# Patient Record
Sex: Male | Born: 1950
Health system: Southern US, Community
[De-identification: ages and names within clinical notes are randomized; demographics above are authoritative.]

## PROBLEM LIST (undated history)

## (undated) DIAGNOSIS — F419 Anxiety disorder, unspecified: Secondary | ICD-10-CM

## (undated) DIAGNOSIS — F32A Depression, unspecified: Secondary | ICD-10-CM

## (undated) DIAGNOSIS — G4700 Insomnia, unspecified: Secondary | ICD-10-CM

## (undated) DIAGNOSIS — B1920 Unspecified viral hepatitis C without hepatic coma: Secondary | ICD-10-CM

## (undated) DIAGNOSIS — S91302A Unspecified open wound, left foot, initial encounter: Secondary | ICD-10-CM

## (undated) DIAGNOSIS — F319 Bipolar disorder, unspecified: Secondary | ICD-10-CM

## (undated) DIAGNOSIS — J189 Pneumonia, unspecified organism: Secondary | ICD-10-CM

## (undated) DIAGNOSIS — L02612 Cutaneous abscess of left foot: Secondary | ICD-10-CM

## (undated) DIAGNOSIS — M1990 Unspecified osteoarthritis, unspecified site: Secondary | ICD-10-CM

## (undated) DIAGNOSIS — M79672 Pain in left foot: Secondary | ICD-10-CM

## (undated) DIAGNOSIS — I10 Essential (primary) hypertension: Secondary | ICD-10-CM

## (undated) DIAGNOSIS — M62838 Other muscle spasm: Secondary | ICD-10-CM

## (undated) DIAGNOSIS — F908 Attention-deficit hyperactivity disorder, other type: Secondary | ICD-10-CM

## (undated) DIAGNOSIS — M009 Pyogenic arthritis, unspecified: Secondary | ICD-10-CM

## (undated) DIAGNOSIS — F329 Major depressive disorder, single episode, unspecified: Secondary | ICD-10-CM

## (undated) DIAGNOSIS — G5793 Unspecified mononeuropathy of bilateral lower limbs: Secondary | ICD-10-CM

## (undated) HISTORY — PX: SINUS EXPLORATION: SHX5214

## (undated) HISTORY — DX: Pain in left foot: M79.672

## (undated) HISTORY — DX: Unspecified open wound, left foot, initial encounter: S91.302A

## (undated) HISTORY — PX: COLONOSCOPY: SHX174

## (undated) HISTORY — DX: Unspecified viral hepatitis C without hepatic coma: B19.20

## (undated) HISTORY — DX: Pyogenic arthritis, unspecified: M00.9

## (undated) HISTORY — DX: Cutaneous abscess of left foot: L02.612

## (undated) HISTORY — DX: Essential (primary) hypertension: I10

---

## 1997-12-28 ENCOUNTER — Ambulatory Visit (HOSPITAL_COMMUNITY): Admission: RE | Admit: 1997-12-28 | Discharge: 1997-12-28 | Payer: Self-pay | Admitting: Gastroenterology

## 1997-12-28 ENCOUNTER — Encounter: Payer: Self-pay | Admitting: Gastroenterology

## 2000-04-08 ENCOUNTER — Ambulatory Visit (HOSPITAL_COMMUNITY): Admission: RE | Admit: 2000-04-08 | Discharge: 2000-04-08 | Payer: Self-pay | Admitting: Internal Medicine

## 2000-04-08 ENCOUNTER — Encounter: Payer: Self-pay | Admitting: Internal Medicine

## 2004-01-21 HISTORY — PX: BACK SURGERY: SHX140

## 2008-11-20 DIAGNOSIS — H04129 Dry eye syndrome of unspecified lacrimal gland: Secondary | ICD-10-CM | POA: Insufficient documentation

## 2010-04-19 DIAGNOSIS — I10 Essential (primary) hypertension: Secondary | ICD-10-CM | POA: Insufficient documentation

## 2010-04-19 DIAGNOSIS — Z8619 Personal history of other infectious and parasitic diseases: Secondary | ICD-10-CM | POA: Insufficient documentation

## 2010-07-12 ENCOUNTER — Encounter (INDEPENDENT_AMBULATORY_CARE_PROVIDER_SITE_OTHER): Payer: Self-pay | Admitting: Surgery

## 2012-05-12 DIAGNOSIS — F1120 Opioid dependence, uncomplicated: Secondary | ICD-10-CM | POA: Insufficient documentation

## 2012-05-12 DIAGNOSIS — M545 Low back pain: Secondary | ICD-10-CM | POA: Insufficient documentation

## 2013-07-05 ENCOUNTER — Other Ambulatory Visit: Payer: Self-pay | Admitting: Nurse Practitioner

## 2013-07-05 DIAGNOSIS — C220 Liver cell carcinoma: Secondary | ICD-10-CM

## 2013-07-12 ENCOUNTER — Ambulatory Visit
Admission: RE | Admit: 2013-07-12 | Discharge: 2013-07-12 | Disposition: A | Discharge status: Home / Self Care | Payer: BC Managed Care – PPO | Source: Ambulatory Visit | Attending: Nurse Practitioner | Admitting: Nurse Practitioner

## 2013-07-12 ENCOUNTER — Encounter (INDEPENDENT_AMBULATORY_CARE_PROVIDER_SITE_OTHER): Payer: Self-pay

## 2013-07-12 DIAGNOSIS — C220 Liver cell carcinoma: Secondary | ICD-10-CM

## 2013-09-14 ENCOUNTER — Encounter: Payer: Self-pay | Admitting: Podiatry

## 2013-09-14 ENCOUNTER — Ambulatory Visit (INDEPENDENT_AMBULATORY_CARE_PROVIDER_SITE_OTHER): Payer: BC Managed Care – PPO | Admitting: Podiatry

## 2013-09-14 VITALS — BP 150/85 | HR 71 | Ht 72.0 in | Wt 234.0 lb

## 2013-09-14 DIAGNOSIS — M86179 Other acute osteomyelitis, unspecified ankle and foot: Secondary | ICD-10-CM

## 2013-09-14 DIAGNOSIS — L02619 Cutaneous abscess of unspecified foot: Secondary | ICD-10-CM | POA: Insufficient documentation

## 2013-09-14 DIAGNOSIS — L03039 Cellulitis of unspecified toe: Principal | ICD-10-CM

## 2013-09-14 NOTE — Patient Instructions (Signed)
Seen for infected toe 2nd left. Running mild fever. Will make referral to Wound care center.  Will start on antibiotics if continues to be feverish.

## 2013-09-14 NOTE — Progress Notes (Signed)
SUBJECTIVE: 63 y.o. year old male presents with red swollen toe 2nd left for duration of 2 months. He does not recall any incident associated with the toe wound. He does not feel any pain or discomfort from it.  He was diagnosed with Neuropathy since 2006 after back surgery. Stated that the surgery took away back pain, but left him with both feet getting numb and tingling, burning and shooting pain.  Stated that he was on antibiotics for 10 days for his toe infection in early July 2015 and the toe made improvement. Then the redness returned a few weeks later. The last medication he took was in early July 2015. Patient relates history of being admitted in hospital in May 2015 because he had symptoms of stroke, but was turned out to be a sinus infection.  At that time he was put on IV antibiotics for two day in May 7- 8 while he was in hospital.  Stated that he did feel feverish two days ago. Today his temperature reading was 99.29F.  REVIEW OF SYSTEMS: Constitutional: negative for Had fever 2 days ago and today's temperature is 99.29F. Eyes: Eyes get red when wakes up in the morning.  Ears, nose, mouth, throat, and face: Sinus problem has came back and taking Flonase and Allegra.  Respiratory: negative Cardiovascular: negative Gastrointestinal: negative Genitourinary:negative Musculoskeletal:Pain at lower back L4-5 and both thumb base joints. Neurological: Peripheral neuropathy following back surgery.   OBJECTIVE: DERMATOLOGIC EXAMINATION: Red swollen toe 2nd left with serosanguinous drainage from a small sinus tract opening at plantar distal end.  VASCULAR EXAMINATION OF LOWER LIMBS: Pedal pulses: All pedal pulses are palpable with normal pulsation.  Red cellulitis limited to 2nd digit left foot.  Mild increase in temperature left foot compared with the right.  NEUROLOGIC EXAMINATION OF THE LOWER LIMBS: Failed response to Monofilament (Semmes-Weinstein 10-gm) sensory testing positive 6 out  of 6, bilateral. Failed Vibratory sensations(128Hz  turning fork) forefoot bilateral.  He takes Neurontin at night to alleviate tingling and pain at bed time.  MUSCULOSKELETAL EXAMINATION: Rectus foot with swollen red toe 2nd digit left. RADIOGRAPHIC FINDINGS: Affected area noted of radiolucent space across the distal phalanx with separated bone fragment at distal end 2nd digit left.   ASSESSMENT: Possible Osteomyelitis 2nd digit distal phalanx left foot.  Cellulitis 2nd digit left.  PLAN: Reviewed findings and available options. Explained the need for IV antibiotics with surgical debridement of infected bone.  Explained that Wound care center might provide him with adequate comprehensive care that he needs at this time.  Patient agreed to follow through with recommendation.  Referral to wound care center initiated.

## 2013-09-16 ENCOUNTER — Ambulatory Visit: Payer: Self-pay | Admitting: Podiatry

## 2013-09-21 ENCOUNTER — Other Ambulatory Visit: Payer: Self-pay | Admitting: Podiatry

## 2013-09-21 ENCOUNTER — Telehealth: Payer: Self-pay | Admitting: *Deleted

## 2013-09-21 MED ORDER — AMOXICILLIN-POT CLAVULANATE 500-125 MG PO TABS: 1.0000 | ORAL_TABLET | Freq: Three times a day (TID) | ORAL | Status: DC

## 2013-09-21 NOTE — Telephone Encounter (Signed)
Patient called this am, he says he has had a on going fever since he was seen in office here on 08/26. He said it did  Get better for a couple days but continues. He also says his Primary took a culture the day before he seen [place] in August but they are no tin this week and he doesn't know what the results were. He said could you give him a antibiotic?

## 2013-10-07 ENCOUNTER — Encounter (HOSPITAL_BASED_OUTPATIENT_CLINIC_OR_DEPARTMENT_OTHER): Payer: BC Managed Care – PPO | Attending: General Surgery

## 2013-10-07 DIAGNOSIS — L02619 Cutaneous abscess of unspecified foot: Secondary | ICD-10-CM | POA: Insufficient documentation

## 2013-10-07 DIAGNOSIS — L97509 Non-pressure chronic ulcer of other part of unspecified foot with unspecified severity: Secondary | ICD-10-CM | POA: Diagnosis not present

## 2013-10-07 DIAGNOSIS — L03039 Cellulitis of unspecified toe: Secondary | ICD-10-CM | POA: Diagnosis not present

## 2013-10-07 DIAGNOSIS — M869 Osteomyelitis, unspecified: Secondary | ICD-10-CM | POA: Insufficient documentation

## 2013-10-08 NOTE — Progress Notes (Signed)
[facility]  NAME:  William Solis, William Solis             ACCOUNT NO.:  [id number]  MEDICAL RECORD NO.:  [id number]      DATE OF BIRTH:  September 05, 1950  PHYSICIAN:  Judene Companion, M.D.      VISIT DATE:  10/07/2013                                  OFFICE VISIT   This is a 63 year old, Caucasian man who has been suffering with a cellulitis and what looks like osteomyelitis of the distal phalanx left second toe.  He has been on antibiotics for about a week and he states that it has been swollen for about 2 months.  He has been diagnosed with neuropathy and that this is probably a neuropathic ulcer.  He does not have diabetes.  He has had a back operation in the past and he says that they felt that this was the reason for his peripheral neuropathy. Today, his blood pressure was 150/70, pulse 70, respirations 16, temperature 98.  He weighs 236 pounds.  I took a culture of the small ulcer on the tip of his toe, and we will make sure that he is on an appropriate antibiotic.  His family doctor has put him on Augmentin and they also did an x-ray which looks to be like he has osteomyelitis of the distal phalanx of his left second toe.  He may be a candidate for a hyperbaric oxygen treatments.  For this time being, we will keep up the Augmentin and will see what the culture shows and we showed him how to change the dressing putting in a wick of iodoform daily after washing it with soap and water.  So, his diagnoses is probable osteomyelitis distal phalanx, left second toe, history of neuropathy.     Judene Companion, M.D.     PP/MEDQ  D:  10/07/2013  T:  10/08/2013  Job:  [id number]

## 2013-10-11 DIAGNOSIS — L97509 Non-pressure chronic ulcer of other part of unspecified foot with unspecified severity: Secondary | ICD-10-CM | POA: Diagnosis not present

## 2013-10-11 DIAGNOSIS — L02619 Cutaneous abscess of unspecified foot: Secondary | ICD-10-CM | POA: Diagnosis not present

## 2013-10-11 DIAGNOSIS — M869 Osteomyelitis, unspecified: Secondary | ICD-10-CM | POA: Diagnosis not present

## 2013-10-11 DIAGNOSIS — L03039 Cellulitis of unspecified toe: Secondary | ICD-10-CM | POA: Diagnosis not present

## 2013-10-19 ENCOUNTER — Inpatient Hospital Stay (HOSPITAL_COMMUNITY)
Admission: EM | Admit: 2013-10-19 | Discharge: 2013-10-24 | DRG: 475 | Disposition: A | Discharge status: Home Health | Payer: BC Managed Care – PPO | Attending: Internal Medicine | Admitting: Internal Medicine

## 2013-10-19 ENCOUNTER — Emergency Department (HOSPITAL_COMMUNITY): Payer: BC Managed Care – PPO

## 2013-10-19 ENCOUNTER — Encounter (HOSPITAL_COMMUNITY): Payer: Self-pay | Admitting: Emergency Medicine

## 2013-10-19 DIAGNOSIS — M86172 Other acute osteomyelitis, left ankle and foot: Secondary | ICD-10-CM | POA: Diagnosis present

## 2013-10-19 DIAGNOSIS — Z87891 Personal history of nicotine dependence: Secondary | ICD-10-CM | POA: Diagnosis not present

## 2013-10-19 DIAGNOSIS — B1920 Unspecified viral hepatitis C without hepatic coma: Secondary | ICD-10-CM | POA: Diagnosis present

## 2013-10-19 DIAGNOSIS — L89622 Pressure ulcer of left heel, stage 2: Secondary | ICD-10-CM | POA: Diagnosis not present

## 2013-10-19 DIAGNOSIS — L89612 Pressure ulcer of right heel, stage 2: Secondary | ICD-10-CM | POA: Diagnosis not present

## 2013-10-19 DIAGNOSIS — Z89422 Acquired absence of other left toe(s): Secondary | ICD-10-CM | POA: Diagnosis not present

## 2013-10-19 DIAGNOSIS — D72829 Elevated white blood cell count, unspecified: Secondary | ICD-10-CM

## 2013-10-19 DIAGNOSIS — G8929 Other chronic pain: Secondary | ICD-10-CM | POA: Diagnosis present

## 2013-10-19 DIAGNOSIS — Z8249 Family history of ischemic heart disease and other diseases of the circulatory system: Secondary | ICD-10-CM | POA: Diagnosis not present

## 2013-10-19 DIAGNOSIS — G629 Polyneuropathy, unspecified: Secondary | ICD-10-CM | POA: Diagnosis present

## 2013-10-19 DIAGNOSIS — M869 Osteomyelitis, unspecified: Secondary | ICD-10-CM

## 2013-10-19 DIAGNOSIS — R7881 Bacteremia: Secondary | ICD-10-CM

## 2013-10-19 DIAGNOSIS — M868X8 Other osteomyelitis, other site: Secondary | ICD-10-CM | POA: Diagnosis not present

## 2013-10-19 DIAGNOSIS — E871 Hypo-osmolality and hyponatremia: Secondary | ICD-10-CM | POA: Diagnosis present

## 2013-10-19 DIAGNOSIS — F909 Attention-deficit hyperactivity disorder, unspecified type: Secondary | ICD-10-CM | POA: Diagnosis present

## 2013-10-19 DIAGNOSIS — M86179 Other acute osteomyelitis, unspecified ankle and foot: Secondary | ICD-10-CM

## 2013-10-19 DIAGNOSIS — L02619 Cutaneous abscess of unspecified foot: Secondary | ICD-10-CM

## 2013-10-19 DIAGNOSIS — B9561 Methicillin susceptible Staphylococcus aureus infection as the cause of diseases classified elsewhere: Secondary | ICD-10-CM | POA: Diagnosis present

## 2013-10-19 DIAGNOSIS — L89894 Pressure ulcer of other site, stage 4: Secondary | ICD-10-CM | POA: Diagnosis not present

## 2013-10-19 DIAGNOSIS — L03032 Cellulitis of left toe: Secondary | ICD-10-CM | POA: Diagnosis present

## 2013-10-19 DIAGNOSIS — L97509 Non-pressure chronic ulcer of other part of unspecified foot with unspecified severity: Secondary | ICD-10-CM | POA: Diagnosis not present

## 2013-10-19 DIAGNOSIS — M79675 Pain in left toe(s): Secondary | ICD-10-CM | POA: Diagnosis present

## 2013-10-19 DIAGNOSIS — L02612 Cutaneous abscess of left foot: Secondary | ICD-10-CM

## 2013-10-19 DIAGNOSIS — E860 Dehydration: Secondary | ICD-10-CM | POA: Diagnosis present

## 2013-10-19 DIAGNOSIS — L03039 Cellulitis of unspecified toe: Secondary | ICD-10-CM

## 2013-10-19 DIAGNOSIS — I10 Essential (primary) hypertension: Secondary | ICD-10-CM | POA: Diagnosis present

## 2013-10-19 DIAGNOSIS — L03116 Cellulitis of left lower limb: Secondary | ICD-10-CM | POA: Diagnosis present

## 2013-10-19 DIAGNOSIS — M549 Dorsalgia, unspecified: Secondary | ICD-10-CM | POA: Diagnosis present

## 2013-10-19 DIAGNOSIS — M86272 Subacute osteomyelitis, left ankle and foot: Secondary | ICD-10-CM | POA: Diagnosis present

## 2013-10-19 DIAGNOSIS — D696 Thrombocytopenia, unspecified: Secondary | ICD-10-CM | POA: Diagnosis present

## 2013-10-19 DIAGNOSIS — F419 Anxiety disorder, unspecified: Secondary | ICD-10-CM | POA: Diagnosis present

## 2013-10-19 DIAGNOSIS — L89154 Pressure ulcer of sacral region, stage 4: Secondary | ICD-10-CM | POA: Diagnosis present

## 2013-10-19 DIAGNOSIS — B958 Unspecified staphylococcus as the cause of diseases classified elsewhere: Secondary | ICD-10-CM | POA: Diagnosis not present

## 2013-10-19 HISTORY — DX: Unspecified mononeuropathy of bilateral lower limbs: G57.93

## 2013-10-19 HISTORY — DX: Attention-deficit hyperactivity disorder, other type: F90.8

## 2013-10-19 LAB — CBC WITH DIFFERENTIAL/PLATELET
BASOS PCT: 0 % (ref 0–1)
Basophils Absolute: 0 10*3/uL (ref 0.0–0.1)
EOS PCT: 2 % (ref 0–5)
Eosinophils Absolute: 0.2 10*3/uL (ref 0.0–0.7)
HEMATOCRIT: 42.9 % (ref 39.0–52.0)
HEMOGLOBIN: 14.7 g/dL (ref 13.0–17.0)
Lymphocytes Relative: 24 % (ref 12–46)
Lymphs Abs: 3 10*3/uL (ref 0.7–4.0)
MCH: 31.3 pg (ref 26.0–34.0)
MCHC: 34.3 g/dL (ref 30.0–36.0)
MCV: 91.5 fL (ref 78.0–100.0)
MONO ABS: 1.9 10*3/uL — AB (ref 0.1–1.0)
MONOS PCT: 15 % — AB (ref 3–12)
NEUTROS ABS: 7.5 10*3/uL (ref 1.7–7.7)
Neutrophils Relative %: 59 % (ref 43–77)
Platelets: 130 10*3/uL — ABNORMAL LOW (ref 150–400)
RBC: 4.69 MIL/uL (ref 4.22–5.81)
RDW: 13.1 % (ref 11.5–15.5)
WBC: 12.7 10*3/uL — ABNORMAL HIGH (ref 4.0–10.5)

## 2013-10-19 LAB — COMPREHENSIVE METABOLIC PANEL
ALBUMIN: 3.2 g/dL — AB (ref 3.5–5.2)
ALT: 89 U/L — ABNORMAL HIGH (ref 0–53)
ANION GAP: 9 (ref 5–15)
AST: 67 U/L — ABNORMAL HIGH (ref 0–37)
Alkaline Phosphatase: 65 U/L (ref 39–117)
BILIRUBIN TOTAL: 1 mg/dL (ref 0.3–1.2)
BUN: 23 mg/dL (ref 6–23)
CALCIUM: 11 mg/dL — AB (ref 8.4–10.5)
CHLORIDE: 97 meq/L (ref 96–112)
CO2: 28 mEq/L (ref 19–32)
CREATININE: 0.98 mg/dL (ref 0.50–1.35)
GFR calc Af Amer: >90 mL/min (ref >90)
GFR calc non Af Amer: 87 mL/min — ABNORMAL LOW (ref >90)
Glucose, Bld: 91 mg/dL (ref 70–99)
Potassium: 4 mEq/L (ref 3.7–5.3)
Sodium: 134 mEq/L — ABNORMAL LOW (ref 137–147)
Total Protein: 7.9 g/dL (ref 6.0–8.3)

## 2013-10-19 LAB — SEDIMENTATION RATE: SED RATE: 30 mm/h — AB (ref 0–16)

## 2013-10-19 LAB — I-STAT CG4 LACTIC ACID, ED: Lactic Acid, Venous: 1.03 mmol/L (ref 0.5–2.2)

## 2013-10-19 MED ORDER — LISINOPRIL 40 MG PO TABS
40.0000 mg | ORAL_TABLET | Freq: Every day | ORAL | Status: DC
  Administered 2013-10-20: 40 mg via ORAL
  Filled 2013-10-19 (×2): qty 1

## 2013-10-19 MED ORDER — CLONAZEPAM 0.5 MG PO TABS: 0.5000 mg | ORAL_TABLET | Freq: Two times a day (BID) | ORAL | Status: DC | PRN

## 2013-10-19 MED ORDER — DOCUSATE SODIUM 100 MG PO CAPS
100.0000 mg | ORAL_CAPSULE | Freq: Two times a day (BID) | ORAL | Status: DC
  Administered 2013-10-19 – 2013-10-24 (×3): 100 mg via ORAL
  Filled 2013-10-19 (×11): qty 1

## 2013-10-19 MED ORDER — AMLODIPINE BESYLATE 10 MG PO TABS
10.0000 mg | ORAL_TABLET | Freq: Every day | ORAL | Status: DC
  Administered 2013-10-20 – 2013-10-24 (×5): 10 mg via ORAL
  Filled 2013-10-19 (×5): qty 1

## 2013-10-19 MED ORDER — HYDROCODONE-ACETAMINOPHEN 5-325 MG PO TABS
1.0000 | ORAL_TABLET | ORAL | Status: DC | PRN
  Administered 2013-10-20: 1 via ORAL
  Administered 2013-10-20: 2 via ORAL
  Filled 2013-10-19 (×3): qty 2

## 2013-10-19 MED ORDER — ACETAMINOPHEN 650 MG RE SUPP: 650.0000 mg | Freq: Four times a day (QID) | RECTAL | Status: DC | PRN

## 2013-10-19 MED ORDER — ACETAMINOPHEN 325 MG PO TABS: 650.0000 mg | ORAL_TABLET | Freq: Four times a day (QID) | ORAL | Status: DC | PRN

## 2013-10-19 MED ORDER — METOPROLOL TARTRATE 50 MG PO TABS
50.0000 mg | ORAL_TABLET | Freq: Four times a day (QID) | ORAL | Status: DC
  Administered 2013-10-19 – 2013-10-20 (×4): 50 mg via ORAL
  Filled 2013-10-19 (×9): qty 1

## 2013-10-19 MED ORDER — ONDANSETRON HCL 4 MG PO TABS: 4.0000 mg | ORAL_TABLET | Freq: Four times a day (QID) | ORAL | Status: DC | PRN

## 2013-10-19 MED ORDER — MELATONIN 5 MG PO TABS: 0.5000 | ORAL_TABLET | Freq: Every evening | ORAL | Status: DC | PRN

## 2013-10-19 MED ORDER — ESCITALOPRAM OXALATE 20 MG PO TABS
30.0000 mg | ORAL_TABLET | Freq: Every day | ORAL | Status: DC
  Administered 2013-10-20 – 2013-10-24 (×5): 30 mg via ORAL
  Filled 2013-10-19 (×5): qty 1

## 2013-10-19 MED ORDER — SODIUM CHLORIDE 0.9 % IV SOLN
INTRAVENOUS | Status: AC
  Administered 2013-10-19: 75 mL/h via INTRAVENOUS

## 2013-10-19 MED ORDER — VANCOMYCIN HCL 10 G IV SOLR
1500.0000 mg | Freq: Once | INTRAVENOUS | Status: AC
  Administered 2013-10-20: 1500 mg via INTRAVENOUS
  Filled 2013-10-19: qty 1500

## 2013-10-19 MED ORDER — GABAPENTIN 300 MG PO CAPS
600.0000 mg | ORAL_CAPSULE | Freq: Every evening | ORAL | Status: DC | PRN
  Filled 2013-10-19: qty 2

## 2013-10-19 MED ORDER — ONDANSETRON HCL 4 MG/2ML IJ SOLN: 4.0000 mg | Freq: Four times a day (QID) | INTRAMUSCULAR | Status: DC | PRN

## 2013-10-19 MED ORDER — CLINDAMYCIN PHOSPHATE 600 MG/50ML IV SOLN
600.0000 mg | Freq: Once | INTRAVENOUS | Status: AC
  Administered 2013-10-19: 600 mg via INTRAVENOUS
  Filled 2013-10-19: qty 50

## 2013-10-19 MED ORDER — OXYCODONE HCL 5 MG PO TABS: 10.0000 mg | ORAL_TABLET | ORAL | Status: DC | PRN

## 2013-10-19 MED ORDER — METHYLPHENIDATE HCL 5 MG PO TABS
20.0000 mg | ORAL_TABLET | Freq: Every day | ORAL | Status: DC
  Administered 2013-10-20 – 2013-10-23 (×3): 20 mg via ORAL
  Filled 2013-10-19 (×5): qty 4

## 2013-10-19 NOTE — ED Provider Notes (Signed)
Medical screening examination/treatment/procedure(s) were performed by non-physician practitioner and as supervising physician I was immediately available for consultation/collaboration.  Leota Jacobsen, MD 10/19/13 [phone]

## 2013-10-19 NOTE — Progress Notes (Signed)
PHARMACIST - PHYSICIAN ORDER COMMUNICATION  CONCERNING: P&T Medication Policy on Herbal Medications  DESCRIPTION:  This patient's order for:  Melatonin  has been noted.  This product(s) is classified as an "herbal" or natural product. Due to a lack of definitive safety studies or FDA approval, nonstandard manufacturing practices, plus the potential risk of unknown drug-drug interactions while on inpatient medications, the Pharmacy and Therapeutics Committee does not permit the use of "herbal" or natural products of this type within [facility].   ACTION TAKEN: The pharmacy department is unable to verify this order at this time and your patient has been informed of this safety policy. Please reevaluate patient's clinical condition at discharge and address if the herbal or natural product(s) should be resumed at that time.  Thank you,  Minda Ditto PharmD Pager [phone] 10/19/2013, 9:38 PM

## 2013-10-19 NOTE — H&P (Signed)
PCP:   Leamon Arnt, MD    Chief Complaint:  Left foot pain and swellng  HPI: William Solis is a 63 y.o. male   has a past medical history of Hypertension; Hepatitis C; Neuropathy involving both lower extremities; and ADHD, adult residual type.   Presented with  3 month hx of left second toe ongoing infection. Patient have been under care of podiatrist he has been prescribe a course of Augment which seemed to control the infection. Patient has been told that he has osteomyelitis.  Augmentin has been stopped on 9/21 in order to obtain a culture. He was seen in the office today by Dr. Jerline Pain and was sent to ER to get IV antibiotics and orthopedics consult. Patient reports low grade fever 100.3. and fatigue. The swelling started to spread up the foot. In ER he received clindamycin IV. Plain films confirmed osteomyelitis.  Of note patient has bilateral neuropathy as a complication of back surgery done 9 years ago at [facility], he has no sensation in his feet. He continued to ambulate and drive.  Cultures done on 09/13/13 showed MSSA  Hospitalist was called for admission for  Osteomyelitis of 2nd toe on left foot.   Review of Systems:    Pertinent positives include:  Fevers, chills, fatigue  Constitutional:  No weight loss, night sweats, weight loss  HEENT:  No headaches, Difficulty swallowing,Tooth/dental problems,Sore throat,  No sneezing, itching, ear ache, nasal congestion, post nasal drip,  Cardio-vascular:  No chest pain, Orthopnea, PND, anasarca, dizziness, palpitations.no Bilateral lower extremity swelling  GI:  No heartburn, indigestion, abdominal pain, nausea, vomiting, diarrhea, change in bowel habits, loss of appetite, melena, blood in stool, hematemesis Resp:  no shortness of breath at rest. No dyspnea on exertion, No excess mucus, no productive cough, No non-productive cough, No coughing up of blood.No change in color of mucus.No wheezing. Skin:  no rash or lesions.  No jaundice GU:  no dysuria, change in color of urine, no urgency or frequency. No straining to urinate.  No flank pain.  Musculoskeletal:  No joint pain or no joint swelling. No decreased range of motion. No back pain.  Psych:  No change in mood or affect. No depression or anxiety. No memory loss.  Neuro: no localizing neurological complaints, no tingling, no weakness, no double vision, no gait abnormality, no slurred speech, no confusion  Otherwise ROS are negative except for above, 10 systems were reviewed  Past Medical History: Past Medical History  Diagnosis Date  . Hypertension   . Hepatitis C   . Neuropathy involving both lower extremities   . ADHD, adult residual type    Past Surgical History  Procedure Laterality Date  . Back surgery  2006    Disc  . Sinus exploration  2005/2007     Medications: Prior to Admission medications   Medication Sig Start Date End Date Taking? Authorizing Provider  amLODipine (NORVASC) 10 MG tablet Take 10 mg by mouth daily.   Yes Historical Provider, MD  amoxicillin-clavulanate (AUGMENTIN) 500-125 MG per tablet Take 1 tablet (500 mg total) by mouth 3 (three) times daily. 09/21/13  Yes Myeong Sheard, DPM  Artificial Tear Ointment (ARTIFICIAL TEARS) ointment Place 1 drop into both eyes as needed (dry eyes).   Yes Historical Provider, MD  Ascorbic Acid (VITAMIN C) 1000 MG tablet Take 1,000 mg by mouth daily.   Yes Historical Provider, MD  clonazePAM (KLONOPIN) 0.5 MG tablet Take 0.5 mg by mouth 2 (two) times daily as  needed for anxiety.   Yes Historical Provider, MD  cyclobenzaprine (FLEXERIL) 10 MG tablet Take 10 mg by mouth 3 (three) times daily as needed for muscle spasms.   Yes Historical Provider, MD  escitalopram (LEXAPRO) 20 MG tablet Take 30 mg by mouth daily.   Yes Historical Provider, MD  fexofenadine (ALLEGRA) 180 MG tablet Take 180 mg by mouth daily.   Yes Historical Provider, MD  fluticasone (FLONASE) 50 MCG/ACT nasal spray Place 1  spray into both nostrils daily.   Yes Historical Provider, MD  gabapentin (NEURONTIN) 600 MG tablet Take 600 mg by mouth at bedtime as needed (pain).    Yes Historical Provider, MD  hydrochlorothiazide (HYDRODIURIL) 25 MG tablet Take 25 mg by mouth daily.   Yes Historical Provider, MD  lisinopril (PRINIVIL,ZESTRIL) 40 MG tablet Take 40 mg by mouth daily.   Yes Historical Provider, MD  Melatonin 5 MG TABS Take 0.5 tablets by mouth at bedtime.    Yes Historical Provider, MD  meloxicam (MOBIC) 7.5 MG tablet Take 7.5 mg by mouth daily.   Yes Historical Provider, MD  methylphenidate (RITALIN) 20 MG tablet Take 20 mg by mouth daily.   Yes Historical Provider, MD  metoprolol (LOPRESSOR) 50 MG tablet Take 50 mg by mouth 4 (four) times daily.   Yes Historical Provider, MD  Multiple Vitamins-Minerals (MULTIVITAMIN WITH MINERALS) tablet Take 1 tablet by mouth daily.   Yes Historical Provider, MD  Omega-3 Fatty Acids (FISH OIL PO) Take 1,200 mg by mouth 2 (two) times daily.    Yes Historical Provider, MD  oxycodone (OXY-IR) 5 MG capsule Take 10 mg by mouth every 4 (four) hours as needed for pain.   Yes Historical Provider, MD    Allergies:  No Known Allergies  Social History:  Ambulatory   independently   Lives at home  With family     reports that he quit smoking about 23 years ago. His smoking use included Cigarettes and Cigars. He smoked 0.00 packs per day. He has never used smokeless tobacco. He reports that he does not drink alcohol or use illicit drugs.    Family History: family history includes Hypertension in his father and mother. There is no history of Diabetes type II.    Physical Exam: Patient Vitals for the past 24 hrs:  BP Temp Temp src Pulse Resp SpO2  10/19/13 1817 - 99.8 F (37.7 C) Rectal - - -  10/19/13 1712 154/72 mmHg 98.3 F (36.8 C) Oral 78 16 96 %  10/19/13 1444 126/80 mmHg 98.5 F (36.9 C) Oral 75 17 92 %    1. General:  in No Acute distress 2. Psychological:  Alert and  Oriented 3. Head/ENT:   Moist   Mucous Membranes                          Head Non traumatic, neck supple                          Normal   Dentition 4. SKIN: normal   Skin turgor,  Skin clean redness and purulent discharge from 2 nd great toe 5. Heart: Regular rate and rhythm no Murmur, Rub or gallop 6. Lungs: Clear to auscultation bilaterally, no wheezes or crackles   7. Abdomen: Soft, non-tender, Non distended 8. Lower extremities: no clubbing, cyanosis, or edema 9. Neurologically Grossly intact, moving all 4 extremities equally 10. MSK: Normal range of motion  body mass index is unknown because there is no weight on file.   Labs on Admission:   Recent Labs  10/19/13 1747  NA 134*  K 4.0  CL 97  CO2 28  GLUCOSE 91  BUN 23  CREATININE 0.98  CALCIUM 11.0*    Recent Labs  10/19/13 1747  AST 67*  ALT 89*  ALKPHOS 65  BILITOT 1.0  PROT 7.9  ALBUMIN 3.2*   No results found for this basename: LIPASE, AMYLASE,  in the last 72 hours  Recent Labs  10/19/13 1747  WBC 12.7*  NEUTROABS 7.5  HGB 14.7  HCT 42.9  MCV 91.5  PLT 130*   No results found for this basename: CKTOTAL, CKMB, CKMBINDEX, TROPONINI,  in the last 72 hours No results found for this basename: TSH, T4TOTAL, FREET3, T3FREE, THYROIDAB,  in the last 72 hours No results found for this basename: VITAMINB12, FOLATE, FERRITIN, TIBC, IRON, RETICCTPCT,  in the last 72 hours No results found for this basename: HGBA1C    The CrCl is unknown because both a height and weight (above a minimum accepted value) are required for this calculation. ABG No results found for this basename: phart, pco2, po2, hco3, tco2, acidbasedef, o2sat     No results found for this basename: DDIMER    Sed Rate 30  Other results:  I have pearsonaly reviewed this:  BNP (last 3 results) No results found for this basename: PROBNP,  in the last 8760 hours  There were no vitals filed for this visit.  Cultures: No  results found for this basename: sdes, specrequest, cult, reptstatus    Radiological Exams on Admission: Dg Toe 2nd Left  10/19/2013   CLINICAL DATA:  Left second toe wound infection for 3 months.  EXAM: LEFT SECOND TOE  COMPARISON:  None.  FINDINGS: The distal phalanx of the second toe is completely destroyed by infection and there is extensive destructive changes involving the middle phalanx also. No definite findings for septic arthritis at the PIP joint. Diffuse and marked soft tissue swelling of the second digit is demonstrated. I do not see any definite air in the soft tissues.  IMPRESSION: Advanced and aggressive osteomyelitis involving the middle and distal phalanges of the second toe.   Electronically Signed   By: Kalman Jewels M.D.   On: 10/19/2013 18:14    Chart has been reviewed  Assessment/Plan  63 year old gentleman with history of neuropathy involving feet bilaterally and hypertension presents with osteomyelitis of the left second toe with evidence of cellulitis and drainage of purulent material worrisome for abscess.  Present on Admission:  . Cellulitis and abscess of toe - start IV antibiotics IV vancomycin, patient already received antibiotics in emergency department. In the past he grew MSSA. The patient have had exposure to medical system since  So will cover for MRSA  . Osteomyelitis of toe of left foot - likely require amputation.  discussed case with orthopedics, NPO post-midnight, IV antibiotics  . hypertension - continue home medications but hold hydrochlorothiazide History of hepatitis C check LFTs and INR noted somewhat low platelet count. We'll repeat.   Prophylaxis: SCD , Protonix  CODE STATUS:  FULL CODE  Other plan as per orders.  I have spent a total of 65 min on this admission - extra time was taken to discuss case with orthopedics, Dr Marcelino Scot has been consulted will see patient in AM.  Gia Lusher 10/19/2013, 7:36 PM  [facility]  Pager  [phone]   If 7AM-7PM,  please contact the day team taking care of the patient  [email or web address]  Password [email or web address]

## 2013-10-19 NOTE — ED Provider Notes (Signed)
CSN: [id number]     Arrival date & time 10/19/13  1433 History   First MD Initiated Contact with Patient 10/19/13 1704     Chief Complaint  Patient presents with  . Wound Infection     (Consider location/radiation/quality/duration/timing/severity/associated sxs/prior Treatment) HPI  William Solis is a 63 y.o. male  with a hx of Hep C, HTN presents to the Emergency Department complaining of gradual, persistent, progressively worsening infection and associated abscess of the Left 2nd toe onset 3 mos ago worsening in the last several weeks.  Pt reports he took 1 course of Augmentin completed 2 weeks ago. Pt reports wound culture grew staph, non MRSA type and he was not placed back on another antibiotic.  Pt reports Hx of peripheral neuropathy and therefore didn't notice the wound.   Pt reports he sees Dr. Caffie Pinto at [facility] - podiatrist.  Associated symptoms include fever (100.3 last night), chills, redness, pain in the foot.  Nothing makes it better and nothing makes it worse.  Pt denies chills, headache, abd pain, N/V, diarrhea, weakness, dizziness, syncope.    PCP: Donnetta Hutching   Past Medical History  Diagnosis Date  . Hypertension   . Hepatitis C   . Neuropathy involving both lower extremities   . ADHD, adult residual type    Past Surgical History  Procedure Laterality Date  . Back surgery  2006    Disc  . Sinus exploration  2005/2007   Family History  Problem Relation Age of Onset  . Hypertension Mother   . Hypertension Father   . Diabetes type II Neg Hx    History  Substance Use Topics  . Smoking status: Former Smoker    Types: Cigarettes, Cigars    Quit date: 10/20/1990  . Smokeless tobacco: Never Used  . Alcohol Use: No    Review of Systems  Constitutional: Positive for fever and fatigue. Negative for diaphoresis, appetite change and unexpected weight change.  HENT: Negative for mouth sores.   Eyes: Negative for visual disturbance.  Respiratory: Negative  for cough, chest tightness, shortness of breath and wheezing.   Cardiovascular: Negative for chest pain.  Gastrointestinal: Negative for nausea, vomiting, abdominal pain, diarrhea and constipation.  Endocrine: Negative for polydipsia, polyphagia and polyuria.  Genitourinary: Negative for dysuria, urgency, frequency and hematuria.  Musculoskeletal: Positive for joint swelling. Negative for back pain and neck stiffness.  Skin: Positive for color change and wound. Negative for rash.  Allergic/Immunologic: Negative for immunocompromised state.  Neurological: Negative for syncope, light-headedness and headaches.  Hematological: Does not bruise/bleed easily.  Psychiatric/Behavioral: Negative for sleep disturbance. The patient is not nervous/anxious.       Allergies  Review of patient's allergies indicates no known allergies.  Home Medications   Prior to Admission medications   Medication Sig Start Date End Date Taking? Authorizing Provider  amLODipine (NORVASC) 10 MG tablet Take 10 mg by mouth daily.   Yes Historical Provider, MD  amoxicillin-clavulanate (AUGMENTIN) 500-125 MG per tablet Take 1 tablet (500 mg total) by mouth 3 (three) times daily. 09/21/13  Yes Myeong Sheard, DPM  Artificial Tear Ointment (ARTIFICIAL TEARS) ointment Place 1 drop into both eyes as needed (dry eyes).   Yes Historical Provider, MD  Ascorbic Acid (VITAMIN C) 1000 MG tablet Take 1,000 mg by mouth daily.   Yes Historical Provider, MD  clonazePAM (KLONOPIN) 0.5 MG tablet Take 0.5 mg by mouth 2 (two) times daily as needed for anxiety.   Yes Historical Provider,  MD  cyclobenzaprine (FLEXERIL) 10 MG tablet Take 10 mg by mouth 3 (three) times daily as needed for muscle spasms.   Yes Historical Provider, MD  escitalopram (LEXAPRO) 20 MG tablet Take 30 mg by mouth daily.   Yes Historical Provider, MD  fexofenadine (ALLEGRA) 180 MG tablet Take 180 mg by mouth daily.   Yes Historical Provider, MD  fluticasone (FLONASE) 50  MCG/ACT nasal spray Place 1 spray into both nostrils daily.   Yes Historical Provider, MD  gabapentin (NEURONTIN) 600 MG tablet Take 600 mg by mouth at bedtime as needed (pain).    Yes Historical Provider, MD  hydrochlorothiazide (HYDRODIURIL) 25 MG tablet Take 25 mg by mouth daily.   Yes Historical Provider, MD  lisinopril (PRINIVIL,ZESTRIL) 40 MG tablet Take 40 mg by mouth daily.   Yes Historical Provider, MD  Melatonin 5 MG TABS Take 0.5 tablets by mouth at bedtime.    Yes Historical Provider, MD  meloxicam (MOBIC) 7.5 MG tablet Take 7.5 mg by mouth daily.   Yes Historical Provider, MD  methylphenidate (RITALIN) 20 MG tablet Take 20 mg by mouth daily.   Yes Historical Provider, MD  metoprolol (LOPRESSOR) 50 MG tablet Take 50 mg by mouth 4 (four) times daily.   Yes Historical Provider, MD  Multiple Vitamins-Minerals (MULTIVITAMIN WITH MINERALS) tablet Take 1 tablet by mouth daily.   Yes Historical Provider, MD  Omega-3 Fatty Acids (FISH OIL PO) Take 1,200 mg by mouth 2 (two) times daily.    Yes Historical Provider, MD  oxycodone (OXY-IR) 5 MG capsule Take 10 mg by mouth every 4 (four) hours as needed for pain.   Yes Historical Provider, MD   BP 154/72  Pulse 78  Temp(Src) 99.8 F (37.7 C) (Rectal)  Resp 16  SpO2 96% Physical Exam  Nursing note and vitals reviewed. Constitutional: He is oriented to person, place, and time. He appears well-developed and well-nourished. No distress.  Awake, alert, nontoxic appearance  HENT:  Head: Normocephalic and atraumatic.  Mouth/Throat: Oropharynx is clear and moist. No oropharyngeal exudate.  Eyes: Conjunctivae are normal. No scleral icterus.  Neck: Normal range of motion. Neck supple.  Cardiovascular: Normal rate, regular rhythm, normal heart sounds and intact distal pulses.   No murmur heard. Pulmonary/Chest: Effort normal and breath sounds normal. No respiratory distress. He has no wheezes.  Equal chest expansion  Abdominal: Soft. Bowel sounds  are normal. He exhibits no distension and no mass. There is no tenderness. There is no rebound and no guarding.  Musculoskeletal: Normal range of motion. He exhibits no edema.  Lymphadenopathy:    He has no cervical adenopathy.  Neurological: He is alert and oriented to person, place, and time.  Speech is clear and goal oriented Moves extremities without ataxia  Skin: Skin is warm and dry. He is not diaphoretic. There is erythema.  Swelling, erythema and abscess collection, several open wounds and drainage to the left 2nd toe with TTP; streaking extends to the proximal calf of the left leg  Psychiatric: He has a normal mood and affect.    ED Course  Procedures (including critical care time) Labs Review Labs Reviewed  CBC WITH DIFFERENTIAL - Abnormal; Notable for the following:    WBC 12.7 (*)    Platelets 130 (*)    Monocytes Relative 15 (*)    Monocytes Absolute 1.9 (*)    All other components within normal limits  COMPREHENSIVE METABOLIC PANEL - Abnormal; Notable for the following:    Sodium 134 (*)  Calcium 11.0 (*)    Albumin 3.2 (*)    AST 67 (*)    ALT 89 (*)    GFR calc non Af Amer 87 (*)    All other components within normal limits  SEDIMENTATION RATE - Abnormal; Notable for the following:    Sed Rate 30 (*)    All other components within normal limits  WOUND CULTURE  CULTURE, BLOOD (ROUTINE X 2)  CULTURE, BLOOD (ROUTINE X 2)  I-STAT CG4 LACTIC ACID, ED    Imaging Review Dg Toe 2nd Left  10/19/2013   CLINICAL DATA:  Left second toe wound infection for 3 months.  EXAM: LEFT SECOND TOE  COMPARISON:  None.  FINDINGS: The distal phalanx of the second toe is completely destroyed by infection and there is extensive destructive changes involving the middle phalanx also. No definite findings for septic arthritis at the PIP joint. Diffuse and marked soft tissue swelling of the second digit is demonstrated. I do not see any definite air in the soft tissues.  IMPRESSION:  Advanced and aggressive osteomyelitis involving the middle and distal phalanges of the second toe.   Electronically Signed   By: Kalman Jewels M.D.   On: 10/19/2013 18:14     EKG Interpretation None            MDM   Final diagnoses:  Cellulitis and abscess of toe, left   William Solis presents with several months of slowing worsening infection in the left 2nd toe. Patient today with abscess the left second toe, concern for possible osteomyelitis. Will obtain labs, wound culture and blood cultures.  6:45PM Pt needed the sepsis criteria. No elevation in his lactic acid. Mild leukocytosis at 12.7.  X-ray with evidence of osteomyelitis. Sedimentation rate pending. We'll proceed with admission. Patient given clindamycin IV here in the emergency department.  7:26 PM Discussed with Dr. Darnell Level who will admit to med-surg.  Elevated sedimentation rate.    The patient was discussed with and seen by Dr. Zenia Resides who agrees with the treatment plan.  BP 154/72  Pulse 78  Temp(Src) 99.8 F (37.7 C) (Rectal)  Resp 16  SpO2 96%       Abigail Butts, PA-C 10/19/13 2021

## 2013-10-19 NOTE — ED Notes (Signed)
Pt has neuropathy and has had toe infection of lt foot since may.  Pt here for IV abx.

## 2013-10-20 ENCOUNTER — Other Ambulatory Visit (HOSPITAL_COMMUNITY): Payer: Self-pay | Admitting: Orthopedic Surgery

## 2013-10-20 DIAGNOSIS — L03032 Cellulitis of left toe: Secondary | ICD-10-CM

## 2013-10-20 DIAGNOSIS — M869 Osteomyelitis, unspecified: Secondary | ICD-10-CM

## 2013-10-20 DIAGNOSIS — L02612 Cutaneous abscess of left foot: Secondary | ICD-10-CM

## 2013-10-20 DIAGNOSIS — I10 Essential (primary) hypertension: Secondary | ICD-10-CM

## 2013-10-20 DIAGNOSIS — D72829 Elevated white blood cell count, unspecified: Secondary | ICD-10-CM

## 2013-10-20 LAB — COMPREHENSIVE METABOLIC PANEL
ALT: 78 U/L — AB (ref 0–53)
AST: 61 U/L — AB (ref 0–37)
Albumin: 2.8 g/dL — ABNORMAL LOW (ref 3.5–5.2)
Alkaline Phosphatase: 62 U/L (ref 39–117)
Anion gap: 9 (ref 5–15)
BUN: 23 mg/dL (ref 6–23)
CALCIUM: 10.7 mg/dL — AB (ref 8.4–10.5)
CO2: 27 mEq/L (ref 19–32)
CREATININE: 0.9 mg/dL (ref 0.50–1.35)
Chloride: 104 mEq/L (ref 96–112)
GFR calc Af Amer: >90 mL/min (ref >90)
Glucose, Bld: 99 mg/dL (ref 70–99)
Potassium: 3.9 mEq/L (ref 3.7–5.3)
SODIUM: 140 meq/L (ref 137–147)
TOTAL PROTEIN: 7.1 g/dL (ref 6.0–8.3)
Total Bilirubin: 0.8 mg/dL (ref 0.3–1.2)

## 2013-10-20 LAB — TSH: TSH: 2.71 u[IU]/mL (ref 0.350–4.500)

## 2013-10-20 LAB — CBC
HCT: 42.1 % (ref 39.0–52.0)
HEMOGLOBIN: 14.3 g/dL (ref 13.0–17.0)
MCH: 30.6 pg (ref 26.0–34.0)
MCHC: 34 g/dL (ref 30.0–36.0)
MCV: 90.1 fL (ref 78.0–100.0)
PLATELETS: 124 10*3/uL — AB (ref 150–400)
RBC: 4.67 MIL/uL (ref 4.22–5.81)
RDW: 13 % (ref 11.5–15.5)
WBC: 7.8 10*3/uL (ref 4.0–10.5)

## 2013-10-20 LAB — PROTIME-INR
INR: 1.08 (ref 0.00–1.49)
Prothrombin Time: 14 seconds (ref 11.6–15.2)

## 2013-10-20 LAB — PHOSPHORUS: Phosphorus: 2 mg/dL — ABNORMAL LOW (ref 2.3–4.6)

## 2013-10-20 LAB — TYPE AND SCREEN
ABO/RH(D): B POS
ANTIBODY SCREEN: NEGATIVE

## 2013-10-20 LAB — ABO/RH: ABO/RH(D): B POS

## 2013-10-20 LAB — MAGNESIUM: Magnesium: 1.8 mg/dL (ref 1.5–2.5)

## 2013-10-20 MED ORDER — OXYCODONE HCL 5 MG PO TABS
10.0000 mg | ORAL_TABLET | ORAL | Status: DC | PRN
  Administered 2013-10-20 – 2013-10-21 (×2): 10 mg via ORAL
  Filled 2013-10-20 (×2): qty 2

## 2013-10-20 MED ORDER — VANCOMYCIN HCL IN DEXTROSE 1-5 GM/200ML-% IV SOLN
1000.0000 mg | Freq: Two times a day (BID) | INTRAVENOUS | Status: DC
  Administered 2013-10-20 – 2013-10-22 (×5): 1000 mg via INTRAVENOUS
  Filled 2013-10-20 (×7): qty 200

## 2013-10-20 MED ORDER — CHLORHEXIDINE GLUCONATE 4 % EX LIQD
60.0000 mL | Freq: Once | CUTANEOUS | Status: DC
  Filled 2013-10-20: qty 60

## 2013-10-20 NOTE — Anesthesia Preprocedure Evaluation (Signed)
Anesthesia Evaluation  Patient identified by MRN, date of birth, ID band Patient awake    Reviewed: Allergy & Precautions, H&P , NPO status , Patient's Chart, lab work & pertinent test results  History of Anesthesia Complications Negative for: history of anesthetic complications  Airway Mallampati: II TM Distance: >3 FB Neck ROM: Full    Dental no notable dental hx.    Pulmonary former smoker,  breath sounds clear to auscultation  Pulmonary exam normal       Cardiovascular Exercise Tolerance: Good hypertension, Pt. on medications and Pt. on home beta blockers Rhythm:Regular Rate:Normal     Neuro/Psych PSYCHIATRIC DISORDERS Anxiety Depression  Neuromuscular disease    GI/Hepatic negative GI ROS, (+) Hepatitis -, C  Endo/Other  negative endocrine ROS  Renal/GU negative Renal ROS  negative genitourinary   Musculoskeletal negative musculoskeletal ROS (+)   Abdominal   Peds negative pediatric ROS (+)  Hematology negative hematology ROS (+)   Anesthesia Other Findings   Reproductive/Obstetrics negative OB ROS                           Anesthesia Physical Anesthesia Plan  ASA: III  Anesthesia Plan: General   Post-op Pain Management:    Induction: Intravenous  Airway Management Planned: LMA  Additional Equipment:   Intra-op Plan:   Post-operative Plan: Extubation in OR  Informed Consent: I have reviewed the patients History and Physical, chart, labs and discussed the procedure including the risks, benefits and alternatives for the proposed anesthesia with the patient or authorized representative who has indicated his/her understanding and acceptance.   Dental advisory given  Plan Discussed with: CRNA  Anesthesia Plan Comments:         Anesthesia Quick Evaluation

## 2013-10-20 NOTE — Progress Notes (Signed)
Report called to RN at [facility] who is receiving patient.  Pt aware of transfer.  Pt to have surgery tomorrow at [facility].

## 2013-10-20 NOTE — Progress Notes (Signed)
Patient ID: William Solis, male   DOB: [birth year], 64 y.o.   MRN: [id number] [facility] PROGRESS NOTE  William Solis BOF:[id number] DOB: 10/08/1950 DOA: 10/19/2013 PCP: Leamon Arnt, MD  Brief narrative: 63 y.o. male with past medical history of hypertension, hepatitis C, neuropathy, ADHD who presented to [facility] ED 10/19/2013 with ongoing left second toe infection for past 3 months prior to this admission. Patient was on Augmentin given to him by podiatrist and he has completed the treatment with it 10/10/2013. He saw Dr. Jerline Pain on the day of this admission and was referred to ED for further treatment with IV antibiotics ad need for orthopedic consultation. Of note, cultures done on 09/13/13 showed MSSA. On this admission, patient had x ray which evealed advanced and aggressive osteomyelitis involving the middle and distal phalanges of the second toe.  Assessment/Plan:    Principal Problem: Acute progressive osteomyelitis of the left second toe / Cellulitis / Leukocytosis   Continue vancomycin started on admission.  Appreciate orthopedic surgery consult and recommendations.  May continue IV fluids for now since patient is NPO prior to potential surgery today,  Pain management efforts: oxycodone 10 mg PO every 4 hours PRN moderate to severe pain. Active Problems: Hypertension  Resumed Norvasc 10 mg daily, lisinopril 40 mg daily and metoprolol 50 mg PO QID.  Renal function stable but would have low threshold for stopping lisinopril since patient will likely require long term vanco for osteomyelitis and both lisinopril and vanco have nephrotoxic potential. Hypercalcemia  Likely due to possible dehydration, acute infection  Continue IV fluids.  Follow up BMP in the morning.  Hyponatremia  Likely dehydration due to acute infection  Sodium normalized with IV fluids. Thrombocytopenia  Mild, platelet count 130, 124.   No signs of bleed.  Using SCD's for DVT  prophylaxis.  DVT Prophylaxis   SCD's bilaterally   Code Status: Full.  Family Communication:  plan of care discussed with the patient Disposition Plan: Home when stable.    IV Access:   Peripheral IV  Procedures and diagnostic studies:    Dg Toe 2nd Left 10/19/2013    Advanced and aggressive osteomyelitis involving the middle and distal phalanges of the second toe.    Medical Consultants:   None   Other Consultants:   None   Anti-Infectives:   Vancomycin 10/19/2013 -->   Leisa Lenz, MD  [facility] Pager [phone]  If 7PM-7AM, please contact night-coverage [email or web address] Password [facility] 10/20/2013, 6:47 AM   LOS: 1 day    HPI/Subjective: No acute overnight events.  Objective: Filed Vitals:   10/19/13 2022 10/19/13 2230 10/20/13 0511 10/20/13 0519  BP: 133/86 132/74 129/76   Pulse: 75 80 66   Temp: 98.4 F (36.9 C) 98.7 F (37.1 C) 98.1 F (36.7 C)   TempSrc: Oral Oral Oral   Resp: 16 18 16    Height:    6' (1.829 m)  Weight:    102.059 kg (225 lb)  SpO2: 99% 98% 99%     Intake/Output Summary (Last 24 hours) at 10/20/13 0647 Last data filed at 10/20/13 0200  Gross per 24 hour  Intake  752.5 ml  Output    700 ml  Net   52.5 ml    Exam:   General:  Pt is alert, follows commands appropriately, not in acute distress  Cardiovascular: Regular rate and rhythm, S1/S2, no murmurs  Respiratory: Clear to auscultation bilaterally, no wheezing, no crackles, no rhonchi  Abdomen: Soft, non tender, non distended,  bowel sounds present  Extremities: Left second toe discoloration with some dry blood, cellulitis extends to upper calf muscle area  Neuro: Grossly nonfocal  Data Reviewed: Basic Metabolic Panel:  Recent Labs Lab 10/19/13 1747 10/20/13 0505  NA 134* 140  K 4.0 3.9  CL 97 104  CO2 28 27  GLUCOSE 91 99  BUN 23 23  CREATININE 0.98 0.90  CALCIUM 11.0* 10.7*  MG  --  1.8  PHOS  --  2.0*   Liver Function Tests:  Recent Labs Lab  10/19/13 1747 10/20/13 0505  AST 67* 61*  ALT 89* 78*  ALKPHOS 65 62  BILITOT 1.0 0.8  PROT 7.9 7.1  ALBUMIN 3.2* 2.8*   No results found for this basename: LIPASE, AMYLASE,  in the last 168 hours No results found for this basename: AMMONIA,  in the last 168 hours CBC:  Recent Labs Lab 10/19/13 1747 10/20/13 0505  WBC 12.7* 7.8  NEUTROABS 7.5  --   HGB 14.7 14.3  HCT 42.9 42.1  MCV 91.5 90.1  PLT 130* 124*   Cardiac Enzymes: No results found for this basename: CKTOTAL, CKMB, CKMBINDEX, TROPONINI,  in the last 168 hours BNP: No components found with this basename: POCBNP,  CBG: No results found for this basename: GLUCAP,  in the last 168 hours  No results found for this or any previous visit (from the past 240 hour(s)).   Scheduled Meds: . amLODipine  10 mg Oral Daily  . docusate sodium  100 mg Oral BID  . escitalopram  30 mg Oral Daily  . lisinopril  40 mg Oral Daily  . methylphenidate  20 mg Oral QAC breakfast  . metoprolol  50 mg Oral QID  . vancomycin  1,000 mg Intravenous Q12H   Continuous Infusions: . sodium chloride 75 mL/hr (10/19/13 2238)

## 2013-10-20 NOTE — Progress Notes (Signed)
ANTIBIOTIC CONSULT NOTE - INITIAL  Pharmacy Consult for Vancomycin Indication: Osteomyelitis  No Known Allergies  Patient Measurements: Height: 6' (182.9 cm) Weight: 225 lb (102.059 kg) IBW/kg (Calculated) : 77.6 Adjusted Body Weight:   Vital Signs: Temp: 98.1 F (36.7 C) (10/01 0511) Temp src: Oral (10/01 0511) BP: 129/76 mmHg (10/01 0511) Pulse Rate: 66 (10/01 0511) Intake/Output from previous day: 09/30 0701 - 10/01 0700 In: 752.5 [I.V.:252.5; IV Piggyback:500] Out: 700 [Urine:700] Intake/Output from this shift: Total I/O In: 752.5 [I.V.:252.5; IV Piggyback:500] Out: 700 [Urine:700]  Labs:  Recent Labs  10/19/13 1747 10/20/13 0505  WBC 12.7* 7.8  HGB 14.7 14.3  PLT 130* 124*  CREATININE 0.98 0.90   Estimated Creatinine Clearance: 106.6 ml/min (by C-G formula based on Cr of 0.9). No results found for this basename: VANCOTROUGH, VANCOPEAK, VANCORANDOM, GENTTROUGH, GENTPEAK, GENTRANDOM, TOBRATROUGH, TOBRAPEAK, TOBRARND, AMIKACINPEAK, AMIKACINTROU, AMIKACIN,  in the last 72 hours   Microbiology: No results found for this or any previous visit (from the past 720 hour(s)).  Medical History: Past Medical History  Diagnosis Date  . Hypertension   . Hepatitis C   . Neuropathy involving both lower extremities   . ADHD, adult residual type     Medications:  Anti-infectives   Start     Dose/Rate Route Frequency Ordered Stop   10/20/13 1000  vancomycin (VANCOCIN) IVPB 1000 mg/200 mL premix     1,000 mg 200 mL/hr over 60 Minutes Intravenous Every 12 hours 10/20/13 0639     10/19/13 2230  vancomycin (VANCOCIN) 1,500 mg in sodium chloride 0.9 % 500 mL IVPB     1,500 mg 250 mL/hr over 120 Minutes Intravenous  Once 10/19/13 2220 10/20/13 0206   10/19/13 1730  clindamycin (CLEOCIN) IVPB 600 mg     600 mg 100 mL/hr over 30 Minutes Intravenous  Once 10/19/13 1726 10/19/13 1837     Assessment: Patient with Osteomyelitis.  First dose of antibiotics already given.    Goal of Therapy:  Vancomycin trough level 15-20 mcg/ml  Plan:  Measure antibiotic drug levels at steady state Follow up culture results Vancomycin 1gm iv q12hr  Tyler Deis, Shea Stakes [place] 10/20/2013,6:40 AM

## 2013-10-20 NOTE — Consult Note (Signed)
Reason for Consult: Osteomyelitis cellulitis left foot second toe Referring Physician: Dr. Mickle Plumb is an 63 y.o. male.  HPI: Patient is a 63 year old gentleman with insensate neuropathy to his left lower extremity secondary to previous spinal surgery. Patient presents with chronic osteomyelitis and cellulitis of the second toe with cellulitis extending up the leg on the left  Past Medical History  Diagnosis Date  . Hypertension   . Hepatitis C   . Neuropathy involving both lower extremities   . ADHD, adult residual type     Past Surgical History  Procedure Laterality Date  . Back surgery  2006    Disc  . Sinus exploration  2005/2007    Family History  Problem Relation Age of Onset  . Hypertension Mother   . Hypertension Father   . Diabetes type II Neg Hx     Social History:  reports that he quit smoking about 23 years ago. His smoking use included Cigarettes and Cigars. He smoked 0.00 packs per day. He has never used smokeless tobacco. He reports that he does not drink alcohol or use illicit drugs.  Allergies: No Known Allergies  Medications: I have reviewed the patient's current medications.  Results for orders placed during the hospital encounter of 10/19/13 (from the past 48 hour(s))  CBC WITH DIFFERENTIAL     Status: Abnormal   Collection Time    10/19/13  5:47 PM      Result Value Ref Range   WBC 12.7 (*) 4.0 - 10.5 K/uL   RBC 4.69  4.22 - 5.81 MIL/uL   Hemoglobin 14.7  13.0 - 17.0 g/dL   HCT 42.9  39.0 - 52.0 %   MCV 91.5  78.0 - 100.0 fL   MCH 31.3  26.0 - 34.0 pg   MCHC 34.3  30.0 - 36.0 g/dL   RDW 13.1  11.5 - 15.5 %   Platelets 130 (*) 150 - 400 K/uL   Neutrophils Relative % 59  43 - 77 %   Neutro Abs 7.5  1.7 - 7.7 K/uL   Lymphocytes Relative 24  12 - 46 %   Lymphs Abs 3.0  0.7 - 4.0 K/uL   Monocytes Relative 15 (*) 3 - 12 %   Monocytes Absolute 1.9 (*) 0.1 - 1.0 K/uL   Eosinophils Relative 2  0 - 5 %   Eosinophils Absolute 0.2  0.0 -  0.7 K/uL   Basophils Relative 0  0 - 1 %   Basophils Absolute 0.0  0.0 - 0.1 K/uL  COMPREHENSIVE METABOLIC PANEL     Status: Abnormal   Collection Time    10/19/13  5:47 PM      Result Value Ref Range   Sodium 134 (*) 137 - 147 mEq/L   Potassium 4.0  3.7 - 5.3 mEq/L   Chloride 97  96 - 112 mEq/L   CO2 28  19 - 32 mEq/L   Glucose, Bld 91  70 - 99 mg/dL   BUN 23  6 - 23 mg/dL   Creatinine, Ser 0.98  0.50 - 1.35 mg/dL   Calcium 11.0 (*) 8.4 - 10.5 mg/dL   Total Protein 7.9  6.0 - 8.3 g/dL   Albumin 3.2 (*) 3.5 - 5.2 g/dL   AST 67 (*) 0 - 37 U/L   ALT 89 (*) 0 - 53 U/L   Alkaline Phosphatase 65  39 - 117 U/L   Total Bilirubin 1.0  0.3 - 1.2 mg/dL  GFR calc non Af Amer 87 (*) >90 mL/min   GFR calc Af Amer >90  >90 mL/min   Comment: (NOTE)     The eGFR has been calculated using the CKD EPI equation.     This calculation has not been validated in all clinical situations.     eGFR's persistently <90 mL/min signify possible Chronic Kidney     Disease.   Anion gap 9  5 - 15  SEDIMENTATION RATE     Status: Abnormal   Collection Time    10/19/13  5:47 PM      Result Value Ref Range   Sed Rate 30 (*) 0 - 16 mm/hr  I-STAT CG4 LACTIC ACID, ED     Status: None   Collection Time    10/19/13  6:10 PM      Result Value Ref Range   Lactic Acid, Venous 1.03  0.5 - 2.2 mmol/L  ABO/RH     Status: None   Collection Time    10/20/13  5:00 AM      Result Value Ref Range   ABO/RH(D) B POS    MAGNESIUM     Status: None   Collection Time    10/20/13  5:05 AM      Result Value Ref Range   Magnesium 1.8  1.5 - 2.5 mg/dL  PHOSPHORUS     Status: Abnormal   Collection Time    10/20/13  5:05 AM      Result Value Ref Range   Phosphorus 2.0 (*) 2.3 - 4.6 mg/dL  COMPREHENSIVE METABOLIC PANEL     Status: Abnormal   Collection Time    10/20/13  5:05 AM      Result Value Ref Range   Sodium 140  137 - 147 mEq/L   Potassium 3.9  3.7 - 5.3 mEq/L   Chloride 104  96 - 112 mEq/L   CO2 27  19 - 32  mEq/L   Glucose, Bld 99  70 - 99 mg/dL   BUN 23  6 - 23 mg/dL   Creatinine, Ser 0.90  0.50 - 1.35 mg/dL   Calcium 10.7 (*) 8.4 - 10.5 mg/dL   Total Protein 7.1  6.0 - 8.3 g/dL   Albumin 2.8 (*) 3.5 - 5.2 g/dL   AST 61 (*) 0 - 37 U/L   ALT 78 (*) 0 - 53 U/L   Alkaline Phosphatase 62  39 - 117 U/L   Total Bilirubin 0.8  0.3 - 1.2 mg/dL   GFR calc non Af Amer >90  >90 mL/min   GFR calc Af Amer >90  >90 mL/min   Comment: (NOTE)     The eGFR has been calculated using the CKD EPI equation.     This calculation has not been validated in all clinical situations.     eGFR's persistently <90 mL/min signify possible Chronic Kidney     Disease.   Anion gap 9  5 - 15  CBC     Status: Abnormal   Collection Time    10/20/13  5:05 AM      Result Value Ref Range   WBC 7.8  4.0 - 10.5 K/uL   RBC 4.67  4.22 - 5.81 MIL/uL   Hemoglobin 14.3  13.0 - 17.0 g/dL   HCT 42.1  39.0 - 52.0 %   MCV 90.1  78.0 - 100.0 fL   MCH 30.6  26.0 - 34.0 pg   MCHC 34.0  30.0 - 36.0 g/dL  RDW 13.0  11.5 - 15.5 %   Platelets 124 (*) 150 - 400 K/uL  PROTIME-INR     Status: None   Collection Time    10/20/13  5:05 AM      Result Value Ref Range   Prothrombin Time 14.0  11.6 - 15.2 seconds   INR 1.08  0.00 - 1.49  TYPE AND SCREEN     Status: None   Collection Time    10/20/13  5:05 AM      Result Value Ref Range   ABO/RH(D) B POS     Antibody Screen NEG     Sample Expiration 10/23/2013      Dg Toe 2nd Left  10/19/2013   CLINICAL DATA:  Left second toe wound infection for 3 months.  EXAM: LEFT SECOND TOE  COMPARISON:  None.  FINDINGS: The distal phalanx of the second toe is completely destroyed by infection and there is extensive destructive changes involving the middle phalanx also. No definite findings for septic arthritis at the PIP joint. Diffuse and marked soft tissue swelling of the second digit is demonstrated. I do not see any definite air in the soft tissues.  IMPRESSION: Advanced and aggressive  osteomyelitis involving the middle and distal phalanges of the second toe.   Electronically Signed   By: Kalman Jewels M.D.   On: 10/19/2013 18:14    Review of Systems  All other systems reviewed and are negative.  Blood pressure 129/76, pulse 66, temperature 98.1 F (36.7 C), temperature source Oral, resp. rate 16, height 6' (1.829 m), weight 102.059 kg (225 lb), SpO2 99.00%. Physical Exam On examination there is been significant reduction the cellulitis to the left leg and left foot. Patient has a good dorsalis pedis pulse. Patient had sausage digit swelling with cellulitis of the left second toe. Radiograph shows complete destruction of the distal phalanx secondary to chronic osteomyelitis. Assessment/Plan: Assessment: Chronic osteomyelitis with insensate neuropathy of the left foot second toe with resolving cellulitis.  Plan: We will plan for a second ray amputation tomorrow. Risks and benefits of surgery were discussed patient states he understands and wishes to proceed at this time. Plan for surgery Friday afternoon.  Saabir Blyth V 10/20/2013, 7:02 AM

## 2013-10-21 ENCOUNTER — Encounter (HOSPITAL_COMMUNITY): Payer: BC Managed Care – PPO | Admitting: Anesthesiology

## 2013-10-21 ENCOUNTER — Inpatient Hospital Stay (HOSPITAL_COMMUNITY)
Admission: RE | Admit: 2013-10-21 | Payer: BC Managed Care – PPO | Source: Ambulatory Visit | Admitting: Orthopedic Surgery

## 2013-10-21 ENCOUNTER — Inpatient Hospital Stay (HOSPITAL_COMMUNITY): Payer: BC Managed Care – PPO | Admitting: Anesthesiology

## 2013-10-21 ENCOUNTER — Encounter (HOSPITAL_BASED_OUTPATIENT_CLINIC_OR_DEPARTMENT_OTHER): Payer: BC Managed Care – PPO | Attending: General Surgery

## 2013-10-21 ENCOUNTER — Encounter (HOSPITAL_COMMUNITY): Payer: Self-pay | Admitting: Certified Registered Nurse Anesthetist

## 2013-10-21 ENCOUNTER — Encounter (HOSPITAL_COMMUNITY)
Admission: EM | Disposition: A | Discharge status: Home Health | Payer: Self-pay | Source: Home / Self Care | Attending: Internal Medicine

## 2013-10-21 DIAGNOSIS — L89622 Pressure ulcer of left heel, stage 2: Secondary | ICD-10-CM | POA: Insufficient documentation

## 2013-10-21 DIAGNOSIS — L89154 Pressure ulcer of sacral region, stage 4: Secondary | ICD-10-CM | POA: Insufficient documentation

## 2013-10-21 DIAGNOSIS — L89612 Pressure ulcer of right heel, stage 2: Secondary | ICD-10-CM | POA: Insufficient documentation

## 2013-10-21 DIAGNOSIS — L89894 Pressure ulcer of other site, stage 4: Secondary | ICD-10-CM | POA: Insufficient documentation

## 2013-10-21 HISTORY — PX: AMPUTATION: SHX166

## 2013-10-21 LAB — SURGICAL PCR SCREEN
MRSA, PCR: NEGATIVE
Staphylococcus aureus: POSITIVE — AB

## 2013-10-21 SURGERY — AMPUTATION, FOOT, RAY
Anesthesia: General | Laterality: Left

## 2013-10-21 SURGERY — AMPUTATION, FOOT, RAY
Anesthesia: General | Site: Toe | Laterality: Left

## 2013-10-21 MED ORDER — FENTANYL CITRATE 0.05 MG/ML IJ SOLN
INTRAMUSCULAR | Status: DC | PRN
  Administered 2013-10-21: 100 ug via INTRAVENOUS

## 2013-10-21 MED ORDER — DOCUSATE SODIUM 100 MG PO CAPS
100.0000 mg | ORAL_CAPSULE | Freq: Two times a day (BID) | ORAL | Status: DC
  Administered 2013-10-23: 100 mg via ORAL
  Filled 2013-10-21 (×6): qty 1

## 2013-10-21 MED ORDER — PIPERACILLIN-TAZOBACTAM 3.375 G IVPB
3.3750 g | INTRAVENOUS | Status: AC
  Administered 2013-10-21: 3.375 g via INTRAVENOUS
  Filled 2013-10-21: qty 50

## 2013-10-21 MED ORDER — 0.9 % SODIUM CHLORIDE (POUR BTL) OPTIME
TOPICAL | Status: DC | PRN
  Administered 2013-10-21: 1000 mL

## 2013-10-21 MED ORDER — OXYCODONE-ACETAMINOPHEN 5-325 MG PO TABS
1.0000 | ORAL_TABLET | ORAL | Status: DC | PRN
  Administered 2013-10-21 – 2013-10-23 (×6): 2 via ORAL
  Administered 2013-10-23: 1 via ORAL
  Administered 2013-10-23 – 2013-10-24 (×2): 2 via ORAL
  Filled 2013-10-21: qty 1
  Filled 2013-10-21 (×8): qty 2

## 2013-10-21 MED ORDER — LIDOCAINE HCL (CARDIAC) 20 MG/ML IV SOLN
INTRAVENOUS | Status: DC | PRN
  Administered 2013-10-21: 100 mg via INTRAVENOUS

## 2013-10-21 MED ORDER — PHENYLEPHRINE HCL 10 MG/ML IJ SOLN
INTRAMUSCULAR | Status: DC | PRN
  Administered 2013-10-21: 80 ug via INTRAVENOUS

## 2013-10-21 MED ORDER — MIDAZOLAM HCL 5 MG/5ML IJ SOLN
INTRAMUSCULAR | Status: DC | PRN
  Administered 2013-10-21: 2 mg via INTRAVENOUS

## 2013-10-21 MED ORDER — ONDANSETRON HCL 4 MG PO TABS: 4.0000 mg | ORAL_TABLET | Freq: Four times a day (QID) | ORAL | Status: DC | PRN

## 2013-10-21 MED ORDER — DEXTROSE 5 % IV SOLN
500.0000 mg | Freq: Four times a day (QID) | INTRAVENOUS | Status: DC | PRN
  Filled 2013-10-21: qty 5

## 2013-10-21 MED ORDER — SODIUM CHLORIDE 0.9 % IV SOLN: INTRAVENOUS | Status: DC

## 2013-10-21 MED ORDER — ONDANSETRON HCL 4 MG/2ML IJ SOLN
INTRAMUSCULAR | Status: DC | PRN
  Administered 2013-10-21: 4 mg via INTRAVENOUS

## 2013-10-21 MED ORDER — METHOCARBAMOL 500 MG PO TABS
500.0000 mg | ORAL_TABLET | Freq: Four times a day (QID) | ORAL | Status: DC | PRN
Start: 2013-10-21 — End: 2013-10-24

## 2013-10-21 MED ORDER — ONDANSETRON HCL 4 MG/2ML IJ SOLN
4.0000 mg | Freq: Four times a day (QID) | INTRAMUSCULAR | Status: DC | PRN
Start: 2013-10-21 — End: 2013-10-24

## 2013-10-21 MED ORDER — PROPOFOL 10 MG/ML IV BOLUS
INTRAVENOUS | Status: DC | PRN
  Administered 2013-10-21: 200 mg via INTRAVENOUS

## 2013-10-21 MED ORDER — METOCLOPRAMIDE HCL 10 MG PO TABS: 5.0000 mg | ORAL_TABLET | Freq: Three times a day (TID) | ORAL | Status: DC | PRN

## 2013-10-21 MED ORDER — PIPERACILLIN-TAZOBACTAM 3.375 G IVPB
3.3750 g | Freq: Three times a day (TID) | INTRAVENOUS | Status: DC
  Administered 2013-10-21 – 2013-10-22 (×2): 3.375 g via INTRAVENOUS
  Filled 2013-10-21 (×4): qty 50

## 2013-10-21 MED ORDER — METOPROLOL TARTRATE 50 MG PO TABS
50.0000 mg | ORAL_TABLET | Freq: Two times a day (BID) | ORAL | Status: DC
  Administered 2013-10-21 – 2013-10-24 (×7): 50 mg via ORAL
  Filled 2013-10-21 (×8): qty 1

## 2013-10-21 MED ORDER — ENOXAPARIN SODIUM 40 MG/0.4ML ~~LOC~~ SOLN
40.0000 mg | SUBCUTANEOUS | Status: DC
  Administered 2013-10-21 – 2013-10-23 (×3): 40 mg via SUBCUTANEOUS
  Filled 2013-10-21 (×4): qty 0.4

## 2013-10-21 MED ORDER — HYDROMORPHONE HCL 1 MG/ML IJ SOLN: 0.5000 mg | INTRAMUSCULAR | Status: DC | PRN

## 2013-10-21 MED ORDER — METOCLOPRAMIDE HCL 5 MG/ML IJ SOLN: 5.0000 mg | Freq: Three times a day (TID) | INTRAMUSCULAR | Status: DC | PRN

## 2013-10-21 MED ORDER — EPHEDRINE SULFATE 50 MG/ML IJ SOLN
INTRAMUSCULAR | Status: DC | PRN
  Administered 2013-10-21: 10 mg via INTRAVENOUS

## 2013-10-21 MED ORDER — LACTATED RINGERS IV SOLN
INTRAVENOUS | Status: DC | PRN
  Administered 2013-10-21: 13:00:00 via INTRAVENOUS

## 2013-10-21 MED ORDER — HYDROMORPHONE HCL 1 MG/ML IJ SOLN: 0.2500 mg | INTRAMUSCULAR | Status: DC | PRN

## 2013-10-21 SURGICAL SUPPLY — 30 items
BLADE LONG MED 31MMX9MM (MISCELLANEOUS) ×1
BLADE LONG MED 31X9 (MISCELLANEOUS) ×2 IMPLANT
BLADE SAW SGTL MED 73X18.5 STR (BLADE) ×3 IMPLANT
BNDG COHESIVE 4X5 TAN STRL (GAUZE/BANDAGES/DRESSINGS) ×3 IMPLANT
BNDG GAUZE ELAST 4 BULKY (GAUZE/BANDAGES/DRESSINGS) ×6 IMPLANT
COVER SURGICAL LIGHT HANDLE (MISCELLANEOUS) ×3 IMPLANT
DRAPE U-SHAPE 47X51 STRL (DRAPES) ×6 IMPLANT
DRSG ADAPTIC 3X8 NADH LF (GAUZE/BANDAGES/DRESSINGS) ×3 IMPLANT
DRSG PAD ABDOMINAL 8X10 ST (GAUZE/BANDAGES/DRESSINGS) ×6 IMPLANT
DURAPREP 26ML APPLICATOR (WOUND CARE) ×3 IMPLANT
ELECT REM PT RETURN 9FT ADLT (ELECTROSURGICAL) ×3
ELECTRODE REM PT RTRN 9FT ADLT (ELECTROSURGICAL) ×1 IMPLANT
GAUZE SPONGE 4X4 12PLY STRL (GAUZE/BANDAGES/DRESSINGS) ×3 IMPLANT
GLOVE BIOGEL PI IND STRL 9 (GLOVE) ×1 IMPLANT
GLOVE BIOGEL PI INDICATOR 9 (GLOVE) ×2
GLOVE SURG ORTHO 9.0 STRL STRW (GLOVE) ×3 IMPLANT
GOWN STRL REUS W/ TWL XL LVL3 (GOWN DISPOSABLE) ×2 IMPLANT
GOWN STRL REUS W/TWL XL LVL3 (GOWN DISPOSABLE) ×4
KIT BASIN OR (CUSTOM PROCEDURE TRAY) ×3 IMPLANT
KIT ROOM TURNOVER OR (KITS) ×3 IMPLANT
NS IRRIG 1000ML POUR BTL (IV SOLUTION) ×3 IMPLANT
PACK ORTHO EXTREMITY (CUSTOM PROCEDURE TRAY) ×3 IMPLANT
PAD ARMBOARD 7.5X6 YLW CONV (MISCELLANEOUS) ×6 IMPLANT
SPONGE LAP 18X18 X RAY DECT (DISPOSABLE) ×3 IMPLANT
STOCKINETTE IMPERVIOUS LG (DRAPES) IMPLANT
SUT ETHILON 2 0 PSLX (SUTURE) ×6 IMPLANT
TOWEL OR 17X24 6PK STRL BLUE (TOWEL DISPOSABLE) ×3 IMPLANT
TOWEL OR 17X26 10 PK STRL BLUE (TOWEL DISPOSABLE) ×3 IMPLANT
UNDERPAD 30X30 INCONTINENT (UNDERPADS AND DIAPERS) ×3 IMPLANT
WATER STERILE IRR 1000ML POUR (IV SOLUTION) IMPLANT

## 2013-10-21 NOTE — Progress Notes (Signed)
Utilization review completed.  

## 2013-10-21 NOTE — Transfer of Care (Signed)
Immediate Anesthesia Transfer of Care Note  Patient: William Solis  Procedure(s) Performed: Procedure(s): LEFT FOOT SECOND RAY AMPUTATION  (Left)  Patient Location: PACU  Anesthesia Type:General  Level of Consciousness: awake, alert , oriented, patient cooperative and responds to stimulation  Airway & Oxygen Therapy: Patient Spontanous Breathing and Patient connected to nasal cannula oxygen  Post-op Assessment: Report given to PACU RN, Post -op Vital signs reviewed and stable and Patient moving all extremities X 4  Post vital signs: Reviewed and stable  Complications: No apparent anesthesia complications

## 2013-10-21 NOTE — Progress Notes (Signed)
[facility] PROGRESS NOTE  William Solis MVH:[id number] DOB: 05/01/1950 DOA: 10/19/2013 PCP: Leamon Arnt, MD  Assessment/Plan: 63 y.o. male with PMH of HTN, Hep C, s/p spinal fusion, peripheral neuropathy, who presented to [facility] ED 10/19/2013 with ongoing left second toe infection for past 3 months prior to this admission. Patient was on Augmentin given to him by podiatrist and he has completed the treatment with it 10/10/2013. -admitted with cellulitis, osteomyelitis   1. Acute osteomyelitis of the left second toe / Cellulitis / Leukocytosis  -cont IV atx, add gr"-" perio op; pain control;  -scheduled for 2nd toe amputation on 10/2 per ortho   2. Hypertension; cont Norvasc 10 mg daily, hold lisinopril 40 mg periop; change metoprolol 50 mg BID;  3. Hypercalcemia Likely due to possible dehydration, acute infection +bone destruction+HCTZ  -cont IV fluids., Follow up BMP in the morning.  3. Thrombocytopenia; Mild, platelet count 130, 124.; no signs of bleed.    DVT Prophylaxis start SQ lovenox   Code Status: full Family Communication:  D/w patient,  (indicate person spoken with, relationship, and if by phone, the number) Disposition Plan: home pend surgery    Consultants:  ortho  Procedures:  Pend surgery   Antibiotics:  vanc 9/30<<<<   (indicate start date, and stop date if known)  HPI/Subjective: alert  Objective: Filed Vitals:   10/21/13 0627  BP: 130/70  Pulse: 65  Temp: 97.7 F (36.5 C)  Resp:     Intake/Output Summary (Last 24 hours) at 10/21/13 0943 Last data filed at 10/21/13 8413  Gross per 24 hour  Intake      0 ml  Output      0 ml  Net      0 ml   Filed Weights   10/20/13 0519  Weight: 102.059 kg (225 lb)    Exam:   General:  alert  Cardiovascular: s1,s2 rrr  Respiratory: CTA BL  Abdomen: soft, nt,nd  Musculoskeletal: cellulitis of the second toe with cellulitis extending up the leg on the left     Data Reviewed: Basic  Metabolic Panel:  Recent Labs Lab 10/19/13 1747 10/20/13 0505  NA 134* 140  K 4.0 3.9  CL 97 104  CO2 28 27  GLUCOSE 91 99  BUN 23 23  CREATININE 0.98 0.90  CALCIUM 11.0* 10.7*  MG  --  1.8  PHOS  --  2.0*   Liver Function Tests:  Recent Labs Lab 10/19/13 1747 10/20/13 0505  AST 67* 61*  ALT 89* 78*  ALKPHOS 65 62  BILITOT 1.0 0.8  PROT 7.9 7.1  ALBUMIN 3.2* 2.8*   No results found for this basename: LIPASE, AMYLASE,  in the last 168 hours No results found for this basename: AMMONIA,  in the last 168 hours CBC:  Recent Labs Lab 10/19/13 1747 10/20/13 0505  WBC 12.7* 7.8  NEUTROABS 7.5  --   HGB 14.7 14.3  HCT 42.9 42.1  MCV 91.5 90.1  PLT 130* 124*   Cardiac Enzymes: No results found for this basename: CKTOTAL, CKMB, CKMBINDEX, TROPONINI,  in the last 168 hours BNP (last 3 results) No results found for this basename: PROBNP,  in the last 8760 hours CBG: No results found for this basename: GLUCAP,  in the last 168 hours  Recent Results (from the past 240 hour(s))  CULTURE, BLOOD (ROUTINE X 2)     Status: None   Collection Time    10/19/13  5:47 PM  Result Value Ref Range Status   Specimen Description BLOOD RIGHT ANTECUBITAL   Final   Special Requests BOTTLES DRAWN AEROBIC AND ANAEROBIC 5ML   Final   Culture  Setup Time     Final   Value: 10/19/2013 20:20     Performed at [facility]   Culture     Final   Value: GRAM POSITIVE COCCI IN CLUSTERS     Note: Gram Stain Report Called to,Read Back By and Verified With: BRIANA RICHARDSON @ 6295 ON 100115 BY [facility]     Performed at [facility]   Report Status PENDING   Incomplete  CULTURE, BLOOD (ROUTINE X 2)     Status: None   Collection Time    10/19/13  5:56 PM      Result Value Ref Range Status   Specimen Description BLOOD LEFT ANTECUBITAL   Final   Special Requests BOTTLES DRAWN AEROBIC AND ANAEROBIC 5ML   Final   Culture  Setup Time     Final   Value: 10/19/2013 20:20      Performed at [facility]   Culture     Final   Value:        BLOOD CULTURE RECEIVED NO GROWTH TO DATE CULTURE WILL BE HELD FOR 5 DAYS BEFORE ISSUING A FINAL NEGATIVE REPORT     Performed at [facility]   Report Status PENDING   Incomplete  WOUND CULTURE     Status: None   Collection Time    10/19/13  6:01 PM      Result Value Ref Range Status   Specimen Description FOOT   Final   Special Requests Normal   Final   Gram Stain     Final   Value: ABUNDANT WBC PRESENT,BOTH PMN AND MONONUCLEAR     NO SQUAMOUS EPITHELIAL CELLS SEEN     RARE GRAM POSITIVE COCCI     IN PAIRS IN CLUSTERS     Performed at [facility]   Culture     Final   Value: MODERATE STAPHYLOCOCCUS AUREUS     Note: RIFAMPIN AND GENTAMICIN SHOULD NOT BE USED AS SINGLE DRUGS FOR TREATMENT OF STAPH INFECTIONS.     Performed at [facility]   Report Status PENDING   Incomplete     Studies: Dg Toe 2nd Left  10/19/2013   CLINICAL DATA:  Left second toe wound infection for 3 months.  EXAM: LEFT SECOND TOE  COMPARISON:  None.  FINDINGS: The distal phalanx of the second toe is completely destroyed by infection and there is extensive destructive changes involving the middle phalanx also. No definite findings for septic arthritis at the PIP joint. Diffuse and marked soft tissue swelling of the second digit is demonstrated. I do not see any definite air in the soft tissues.  IMPRESSION: Advanced and aggressive osteomyelitis involving the middle and distal phalanges of the second toe.   Electronically Signed   By: Kalman Jewels M.D.   On: 10/19/2013 18:14    Scheduled Meds: . amLODipine  10 mg Oral Daily  . chlorhexidine  60 mL Topical Once  . docusate sodium  100 mg Oral BID  . escitalopram  30 mg Oral Daily  . lisinopril  40 mg Oral Daily  . methylphenidate  20 mg Oral QAC breakfast  . metoprolol  50 mg Oral QID  . vancomycin  1,000 mg Intravenous Q12H   Continuous Infusions:   Active  Problems:   Cellulitis  and abscess of toe   Osteomyelitis of toe of left foot   Osteomyelitis    Time spent: >35 minutes     Kinnie Feil  [facility] Pager [phone]. If 7PM-7AM, please contact night-coverage at [email or web address], password [facility] 10/21/2013, 9:43 AM  LOS: 2 days

## 2013-10-21 NOTE — Progress Notes (Signed)
Orthopedic Tech Progress Note Patient Details:  William Solis [birth year] [id number] Fit for size and put on shelf until use with PT Ortho Devices Type of Ortho Device: Postop shoe/boot Ortho Device/Splint Location: LLE Ortho Device/Splint Interventions: Application   Asia R Thompson 10/21/2013, 4:00 PM

## 2013-10-21 NOTE — Interval H&P Note (Signed)
History and Physical Interval Note:  10/21/2013 6:32 AM  William Solis  has presented today for surgery, with the diagnosis of ostomyelitis left 2nd toe  The various methods of treatment have been discussed with the patient and family. After consideration of risks, benefits and other options for treatment, the patient has consented to  Procedure(s): LEFT FOOT 2ND RAY AMPUTATION  (Left) as a surgical intervention .  The patient's history has been reviewed, patient examined, no change in status, stable for surgery.  I have reviewed the patient's chart and labs.  Questions were answered to the patient's satisfaction.     DUDA,MARCUS V

## 2013-10-21 NOTE — H&P (View-Only) (Signed)
Reason for Consult: Osteomyelitis cellulitis left foot second toe Referring Physician: Dr. Audrie Lia is an 63 y.o. male.  HPI: Patient is a 63 year old gentleman with insensate neuropathy to his left lower extremity secondary to previous spinal surgery. Patient presents with chronic osteomyelitis and cellulitis of the second toe with cellulitis extending up the leg on the left  Past Medical History  Diagnosis Date  . Hypertension   . Hepatitis C   . Neuropathy involving both lower extremities   . ADHD, adult residual type     Past Surgical History  Procedure Laterality Date  . Back surgery  2006    Disc  . Sinus exploration  2005/2007    Family History  Problem Relation Age of Onset  . Hypertension Mother   . Hypertension Father   . Diabetes type II Neg Hx     Social History:  reports that he quit smoking about 23 years ago. His smoking use included Cigarettes and Cigars. He smoked 0.00 packs per day. He has never used smokeless tobacco. He reports that he does not drink alcohol or use illicit drugs.  Allergies: No Known Allergies  Medications: I have reviewed the patient's current medications.  Results for orders placed during the hospital encounter of 10/19/13 (from the past 48 hour(s))  CBC WITH DIFFERENTIAL     Status: Abnormal   Collection Time    10/19/13  5:47 PM      Result Value Ref Range   WBC 12.7 (*) 4.0 - 10.5 K/uL   RBC 4.69  4.22 - 5.81 MIL/uL   Hemoglobin 14.7  13.0 - 17.0 g/dL   HCT 53.7  37.3 - 54.6 %   MCV 91.5  78.0 - 100.0 fL   MCH 31.3  26.0 - 34.0 pg   MCHC 34.3  30.0 - 36.0 g/dL   RDW 14.9  93.1 - 93.6 %   Platelets 130 (*) 150 - 400 K/uL   Neutrophils Relative % 59  43 - 77 %   Neutro Abs 7.5  1.7 - 7.7 K/uL   Lymphocytes Relative 24  12 - 46 %   Lymphs Abs 3.0  0.7 - 4.0 K/uL   Monocytes Relative 15 (*) 3 - 12 %   Monocytes Absolute 1.9 (*) 0.1 - 1.0 K/uL   Eosinophils Relative 2  0 - 5 %   Eosinophils Absolute 0.2  0.0 -  0.7 K/uL   Basophils Relative 0  0 - 1 %   Basophils Absolute 0.0  0.0 - 0.1 K/uL  COMPREHENSIVE METABOLIC PANEL     Status: Abnormal   Collection Time    10/19/13  5:47 PM      Result Value Ref Range   Sodium 134 (*) 137 - 147 mEq/L   Potassium 4.0  3.7 - 5.3 mEq/L   Chloride 97  96 - 112 mEq/L   CO2 28  19 - 32 mEq/L   Glucose, Bld 91  70 - 99 mg/dL   BUN 23  6 - 23 mg/dL   Creatinine, Ser 5.83  0.50 - 1.35 mg/dL   Calcium 64.9 (*) 8.4 - 10.5 mg/dL   Total Protein 7.9  6.0 - 8.3 g/dL   Albumin 3.2 (*) 3.5 - 5.2 g/dL   AST 67 (*) 0 - 37 U/L   ALT 89 (*) 0 - 53 U/L   Alkaline Phosphatase 65  39 - 117 U/L   Total Bilirubin 1.0  0.3 - 1.2 mg/dL  GFR calc non Af Amer 87 (*) >90 mL/min   GFR calc Af Amer >90  >90 mL/min   Comment: (NOTE)     The eGFR has been calculated using the CKD EPI equation.     This calculation has not been validated in all clinical situations.     eGFR's persistently <90 mL/min signify possible Chronic Kidney     Disease.   Anion gap 9  5 - 15  SEDIMENTATION RATE     Status: Abnormal   Collection Time    10/19/13  5:47 PM      Result Value Ref Range   Sed Rate 30 (*) 0 - 16 mm/hr  I-STAT CG4 LACTIC ACID, ED     Status: None   Collection Time    10/19/13  6:10 PM      Result Value Ref Range   Lactic Acid, Venous 1.03  0.5 - 2.2 mmol/L  ABO/RH     Status: None   Collection Time    10/20/13  5:00 AM      Result Value Ref Range   ABO/RH(D) B POS    MAGNESIUM     Status: None   Collection Time    10/20/13  5:05 AM      Result Value Ref Range   Magnesium 1.8  1.5 - 2.5 mg/dL  PHOSPHORUS     Status: Abnormal   Collection Time    10/20/13  5:05 AM      Result Value Ref Range   Phosphorus 2.0 (*) 2.3 - 4.6 mg/dL  COMPREHENSIVE METABOLIC PANEL     Status: Abnormal   Collection Time    10/20/13  5:05 AM      Result Value Ref Range   Sodium 140  137 - 147 mEq/L   Potassium 3.9  3.7 - 5.3 mEq/L   Chloride 104  96 - 112 mEq/L   CO2 27  19 - 32  mEq/L   Glucose, Bld 99  70 - 99 mg/dL   BUN 23  6 - 23 mg/dL   Creatinine, Ser 0.90  0.50 - 1.35 mg/dL   Calcium 10.7 (*) 8.4 - 10.5 mg/dL   Total Protein 7.1  6.0 - 8.3 g/dL   Albumin 2.8 (*) 3.5 - 5.2 g/dL   AST 61 (*) 0 - 37 U/L   ALT 78 (*) 0 - 53 U/L   Alkaline Phosphatase 62  39 - 117 U/L   Total Bilirubin 0.8  0.3 - 1.2 mg/dL   GFR calc non Af Amer >90  >90 mL/min   GFR calc Af Amer >90  >90 mL/min   Comment: (NOTE)     The eGFR has been calculated using the CKD EPI equation.     This calculation has not been validated in all clinical situations.     eGFR's persistently <90 mL/min signify possible Chronic Kidney     Disease.   Anion gap 9  5 - 15  CBC     Status: Abnormal   Collection Time    10/20/13  5:05 AM      Result Value Ref Range   WBC 7.8  4.0 - 10.5 K/uL   RBC 4.67  4.22 - 5.81 MIL/uL   Hemoglobin 14.3  13.0 - 17.0 g/dL   HCT 42.1  39.0 - 52.0 %   MCV 90.1  78.0 - 100.0 fL   MCH 30.6  26.0 - 34.0 pg   MCHC 34.0  30.0 - 36.0 g/dL  RDW 13.0  11.5 - 15.5 %   Platelets 124 (*) 150 - 400 K/uL  PROTIME-INR     Status: None   Collection Time    10/20/13  5:05 AM      Result Value Ref Range   Prothrombin Time 14.0  11.6 - 15.2 seconds   INR 1.08  0.00 - 1.49  TYPE AND SCREEN     Status: None   Collection Time    10/20/13  5:05 AM      Result Value Ref Range   ABO/RH(D) B POS     Antibody Screen NEG     Sample Expiration 10/23/2013      Dg Toe 2nd Left  10/19/2013   CLINICAL DATA:  Left second toe wound infection for 3 months.  EXAM: LEFT SECOND TOE  COMPARISON:  None.  FINDINGS: The distal phalanx of the second toe is completely destroyed by infection and there is extensive destructive changes involving the middle phalanx also. No definite findings for septic arthritis at the PIP joint. Diffuse and marked soft tissue swelling of the second digit is demonstrated. I do not see any definite air in the soft tissues.  IMPRESSION: Advanced and aggressive  osteomyelitis involving the middle and distal phalanges of the second toe.   Electronically Signed   By: Kalman Jewels M.D.   On: 10/19/2013 18:14    Review of Systems  All other systems reviewed and are negative.  Blood pressure 129/76, pulse 66, temperature 98.1 F (36.7 C), temperature source Oral, resp. rate 16, height 6' (1.829 m), weight 102.059 kg (225 lb), SpO2 99.00%. Physical Exam On examination there is been significant reduction the cellulitis to the left leg and left foot. Patient has a good dorsalis pedis pulse. Patient had sausage digit swelling with cellulitis of the left second toe. Radiograph shows complete destruction of the distal phalanx secondary to chronic osteomyelitis. Assessment/Plan: Assessment: Chronic osteomyelitis with insensate neuropathy of the left foot second toe with resolving cellulitis.  Plan: We will plan for a second ray amputation tomorrow. Risks and benefits of surgery were discussed patient states he understands and wishes to proceed at this time. Plan for surgery Friday afternoon.  Ayven Glasco V 10/20/2013, 7:02 AM

## 2013-10-21 NOTE — Progress Notes (Signed)
ANTIBIOTIC CONSULT NOTE - INITIAL  Pharmacy Consult for Zosyn; already on vancomycin Indication: cellulitis, osteo of L 2nd toe  No Known Allergies  Patient Measurements: Height: 6' (182.9 cm) Weight: 225 lb (102.059 kg) IBW/kg (Calculated) : 77.6  Vital Signs: Temp: 97.7 F (36.5 C) (10/02 0627) Temp Source: Oral (10/02 0627) BP: 150/79 mmHg (10/02 0954) Pulse Rate: 65 (10/02 0627) Intake/Output from previous day:   Intake/Output from this shift:    Labs:  Recent Labs  10/19/13 1747 10/20/13 0505  WBC 12.7* 7.8  HGB 14.7 14.3  PLT 130* 124*  CREATININE 0.98 0.90   Estimated Creatinine Clearance: 106.6 ml/min (by C-G formula based on Cr of 0.9). No results found for this basename: VANCOTROUGH, VANCOPEAK, VANCORANDOM, GENTTROUGH, GENTPEAK, GENTRANDOM, TOBRATROUGH, TOBRAPEAK, TOBRARND, AMIKACINPEAK, AMIKACINTROU, AMIKACIN,  in the last 72 hours   Microbiology: Recent Results (from the past 720 hour(s))  CULTURE, BLOOD (ROUTINE X 2)     Status: None   Collection Time    10/19/13  5:47 PM      Result Value Ref Range Status   Specimen Description BLOOD RIGHT ANTECUBITAL   Final   Special Requests BOTTLES DRAWN AEROBIC AND ANAEROBIC 5ML   Final   Culture  Setup Time     Final   Value: 10/19/2013 20:20     Performed at [facility]   Culture     Final   Value: GRAM POSITIVE COCCI IN CLUSTERS     Note: Gram Stain Report Called to,Read Back By and Verified With: BRIANA RICHARDSON @ [place] ON [address]115 BY [place]     Performed at [facility]   Report Status PENDING   Incomplete  CULTURE, BLOOD (ROUTINE X 2)     Status: None   Collection Time    10/19/13  5:56 PM      Result Value Ref Range Status   Specimen Description BLOOD LEFT ANTECUBITAL   Final   Special Requests BOTTLES DRAWN AEROBIC AND ANAEROBIC 5ML   Final   Culture  Setup Time     Final   Value: 10/19/2013 20:20     Performed at [facility]   Culture     Final   Value:        BLOOD  CULTURE RECEIVED NO GROWTH TO DATE CULTURE WILL BE HELD FOR 5 DAYS BEFORE ISSUING A FINAL NEGATIVE REPORT     Performed at [facility]   Report Status PENDING   Incomplete  WOUND CULTURE     Status: None   Collection Time    10/19/13  6:01 PM      Result Value Ref Range Status   Specimen Description FOOT   Final   Special Requests Normal   Final   Gram Stain     Final   Value: ABUNDANT WBC PRESENT,BOTH PMN AND MONONUCLEAR     NO SQUAMOUS EPITHELIAL CELLS SEEN     RARE GRAM POSITIVE COCCI     IN PAIRS IN CLUSTERS     Performed at [facility]   Culture     Final   Value: MODERATE STAPHYLOCOCCUS AUREUS     Note: RIFAMPIN AND GENTAMICIN SHOULD NOT BE USED AS SINGLE DRUGS FOR TREATMENT OF STAPH INFECTIONS.     Performed at [facility]   Report Status PENDING   Incomplete    Medical History: Past Medical History  Diagnosis Date  . Hypertension   . Hepatitis C   . Neuropathy involving both  lower extremities   . ADHD, adult residual type     Medications:  Prescriptions prior to admission  Medication Sig Dispense Refill  . amLODipine (NORVASC) 10 MG tablet Take 10 mg by mouth daily.      Marland Kitchen amoxicillin-clavulanate (AUGMENTIN) 500-125 MG per tablet Take 1 tablet (500 mg total) by mouth 3 (three) times daily.  45 tablet  0  . Artificial Tear Ointment (ARTIFICIAL TEARS) ointment Place 1 drop into both eyes as needed (dry eyes).      . Ascorbic Acid (VITAMIN C) 1000 MG tablet Take 1,000 mg by mouth daily.      . clonazePAM (KLONOPIN) 0.5 MG tablet Take 0.5 mg by mouth 2 (two) times daily as needed for anxiety.      . cyclobenzaprine (FLEXERIL) 10 MG tablet Take 10 mg by mouth 3 (three) times daily as needed for muscle spasms.      Marland Kitchen escitalopram (LEXAPRO) 20 MG tablet Take 30 mg by mouth daily.      . fexofenadine (ALLEGRA) 180 MG tablet Take 180 mg by mouth daily.      . fluticasone (FLONASE) 50 MCG/ACT nasal spray Place 1 spray into both nostrils daily.       Marland Kitchen gabapentin (NEURONTIN) 600 MG tablet Take 600 mg by mouth at bedtime as needed (pain).       . hydrochlorothiazide (HYDRODIURIL) 25 MG tablet Take 25 mg by mouth daily.      Marland Kitchen lisinopril (PRINIVIL,ZESTRIL) 40 MG tablet Take 40 mg by mouth daily.      . Melatonin 5 MG TABS Take 0.5 tablets by mouth at bedtime.       . meloxicam (MOBIC) 7.5 MG tablet Take 7.5 mg by mouth daily.      . methylphenidate (RITALIN) 20 MG tablet Take 20 mg by mouth daily.      . metoprolol (LOPRESSOR) 50 MG tablet Take 50 mg by mouth 4 (four) times daily.      . Multiple Vitamins-Minerals (MULTIVITAMIN WITH MINERALS) tablet Take 1 tablet by mouth daily.      . Omega-3 Fatty Acids (FISH OIL PO) Take 1,200 mg by mouth 2 (two) times daily.       Marland Kitchen oxycodone (OXY-IR) 5 MG capsule Take 10 mg by mouth every 4 (four) hours as needed for pain.       Assessment: 63 y/o male with neuropathy who presented to the [facility] ED on 9/30 for L toe infection since May. He failed outpatient treatment with Augmentin. Plan is for amputation of L 2nd toe today. Pharmacy consulted to add Zosyn for more broad coverage. He is afebrile, WBC are normal, and renal function is stable. Wound culture reveals moderate Staph aureus. One blood culture with GPC clusters.  Goal of Therapy:  Vancomycin trough level 15-20 mcg/ml Eradication of infection  Plan:  - Zosyn 3.375 g IV q8h to be infused over 4 hours - Continue vancomycin 1000 mg IV q12h - Steady-state trough if continued post-op - Monitor renal function, clinical course, and culture data  [facility], Pharm.D., BCPS Clinical Pharmacist Pager: [phone] 10/21/2013 10:08 AM

## 2013-10-21 NOTE — Op Note (Signed)
10/19/2013 - 10/21/2013  1:18 PM  PATIENT:  William Solis    PRE-OPERATIVE DIAGNOSIS:  ostomyelitis left second toe  POST-OPERATIVE DIAGNOSIS:  Same  PROCEDURE:  LEFT FOOT SECOND RAY AMPUTATION   SURGEON:  [name], MD  PHYSICIAN ASSISTANT:None ANESTHESIA:   General  PREOPERATIVE INDICATIONS:  [name] is a  63 y.o. male with a diagnosis of ostomyelitis left second toe who failed conservative measures and elected for surgical management.    The risks benefits and alternatives were discussed with the patient preoperatively including but not limited to the risks of infection, bleeding, nerve injury, cardiopulmonary complications, the need for revision surgery, among others, and the patient was willing to proceed.  OPERATIVE IMPLANTS: None  OPERATIVE FINDINGS: No deep abscess purulence or necrotic tissue  OPERATIVE PROCEDURE: Patient is a 63 year old gentleman with insensate neuropathy he does not have diabetes with chronic osteomyelitis of the left foot second toe patient presents at this time for ray amputation. Risks and benefits were discussed including risk of recurrent infection risk of the wound nonhealing. Patient states he understands and wishes to proceed at this time. Description of procedure patient was brought to the operating room and underwent a general anesthetic. After adequate levels and anesthesia were obtained patient's left lower extremity was prepped using DuraPrep draped into a sterile field. A fishmouth incision was made dorsally over the MTP joint the second ray was amputated through the base of the second metatarsal the wound is irrigated with normal saline hemostasis was obtained. The incision was closed using 2-0 nylon. Wound is covered with a sterile compressive dressing. Followup in the office in one week.

## 2013-10-21 NOTE — Anesthesia Postprocedure Evaluation (Signed)
  Anesthesia Post-op Note  Patient: William Solis  Procedure(s) Performed: Procedure(s): LEFT FOOT SECOND RAY AMPUTATION  (Left)  Patient Location: PACU  Anesthesia Type:General  Level of Consciousness: awake  Airway and Oxygen Therapy: Patient Spontanous Breathing  Post-op Pain: mild  Post-op Assessment: Post-op Vital signs reviewed  Post-op Vital Signs: Reviewed  Last Vitals:  Filed Vitals:   10/21/13 1434  BP: 127/68  Pulse: 63  Temp: 36.9 C  Resp: 14    Complications: No apparent anesthesia complications

## 2013-10-22 DIAGNOSIS — G8929 Other chronic pain: Secondary | ICD-10-CM

## 2013-10-22 DIAGNOSIS — B1920 Unspecified viral hepatitis C without hepatic coma: Secondary | ICD-10-CM

## 2013-10-22 DIAGNOSIS — Z89422 Acquired absence of other left toe(s): Secondary | ICD-10-CM

## 2013-10-22 DIAGNOSIS — M549 Dorsalgia, unspecified: Secondary | ICD-10-CM

## 2013-10-22 DIAGNOSIS — B958 Unspecified staphylococcus as the cause of diseases classified elsewhere: Secondary | ICD-10-CM

## 2013-10-22 DIAGNOSIS — G629 Polyneuropathy, unspecified: Secondary | ICD-10-CM

## 2013-10-22 DIAGNOSIS — M868X8 Other osteomyelitis, other site: Secondary | ICD-10-CM

## 2013-10-22 LAB — CULTURE, BLOOD (ROUTINE X 2)

## 2013-10-22 LAB — WOUND CULTURE: Special Requests: NORMAL

## 2013-10-22 LAB — HIV ANTIBODY (ROUTINE TESTING W REFLEX): HIV: NONREACTIVE

## 2013-10-22 MED ORDER — SODIUM CHLORIDE 0.9 % IJ SOLN
10.0000 mL | INTRAMUSCULAR | Status: DC | PRN
  Administered 2013-10-23: 10 mL

## 2013-10-22 MED ORDER — CEFAZOLIN SODIUM-DEXTROSE 2-3 GM-% IV SOLR
2.0000 g | Freq: Three times a day (TID) | INTRAVENOUS | Status: DC
  Administered 2013-10-22 – 2013-10-24 (×7): 2 g via INTRAVENOUS
  Filled 2013-10-22 (×11): qty 50

## 2013-10-22 NOTE — Progress Notes (Signed)
[facility] PROGRESS NOTE  William Solis ASN:[id number] DOB: 01/06/1951 DOA: 10/19/2013 PCP: Leamon Arnt, MD  Assessment/Plan: 63 y.o. male with PMH of HTN, Hep C, s/p spinal fusion, peripheral neuropathy, who presented to [facility] ED 10/19/2013 with ongoing left second toe infection for past 3 months prior to this admission. Patient was on Augmentin given to him by podiatrist and he has completed the treatment with it 10/10/2013. -admitted with cellulitis, osteomyelitis   1. Acute osteomyelitis of the left second toe / Cellulitis / Leukocytosis  -10/2: s/p 2nd toe amputation; per ortho  -wound cultures +staph aureus; change to PO short course atx AM; pain control;  2. Hypertension; cont Norvasc 10 mg daily, hold lisinopril 40 mg periop; change metoprolol 50 mg BID;  3. Hypercalcemia Likely due to possible dehydration, acute infection +bone destruction+HCTZ  -cont IV fluids., Follow up BMP in the morning.  3. Thrombocytopenia; Mild, platelet count 130, 124.; no signs of bleed.    DVT Prophylaxis start SQ lovenox   Code Status: full Family Communication:  D/w patient,  (indicate person spoken with, relationship, and if by phone, the number) Disposition Plan: home  Likely in 24-48 hrs    Consultants:  ortho  Procedures:  Pend surgery   Antibiotics:  vanc 9/30<<<<   (indicate start date, and stop date if known)  HPI/Subjective: alert  Objective: Filed Vitals:   10/22/13 0540  BP: 122/68  Pulse: 70  Temp: 98.4 F (36.9 C)  Resp: 14    Intake/Output Summary (Last 24 hours) at 10/22/13 0852 Last data filed at 10/21/13 1848  Gross per 24 hour  Intake    580 ml  Output    550 ml  Net     30 ml   Filed Weights   10/20/13 0519  Weight: 102.059 kg (225 lb)    Exam:   General:  alert  Cardiovascular: s1,s2 rrr  Respiratory: CTA BL  Abdomen: soft, nt,nd  Musculoskeletal: foot post op dressed      Data Reviewed: Basic Metabolic Panel:  Recent  Labs Lab 10/19/13 1747 10/20/13 0505  NA 134* 140  K 4.0 3.9  CL 97 104  CO2 28 27  GLUCOSE 91 99  BUN 23 23  CREATININE 0.98 0.90  CALCIUM 11.0* 10.7*  MG  --  1.8  PHOS  --  2.0*   Liver Function Tests:  Recent Labs Lab 10/19/13 1747 10/20/13 0505  AST 67* 61*  ALT 89* 78*  ALKPHOS 65 62  BILITOT 1.0 0.8  PROT 7.9 7.1  ALBUMIN 3.2* 2.8*   No results found for this basename: LIPASE, AMYLASE,  in the last 168 hours No results found for this basename: AMMONIA,  in the last 168 hours CBC:  Recent Labs Lab 10/19/13 1747 10/20/13 0505  WBC 12.7* 7.8  NEUTROABS 7.5  --   HGB 14.7 14.3  HCT 42.9 42.1  MCV 91.5 90.1  PLT 130* 124*   Cardiac Enzymes: No results found for this basename: CKTOTAL, CKMB, CKMBINDEX, TROPONINI,  in the last 168 hours BNP (last 3 results) No results found for this basename: PROBNP,  in the last 8760 hours CBG: No results found for this basename: GLUCAP,  in the last 168 hours  Recent Results (from the past 240 hour(s))  CULTURE, BLOOD (ROUTINE X 2)     Status: None   Collection Time    10/19/13  5:47 PM      Result Value Ref Range Status   Specimen Description BLOOD  RIGHT ANTECUBITAL   Final   Special Requests BOTTLES DRAWN AEROBIC AND ANAEROBIC 5ML   Final   Culture  Setup Time     Final   Value: 10/19/2013 20:20     Performed at [facility]   Culture     Final   Value: GRAM POSITIVE COCCI IN CLUSTERS     Note: Gram Stain Report Called to,Read Back By and Verified With: BRIANA RICHARDSON @ 0973 ON 100115 BY [facility]     Performed at [facility]   Report Status PENDING   Incomplete  CULTURE, BLOOD (ROUTINE X 2)     Status: None   Collection Time    10/19/13  5:56 PM      Result Value Ref Range Status   Specimen Description BLOOD LEFT ANTECUBITAL   Final   Special Requests BOTTLES DRAWN AEROBIC AND ANAEROBIC 5ML   Final   Culture  Setup Time     Final   Value: 10/19/2013 20:20     Performed at [facility]   Culture     Final   Value:        BLOOD CULTURE RECEIVED NO GROWTH TO DATE CULTURE WILL BE HELD FOR 5 DAYS BEFORE ISSUING A FINAL NEGATIVE REPORT     Performed at [facility]   Report Status PENDING   Incomplete  WOUND CULTURE     Status: None   Collection Time    10/19/13  6:01 PM      Result Value Ref Range Status   Specimen Description FOOT   Final   Special Requests Normal   Final   Gram Stain     Final   Value: ABUNDANT WBC PRESENT,BOTH PMN AND MONONUCLEAR     NO SQUAMOUS EPITHELIAL CELLS SEEN     RARE GRAM POSITIVE COCCI     IN PAIRS IN CLUSTERS     Performed at [facility]   Culture     Final   Value: MODERATE STAPHYLOCOCCUS AUREUS     Note: RIFAMPIN AND GENTAMICIN SHOULD NOT BE USED AS SINGLE DRUGS FOR TREATMENT OF STAPH INFECTIONS.     Performed at [facility]   Report Status 10/22/2013 FINAL   Final   Organism ID, Bacteria STAPHYLOCOCCUS AUREUS   Final  SURGICAL PCR SCREEN     Status: Abnormal   Collection Time    10/21/13 10:10 AM      Result Value Ref Range Status   MRSA, PCR NEGATIVE  NEGATIVE Final   Staphylococcus aureus POSITIVE (*) NEGATIVE Final   Comment:            The Xpert SA Assay (FDA     approved for NASAL specimens     in patients over 40 years of age),     is one component of     a comprehensive surveillance     program.  Test performance has     been validated by [employer] for patients greater     than or equal to 61 year old.     It is not intended     to diagnose infection nor to     guide or monitor treatment.     Studies: No results found.  Scheduled Meds: . amLODipine  10 mg Oral Daily  . docusate sodium  100 mg Oral BID  . docusate sodium  100 mg Oral BID  . enoxaparin (LOVENOX) injection  40 mg  Subcutaneous Q24H  . escitalopram  30 mg Oral Daily  . methylphenidate  20 mg Oral QAC breakfast  . metoprolol  50 mg Oral BID  . piperacillin-tazobactam (ZOSYN)  IV  3.375 g Intravenous Q8H   . vancomycin  1,000 mg Intravenous Q12H   Continuous Infusions: . sodium chloride    . sodium chloride      Active Problems:   Cellulitis and abscess of toe   Osteomyelitis of toe of left foot   Osteomyelitis    Time spent: >35 minutes     Kinnie Feil  [facility] Pager [phone]. If 7PM-7AM, please contact night-coverage at [email or web address], password [facility] 10/22/2013, 8:52 AM  LOS: 3 days

## 2013-10-22 NOTE — Evaluation (Signed)
Physical Therapy Evaluation Patient Details Name: William Solis MRN: [id number] DOB: 1950/04/06 Today's Date: 10/22/2013   History of Present Illness  63 y.o. male with PMH of HTN, Hep C, s/p spinal fusion, peripheral neuropathy, who presented to [facility] ED 10/19/2013 with ongoing left second toe infection for past 3 months prior to this admission. Is now s/p left second ray amputation.  Clinical Impression  Patient is seen following the above procedure and presents with functional limitations due to the deficits listed below (see PT Problem List). Able to safely complete stair training today, tolerating therapeutic activities and short distance ambulation - limited due to IV site bleeding. Over pt mobilizing fairly well and should progress quickly towards physical therapy goals. Will follow up in AM for further gait training but feel he will be adequate for d/c from a mobility standpoint after one more PT session, when pt is medically ready. Patient will benefit from skilled PT to increase their independence and safety with mobility to allow discharge to the venue listed below.       Follow Up Recommendations Supervision for mobility/OOB;No PT follow up    Equipment Recommendations  Rolling walker with 5" wheels;3in1 (PT) (Pt. declines wheelchair use at this time)    Recommendations for Other Services OT consult     Precautions / Restrictions Precautions Precautions: Fall Required Braces or Orthoses: Other Brace/Splint (post op shoe) Other Brace/Splint: post op shoe Restrictions Weight Bearing Restrictions: Yes LLE Weight Bearing: Touchdown weight bearing      Mobility  Bed Mobility Overal bed mobility: Needs Assistance Bed Mobility: Supine to Sit     Supine to sit: Supervision     General bed mobility comments: Supervision for safety. VC for technique. No physical assist needed  Transfers Overall transfer level: Needs assistance Equipment used: Rolling walker (2  wheeled) Transfers: Sit to/from Stand Sit to Stand: Supervision         General transfer comment: VC for hand placement with supervision for safety. good stability upon standing and maintains weight bearing status. Performed from lowest bed setting and reclining chair.  Ambulation/Gait Ambulation/Gait assistance: Supervision Ambulation Distance (Feet): 10 Feet (x2) Assistive device: Rolling walker (2 wheeled) Gait Pattern/deviations: Step-to pattern;Decreased step length - right;Decreased stance time - left;Antalgic   Gait velocity interpretation: Below normal speed for age/gender General Gait Details: educated on safe DME use with a rolling walker. VC to maintain weight bearing status. No loss of balance demonstrating good control of RW. Limited distance due to IV site bleeding.  Stairs Stairs: Yes Stairs assistance: Min assist Stair Management: No rails;Step to pattern;Backwards;With walker Number of Stairs: 2 (x3) General stair comments: Instructions and demonstration for stair training. Min assist provided for blocking walker with this technique. VC for sequencing. Pt able to teach back correctly on 3rd trail. States he feels confident with this task.  Wheelchair Mobility    Modified Rankin (Stroke Patients Only)       Balance Overall balance assessment: Needs assistance Sitting-balance support: No upper extremity supported;Feet supported Sitting balance-Leahy Scale: Good     Standing balance support: Bilateral upper extremity supported Standing balance-Leahy Scale: Poor                               Pertinent Vitals/Pain Pain Assessment: 0-10 Pain Score: 2  Pain Location: left foot and back Pain Descriptors / Indicators: Sharp Pain Intervention(s): Monitored during session;Repositioned;Premedicated before session    Home Living Family/patient  expects to be discharged to:: Private residence Living Arrangements: Spouse/significant other Available  Help at Discharge: Family;Available 24 hours/day Type of Home: House Home Access: Stairs to enter Entrance Stairs-Rails: Left Entrance Stairs-Number of Steps: 12 Home Layout: Two level;Able to live on main level with bedroom/bathroom Home Equipment: Cane - single point      Prior Function Level of Independence: Independent with assistive device(s)         Comments: used cane for ambulation intermittently     Hand Dominance   Dominant Hand: Right    Extremity/Trunk Assessment   Upper Extremity Assessment: Defer to OT evaluation           Lower Extremity Assessment: LLE deficits/detail   LLE Deficits / Details: Able to move toes, poor sensation with light touch - reports this is baseline. Limited assessment due to bandaging     Communication   Communication: No difficulties  Cognition Arousal/Alertness: Awake/alert Behavior During Therapy: WFL for tasks assessed/performed Overall Cognitive Status: Within Functional Limits for tasks assessed                      General Comments General comments (skin integrity, edema, etc.): IV site began to bleed on LUE - RN notified.    Exercises General Exercises - Lower Extremity Ankle Circles/Pumps: AROM;Both;10 reps;Seated Quad Sets: Strengthening;Both;10 reps;Seated      Assessment/Plan    PT Assessment Patient needs continued PT services  PT Diagnosis Difficulty walking;Abnormality of gait;Acute pain   PT Problem List Decreased strength;Decreased range of motion;Decreased activity tolerance;Decreased balance;Decreased mobility;Decreased knowledge of use of DME;Decreased knowledge of precautions;Pain;Impaired sensation  PT Treatment Interventions DME instruction;Gait training;Stair training;Functional mobility training;Therapeutic activities;Therapeutic exercise;Balance training;Neuromuscular re-education;Patient/family education;Modalities   PT Goals (Current goals can be found in the Care Plan section) Acute  Rehab PT Goals Patient Stated Goal: Go home PT Goal Formulation: With patient Time For Goal Achievement: 10/29/13 Potential to Achieve Goals: Good    Frequency Min 5X/week   Barriers to discharge        Co-evaluation               End of Session Equipment Utilized During Treatment: Gait belt;Other (comment) (post op shoe) Activity Tolerance: Patient tolerated treatment well Patient left: in chair;with call bell/phone within reach Nurse Communication: Mobility status;Other (comment) (IV site bleeding)         Time: 1751-0258 PT Time Calculation (min): 37 min   Charges:   PT Evaluation $Initial PT Evaluation Tier I: 1 Procedure PT Treatments $Gait Training: 8-22 mins $Therapeutic Activity: 8-22 mins   PT G Codes:        Elayne Snare, [place]   Ellouise Newer 10/22/2013, 1:18 PM

## 2013-10-22 NOTE — Care Management Note (Addendum)
CARE MANAGEMENT NOTE 10/22/2013  Patient:  LAMAR, METER   Account Number:  [id number]  Date Initiated:  10/22/2013  Documentation initiated by:  Ricki Miller  Subjective/Objective Assessment:   63 yr old male admitted with osteomyelitis of left second toe. 10/21/13 patient underwent surgery for amputation of left second toe.     Action/Plan:   Case manager spoke with patient and wife concerning home health and DME needs at discharge. Choice offered. They want [facility].     Anticipated DC Date:  10/24/2013   Anticipated DC Plan:  [place]  CM consult      PAC Choice  [place]   Choice offered to / List presented to:  C-1 Patient   DME arranged  [place]  3-N-1  Vassie Moselle      DME agency  [place] arranged  HH-1 RN      [place].   Status of service:  In process, will continue to follow Medicare Important Message given?   (If response is "NO", the following Medicare IM given date fields will be blank) Date Medicare IM given:   Medicare IM given by:   Date Additional Medicare IM given:   Additional Medicare IM given by:    Discharge Disposition:  [place]  Per UR Regulation:  Reviewed for med. necessity/level of care/duration of st

## 2013-10-22 NOTE — Consult Note (Signed)
[facility] for Infectious Disease  Date of Admission:  10/19/2013  Date of Consult:  10/22/2013  Reason for Consult:Staph bacteremia Referring Physician: CHAMP  Impression/Recommendation Staph bacteremia L 2nd ray amputation Osteomyelitis L 2nd toe Hepatitis C Neuropathy Previous Back surgery (denies hardware) at L4-5 with chronic back pain  Change anbx to ancef Place PIC Recheck BCx Check TEE MRI spine Check HIV He is under consideration of treatment of his Hep C via [place] to pt and his wife that if his Cx are negative, 2 weeks of IV ancef should suffice for his bacteremia. He has had ongoing infection for several months and risk of dissemination is clear.   Thank you so much for this interesting consult,   Bobby Rumpf (pager) [phone] [email or web address]  William Solis is an 63 y.o. male.  HPI: 63 yo M with hx of hepatitis C and neuropathy (due to previous back surgery), comes to hospital on 9-30 with 3 months of L 2nd toe infection. He was able ot probe bone at home during this period. He had been treated with augmentin however this was stopped on 9-21.  He had previous Cx done 8-25 which grew MSSA.  In ED he was afebrile, WBC was 12.7. He was started on vanco. His wound Cx grew MSSA as did 1/2 BCx. His plain filmis showed osteo. He underwent L 2nd ray amputation on 10-2.   Past Medical History  Diagnosis Date  . Hypertension   . Hepatitis C   . Neuropathy involving both lower extremities   . ADHD, adult residual type     Past Surgical History  Procedure Laterality Date  . Back surgery  2006    Disc  . Sinus exploration  2005/2007     No Known Allergies  Medications:  Scheduled: . amLODipine  10 mg Oral Daily  . docusate sodium  100 mg Oral BID  . docusate sodium  100 mg Oral BID  . enoxaparin (LOVENOX) injection  40 mg Subcutaneous Q24H  . escitalopram  30 mg Oral Daily  . methylphenidate  20 mg Oral QAC  breakfast  . metoprolol  50 mg Oral BID  . vancomycin  1,000 mg Intravenous Q12H    Abtx:  Anti-infectives   Start     Dose/Rate Route Frequency Ordered Stop   10/21/13 1800  piperacillin-tazobactam (ZOSYN) IVPB 3.375 g  Status:  Discontinued     3.375 g 12.5 mL/hr over 240 Minutes Intravenous Every 8 hours 10/21/13 1001 10/22/13 0855   10/21/13 1030  piperacillin-tazobactam (ZOSYN) IVPB 3.375 g     3.375 g 12.5 mL/hr over 240 Minutes Intravenous NOW 10/21/13 1001 10/21/13 1602   10/20/13 1000  vancomycin (VANCOCIN) IVPB 1000 mg/200 mL premix     1,000 mg 200 mL/hr over 60 Minutes Intravenous Every 12 hours 10/20/13 0639     10/19/13 2230  vancomycin (VANCOCIN) 1,500 mg in sodium chloride 0.9 % 500 mL IVPB     1,500 mg 250 mL/hr over 120 Minutes Intravenous  Once 10/19/13 2220 10/20/13 0206   10/19/13 1730  clindamycin (CLEOCIN) IVPB 600 mg     600 mg 100 mL/hr over 30 Minutes Intravenous  Once 10/19/13 1726 10/19/13 1837      Total days of antibiotics: 3 vanco          Social History:  reports that he quit smoking about 23 years ago. His smoking use included Cigarettes and Cigars. He smoked 0.00 packs per day. He  has never used smokeless tobacco. He reports that he does not drink alcohol or use illicit drugs.  Family History  Problem Relation Age of Onset  . Hypertension Mother   . Hypertension Father   . Diabetes type II Neg Hx     General ROS: unclear of etiology of his wound, no cough, no sob, no CP, normal BM, normal urination, f/c+ at home. States his back pain has been constant at L4-5 level. See HPI.   Blood pressure 122/68, pulse 70, temperature 98.4 F (36.9 C), temperature source Oral, resp. rate 14, height 6' (1.829 m), weight 102.059 kg (225 lb), SpO2 96.00%. General appearance: alert, cooperative and no distress Eyes: negative findings: conjunctivae and sclerae normal and pupils equal, round, reactive to light and accomodation Throat: normal findings: lips  normal without lesions Neck: no adenopathy and supple, symmetrical, trachea midline Lungs: clear to auscultation bilaterally Heart: regular rate and rhythm Abdomen: normal findings: bowel sounds normal and soft, non-tender Extremities: edema none. no lesions on R foot.  and L foot dressed. proximal erythema resolved at markings sites.  Neurologic: Sensory: grossly decreased BLE.    Results for orders placed during the hospital encounter of 10/19/13 (from the past 48 hour(s))  SURGICAL PCR SCREEN     Status: Abnormal   Collection Time    10/21/13 10:10 AM      Result Value Ref Range   MRSA, PCR NEGATIVE  NEGATIVE   Staphylococcus aureus POSITIVE (*) NEGATIVE   Comment:            The Xpert SA Assay (FDA     approved for NASAL specimens     in patients over 31 years of age),     is one component of     a comprehensive surveillance     program.  Test performance has     been validated by [employer] for patients greater     than or equal to 81 year old.     It is not intended     to diagnose infection nor to     guide or monitor treatment.      Component Value Date/Time   SDES FOOT 10/19/2013 1801   SPECREQUEST Normal 10/19/2013 1801   CULT  Value: MODERATE STAPHYLOCOCCUS AUREUS Note: RIFAMPIN AND GENTAMICIN SHOULD NOT BE USED AS SINGLE DRUGS FOR TREATMENT OF STAPH INFECTIONS. Performed at [facility] 10/19/2013 1801   REPTSTATUS 10/22/2013 FINAL 10/19/2013 1801   No results found. Recent Results (from the past 240 hour(s))  CULTURE, BLOOD (ROUTINE X 2)     Status: None   Collection Time    10/19/13  5:47 PM      Result Value Ref Range Status   Specimen Description BLOOD RIGHT ANTECUBITAL   Final   Special Requests BOTTLES DRAWN AEROBIC AND ANAEROBIC 5ML   Final   Culture  Setup Time     Final   Value: 10/19/2013 20:20     Performed at [facility]   Culture     Final   Value: STAPHYLOCOCCUS AUREUS     Note: RIFAMPIN AND GENTAMICIN SHOULD NOT BE USED AS  SINGLE DRUGS FOR TREATMENT OF STAPH INFECTIONS.     Note: Gram Stain Report Called to,Read Back By and Verified With: BRIANA RICHARDSON @ [id number] ON [id number] BY [facility]     Performed at [facility]   Report Status 10/22/2013 FINAL   Final   Organism ID, Bacteria STAPHYLOCOCCUS AUREUS  Final  CULTURE, BLOOD (ROUTINE X 2)     Status: None   Collection Time    10/19/13  5:56 PM      Result Value Ref Range Status   Specimen Description BLOOD LEFT ANTECUBITAL   Final   Special Requests BOTTLES DRAWN AEROBIC AND ANAEROBIC 5ML   Final   Culture  Setup Time     Final   Value: 10/19/2013 20:20     Performed at [facility]   Culture     Final   Value:        BLOOD CULTURE RECEIVED NO GROWTH TO DATE CULTURE WILL BE HELD FOR 5 DAYS BEFORE ISSUING A FINAL NEGATIVE REPORT     Performed at [facility]   Report Status PENDING   Incomplete  WOUND CULTURE     Status: None   Collection Time    10/19/13  6:01 PM      Result Value Ref Range Status   Specimen Description FOOT   Final   Special Requests Normal   Final   Gram Stain     Final   Value: ABUNDANT WBC PRESENT,BOTH PMN AND MONONUCLEAR     NO SQUAMOUS EPITHELIAL CELLS SEEN     RARE GRAM POSITIVE COCCI     IN PAIRS IN CLUSTERS     Performed at [facility]   Culture     Final   Value: MODERATE STAPHYLOCOCCUS AUREUS     Note: RIFAMPIN AND GENTAMICIN SHOULD NOT BE USED AS SINGLE DRUGS FOR TREATMENT OF STAPH INFECTIONS.     Performed at [facility]   Report Status 10/22/2013 FINAL   Final   Organism ID, Bacteria STAPHYLOCOCCUS AUREUS   Final  SURGICAL PCR SCREEN     Status: Abnormal   Collection Time    10/21/13 10:10 AM      Result Value Ref Range Status   MRSA, PCR NEGATIVE  NEGATIVE Final   Staphylococcus aureus POSITIVE (*) NEGATIVE Final   Comment:            The Xpert SA Assay (FDA     approved for NASAL specimens     in patients over 63 years of age),     is one component of     a  comprehensive surveillance     program.  Test performance has     been validated by [employer] for patients greater     than or equal to 24 year old.     It is not intended     to diagnose infection nor to     guide or monitor treatment.      10/22/2013, 12:29 PM     LOS: 3 days         [facility] Staphylococcus aureus bacteremia   Staphylococcus aureus bacteremia (SAB) is associated with a high rate of complications and mortality.  Specific aspects of clinical management are critical to optimizing the outcome of patients with SAB.  Therefore, the [facility] Antimicrobial Management Team [facility]) has initiated an intervention aimed at improving the management of SAB at [facility].  To do so, Infectious Diseases physicians are providing an evidence-based consult for the management of all patients with SAB.     Yes No Comments  Perform follow-up blood cultures (even if the patient is afebrile) to ensure clearance of bacteremia [x]  []    Remove vascular catheter and obtain follow-up blood cultures after  the removal of the catheter []  []    Perform echocardiography to evaluate for endocarditis (transthoracic ECHO is 40-50% sensitive, TEE is > 90% sensitive) []  []  Please keep in mind, that neither test can definitively EXCLUDE endocarditis, and that should clinical suspicion remain high for endocarditis the patient should then still be treated with an "endocarditis" duration of therapy = 6 weeks  Consult electrophysiologist to evaluate implanted cardiac device (pacemaker, ICD) []  []    Ensure source control [x]  []  Have all abscesses been drained effectively? Have deep seeded infections (septic joints or osteomyelitis) had appropriate surgical debridement?  Investigate for "metastatic" sites of infection [x]  []  Does the patient have ANY symptom or physical exam finding that would suggest a deeper infection (back or neck pain that may be suggestive of  vertebral osteomyelitis or epidural abscess, muscle pain that could be a symptom of pyomyositis)?  Keep in mind that for deep seeded infections MRI imaging with contrast is preferred rather than other often insensitive tests such as plain x-rays, especially early in a patient's presentation.  Change antibiotic therapy to __________________ [x]  []  Beta-lactam antibiotics are preferred for MSSA due to higher cure rates.   If on Vancomycin, goal trough should be 15 - 20 mcg/mL  Estimated duration of IV antibiotic therapy:   []  []  Consult case management for probably prolonged outpatient IV antibiotic therapy

## 2013-10-22 NOTE — Progress Notes (Signed)
Peripherally Inserted Central Catheter/Midline Placement  The IV Nurse has discussed with the patient and/or persons authorized to consent for the patient, the purpose of this procedure and the potential benefits and risks involved with this procedure.  The benefits include less needle sticks, lab draws from the catheter and patient may be discharged home with the catheter.  Risks include, but not limited to, infection, bleeding, blood clot (thrombus formation), and puncture of an artery; nerve damage and irregular heat beat.  Alternatives to this procedure were also discussed.  PICC/Midline Placement Documentation  PICC / Midline Single Lumen 16/01/09 PICC Right Basilic 40 cm 0 cm (Active)  Indication for Insertion or Continuance of Line Home intravenous therapies (PICC only) 10/22/2013  3:36 PM  Exposed Catheter (cm) 0 cm 10/22/2013  3:36 PM  Site Assessment Clean;Dry;Intact 10/22/2013  3:36 PM  Line Status Flushed;Saline locked;Blood return noted 10/22/2013  3:36 PM  Dressing Type Transparent 10/22/2013  3:36 PM  Dressing Status Clean;Dry;Intact;Antimicrobial disc in place 10/22/2013  3:36 PM  Line Care Connections checked and tightened 10/22/2013  3:36 PM  Line Adjustment (NICU/IV Team Only) No 10/22/2013  3:36 PM  Dressing Intervention New dressing 10/22/2013  3:36 PM  Dressing Change Due 10/29/13 10/22/2013  3:36 PM       Rolena Infante 10/22/2013, 3:36 PM

## 2013-10-22 NOTE — Progress Notes (Signed)
Subjective: 1 Day Post-Op Procedure(s) (LRB): LEFT FOOT SECOND RAY AMPUTATION  (Left) Patient reports pain as mild.    Objective: Vital signs in last 24 hours: Temp:  [97.4 F (36.3 C)-98.8 F (37.1 C)] 98.4 F (36.9 C) (10/03 0540) Pulse Rate:  [60-71] 70 (10/03 0540) Resp:  [10-16] 14 (10/03 0540) BP: (121-138)/(65-85) 122/68 mmHg (10/03 0540) SpO2:  [94 %-100 %] 96 % (10/03 0540)  Intake/Output from previous day: 10/02 0701 - 10/03 0700 In: 580 [P.O.:480; I.V.:100] Out: 550 [Urine:550] Intake/Output this shift:     Recent Labs  10/19/13 1747 10/20/13 0505  HGB 14.7 14.3    Recent Labs  10/19/13 1747 10/20/13 0505  WBC 12.7* 7.8  RBC 4.69 4.67  HCT 42.9 42.1  PLT 130* 124*    Recent Labs  10/19/13 1747 10/20/13 0505  NA 134* 140  K 4.0 3.9  CL 97 104  CO2 28 27  BUN 23 23  CREATININE 0.98 0.90  GLUCOSE 91 99  CALCIUM 11.0* 10.7*    Recent Labs  10/20/13 0505  INR 1.08    Incision: dressing C/D/I  Assessment/Plan: 1 Day Post-Op Procedure(s) (LRB): LEFT FOOT SECOND RAY AMPUTATION  (Left) Continue IV antibiotics Hope for D/C tomorrow  [facility] 10/22/2013, 9:58 AM

## 2013-10-23 ENCOUNTER — Inpatient Hospital Stay (HOSPITAL_COMMUNITY): Payer: BC Managed Care – PPO

## 2013-10-23 DIAGNOSIS — R7881 Bacteremia: Secondary | ICD-10-CM

## 2013-10-23 DIAGNOSIS — B9561 Methicillin susceptible Staphylococcus aureus infection as the cause of diseases classified elsewhere: Secondary | ICD-10-CM

## 2013-10-23 LAB — CULTURE, BLOOD (ROUTINE X 2)

## 2013-10-23 MED ORDER — GADOBENATE DIMEGLUMINE 529 MG/ML IV SOLN
20.0000 mL | Freq: Once | INTRAVENOUS | Status: AC | PRN
  Administered 2013-10-23: 20 mL via INTRAVENOUS

## 2013-10-23 MED ORDER — LISINOPRIL 20 MG PO TABS
20.0000 mg | ORAL_TABLET | Freq: Every day | ORAL | Status: DC
  Administered 2013-10-23 – 2013-10-24 (×2): 20 mg via ORAL
  Filled 2013-10-23 (×2): qty 1

## 2013-10-23 NOTE — Progress Notes (Signed)
INFECTIOUS DISEASE PROGRESS NOTE  ID: William Solis is a 63 y.o. male with  Active Problems:   Cellulitis and abscess of toe   Osteomyelitis of toe of left foot   Osteomyelitis  Subjective: No events. Pt in MRI  Abtx:  Anti-infectives   Start     Dose/Rate Route Frequency Ordered Stop   10/22/13 1400  ceFAZolin (ANCEF) IVPB 2 g/50 mL premix     2 g 100 mL/hr over 30 Minutes Intravenous 3 times per day 10/22/13 1257     10/21/13 1800  piperacillin-tazobactam (ZOSYN) IVPB 3.375 g  Status:  Discontinued     3.375 g 12.5 mL/hr over 240 Minutes Intravenous Every 8 hours 10/21/13 1001 10/22/13 0855   10/21/13 1030  piperacillin-tazobactam (ZOSYN) IVPB 3.375 g     3.375 g 12.5 mL/hr over 240 Minutes Intravenous NOW 10/21/13 1001 10/21/13 1602   10/20/13 1000  vancomycin (VANCOCIN) IVPB 1000 mg/200 mL premix  Status:  Discontinued     1,000 mg 200 mL/hr over 60 Minutes Intravenous Every 12 hours 10/20/13 0639 10/22/13 1257   10/19/13 2230  vancomycin (VANCOCIN) 1,500 mg in sodium chloride 0.9 % 500 mL IVPB     1,500 mg 250 mL/hr over 120 Minutes Intravenous  Once 10/19/13 2220 10/20/13 0206   10/19/13 1730  clindamycin (CLEOCIN) IVPB 600 mg     600 mg 100 mL/hr over 30 Minutes Intravenous  Once 10/19/13 1726 10/19/13 1837      Medications:  Scheduled: . amLODipine  10 mg Oral Daily  .  ceFAZolin (ANCEF) IV  2 g Intravenous 3 times per day  . docusate sodium  100 mg Oral BID  . docusate sodium  100 mg Oral BID  . enoxaparin (LOVENOX) injection  40 mg Subcutaneous Q24H  . escitalopram  30 mg Oral Daily  . lisinopril  20 mg Oral Daily  . methylphenidate  20 mg Oral QAC breakfast  . metoprolol  50 mg Oral BID    Objective: Vital signs in last 24 hours: Temp:  [98 F (36.7 C)-98.2 F (36.8 C)] 98.2 F (36.8 C) (10/04 0610) Pulse Rate:  [78-88] 78 (10/04 0610) Resp:  [14-16] 16 (10/04 0610) BP: (133-151)/(78-90) 151/90 mmHg (10/04 0610) SpO2:  [93 %-97 %] 97 %  (10/04 0610)   no exam, pt in MRI  Lab Results No results found for this basename: WBC, HGB, HCT, PLATELETS, NA, K, CL, CO2, BUN, CREATININE, GLU,  in the last 72 hours Liver Panel No results found for this basename: PROT, ALBUMIN, AST, ALT, ALKPHOS, BILITOT, BILIDIR, IBILI,  in the last 72 hours Sedimentation Rate No results found for this basename: ESRSEDRATE,  in the last 72 hours C-Reactive Protein No results found for this basename: CRP,  in the last 72 hours  Microbiology: Recent Results (from the past 240 hour(s))  CULTURE, BLOOD (ROUTINE X 2)     Status: None   Collection Time    10/19/13  5:47 PM      Result Value Ref Range Status   Specimen Description BLOOD RIGHT ANTECUBITAL   Final   Special Requests BOTTLES DRAWN AEROBIC AND ANAEROBIC 5ML   Final   Culture  Setup Time     Final   Value: 10/19/2013 20:20     Performed at [facility]   Culture     Final   Value: STAPHYLOCOCCUS AUREUS     Note: RIFAMPIN AND GENTAMICIN SHOULD NOT BE USED AS SINGLE DRUGS FOR TREATMENT OF  STAPH INFECTIONS.     Note: Gram Stain Report Called to,Read Back By and Verified With: BRIANA RICHARDSON @ 0109 ON 100115 BY [facility]     Performed at [facility]   Report Status 10/22/2013 FINAL   Final   Organism ID, Bacteria STAPHYLOCOCCUS AUREUS   Final  CULTURE, BLOOD (ROUTINE X 2)     Status: None   Collection Time    10/19/13  5:56 PM      Result Value Ref Range Status   Specimen Description BLOOD LEFT ANTECUBITAL   Final   Special Requests BOTTLES DRAWN AEROBIC AND ANAEROBIC 5ML   Final   Culture  Setup Time     Final   Value: 10/19/2013 20:20     Performed at [facility]   Culture     Final   Value: STAPHYLOCOCCUS AUREUS     Note: SUSCEPTIBILITIES PERFORMED ON PREVIOUS CULTURE WITHIN THE LAST 5 DAYS.     Note: Gram Stain Report Called to,Read Back By and Verified With: ELENA PEREZ 10/22/13 @ 1239PM BY [facility].     Performed at [facility]   Report Status  10/23/2013 FINAL   Final  WOUND CULTURE     Status: None   Collection Time    10/19/13  6:01 PM      Result Value Ref Range Status   Specimen Description FOOT   Final   Special Requests Normal   Final   Gram Stain     Final   Value: ABUNDANT WBC PRESENT,BOTH PMN AND MONONUCLEAR     NO SQUAMOUS EPITHELIAL CELLS SEEN     RARE GRAM POSITIVE COCCI     IN PAIRS IN CLUSTERS     Performed at [facility]   Culture     Final   Value: MODERATE STAPHYLOCOCCUS AUREUS     Note: RIFAMPIN AND GENTAMICIN SHOULD NOT BE USED AS SINGLE DRUGS FOR TREATMENT OF STAPH INFECTIONS.     Performed at [facility]   Report Status 10/22/2013 FINAL   Final   Organism ID, Bacteria STAPHYLOCOCCUS AUREUS   Final  SURGICAL PCR SCREEN     Status: Abnormal   Collection Time    10/21/13 10:10 AM      Result Value Ref Range Status   MRSA, PCR NEGATIVE  NEGATIVE Final   Staphylococcus aureus POSITIVE (*) NEGATIVE Final   Comment:            The Xpert SA Assay (FDA     approved for NASAL specimens     in patients over 46 years of age),     is one component of     a comprehensive surveillance     program.  Test performance has     been validated by [employer] for patients greater     than or equal to 66 year old.     It is not intended     to diagnose infection nor to     guide or monitor treatment.  CULTURE, BLOOD (ROUTINE X 2)     Status: None   Collection Time    10/22/13  2:40 PM      Result Value Ref Range Status   Specimen Description BLOOD RIGHT ARM   Final   Special Requests BOTTLES DRAWN AEROBIC AND ANAEROBIC 10 CC   Final   Culture  Setup Time     Final   Value: 10/22/2013 20:32  Performed at [facility]     Final   Value:        BLOOD CULTURE RECEIVED NO GROWTH TO DATE CULTURE WILL BE HELD FOR 5 DAYS BEFORE ISSUING A FINAL NEGATIVE REPORT     Performed at [facility]   Report Status PENDING   Incomplete    Studies/Results: No results  found.   Assessment/Plan: MSSA Bacteremia Osteomylitis L 2nd toe  Ray amputation L 2nd toe 10-2  Back pain, prev L4-5 surgery  Total days of antibiotics: 4 ancef  Repeat BCx is ngtd Await MRI of L spine Await TEE My appreciation to Dr Arvil Persons Infectious Diseases (pager) [phone] [email or web address] 10/23/2013, 11:59 AM  LOS: 4 days

## 2013-10-23 NOTE — Progress Notes (Signed)
[facility] PROGRESS NOTE  William Solis [id number]:[id number] DOB: 06-Mar-1950 DOA: 10/19/2013 PCP: Leamon Arnt, MD  Assessment/Plan: 63 y.o. male with PMH of HTN, Hep C, s/p spinal fusion, peripheral neuropathy, who presented to [facility] ED 10/19/2013 with ongoing left second toe infection for past 3 months prior to this admission. Patient was on Augmentin given to him by podiatrist and he has completed the treatment with it 10/10/2013. -admitted with cellulitis, osteomyelitis   1. Acute osteomyelitis of the left second toe / Cellulitis / Leukocytosis  -10/2: s/p 2nd toe amputation; per ortho  2. Bacteremia; +staph aureus; appreciate ID input; pend spinal MRI;  -PICC, started IV atx (2 weeks of IV ancef) last dose 10/17;  -called cardiology to scheduled for TEE in AM  3. Hypertension; cont Norvasc 10 mg daily, hold lisinopril 40 mg periop; change metoprolol 50 mg BID;  4. Hypercalcemia Likely due to possible dehydration, acute infection +bone destruction+HCTZ  -resolving; .  5. Thrombocytopenia; Mild, platelet count 130, 124.; no signs of bleed.    DVT Prophylaxis start SQ lovenox   Code Status: full Family Communication:  D/w patient,  (indicate person spoken with, relationship, and if by phone, the number) Disposition Plan: home  Likely in 24-48 hrs; pend TEE   Consultants:  ortho  Procedures:  Pend surgery   Antibiotics:  vanc 9/30<<<<   (indicate start date, and stop date if known)  HPI/Subjective: alert  Objective: Filed Vitals:   10/23/13 0610  BP: 151/90  Pulse: 78  Temp: 98.2 F (36.8 C)  Resp: 16    Intake/Output Summary (Last 24 hours) at 10/23/13 1010 Last data filed at 10/23/13 0904  Gross per 24 hour  Intake   1060 ml  Output   1650 ml  Net   -590 ml   Filed Weights   10/20/13 0519  Weight: 102.059 kg (225 lb)    Exam:   General:  alert  Cardiovascular: s1,s2 rrr  Respiratory: CTA BL  Abdomen: soft, nt,nd  Musculoskeletal: foot  post op dressed      Data Reviewed: Basic Metabolic Panel:  Recent Labs Lab 10/19/13 1747 10/20/13 0505  NA 134* 140  K 4.0 3.9  CL 97 104  CO2 28 27  GLUCOSE 91 99  BUN 23 23  CREATININE 0.98 0.90  CALCIUM 11.0* 10.7*  MG  --  1.8  PHOS  --  2.0*   Liver Function Tests:  Recent Labs Lab 10/19/13 1747 10/20/13 0505  AST 67* 61*  ALT 89* 78*  ALKPHOS 65 62  BILITOT 1.0 0.8  PROT 7.9 7.1  ALBUMIN 3.2* 2.8*   No results found for this basename: LIPASE, AMYLASE,  in the last 168 hours No results found for this basename: AMMONIA,  in the last 168 hours CBC:  Recent Labs Lab 10/19/13 1747 10/20/13 0505  WBC 12.7* 7.8  NEUTROABS 7.5  --   HGB 14.7 14.3  HCT 42.9 42.1  MCV 91.5 90.1  PLT 130* 124*   Cardiac Enzymes: No results found for this basename: CKTOTAL, CKMB, CKMBINDEX, TROPONINI,  in the last 168 hours BNP (last 3 results) No results found for this basename: PROBNP,  in the last 8760 hours CBG: No results found for this basename: GLUCAP,  in the last 168 hours  Recent Results (from the past 240 hour(s))  CULTURE, BLOOD (ROUTINE X 2)     Status: None   Collection Time    10/19/13  5:47 PM      Result  Value Ref Range Status   Specimen Description BLOOD RIGHT ANTECUBITAL   Final   Special Requests BOTTLES DRAWN AEROBIC AND ANAEROBIC 5ML   Final   Culture  Setup Time     Final   Value: 10/19/2013 20:20     Performed at [facility]   Culture     Final   Value: STAPHYLOCOCCUS AUREUS     Note: RIFAMPIN AND GENTAMICIN SHOULD NOT BE USED AS SINGLE DRUGS FOR TREATMENT OF STAPH INFECTIONS.     Note: Gram Stain Report Called to,Read Back By and Verified With: BRIANA RICHARDSON @ 2671 ON 100115 BY [facility]     Performed at [facility]   Report Status 10/22/2013 FINAL   Final   Organism ID, Bacteria STAPHYLOCOCCUS AUREUS   Final  CULTURE, BLOOD (ROUTINE X 2)     Status: None   Collection Time    10/19/13  5:56 PM      Result Value Ref  Range Status   Specimen Description BLOOD LEFT ANTECUBITAL   Final   Special Requests BOTTLES DRAWN AEROBIC AND ANAEROBIC 5ML   Final   Culture  Setup Time     Final   Value: 10/19/2013 20:20     Performed at [facility]   Culture     Final   Value: GRAM POSITIVE COCCI IN CLUSTERS     Note: Gram Stain Report Called to,Read Back By and Verified With: ELENA PEREZ 10/22/13 @ 1239PM BY RUSCOE A.     Performed at [facility]   Report Status PENDING   Incomplete  WOUND CULTURE     Status: None   Collection Time    10/19/13  6:01 PM      Result Value Ref Range Status   Specimen Description FOOT   Final   Special Requests Normal   Final   Gram Stain     Final   Value: ABUNDANT WBC PRESENT,BOTH PMN AND MONONUCLEAR     NO SQUAMOUS EPITHELIAL CELLS SEEN     RARE GRAM POSITIVE COCCI     IN PAIRS IN CLUSTERS     Performed at [facility]   Culture     Final   Value: MODERATE STAPHYLOCOCCUS AUREUS     Note: RIFAMPIN AND GENTAMICIN SHOULD NOT BE USED AS SINGLE DRUGS FOR TREATMENT OF STAPH INFECTIONS.     Performed at [facility]   Report Status 10/22/2013 FINAL   Final   Organism ID, Bacteria STAPHYLOCOCCUS AUREUS   Final  SURGICAL PCR SCREEN     Status: Abnormal   Collection Time    10/21/13 10:10 AM      Result Value Ref Range Status   MRSA, PCR NEGATIVE  NEGATIVE Final   Staphylococcus aureus POSITIVE (*) NEGATIVE Final   Comment:            The Xpert SA Assay (FDA     approved for NASAL specimens     in patients over 24 years of age),     is one component of     a comprehensive surveillance     program.  Test performance has     been validated by [employer] for patients greater     than or equal to 27 year old.     It is not intended     to diagnose infection nor to     guide or monitor treatment.  CULTURE, BLOOD (ROUTINE X 2)  Status: None   Collection Time    10/22/13  2:40 PM      Result Value Ref Range Status   Specimen  Description BLOOD RIGHT ARM   Final   Special Requests BOTTLES DRAWN AEROBIC AND ANAEROBIC 10 CC   Final   Culture  Setup Time     Final   Value: 10/22/2013 20:32     Performed at [facility]   Culture     Final   Value:        BLOOD CULTURE RECEIVED NO GROWTH TO DATE CULTURE WILL BE HELD FOR 5 DAYS BEFORE ISSUING A FINAL NEGATIVE REPORT     Performed at [facility]   Report Status PENDING   Incomplete     Studies: No results found.  Scheduled Meds: . amLODipine  10 mg Oral Daily  .  ceFAZolin (ANCEF) IV  2 g Intravenous 3 times per day  . docusate sodium  100 mg Oral BID  . docusate sodium  100 mg Oral BID  . enoxaparin (LOVENOX) injection  40 mg Subcutaneous Q24H  . escitalopram  30 mg Oral Daily  . methylphenidate  20 mg Oral QAC breakfast  . metoprolol  50 mg Oral BID   Continuous Infusions: . sodium chloride 10 mL/hr at 10/23/13 7915    Active Problems:   Cellulitis and abscess of toe   Osteomyelitis of toe of left foot   Osteomyelitis    Time spent: >35 minutes     Kinnie Feil  [facility] Pager [phone]. If 7PM-7AM, please contact night-coverage at [email or web address], password [facility] 10/23/2013, 10:10 AM  LOS: 4 days

## 2013-10-23 NOTE — Progress Notes (Signed)
Physical Therapy Treatment Patient Details Name: William Solis MRN: [id number] DOB: [birth year] Today's Date: 10/23/2013    History of Present Illness 63 y.o. male with PMH of HTN, Hep C, s/p spinal fusion, peripheral neuropathy, who presented to [facility] ED 10/19/2013 with ongoing left second toe infection for past 3 months prior to this admission. Is now s/p left second ray amputation.    PT Comments    Patient is progressing well towards physical therapy goals, ambulating up to 125 feet at a supervision level with a rolling walker. Tolerating therapeutic exercises well. Reviewed precautions and safety with mobility. Will continue to follow acutely.   Follow Up Recommendations  Supervision for mobility/OOB;No PT follow up     Equipment Recommendations  Rolling walker with 5" wheels;3in1 (PT)    Recommendations for Other Services OT consult     Precautions / Restrictions Precautions Precautions: Fall Required Braces or Orthoses: Other Brace/Splint (post op shoe) Other Brace/Splint: post op shoe Restrictions Weight Bearing Restrictions: Yes LLE Weight Bearing: Touchdown weight bearing    Mobility  Bed Mobility Overal bed mobility: Needs Assistance;Modified Independent                Transfers Overall transfer level: Needs assistance Equipment used: Rolling walker (2 wheeled) Transfers: Sit to/from Stand Sit to Stand: Supervision         General transfer comment: Supervision for safety. VC for hand placement on stable surface to rise. Good stability upon standing  Ambulation/Gait Ambulation/Gait assistance: Supervision Ambulation Distance (Feet): 125 Feet Assistive device: Rolling walker (2 wheeled) Gait Pattern/deviations: Step-to pattern;Decreased step length - right;Decreased stance time - left;Antalgic;Trunk flexed   Gait velocity interpretation: Below normal speed for age/gender General Gait Details: VC for upright posture and focused on walker placement  within base of support. Intermittent cues for safety with weight bearing status on LLE. No loss of balance noted during bout.   Stairs            Wheelchair Mobility    Modified Rankin (Stroke Patients Only)       Balance                                    Cognition Arousal/Alertness: Awake/alert Behavior During Therapy: WFL for tasks assessed/performed Overall Cognitive Status: Within Functional Limits for tasks assessed                      Exercises General Exercises - Lower Extremity Ankle Circles/Pumps: AROM;Both;Seated;5 reps Quad Sets: Strengthening;10 reps;Seated;Left Long Arc Quad: Strengthening;Left;10 reps;Seated Heel Slides: AAROM;Left;10 reps;Seated Hip Flexion/Marching: Strengthening;Left;10 reps;Seated    General Comments General comments (skin integrity, edema, etc.): Complains of pain at base of thumbs bilaterally while using RW - adjusted RW by taping hand towels for improved shock absorption.      Pertinent Vitals/Pain Pain Assessment: 0-10 Pain Score:  (No numerical value given. "Feel okay") Pain Location: left foot Pain Intervention(s): Monitored during session;Repositioned    Home Living                      Prior Function            PT Goals (current goals can now be found in the care plan section) Acute Rehab PT Goals Patient Stated Goal: Go home PT Goal Formulation: With patient Time For Goal Achievement: 10/29/13 Potential to Achieve Goals: Good Progress towards PT goals: Progressing  toward goals    Frequency  Min 5X/week    PT Plan      Co-evaluation             End of Session Equipment Utilized During Treatment: Gait belt;Other (comment) (post op shoe) Activity Tolerance: Patient tolerated treatment well Patient left: in chair;with call bell/phone within reach     Time: 0904-0930 PT Time Calculation (min): 26 min  Charges:  $Gait Training: 8-22 mins $Therapeutic Exercise: 8-22  mins                    G Codes:      [employer], [place]  Ellouise Newer 10/23/2013, 9:54 AM

## 2013-10-23 NOTE — Progress Notes (Signed)
Dressing c/d/i Cellulitis of calf resolved Calf nontender MRI and TEE pending per ID recs, appreciate assistance Up with PT today  N. Eduard Roux, MD [facility] [phone] 8:31 AM

## 2013-10-23 NOTE — Progress Notes (Signed)
    [facility] has been requested to perform a transesophageal echocardiogram on William Solis to r/o edndocarditis.  After careful review of history and examination, the risks and benefits of transesophageal echocardiogram have been explained including risks of esophageal damage, perforation (1:10,000 risk), bleeding, pharyngeal hematoma as well as other potential complications associated with conscious sedation including aspiration, arrhythmia, respiratory failure and death. All questions were answered. Patient is willing to proceed.   Erlene Quan, PA 10/23/2013 3:02 PM   Agree with above. Candee Furbish, MD

## 2013-10-24 ENCOUNTER — Encounter (HOSPITAL_COMMUNITY)
Admission: EM | Disposition: A | Discharge status: Home Health | Payer: Self-pay | Source: Home / Self Care | Attending: Internal Medicine

## 2013-10-24 ENCOUNTER — Encounter (HOSPITAL_COMMUNITY): Payer: Self-pay | Admitting: *Deleted

## 2013-10-24 DIAGNOSIS — I341 Nonrheumatic mitral (valve) prolapse: Secondary | ICD-10-CM

## 2013-10-24 HISTORY — PX: TEE WITHOUT CARDIOVERSION: SHX5443

## 2013-10-24 SURGERY — ECHOCARDIOGRAM, TRANSESOPHAGEAL
Anesthesia: Moderate Sedation

## 2013-10-24 MED ORDER — DIPHENHYDRAMINE HCL 50 MG/ML IJ SOLN
INTRAMUSCULAR | Status: DC | PRN
  Administered 2013-10-24: 25 mg via INTRAVENOUS

## 2013-10-24 MED ORDER — LIDOCAINE VISCOUS 2 % MT SOLN
OROMUCOSAL | Status: DC | PRN
  Administered 2013-10-24: 10 mL via OROMUCOSAL

## 2013-10-24 MED ORDER — SODIUM CHLORIDE 0.9 % IV SOLN: INTRAVENOUS | Status: DC

## 2013-10-24 MED ORDER — METOPROLOL TARTRATE 50 MG PO TABS: 50.0000 mg | ORAL_TABLET | Freq: Two times a day (BID) | ORAL | Status: DC

## 2013-10-24 MED ORDER — FENTANYL CITRATE 0.05 MG/ML IJ SOLN
INTRAMUSCULAR | Status: DC | PRN
  Administered 2013-10-24: 25 ug via INTRAVENOUS
  Administered 2013-10-24: 12.5 ug via INTRAVENOUS
  Administered 2013-10-24: 25 ug via INTRAVENOUS
  Administered 2013-10-24: 12.5 ug via INTRAVENOUS

## 2013-10-24 MED ORDER — OXYCODONE HCL 5 MG PO CAPS
10.0000 mg | ORAL_CAPSULE | ORAL | Status: DC | PRN
Start: 2013-10-24 — End: 2017-03-09

## 2013-10-24 MED ORDER — FENTANYL CITRATE 0.05 MG/ML IJ SOLN
INTRAMUSCULAR | Status: AC
  Filled 2013-10-24: qty 2

## 2013-10-24 MED ORDER — MIDAZOLAM HCL 10 MG/2ML IJ SOLN
INTRAMUSCULAR | Status: DC | PRN
  Administered 2013-10-24: 2 mg via INTRAVENOUS
  Administered 2013-10-24 (×2): 1 mg via INTRAVENOUS
  Administered 2013-10-24: 2 mg via INTRAVENOUS

## 2013-10-24 MED ORDER — MIDAZOLAM HCL 5 MG/ML IJ SOLN
INTRAMUSCULAR | Status: AC
  Filled 2013-10-24: qty 2

## 2013-10-24 MED ORDER — DIPHENHYDRAMINE HCL 50 MG/ML IJ SOLN
INTRAMUSCULAR | Status: AC
  Filled 2013-10-24: qty 1

## 2013-10-24 MED ORDER — HEPARIN SOD (PORK) LOCK FLUSH 100 UNIT/ML IV SOLN
250.0000 [IU] | INTRAVENOUS | Status: AC | PRN
  Administered 2013-10-24: 250 [IU]

## 2013-10-24 MED ORDER — LIDOCAINE VISCOUS 2 % MT SOLN
OROMUCOSAL | Status: AC
  Filled 2013-10-24: qty 15

## 2013-10-24 MED ORDER — CEFAZOLIN SODIUM-DEXTROSE 2-3 GM-% IV SOLR: 2.0000 g | Freq: Three times a day (TID) | INTRAVENOUS | Status: DC

## 2013-10-24 MED ORDER — CLONAZEPAM 0.5 MG PO TABS: 0.5000 mg | ORAL_TABLET | Freq: Two times a day (BID) | ORAL | Status: DC | PRN

## 2013-10-24 NOTE — Discharge Summary (Signed)
Physician Discharge Summary  William Solis VQX:[id number] DOB: [birth year] DOA: 10/19/2013  PCP: Leamon Arnt, MD  Admit date: 10/19/2013 Discharge date: 10/24/2013  Time spent: >35 minutes  Recommendations for Outpatient Follow-up:  [place] IV atx  F/u with Dr. Sharol Given in 1 week  F/u with PCP in 1 week as needed  Discharge Diagnoses:  Active Problems:   Cellulitis and abscess of toe   Osteomyelitis of toe of left foot   Osteomyelitis   Discharge Condition: stable   Diet recommendation: low sodium   Filed Weights   10/20/13 0519  Weight: 102.059 kg (225 lb)    History of present illness:  63 y.o. male with PMH of HTN, Hep C, s/p spinal fusion, peripheral neuropathy, who presented to [facility] ED 10/19/2013 with ongoing left second toe infection for past 3 months prior to this admission. Patient was on Augmentin given to him by podiatrist and he has completed the treatment with it 10/10/2013.  -admitted with cellulitis, osteomyelitis    Hospital Course:  1. Acute osteomyelitis of the left second toe / Cellulitis / Leukocytosis  -10/2: s/p 2nd toe amputation; per ortho follow up in 1 week  2. Bacteremia; +staph aureus; appreciate ID input;  -MRI spine: no osteomyelitis; TEE: unremarkable;  -PICC, started IV atx (2 weeks of IV ancef) last dose 10/17;   3. Hypertension; cont Norvasc 10 mg daily, resume lisinopril, hctz; change metoprolol 50 mg BID;  4. Hypercalcemia Likely due to possible dehydration, acute infection +bone destruction -resolving; .  5. Thrombocytopenia; Mild, platelet count 130, 124.; no signs of bleed.     Procedures:  Toe amputation L (i.e. Studies not automatically included, echos, thoracentesis, etc; not x-rays)  Consultations:  Ortho, ID  Discharge Exam: Filed Vitals:   10/24/13 0950  BP: 150/84  Pulse: 68  Temp:   Resp: 14    General: alert Cardiovascular: s1,s2 rrr Respiratory: CTA BL  Discharge Instructions  Discharge Instructions   Diet -  low sodium heart healthy    Complete by:  As directed      Discharge instructions    Complete by:  As directed   F/u with Dr. Sharol Given in 1 week     Increase activity slowly    Complete by:  As directed             Medication List    STOP taking these medications       amoxicillin-clavulanate 500-125 MG per tablet  Commonly known as:  AUGMENTIN     meloxicam 7.5 MG tablet  Commonly known as:  MOBIC      TAKE these medications       amLODipine 10 MG tablet  Commonly known as:  NORVASC  Take 10 mg by mouth daily.     artificial tears ointment  Place 1 drop into both eyes as needed (dry eyes).     ceFAZolin 2-3 GM-% Solr  Commonly known as:  ANCEF  Inject 50 mLs (2 g total) into the vein every 8 (eight) hours.     clonazePAM 0.5 MG tablet  Commonly known as:  KLONOPIN  Take 1 tablet (0.5 mg total) by mouth 2 (two) times daily as needed for anxiety.     cyclobenzaprine 10 MG tablet  Commonly known as:  FLEXERIL  Take 10 mg by mouth 3 (three) times daily as needed for muscle spasms.     fexofenadine 180 MG tablet  Commonly known as:  ALLEGRA  Take 180 mg by mouth daily.  FISH OIL PO  Take 1,200 mg by mouth 2 (two) times daily.     fluticasone 50 MCG/ACT nasal spray  Commonly known as:  FLONASE  Place 1 spray into both nostrils daily.     gabapentin 600 MG tablet  Commonly known as:  NEURONTIN  Take 600 mg by mouth at bedtime as needed (pain).     hydrochlorothiazide 25 MG tablet  Commonly known as:  HYDRODIURIL  Take 25 mg by mouth daily.     LEXAPRO 20 MG tablet  Generic drug:  escitalopram  Take 30 mg by mouth daily.     lisinopril 40 MG tablet  Commonly known as:  PRINIVIL,ZESTRIL  Take 40 mg by mouth daily.     Melatonin 5 MG Tabs  Take 0.5 tablets by mouth at bedtime.     methylphenidate 20 MG tablet  Commonly known as:  RITALIN  Take 20 mg by mouth daily.     metoprolol 50 MG tablet  Commonly known as:  LOPRESSOR  Take 1 tablet (50 mg  total) by mouth 2 (two) times daily.     multivitamin with minerals tablet  Take 1 tablet by mouth daily.     oxycodone 5 MG capsule  Commonly known as:  OXY-IR  Take 2 capsules (10 mg total) by mouth every 4 (four) hours as needed for pain.     vitamin C 1000 MG tablet  Take 1,000 mg by mouth daily.       No Known Allergies     Follow-up Information   Follow up with DUDA,MARCUS V, MD In 1 week.   Specialty:  Orthopedic Surgery   Contact information:   [place] [address] [phone]       Follow up with ANDY,CAMILLE L, MD In 1 week.   Specialty:  Family Medicine   Contact information:   [place] [address] [phone]        The results of significant diagnostics from this hospitalization (including imaging, microbiology, ancillary and laboratory) are listed below for reference.    Significant Diagnostic Studies: Mr Lumbar Spine W Wo Contrast  10/23/2013   CLINICAL DATA:  Low back pain. Ongoing Staph infection, concern for discitis.  EXAM: MRI LUMBAR SPINE WITHOUT AND WITH CONTRAST  TECHNIQUE: Multiplanar and multiecho pulse sequences of the lumbar spine were obtained without and with intravenous contrast.  CONTRAST:  25mL MULTIHANCE GADOBENATE DIMEGLUMINE 529 MG/ML IV SOLN  COMPARISON:  None.  FINDINGS: Segmentation: Normal.  Alignment:  Normal.  Vertebrae: No areas concerning for osteomyelitis. Endplate reactive changes above or below L1-2, L2-3, L3-4, and L4-5 are consistent with Modic type 2 chronic degenerative change.  Conus medullaris: Normal.  Paraspinal tissues: No mass or hydronephrosis. No evidence for myositis.  Disc levels:  L1-L2: Moderate disc space narrowing. Mild annular bulging and facet arthropathy without impingement.  L2-L3: Moderate disc space narrowing. Central disc osteophyte complex with mild annular bulging. Advanced facet and ligamentum flavum hypertrophy. Mild to moderate central canal stenosis.  BILATERAL subarticular zone narrowing.  L3-L4: Mild disc space narrowing. Mild annular bulging with central disc osteophyte complex. Moderate facet ligamentum flavum hypertrophy. LEFT L4 laminotomy. Mild to moderate central canal stenosis. No definite subarticular zone or significant foraminal narrowing.  L4-L5: Severe disc space narrowing. LEFT hemilaminotomy defect. Central and rightward disc osteophyte complex. BILATERAL facet arthropathy. Mild to moderate central canal stenosis. Severe RIGHT-sided foraminal narrowing. Moderate RIGHT subarticular zone narrowing. RIGHT L5 and RIGHT L4 nerve  root impingement are likely.  L5-S1: Mild annular bulging. Moderate facet and ligamentum flavum hypertrophy. BILATERAL foraminal narrowing is greater on the LEFT. No subarticular zone narrowing or spinal stenosis. Incidental conjoined nerve root sleeve extends to the LEFT.  Post infusion imaging demonstrates no features worrisome for discitis or osteomyelitis.  IMPRESSION: Postsurgical change and Multilevel spondylosis as described. Multiple levels of disc space narrowing with potentially symptomatic neural impingement from L2 through S1.  No features worrisome for discitis or osteomyelitis.   Electronically Signed   By: Rolla Flatten M.D.   On: 10/23/2013 14:34   Dg Toe 2nd Left  10/19/2013   CLINICAL DATA:  Left second toe wound infection for 3 months.  EXAM: LEFT SECOND TOE  COMPARISON:  None.  FINDINGS: The distal phalanx of the second toe is completely destroyed by infection and there is extensive destructive changes involving the middle phalanx also. No definite findings for septic arthritis at the PIP joint. Diffuse and marked soft tissue swelling of the second digit is demonstrated. I do not see any definite air in the soft tissues.  IMPRESSION: Advanced and aggressive osteomyelitis involving the middle and distal phalanges of the second toe.   Electronically Signed   By: Kalman Jewels M.D.   On: 10/19/2013 18:14     Microbiology: Recent Results (from the past 240 hour(s))  CULTURE, BLOOD (ROUTINE X 2)     Status: None   Collection Time    10/19/13  5:47 PM      Result Value Ref Range Status   Specimen Description BLOOD RIGHT ANTECUBITAL   Final   Special Requests BOTTLES DRAWN AEROBIC AND ANAEROBIC 5ML   Final   Culture  Setup Time     Final   Value: 10/19/2013 20:20     Performed at [facility]   Culture     Final   Value: STAPHYLOCOCCUS AUREUS     Note: RIFAMPIN AND GENTAMICIN SHOULD NOT BE USED AS SINGLE DRUGS FOR TREATMENT OF STAPH INFECTIONS.     Note: Gram Stain Report Called to,Read Back By and Verified With: BRIANA RICHARDSON @ 1700 ON 100115 BY [facility]     Performed at [facility]   Report Status 10/22/2013 FINAL   Final   Organism ID, Bacteria STAPHYLOCOCCUS AUREUS   Final  CULTURE, BLOOD (ROUTINE X 2)     Status: None   Collection Time    10/19/13  5:56 PM      Result Value Ref Range Status   Specimen Description BLOOD LEFT ANTECUBITAL   Final   Special Requests BOTTLES DRAWN AEROBIC AND ANAEROBIC 5ML   Final   Culture  Setup Time     Final   Value: 10/19/2013 20:20     Performed at [facility]   Culture     Final   Value: STAPHYLOCOCCUS AUREUS     Note: SUSCEPTIBILITIES PERFORMED ON PREVIOUS CULTURE WITHIN THE LAST 5 DAYS.     Note: Gram Stain Report Called to,Read Back By and Verified With: ELENA PEREZ 10/22/13 @ 1239PM BY [facility]COE A.     Performed at [facility]   Report Status 10/23/2013 FINAL   Final  WOUND CULTURE     Status: None   Collection Time    10/19/13  6:01 PM      Result Value Ref Range Status   Specimen Description FOOT   Final   Special Requests Normal   Final   Gram Stain  Final   Value: ABUNDANT WBC PRESENT,BOTH PMN AND MONONUCLEAR     NO SQUAMOUS EPITHELIAL CELLS SEEN     RARE GRAM POSITIVE COCCI     IN PAIRS IN CLUSTERS     Performed at [facility]   Culture     Final   Value: MODERATE STAPHYLOCOCCUS  AUREUS     Note: RIFAMPIN AND GENTAMICIN SHOULD NOT BE USED AS SINGLE DRUGS FOR TREATMENT OF STAPH INFECTIONS.     Performed at [facility]   Report Status 10/22/2013 FINAL   Final   Organism ID, Bacteria STAPHYLOCOCCUS AUREUS   Final  SURGICAL PCR SCREEN     Status: Abnormal   Collection Time    10/21/13 10:10 AM      Result Value Ref Range Status   MRSA, PCR NEGATIVE  NEGATIVE Final   Staphylococcus aureus POSITIVE (*) NEGATIVE Final   Comment:            The Xpert SA Assay (FDA     approved for NASAL specimens     in patients over 40 years of age),     is one component of     a comprehensive surveillance     program.  Test performance has     been validated by [employer] for patients greater     than or equal to 4 year old.     It is not intended     to diagnose infection nor to     guide or monitor treatment.  CULTURE, BLOOD (ROUTINE X 2)     Status: None   Collection Time    10/22/13  2:40 PM      Result Value Ref Range Status   Specimen Description BLOOD RIGHT ARM   Final   Special Requests BOTTLES DRAWN AEROBIC AND ANAEROBIC 10 CC   Final   Culture  Setup Time     Final   Value: 10/22/2013 20:32     Performed at [facility]   Culture     Final   Value:        BLOOD CULTURE RECEIVED NO GROWTH TO DATE CULTURE WILL BE HELD FOR 5 DAYS BEFORE ISSUING A FINAL NEGATIVE REPORT     Performed at [facility]   Report Status PENDING   Incomplete     Labs: Basic Metabolic Panel:  Recent Labs Lab 10/19/13 1747 10/20/13 0505  NA 134* 140  K 4.0 3.9  CL 97 104  CO2 28 27  GLUCOSE 91 99  BUN 23 23  CREATININE 0.98 0.90  CALCIUM 11.0* 10.7*  MG  --  1.8  PHOS  --  2.0*   Liver Function Tests:  Recent Labs Lab 10/19/13 1747 10/20/13 0505  AST 67* 61*  ALT 89* 78*  ALKPHOS 65 62  BILITOT 1.0 0.8  PROT 7.9 7.1  ALBUMIN 3.2* 2.8*   No results found for this basename: LIPASE, AMYLASE,  in the last 168 hours No results found for  this basename: AMMONIA,  in the last 168 hours CBC:  Recent Labs Lab 10/19/13 1747 10/20/13 0505  WBC 12.7* 7.8  NEUTROABS 7.5  --   HGB 14.7 14.3  HCT 42.9 42.1  MCV 91.5 90.1  PLT 130* 124*   Cardiac Enzymes: No results found for this basename: CKTOTAL, CKMB, CKMBINDEX, TROPONINI,  in the last 168 hours BNP: BNP (last 3 results) No results found for this basename: PROBNP,  in  the last 8760 hours CBG: No results found for this basename: GLUCAP,  in the last 168 hours     Signed:  Kinnie Feil  [facility] 10/24/2013, 11:02 AM

## 2013-10-24 NOTE — Progress Notes (Signed)
Echocardiogram Echocardiogram Transesophageal has been performed.  Joelene Millin 10/24/2013, 9:56 AM

## 2013-10-24 NOTE — Progress Notes (Signed)
Patient ID: William Solis, male   DOB: 21-Mar-1950, 63 y.o.   MRN: [id number] Dressing clean and dry left foot no cellulitis in the leg. I will followup in the office in one week. Minimize weightbearing left lower extremity.

## 2013-10-24 NOTE — Progress Notes (Signed)
[facility] PROGRESS NOTE  William Solis NWG:[id number] DOB: 1950-09-09 DOA: 10/19/2013 PCP: Leamon Arnt, MD  Assessment/Plan: 63 y.o. male with PMH of HTN, Hep C, s/p spinal fusion, peripheral neuropathy, who presented to [facility] ED 10/19/2013 with ongoing left second toe infection for past 3 months prior to this admission. Patient was on Augmentin given to him by podiatrist and he has completed the treatment with it 10/10/2013. -admitted with cellulitis, osteomyelitis   1. Acute osteomyelitis of the left second toe / Cellulitis / Leukocytosis  -10/2: s/p 2nd toe amputation; per ortho follow up   2. Bacteremia; +staph aureus; appreciate ID input; pend spinal MRI;  -PICC, started IV atx (2 weeks of IV ancef) last dose 10/17;  -MRI spine: no osteomyelitis; TEE: unremarkable;   3. Hypertension; cont Norvasc 10 mg daily, hold lisinopril 40 mg periop; change metoprolol 50 mg BID;  4. Hypercalcemia Likely due to possible dehydration, acute infection +bone destruction+HCTZ  -resolving; .  5. Thrombocytopenia; Mild, platelet count 130, 124.; no signs of bleed.   D/c home [place], IV atx    DVT Prophylaxis start SQ lovenox   Code Status: full Family Communication:  D/w patient,  (indicate person spoken with, relationship, and if by phone, the number) Disposition Plan: home  Likely in 24-48 hrs; pend TEE   Consultants:  ortho  Procedures:  Pend surgery   Antibiotics:  vanc 9/30<<<<   (indicate start date, and stop date if known)  HPI/Subjective: alert  Objective: Filed Vitals:   10/24/13 0950  BP: 150/84  Pulse: 68  Temp:   Resp: 14    Intake/Output Summary (Last 24 hours) at 10/24/13 1056 Last data filed at 10/24/13 0830  Gross per 24 hour  Intake    690 ml  Output   1875 ml  Net  -1185 ml   Filed Weights   10/20/13 0519  Weight: 102.059 kg (225 lb)    Exam:   General:  alert  Cardiovascular: s1,s2 rrr  Respiratory: CTA BL  Abdomen: soft, nt,nd   Musculoskeletal: foot post op dressed      Data Reviewed: Basic Metabolic Panel:  Recent Labs Lab 10/19/13 1747 10/20/13 0505  NA 134* 140  K 4.0 3.9  CL 97 104  CO2 28 27  GLUCOSE 91 99  BUN 23 23  CREATININE 0.98 0.90  CALCIUM 11.0* 10.7*  MG  --  1.8  PHOS  --  2.0*   Liver Function Tests:  Recent Labs Lab 10/19/13 1747 10/20/13 0505  AST 67* 61*  ALT 89* 78*  ALKPHOS 65 62  BILITOT 1.0 0.8  PROT 7.9 7.1  ALBUMIN 3.2* 2.8*   No results found for this basename: LIPASE, AMYLASE,  in the last 168 hours No results found for this basename: AMMONIA,  in the last 168 hours CBC:  Recent Labs Lab 10/19/13 1747 10/20/13 0505  WBC 12.7* 7.8  NEUTROABS 7.5  --   HGB 14.7 14.3  HCT 42.9 42.1  MCV 91.5 90.1  PLT 130* 124*   Cardiac Enzymes: No results found for this basename: CKTOTAL, CKMB, CKMBINDEX, TROPONINI,  in the last 168 hours BNP (last 3 results) No results found for this basename: PROBNP,  in the last 8760 hours CBG: No results found for this basename: GLUCAP,  in the last 168 hours  Recent Results (from the past 240 hour(s))  CULTURE, BLOOD (ROUTINE X 2)     Status: None   Collection Time    10/19/13  5:47 PM  Result Value Ref Range Status   Specimen Description BLOOD RIGHT ANTECUBITAL   Final   Special Requests BOTTLES DRAWN AEROBIC AND ANAEROBIC 5ML   Final   Culture  Setup Time     Final   Value: 10/19/2013 20:20     Performed at [facility]   Culture     Final   Value: STAPHYLOCOCCUS AUREUS     Note: RIFAMPIN AND GENTAMICIN SHOULD NOT BE USED AS SINGLE DRUGS FOR TREATMENT OF STAPH INFECTIONS.     Note: Gram Stain Report Called to,Read Back By and Verified With: BRIANA RICHARDSON @ [id number] ON 100115 BY [facility]     Performed at [facility]   Report Status 10/22/2013 FINAL   Final   Organism ID, Bacteria STAPHYLOCOCCUS AUREUS   Final  CULTURE, BLOOD (ROUTINE X 2)     Status: None   Collection Time    10/19/13  5:56 PM       Result Value Ref Range Status   Specimen Description BLOOD LEFT ANTECUBITAL   Final   Special Requests BOTTLES DRAWN AEROBIC AND ANAEROBIC 5ML   Final   Culture  Setup Time     Final   Value: 10/19/2013 20:20     Performed at [facility]   Culture     Final   Value: STAPHYLOCOCCUS AUREUS     Note: SUSCEPTIBILITIES PERFORMED ON PREVIOUS CULTURE WITHIN THE LAST 5 DAYS.     Note: Gram Stain Report Called to,Read Back By and Verified With: ELENA PEREZ 10/22/13 @ 1239PM BY RUSCOE A.     Performed at [facility]   Report Status 10/23/2013 FINAL   Final  WOUND CULTURE     Status: None   Collection Time    10/19/13  6:01 PM      Result Value Ref Range Status   Specimen Description FOOT   Final   Special Requests Normal   Final   Gram Stain     Final   Value: ABUNDANT WBC PRESENT,BOTH PMN AND MONONUCLEAR     NO SQUAMOUS EPITHELIAL CELLS SEEN     RARE GRAM POSITIVE COCCI     IN PAIRS IN CLUSTERS     Performed at [facility]   Culture     Final   Value: MODERATE STAPHYLOCOCCUS AUREUS     Note: RIFAMPIN AND GENTAMICIN SHOULD NOT BE USED AS SINGLE DRUGS FOR TREATMENT OF STAPH INFECTIONS.     Performed at [facility]   Report Status 10/22/2013 FINAL   Final   Organism ID, Bacteria STAPHYLOCOCCUS AUREUS   Final  SURGICAL PCR SCREEN     Status: Abnormal   Collection Time    10/21/13 10:10 AM      Result Value Ref Range Status   MRSA, PCR NEGATIVE  NEGATIVE Final   Staphylococcus aureus POSITIVE (*) NEGATIVE Final   Comment:            The Xpert SA Assay (FDA     approved for NASAL specimens     in patients over 39 years of age),     is one component of     a comprehensive surveillance     program.  Test performance has     been validated by [employer] for patients greater     than or equal to 32 year old.     It is not intended     to diagnose infection nor to  guide or monitor treatment.  CULTURE, BLOOD (ROUTINE X 2)     Status: None    Collection Time    10/22/13  2:40 PM      Result Value Ref Range Status   Specimen Description BLOOD RIGHT ARM   Final   Special Requests BOTTLES DRAWN AEROBIC AND ANAEROBIC 10 CC   Final   Culture  Setup Time     Final   Value: 10/22/2013 20:32     Performed at [facility]   Culture     Final   Value:        BLOOD CULTURE RECEIVED NO GROWTH TO DATE CULTURE WILL BE HELD FOR 5 DAYS BEFORE ISSUING A FINAL NEGATIVE REPORT     Performed at [facility]   Report Status PENDING   Incomplete     Studies: Mr Lumbar Spine W Wo Contrast  10/23/2013   CLINICAL DATA:  Low back pain. Ongoing Staph infection, concern for discitis.  EXAM: MRI LUMBAR SPINE WITHOUT AND WITH CONTRAST  TECHNIQUE: Multiplanar and multiecho pulse sequences of the lumbar spine were obtained without and with intravenous contrast.  CONTRAST:  92mL MULTIHANCE GADOBENATE DIMEGLUMINE 529 MG/ML IV SOLN  COMPARISON:  None.  FINDINGS: Segmentation: Normal.  Alignment:  Normal.  Vertebrae: No areas concerning for osteomyelitis. Endplate reactive changes above or below L1-2, L2-3, L3-4, and L4-5 are consistent with Modic type 2 chronic degenerative change.  Conus medullaris: Normal.  Paraspinal tissues: No mass or hydronephrosis. No evidence for myositis.  Disc levels:  L1-L2: Moderate disc space narrowing. Mild annular bulging and facet arthropathy without impingement.  L2-L3: Moderate disc space narrowing. Central disc osteophyte complex with mild annular bulging. Advanced facet and ligamentum flavum hypertrophy. Mild to moderate central canal stenosis. BILATERAL subarticular zone narrowing.  L3-L4: Mild disc space narrowing. Mild annular bulging with central disc osteophyte complex. Moderate facet ligamentum flavum hypertrophy. LEFT L4 laminotomy. Mild to moderate central canal stenosis. No definite subarticular zone or significant foraminal narrowing.  L4-L5: Severe disc space narrowing. LEFT hemilaminotomy defect. Central and  rightward disc osteophyte complex. BILATERAL facet arthropathy. Mild to moderate central canal stenosis. Severe RIGHT-sided foraminal narrowing. Moderate RIGHT subarticular zone narrowing. RIGHT L5 and RIGHT L4 nerve root impingement are likely.  L5-S1: Mild annular bulging. Moderate facet and ligamentum flavum hypertrophy. BILATERAL foraminal narrowing is greater on the LEFT. No subarticular zone narrowing or spinal stenosis. Incidental conjoined nerve root sleeve extends to the LEFT.  Post infusion imaging demonstrates no features worrisome for discitis or osteomyelitis.  IMPRESSION: Postsurgical change and Multilevel spondylosis as described. Multiple levels of disc space narrowing with potentially symptomatic neural impingement from L2 through S1.  No features worrisome for discitis or osteomyelitis.   Electronically Signed   By: Rolla Flatten M.D.   On: 10/23/2013 14:34    Scheduled Meds: . amLODipine  10 mg Oral Daily  .  ceFAZolin (ANCEF) IV  2 g Intravenous 3 times per day  . docusate sodium  100 mg Oral BID  . docusate sodium  100 mg Oral BID  . enoxaparin (LOVENOX) injection  40 mg Subcutaneous Q24H  . escitalopram  30 mg Oral Daily  . lisinopril  20 mg Oral Daily  . methylphenidate  20 mg Oral QAC breakfast  . metoprolol  50 mg Oral BID   Continuous Infusions: . sodium chloride 10 mL/hr at 10/23/13 1829    Active Problems:   Cellulitis and abscess of toe   Osteomyelitis of toe of  left foot   Osteomyelitis    Time spent: >35 minutes     Kinnie Feil  [facility] Pager [phone]. If 7PM-7AM, please contact night-coverage at [email or web address], password [facility] 10/24/2013, 10:56 AM  LOS: 5 days

## 2013-10-24 NOTE — Op Note (Signed)
No obvious vegetations. TV normal No TR AV normal  No AI MV normal MIld MR PV normal  NO PI LVEF normal NOrmal thoracic aorta.  Full report to follow

## 2013-10-24 NOTE — Interval H&P Note (Signed)
History and Physical Interval Note:  10/24/2013 8:31 AM  William Solis  has presented today for surgery, with the diagnosis of endocarditis  The various methods of treatment have been discussed with the patient and family. After consideration of risks, benefits and other options for treatment, the patient has consented to  Procedure(s): TRANSESOPHAGEAL ECHOCARDIOGRAM (TEE) (N/A) as a surgical intervention .  The patient's history has been reviewed, patient examined, no change in status, stable for surgery.  I have reviewed the patient's chart and labs.  Questions were answered to the patient's satisfaction.     Dorris Carnes

## 2013-10-24 NOTE — Care Management Note (Signed)
CARE MANAGEMENT NOTE 10/24/2013  Patient:  William Solis, William Solis   Account Number:  [id number]  Date Initiated:  10/22/2013  Documentation initiated by:  Ricki Miller  Subjective/Objective Assessment:   63 yr old male admitted with osteomyelitis of left second toe. 10/21/13 patient underwent surgery for amputation of left second toe.     Action/Plan:   Case manager spoke with patient and wife concerning home health and DME needs at discharge. Choice offered. They want [facility].  CM will fax order after PT sees patient.   Anticipated DC Date:  10/24/2013   Anticipated DC Plan:  [place]  CM consult      PAC Choice  [place]   Choice offered to / List presented to:  C-1 Patient   DME arranged  [place]  3-N-1  [place]      DME agency  [place] arranged  HH-1 RN      [place].   Status of service:  Completed, signed off Medicare Important Message given?   (If response is "NO", the following Medicare IM given date fields will be blank) Date Medicare IM given:   Medicare IM given by:   Date Additional Medicare IM given:   Additional Medicare IM given by:    Discharge Disposition:  [place]  Per UR Regulation:  Reviewed for med. necessity/level of care/duration of stay  If discussed at [place] of Stay Meetings, dates discussed:    Comments:  10/24/13 10:10am Ricki Miller, RN BSN Case Manager Referral called to [place], [facility] Liaison.

## 2013-10-24 NOTE — Discharge Instructions (Signed)
Minimize weightbearing left foot. Keep foot elevated level with the heart. Keep dressing clean dry and intact. Followup in one week.

## 2013-10-24 NOTE — Progress Notes (Signed)
Physical Therapy Treatment Patient Details Name: William Solis MRN: [id number] DOB: 1950/02/20 Today's Date: 10/24/2013    History of Present Illness 63 y.o. male with PMH of HTN, Hep C, s/p spinal fusion, peripheral neuropathy, who presented to [facility] ED 10/19/2013 with ongoing left second toe infection for past 3 months prior to this admission. Is now s/p left second ray amputation.    PT Comments    Pt ready for d/c home from PT standpoint. Recommend follow up HHPT at this time to reinforce mobility and stair training after d/c. Overall pt performing at S level with RW and intermittent cues for maintaining WB status with mobility. Decreased endurance limits patient's mobility.   Follow Up Recommendations  Supervision for mobility/OOB;Home health PT     Equipment Recommendations  Rolling walker with 5" wheels    Recommendations for Other Services       Precautions / Restrictions Precautions Precautions: Fall Required Braces or Orthoses: Other Brace/Splint (post op shoe LLE) Other Brace/Splint: post op shoe Restrictions Weight Bearing Restrictions: Yes LLE Weight Bearing: Touchdown weight bearing    Mobility  Bed Mobility                  Transfers Overall transfer level: Needs assistance Equipment used: Rolling walker (2 wheeled) Transfers: Sit to/from Stand Sit to Stand: Supervision;Modified independent (Device/Increase time)         General transfer comment: supervision for safety and cues for TDWB with gait and transfers.  Ambulation/Gait Ambulation/Gait assistance: Modified independent (Device/Increase time);Supervision Ambulation Distance (Feet): 100 Feet (x2) Assistive device: Rolling walker (2 wheeled) Gait Pattern/deviations: Step-to pattern;Antalgic     General Gait Details: verbal cues for maintaining WB status. No LOB occurred and limited due to endurance.   Stairs Stairs: Yes Stairs assistance: Min guard Stair Management: Backwards;With  walker;Step to pattern Number of Stairs: 2 General stair comments: min A provided to stabilize RW when ascending and descending. Cues for which foot to lead up/down with. Pt reports going through it once again was helpful and feels confident to be abe to do with family upon d.c  Wheelchair Mobility    Modified Rankin (Stroke Patients Only)       Balance           Standing balance support: Bilateral upper extremity supported Standing balance-Leahy Scale: Poor                      Cognition Arousal/Alertness: Awake/alert Behavior During Therapy: WFL for tasks assessed/performed Overall Cognitive Status: Within Functional Limits for tasks assessed       Memory: Decreased short-term memory (written material and reiteration of cues for stair negotiati)              Exercises      General Comments        Pertinent Vitals/Pain Pain Assessment: No/denies pain Pain Intervention(s): Premedicated before session    Home Living                      Prior Function            PT Goals (current goals can now be found in the care plan section) Acute Rehab PT Goals Patient Stated Goal: Go home Progress towards PT goals: Progressing toward goals    Frequency  Min 5X/week    PT Plan Current plan remains appropriate    Co-evaluation             End of  Session Equipment Utilized During Treatment: Gait belt;Other (comment) (post op shoe) Activity Tolerance: Patient tolerated treatment well Patient left: in chair;with call bell/phone within reach     Time: 1510-1545 PT Time Calculation (min): 35 min  Charges:  $Gait Training: 8-22 mins $Therapeutic Activity: 8-22 mins                    G Codes:      Allayne Gitelman 10/24/2013, 4:16 PM

## 2013-10-24 NOTE — Progress Notes (Signed)
PT Cancellation Note  Patient Details Name: William Solis MRN: [id number] DOB: 12/07/1950   Cancelled Treatment:    Reason Eval/Treat Not Completed: Other (comment) (Pt about to be taken for procedure) Attempted to see pt this AM for treatment but pt about to go off unit for procedure. Will follow up as able.   Canary Brim [facility] 10/24/2013, 8:11 AM

## 2013-10-24 NOTE — H&P (View-Only) (Signed)
    [facility] has been requested to perform a transesophageal echocardiogram on William Solis to r/o edndocarditis.  After careful review of history and examination, the risks and benefits of transesophageal echocardiogram have been explained including risks of esophageal damage, perforation (1:10,000 risk), bleeding, pharyngeal hematoma as well as other potential complications associated with conscious sedation including aspiration, arrhythmia, respiratory failure and death. All questions were answered. Patient is willing to proceed.   Erlene Quan, PA 10/23/2013 3:02 PM   Agree with above. Candee Furbish, MD

## 2013-10-25 ENCOUNTER — Encounter (HOSPITAL_COMMUNITY): Payer: Self-pay | Admitting: Internal Medicine

## 2013-10-25 DIAGNOSIS — Z8739 Personal history of other diseases of the musculoskeletal system and connective tissue: Secondary | ICD-10-CM | POA: Insufficient documentation

## 2013-10-28 LAB — CULTURE, BLOOD (ROUTINE X 2): CULTURE: NO GROWTH

## 2013-11-08 ENCOUNTER — Telehealth: Payer: Self-pay | Admitting: Licensed Clinical Social Worker

## 2013-11-08 NOTE — Telephone Encounter (Signed)
Patient's IV antibiotics ended on 11/05/13 and his PICC line was still in, [facility] needed an order to pull the PICC. Per Dr. Linus Salmons ok to d/c, orders given to Coretta at [facility].

## 2013-11-15 ENCOUNTER — Emergency Department (HOSPITAL_COMMUNITY)
Admission: EM | Admit: 2013-11-15 | Discharge: 2013-11-15 | Disposition: A | Discharge status: Home / Self Care | Payer: BC Managed Care – PPO | Attending: Emergency Medicine | Admitting: Emergency Medicine

## 2013-11-15 ENCOUNTER — Encounter (HOSPITAL_COMMUNITY): Payer: Self-pay | Admitting: Emergency Medicine

## 2013-11-15 DIAGNOSIS — Z87891 Personal history of nicotine dependence: Secondary | ICD-10-CM | POA: Insufficient documentation

## 2013-11-15 DIAGNOSIS — Z79899 Other long term (current) drug therapy: Secondary | ICD-10-CM | POA: Insufficient documentation

## 2013-11-15 DIAGNOSIS — I10 Essential (primary) hypertension: Secondary | ICD-10-CM | POA: Insufficient documentation

## 2013-11-15 DIAGNOSIS — Z792 Long term (current) use of antibiotics: Secondary | ICD-10-CM | POA: Insufficient documentation

## 2013-11-15 DIAGNOSIS — F900 Attention-deficit hyperactivity disorder, predominantly inattentive type: Secondary | ICD-10-CM | POA: Insufficient documentation

## 2013-11-15 DIAGNOSIS — Z8619 Personal history of other infectious and parasitic diseases: Secondary | ICD-10-CM | POA: Diagnosis not present

## 2013-11-15 LAB — CBC WITH DIFFERENTIAL/PLATELET
BASOS ABS: 0.1 10*3/uL (ref 0.0–0.1)
Basophils Relative: 1 % (ref 0–1)
EOS ABS: 0.3 10*3/uL (ref 0.0–0.7)
EOS PCT: 3 % (ref 0–5)
HCT: 45.3 % (ref 39.0–52.0)
Hemoglobin: 15.6 g/dL (ref 13.0–17.0)
Lymphocytes Relative: 30 % (ref 12–46)
Lymphs Abs: 2.4 10*3/uL (ref 0.7–4.0)
MCH: 30.6 pg (ref 26.0–34.0)
MCHC: 34.4 g/dL (ref 30.0–36.0)
MCV: 89 fL (ref 78.0–100.0)
MONO ABS: 0.8 10*3/uL (ref 0.1–1.0)
Monocytes Relative: 11 % (ref 3–12)
Neutro Abs: 4.4 10*3/uL (ref 1.7–7.7)
Neutrophils Relative %: 55 % (ref 43–77)
PLATELETS: 232 10*3/uL (ref 150–400)
RBC: 5.09 MIL/uL (ref 4.22–5.81)
RDW: 12.8 % (ref 11.5–15.5)
WBC: 7.9 10*3/uL (ref 4.0–10.5)

## 2013-11-15 LAB — COMPREHENSIVE METABOLIC PANEL
ALT: 40 U/L (ref 0–53)
AST: 51 U/L — ABNORMAL HIGH (ref 0–37)
Albumin: 3.3 g/dL — ABNORMAL LOW (ref 3.5–5.2)
Alkaline Phosphatase: 69 U/L (ref 39–117)
Anion gap: 11 (ref 5–15)
BUN: 21 mg/dL (ref 6–23)
CALCIUM: 13.3 mg/dL — AB (ref 8.4–10.5)
CO2: 26 meq/L (ref 19–32)
CREATININE: 1.05 mg/dL (ref 0.50–1.35)
Chloride: 100 mEq/L (ref 96–112)
GFR calc Af Amer: 87 mL/min — ABNORMAL LOW (ref >90)
GFR, EST NON AFRICAN AMERICAN: 75 mL/min — AB (ref >90)
Glucose, Bld: 93 mg/dL (ref 70–99)
Potassium: 4.2 mEq/L (ref 3.7–5.3)
SODIUM: 137 meq/L (ref 137–147)
TOTAL PROTEIN: 7.7 g/dL (ref 6.0–8.3)
Total Bilirubin: 0.4 mg/dL (ref 0.3–1.2)

## 2013-11-15 MED ORDER — SODIUM CHLORIDE 0.9 % IV BOLUS (SEPSIS)
1000.0000 mL | Freq: Once | INTRAVENOUS | Status: AC
  Administered 2013-11-15: 1000 mL via INTRAVENOUS

## 2013-11-15 NOTE — ED Provider Notes (Signed)
CSN: [id number]     Arrival date & time 11/15/13  1553 History   First MD Initiated Contact with Patient 11/15/13 1705     Chief Complaint  Patient presents with  . Abnormal Lab    HPI Pt presents to the ED for evaluation of hypercalcemia.  Pt had outpatient labs in anticipation of treatment for hepatitis C.  He was called today and was told that his calcium was 13.6   Pt denies any symptoms.  No vomiting or diarrhea.  Normal urination.  No trouble with weakness.  No trouble with recent confusion although he says he has had trouble off an on for years. Past Medical History  Diagnosis Date  . Hypertension   . Hepatitis C   . Neuropathy involving both lower extremities   . ADHD, adult residual type    Past Surgical History  Procedure Laterality Date  . Back surgery  2006    Disc  . Sinus exploration  2005/2007  . Amputation Left 10/21/2013    Procedure: LEFT FOOT SECOND RAY AMPUTATION ;  Surgeon: [name], MD;  Location: [place];  Service: Orthopedics;  Laterality: Left;  . Tee without cardioversion N/A 10/24/2013    Procedure: TRANSESOPHAGEAL ECHOCARDIOGRAM (TEE);  Surgeon: Fay Records, MD;  [facility] ENDOSCOPY;  Service: Cardiovascular;  Laterality: N/A;   Family History  Problem Relation Age of Onset  . Hypertension Mother   . Hypertension Father   . Diabetes type II Neg Hx    History  Substance Use Topics  . Smoking status: Former Smoker    Types: Cigarettes, Cigars    Quit date: 10/20/1990  . Smokeless tobacco: Never Used  . Alcohol Use: No    Review of Systems  All other systems reviewed and are negative.     Allergies  Review of patient's allergies indicates no known allergies.  Home Medications   Prior to Admission medications   Medication Sig Start Date End Date Taking? Authorizing Provider  albuterol (PROVENTIL HFA;VENTOLIN HFA) 108 (90 BASE) MCG/ACT inhaler Inhale 2 puffs into the lungs every 4 (four) hours as needed for wheezing or shortness of  breath.   Yes Historical Provider, MD  amLODipine (NORVASC) 10 MG tablet Take 10 mg by mouth daily.   Yes Historical Provider, MD  Ascorbic Acid (VITAMIN C) 1000 MG tablet Take 1,000 mg by mouth daily.   Yes Historical Provider, MD  benzonatate (TESSALON) 200 MG capsule Take 200 mg by mouth 3 (three) times daily as needed for cough.   Yes Historical Provider, MD  bisacodyl (DULCOLAX) 5 MG EC tablet Take 5 mg by mouth daily as needed for mild constipation or moderate constipation.   Yes Historical Provider, MD  clonazePAM (KLONOPIN) 0.5 MG tablet Take 0.5 mg by mouth 2 (two) times daily as needed for anxiety. 10/24/13  Yes Kinnie Feil, MD  cyclobenzaprine (FLEXERIL) 10 MG tablet Take 5 mg by mouth 3 (three) times daily as needed for muscle spasms.    Yes Historical Provider, MD  doxycycline (VIBRA-TABS) 100 MG tablet Take 100 mg by mouth 2 (two) times daily. Patient has 2 pills left as of 11-15-13   Yes Historical Provider, MD  escitalopram (LEXAPRO) 20 MG tablet Take 30 mg by mouth daily.   Yes Historical Provider, MD  fluticasone (FLONASE) 50 MCG/ACT nasal spray Place 1 spray into both nostrils daily as needed for rhinitis.    Yes Historical Provider, MD  gabapentin (NEURONTIN) 600 MG tablet Take 600 mg  by mouth at bedtime as needed (pain).    Yes Historical Provider, MD  lisinopril (PRINIVIL,ZESTRIL) 40 MG tablet Take 40 mg by mouth daily.   Yes Historical Provider, MD  Melatonin 5 MG TABS Take 0.5 tablets by mouth at bedtime.    Yes Historical Provider, MD  meloxicam (MOBIC) 7.5 MG tablet Take 7.5 mg by mouth daily as needed. For pain in thumbs   Yes Historical Provider, MD  methylphenidate (RITALIN) 20 MG tablet Take 20 mg by mouth daily.   Yes Historical Provider, MD  metoprolol (LOPRESSOR) 50 MG tablet Take 12.5 mg by mouth 2 (two) times daily. 10/24/13  Yes Kinnie Feil, MD  Multiple Vitamins-Minerals (MULTIVITAMIN WITH MINERALS) tablet Take 1 tablet by mouth daily.   Yes Historical  Provider, MD  mupirocin ointment (BACTROBAN) 2 % Place 1 application into the nose daily. For affected toe per patient   Yes Historical Provider, MD  Omega-3 Fatty Acids (FISH OIL PO) Take 1,000 mg by mouth 2 (two) times daily.    Yes Historical Provider, MD  OVER THE COUNTER MEDICATION Take 1 tablet by mouth daily as needed. Chest congestion relief. Guaifenesin 400mg    Yes Historical Provider, MD  oxycodone (OXY-IR) 5 MG capsule Take 2 capsules (10 mg total) by mouth every 4 (four) hours as needed for pain. 10/24/13  Yes Kinnie Feil, MD   BP 136/80  Pulse 69  Temp(Src) 97.8 F (36.6 C)  Resp 16  Ht 6' (1.829 m)  Wt 220 lb (99.791 kg)  BMI 29.83 kg/m2  SpO2 94% Physical Exam  Nursing note and vitals reviewed. Constitutional: He appears well-developed and well-nourished. No distress.  HENT:  Head: Normocephalic and atraumatic.  Right Ear: External ear normal.  Left Ear: External ear normal.  Eyes: Conjunctivae are normal. Right eye exhibits no discharge. Left eye exhibits no discharge. No scleral icterus.  Neck: Neck supple. No tracheal deviation present.  Cardiovascular: Normal rate, regular rhythm and intact distal pulses.   Pulmonary/Chest: Effort normal and breath sounds normal. No stridor. No respiratory distress. He has no wheezes. He has no rales.  Abdominal: Soft. Bowel sounds are normal. He exhibits no distension. There is no tenderness. There is no rebound and no guarding.  Musculoskeletal: He exhibits no edema and no tenderness.  Neurological: He is alert. He has normal strength. No cranial nerve deficit (no facial droop, extraocular movements intact, no slurred speech) or sensory deficit. He exhibits normal muscle tone. He displays no seizure activity. Coordination normal.  Skin: Skin is warm and dry. No rash noted.  Psychiatric: He has a normal mood and affect.    ED Course  Procedures (including critical care time) Labs Review Labs Reviewed  COMPREHENSIVE  METABOLIC PANEL - Abnormal; Notable for the following:    Calcium 13.3 (*)    Albumin 3.3 (*)    AST 51 (*)    GFR calc non Af Amer 75 (*)    GFR calc Af Amer 87 (*)    All other components within normal limits  CBC WITH DIFFERENTIAL    Pt was treated with IV fluids in the ED  MDM   Final diagnoses:  Hypercalcemia    The patient has moderate hypercalcemia. He is asymptomatic. Patient has normal renal function. He has no confusion.  Patient will need close outpatient follow-up however he does not require acute hospitalization at this point. I have encouraged him to continue oral fluids. I will have him discontinue his vitamin supplements and his  hydrochlorothiazide.  Patient states he has not been taking the hydrochlorothiazide recently.    Dorie Rank, MD 11/15/13 [phone]

## 2013-11-15 NOTE — Discharge Instructions (Signed)
Hypercalcemia Hypercalcemia means the calcium in your blood is too high. Calcium in our blood is important for the control of many things, such as:  Blood clotting.  Conducting of nerve impulses.  Muscle contraction.  Maintaining teeth and bone health.  Other body functions. In the bloodstream, calcium maintains a constant balance with another mineral, phosphate. Calcium is absorbed into the body through the small intestine. This is helped by vitamin D. Calcium levels are maintained mostly by vitamin D and a hormone (parathyroid hormone). But the kidneys also help. Hypercalcemia can happen when the concentration of calcium is too high for the kidneys to maintain balance. The body maintains a balance between the calcium we eat and the calcium already in our body. If calcium intake is increased or we cannot use calcium properly, there may be problems. Some common sources of calcium are:   Dairy products.  Nuts.  Eggs.  Whole grains.  Legumes.  Green leafy vegetables. CAUSES There are many causes of this condition, but some common ones are:  Hyperparathyroidism. This is an overactivity of the parathyroid gland.  Cancers of the breast, kidney, lung, head, and neck are common causes of calcium increases.  Medications that cause you to urinate more often (diuretics), nausea, vomiting, and diarrhea also increase the calcium in the blood.  Overuse of calcium-containing antacids. SYMPTOMS  Many patients with mild hypercalcemia have no symptoms. For those with symptoms, common problems include:  Loss of appetite.  Constipation.  Increased thirst.  Heart rhythm changes.  Abnormal thinking.  Nausea.  Abdominal pain.  Kidney stones.  Mood swings.  Coma and death when severe.  Vomiting.  Increased urination.  High blood pressure.  Confusion. DIAGNOSIS   Your caregiver will do a medical history and perform a physical exam on you.  Calcium and parathyroid hormone  (PTH) may be measured with a blood test. TREATMENT   The treatment depends on the calcium level and what is causing the higher level. Hypercalcemia can be life threatening. Fast lowering of the calcium level may be necessary.  With normal kidney function, fluids can be given by vein to clear the excess calcium. Hemodialysis works well to reduce dangerous calcium levels if there is poor kidney function. This is a procedure in which a machine is used to filter out unwanted substances. The blood is then returned to the body.  Drugs, such as diuretics, can be given after adequate fluid intake is established. These medications help the kidneys get rid of extra calcium. Drugs that lessen (inhibit) bone loss are helpful in gaining long-term control. Phosphate pills help lower high calcium levels caused by a low supply of phosphate. Anti-inflammatory agents such as steroids are helpful with some cancers and toxic levels of vitamin D.  Treatment of the underlying cause of the hypercalcemia will also correct the imbalance. Hyperparathyroidism is usually treated by surgical removal of one or more of the parathyroid glands and any tissue, other than the glands themselves, that is producing too much hormone.  The hypercalcemia caused by cancer is difficult to treat without controlling the cancer. Symptoms can be improved with fluids and drug therapy as outlined above. PROGNOSIS   Surgery to remove the parathyroid glands is usually successful. This also depends on the amount of damage to the kidneys and whether or not it can be treated.  Mild hypercalcemia can be controlled with good fluid intake and the use of effective medications.  Hypercalcemia often develops as a late complication of cancer. The expected outlook  is poor without effective anticancer therapy. PREVENTION   If you are at risk for developing hypercalcemia, be familiar with early symptoms. Report these to your caregiver.  Good fluid intake  (up to four quarts of liquid a day if possible) is helpful.  Try to control nausea and vomiting, and treat fevers to avoid dehydration.  Lowering the amount of calcium in your diet is not necessary. High blood calcium reduces absorption of calcium in the intestine.  Stay as active as possible. SEEK IMMEDIATE MEDICAL CARE IF:   You develop chest pain, sweating, or shortness of breath.  You get confused, feel faint or pass out.  You develop severe nausea and vomiting. MAKE SURE YOU:   Understand these instructions.  Will watch your condition.  Will get help right away if you are not doing well or get worse. Document Released: 03/22/2004 Document Revised: 05/23/2013 Document Reviewed: 01/01/2010 [facility] Patient Information 2015 [place], [place]. This information is not intended to replace advice given to you by your health care provider. Make sure you discuss any questions you have with your health care provider.

## 2013-11-15 NOTE — ED Notes (Signed)
Patient  Sent to ED by his MD with C/O an elevated calcium(13.6).  Pateint reports intermittent confusion and fatigue.  States that he has had bronchitis recently and is still recovering.  Denies nausea, vomiting, diarrhea.

## 2013-11-15 NOTE — ED Notes (Signed)
Pt sent here for Calcium 13.6. Pt states he had routine blood draw and was notified today of critical result. Pt denies any complaint. States he is currently being tx for bronchitis. Pt in NAD. AO x 4.

## 2013-11-22 DIAGNOSIS — Z8639 Personal history of other endocrine, nutritional and metabolic disease: Secondary | ICD-10-CM | POA: Insufficient documentation

## 2013-11-28 ENCOUNTER — Other Ambulatory Visit (INDEPENDENT_AMBULATORY_CARE_PROVIDER_SITE_OTHER): Payer: Self-pay

## 2013-11-30 ENCOUNTER — Encounter (HOSPITAL_COMMUNITY)
Admission: RE | Admit: 2013-11-30 | Discharge: 2013-11-30 | Disposition: A | Discharge status: Home / Self Care | Payer: BC Managed Care – PPO | Source: Ambulatory Visit | Attending: General Surgery | Admitting: General Surgery

## 2013-11-30 MED ORDER — TECHNETIUM TC 99M SESTAMIBI GENERIC - CARDIOLITE
25.0000 | Freq: Once | INTRAVENOUS | Status: AC | PRN
  Administered 2013-11-30: 25 via INTRAVENOUS

## 2013-12-05 ENCOUNTER — Encounter (HOSPITAL_COMMUNITY): Payer: BC Managed Care – PPO

## 2013-12-05 ENCOUNTER — Ambulatory Visit (INDEPENDENT_AMBULATORY_CARE_PROVIDER_SITE_OTHER): Payer: Self-pay | Admitting: General Surgery

## 2013-12-05 NOTE — H&P (Signed)
History of Present Illness Ralene Ok MD; 12/05/2013 4:13 PM) Patient words: discuss Sestamibi Scan.  The patient is a 63 year old male who presents with a complaint of hyperparathyroidism. Patient is a 63 year old male who is referred by Dr. Jonni Sanger for evaluation of hypercalcemia.  Patient's sestamibi scan is localized to the left inferior area.  Patient states that symptoms have not changed   Patient was recently undergoing blood tests for hepatitis C workup. At this time patient was seen have hypercalcemia and was sent to the ER. Laboratory studies reveal calcium level of greater than 13. History shows calcium levels of 10.9 and 11.  Patient does have a history of 1-2 months of "foggy headedness " and fatigue. Patient recently was treated for osteomyelitis of his left second toe. Medication History Ivor Costa, [place]; 12/05/2013 3:42 PM) Benzonatate (200MG  Capsule, Oral) Active. Doxycycline Hyclate (100MG  Capsule, Oral) Active. ProAir HFA (108 (90 Base)MCG/ACT Aerosol Soln, Inhalation) Active.     Review of Systems Ralene Ok, MD; 12/05/2013 4:12 00) General Present- Appetite Loss, Fatigue, Night Sweats and Weight Loss. Not Present- Chills, Fever and Weight Gain. Skin Present- Dryness. Not Present- Change in Wart/Mole, Hives, Jaundice, New Lesions, Non-Healing Wounds, Rash and Ulcer. HEENT Present- Visual Disturbances and Wears glasses/contact lenses. Not Present- Earache, Hearing Loss, Hoarseness, Nose Bleed, Oral Ulcers, Ringing in the Ears, Seasonal Allergies, Sinus Pain, Sore Throat and Yellow Eyes. Respiratory Present- Snoring and Wheezing. Not Present- Bloody sputum, Chronic Cough and Difficulty Breathing. Breast Not Present- Breast Mass, Breast Pain, Nipple Discharge and Skin Changes. Cardiovascular Not Present- Chest Pain, Difficulty Breathing Lying Down, Leg Cramps, Palpitations, Rapid Heart Rate, Shortness of Breath and Swelling of  Extremities. Gastrointestinal Present- Constipation. Not Present- Abdominal Pain, Bloating, Bloody Stool, Change in Bowel Habits, Chronic diarrhea, Difficulty Swallowing, Excessive gas, Gets full quickly at meals, Hemorrhoids, Indigestion, Nausea, Rectal Pain and Vomiting. Male Genitourinary Present- Urgency. Not Present- Blood in Urine, Change in Urinary Stream, Frequency, Impotence, Nocturia, Painful Urination and Urine Leakage. Musculoskeletal Present- Back Pain, Joint Pain, Joint Stiffness and Swelling of Extremities. Not Present- Muscle Pain and Muscle Weakness. Neurological Present- Decreased Memory, Numbness and Trouble walking. Not Present- Fainting, Headaches, Seizures, Tingling, Tremor and Weakness. Psychiatric Present- Anxiety, Bipolar and Depression. Not Present- Change in Sleep Pattern, Fearful and Frequent crying. Endocrine Present- Cold Intolerance and Heat Intolerance. Not Present- Excessive Hunger, Hair Changes and New Diabetes. Hematology Present- Gland problems. Not Present- Easy Bruising, Excessive bleeding, HIV and Persistent Infections.  Vitals Ivor Costa MA; 12/05/2013 3:42 PM) 12/05/2013 3:41 PM Weight: 219.4 lb Temp.: 91F(Temporal)  Pulse: 71 (Regular)  BP: 126/84 (Sitting, Left Arm, Standard)     Physical Exam Ralene Ok, MD; 12/05/2013 4:12 00)  General Mental Status-Alert. General Appearance-Consistent with stated age. Hydration-Well hydrated. Voice-Normal.  Head and Neck Head-normocephalic, atraumatic with no lesions or palpable masses. Trachea-midline. Thyroid Gland Characteristics - normal size and consistency. Note:no massess palpated  Chest and Lung Exam Chest and lung exam reveals -quiet, even and easy respiratory effort with no use of accessory muscles and on auscultation, normal breath sounds, no adventitious sounds and normal vocal resonance. Inspection Chest Wall - Normal. Back -  normal.  Cardiovascular Cardiovascular examination reveals -normal heart sounds, regular rate and rhythm with no murmurs and normal pedal pulses bilaterally.  Abdomen Inspection Normal Exam - No Hernias. Skin - Scar - no surgical scars. Palpation/Percussion Normal exam - Soft, Non Tender, No Rebound tenderness, No Rigidity (guarding) and No hepatosplenomegaly. Auscultation Normal exam - Bowel sounds  normal.    Assessment & Plan Ralene Ok MD; 12/05/2013 4:15 PM)  HYPERPARATHYROIDISM, PRIMARY (252.01  E21.0) Impression: 63 year old male with hyperparathyroidism, primary  As for the sestamibi scan there is a left inferior adenoma  We will proceed to the operating room for a minimally invasive left parathyroidectomy  I discussed with the patient the risks and benefits the procedure to include but not limited to: Infection, bleeding, damages running structures, possible need for extended hospital stay, patient was understanding and wishes to proceed.

## 2013-12-16 ENCOUNTER — Inpatient Hospital Stay (HOSPITAL_COMMUNITY)
Admission: RE | Admit: 2013-12-16 | Discharge: 2013-12-16 | Disposition: A | Discharge status: Home / Self Care | Payer: BC Managed Care – PPO | Source: Ambulatory Visit

## 2013-12-16 ENCOUNTER — Encounter (HOSPITAL_COMMUNITY): Payer: Self-pay | Admitting: *Deleted

## 2013-12-16 ENCOUNTER — Encounter (HOSPITAL_COMMUNITY): Payer: Self-pay

## 2013-12-16 HISTORY — DX: Anxiety disorder, unspecified: F41.9

## 2013-12-16 HISTORY — DX: Insomnia, unspecified: G47.00

## 2013-12-16 HISTORY — DX: Other muscle spasm: M62.838

## 2013-12-16 HISTORY — DX: Depression, unspecified: F32.A

## 2013-12-16 HISTORY — DX: Major depressive disorder, single episode, unspecified: F32.9

## 2013-12-16 NOTE — Pre-Procedure Instructions (Signed)
William Solis  12/16/2013   Your procedure is scheduled on:  Wed, Dec 2 @ 8:30 AM  Report to [facility]  at 6:30 AM.  Call this number if you have problems the morning of surgery: [phone]   Remember:   Do not eat food or drink liquids after midnight.   Take these medicines the morning of surgery with A SIP OF WATER: Albuterol<Bring Your Inhaler With You>,Norvasc(Amlodipine),Tessalon(Benzonatate-if needed),Clonazepam(Klonopin),Lexaprol(Escitalopram),Flonase(Fluticasone),Gabapentin(Neurontin-if needed),Harvoni(Ledipasvir-Sofosbuvir),Metoprolol(Lopressor),Ritalin(Methylphenidate),and Pain Pill(if needed)                Stop taking your Fish Oil. No Goody's,BC's,Aleve,Aspirin,Ibuprofen,or any Herbal Medications.   Do not wear jewelry.  Do not wear lotions, powders, or colognes. You may wear deodorant.  Men may shave face and neck.  Do not bring valuables to the hospital.  [facility] is not responsible                  for any belongings or valuables.               Contacts, dentures or bridgework may not be worn into surgery.  Leave suitcase in the car. After surgery it may be brought to your room.  For patients admitted to the hospital, discharge time is determined by your                treatment team.               Patients discharged the day of surgery will not be allowed to drive  home.    Special Instructions:  [facility] - Preparing for Surgery  Before surgery, you can play an important role.  Because skin is not sterile, your skin needs to be as free of germs as possible.  You can reduce the number of germs on you skin by washing with CHG (chlorahexidine gluconate) soap before surgery.  CHG is an antiseptic cleaner which kills germs and bonds with the skin to continue killing germs even after washing.  Please DO NOT use if you have an allergy to CHG or antibacterial soaps.  If your skin becomes reddened/irritated stop using the CHG and inform your nurse when  you arrive at Short Stay.  Do not shave (including legs and underarms) for at least 48 hours prior to the first CHG shower.  You may shave your face.  Please follow these instructions carefully:   1.  Shower with CHG Soap the night before surgery and the                                morning of Surgery.  2.  If you choose to wash your hair, wash your hair first as usual with your       normal shampoo.  3.  After you shampoo, rinse your hair and body thoroughly to remove the                      Shampoo.  4.  Use CHG as you would any other liquid soap.  You can apply chg directly       to the skin and wash gently with scrungie or a clean washcloth.  5.  Apply the CHG Soap to your body ONLY FROM THE NECK DOWN.        Do not use on open wounds or open sores.  Avoid contact with your eyes,  ears, mouth and genitals (private parts).  Wash genitals (private parts)       with your normal soap.  6.  Wash thoroughly, paying special attention to the area where your surgery        will be performed.  7.  Thoroughly rinse your body with warm water from the neck down.  8.  DO NOT shower/wash with your normal soap after using and rinsing off       the CHG Soap.  9.  Pat yourself dry with a clean towel.            10.  Wear clean pajamas.            11.  Place clean sheets on your bed the night of your first shower and do not        sleep with pets.  Day of Surgery  Do not apply any lotions/deoderants the morning of surgery.  Please wear clean clothes to the hospital/surgery center.     Please read over the following fact sheets that you were given: Pain Booklet, Coughing and Deep Breathing and Surgical Site Infection Prevention

## 2013-12-16 NOTE — Progress Notes (Signed)
Sleep study done 2003 but doesn't use a CPAP now

## 2013-12-20 MED ORDER — VANCOMYCIN HCL IN DEXTROSE 1-5 GM/200ML-% IV SOLN
1000.0000 mg | INTRAVENOUS | Status: AC
  Administered 2013-12-21: 1000 mg via INTRAVENOUS
  Filled 2013-12-20: qty 200

## 2013-12-20 NOTE — H&P (Signed)
History of Present Illness Ralene Ok MD; 12/05/2013 4:13 PM) Patient words: discuss Sestamibi Scan.  The patient is a 63 year old male who presents with a complaint of hyperparathyroidism. Patient is a 63 year old male who is referred by Dr. Jonni Sanger for evaluation of hypercalcemia.  Patient's sestamibi scan is localized to the left inferior area.  Patient states that symptoms have not changed   Patient was recently undergoing blood tests for hepatitis C workup. At this time patient was seen have hypercalcemia and was sent to the ER. Laboratory studies reveal calcium level of greater than 13. History shows calcium levels of 10.9 and 11.  Patient does have a history of 1-2 months of "foggy headedness " and fatigue. Patient recently was treated for osteomyelitis of his left second toe.   Medication History Ivor Costa, [place]; 12/05/2013 3:42 PM) Benzonatate (200MG  Capsule, Oral) Active. Doxycycline Hyclate (100MG  Capsule, Oral) Active. ProAir HFA (108 (90 Base)MCG/ACT Aerosol Soln, Inhalation) Active.  Review of Systems Ralene Ok, MD; 12/05/2013 4:12 00) General Present- Appetite Loss, Fatigue, Night Sweats and Weight Loss. Not Present- Chills, Fever and Weight Gain. Skin Present- Dryness. Not Present- Change in Wart/Mole, Hives, Jaundice, New Lesions, Non-Healing Wounds, Rash and Ulcer. HEENT Present- Visual Disturbances and Wears glasses/contact lenses. Not Present- Earache, Hearing Loss, Hoarseness, Nose Bleed, Oral Ulcers, Ringing in the Ears, Seasonal Allergies, Sinus Pain, Sore Throat and Yellow Eyes. Respiratory Present- Snoring and Wheezing. Not Present- Bloody sputum, Chronic Cough and Difficulty Breathing. Breast Not Present- Breast Mass, Breast Pain, Nipple Discharge and Skin Changes. Cardiovascular Not Present- Chest Pain, Difficulty Breathing Lying Down, Leg Cramps, Palpitations, Rapid Heart Rate, Shortness of Breath and Swelling of  Extremities. Gastrointestinal Present- Constipation. Not Present- Abdominal Pain, Bloating, Bloody Stool, Change in Bowel Habits, Chronic diarrhea, Difficulty Swallowing, Excessive gas, Gets full quickly at meals, Hemorrhoids, Indigestion, Nausea, Rectal Pain and Vomiting. Male Genitourinary Present- Urgency. Not Present- Blood in Urine, Change in Urinary Stream, Frequency, Impotence, Nocturia, Painful Urination and Urine Leakage. Musculoskeletal Present- Back Pain, Joint Pain, Joint Stiffness and Swelling of Extremities. Not Present- Muscle Pain and Muscle Weakness. Neurological Present- Decreased Memory, Numbness and Trouble walking. Not Present- Fainting, Headaches, Seizures, Tingling, Tremor and Weakness. Psychiatric Present- Anxiety, Bipolar and Depression. Not Present- Change in Sleep Pattern, Fearful and Frequent crying. Endocrine Present- Cold Intolerance and Heat Intolerance. Not Present- Excessive Hunger, Hair Changes and New Diabetes. Hematology Present- Gland problems. Not Present- Easy Bruising, Excessive bleeding, HIV and Persistent Infections.   Vitals Ivor Costa MA; 12/05/2013 3:42 PM) 12/05/2013 3:41 PM Weight: 219.4 lb Temp.: 6F(Temporal)  Pulse: 71 (Regular)  BP: 126/84 (Sitting, Left Arm, Standard)    Physical Exam Ralene Ok, MD; 12/05/2013 4:12 00) General Mental Status-Alert. General Appearance-Consistent with stated age. Hydration-Well hydrated. Voice-Normal.  Head and Neck Head-normocephalic, atraumatic with no lesions or palpable masses. Trachea-midline. Thyroid Gland Characteristics - normal size and consistency. Note: no massess palpated  Chest and Lung Exam Chest and lung exam reveals -quiet, even and easy respiratory effort with no use of accessory muscles and on auscultation, normal breath sounds, no adventitious sounds and normal vocal resonance. Inspection Chest Wall - Normal. Back -  normal.  Cardiovascular Cardiovascular examination reveals -normal heart sounds, regular rate and rhythm with no murmurs and normal pedal pulses bilaterally.  Abdomen Inspection Normal Exam - No Hernias. Skin - Scar - no surgical scars. Palpation/Percussion Normal exam - Soft, Non Tender, No Rebound tenderness, No Rigidity (guarding) and No hepatosplenomegaly. Auscultation Normal exam - Bowel sounds normal.  Assessment & Plan Ralene Ok MD; 12/05/2013 4:15 PM) HYPERPARATHYROIDISM, PRIMARY (252.01  E21.0) Impression: 63 year old male with hyperparathyroidism, primary  As for the sestamibi scan there is a left inferior adenoma  We will proceed to the operating room for a minimally invasive left parathyroidectomy  I discussed with the patient the risks and benefits the procedure to include but not limited to: Infection, bleeding, damages running structures, possible need for extended hospital stay, patient was understanding and wishes to proceed.

## 2013-12-21 ENCOUNTER — Encounter (HOSPITAL_COMMUNITY)
Admission: RE | Disposition: A | Discharge status: Home / Self Care | Payer: Self-pay | Source: Ambulatory Visit | Attending: General Surgery

## 2013-12-21 ENCOUNTER — Ambulatory Visit (HOSPITAL_COMMUNITY): Payer: BC Managed Care – PPO | Admitting: Anesthesiology

## 2013-12-21 ENCOUNTER — Observation Stay (HOSPITAL_COMMUNITY)
Admission: RE | Admit: 2013-12-21 | Discharge: 2013-12-22 | Disposition: A | Discharge status: Home / Self Care | Payer: BC Managed Care – PPO | Source: Ambulatory Visit | Attending: General Surgery | Admitting: General Surgery

## 2013-12-21 ENCOUNTER — Encounter (HOSPITAL_COMMUNITY): Payer: Self-pay | Admitting: Anesthesiology

## 2013-12-21 DIAGNOSIS — E892 Postprocedural hypoparathyroidism: Secondary | ICD-10-CM

## 2013-12-21 DIAGNOSIS — I10 Essential (primary) hypertension: Secondary | ICD-10-CM | POA: Diagnosis not present

## 2013-12-21 DIAGNOSIS — E210 Primary hyperparathyroidism: Secondary | ICD-10-CM | POA: Diagnosis present

## 2013-12-21 DIAGNOSIS — R9431 Abnormal electrocardiogram [ECG] [EKG]: Secondary | ICD-10-CM | POA: Insufficient documentation

## 2013-12-21 DIAGNOSIS — B182 Chronic viral hepatitis C: Secondary | ICD-10-CM | POA: Insufficient documentation

## 2013-12-21 DIAGNOSIS — Z9889 Other specified postprocedural states: Secondary | ICD-10-CM

## 2013-12-21 DIAGNOSIS — Z9089 Acquired absence of other organs: Secondary | ICD-10-CM

## 2013-12-21 DIAGNOSIS — Z87891 Personal history of nicotine dependence: Secondary | ICD-10-CM | POA: Insufficient documentation

## 2013-12-21 HISTORY — PX: PARATHYROIDECTOMY: SHX19

## 2013-12-21 LAB — BASIC METABOLIC PANEL
ANION GAP: 13 (ref 5–15)
BUN: 18 mg/dL (ref 6–23)
CALCIUM: 10.9 mg/dL — AB (ref 8.4–10.5)
CO2: 24 meq/L (ref 19–32)
Chloride: 104 mEq/L (ref 96–112)
Creatinine, Ser: 1.06 mg/dL (ref 0.50–1.35)
GFR calc Af Amer: 86 mL/min — ABNORMAL LOW (ref >90)
GFR calc non Af Amer: 74 mL/min — ABNORMAL LOW (ref >90)
Glucose, Bld: 88 mg/dL (ref 70–99)
POTASSIUM: 3.9 meq/L (ref 3.7–5.3)
Sodium: 141 mEq/L (ref 137–147)

## 2013-12-21 LAB — CALCIUM: CALCIUM: 9.8 mg/dL (ref 8.4–10.5)

## 2013-12-21 LAB — CBC
HCT: 42.9 % (ref 39.0–52.0)
Hemoglobin: 14.8 g/dL (ref 13.0–17.0)
MCH: 30.8 pg (ref 26.0–34.0)
MCHC: 34.5 g/dL (ref 30.0–36.0)
MCV: 89.4 fL (ref 78.0–100.0)
PLATELETS: 170 10*3/uL (ref 150–400)
RBC: 4.8 MIL/uL (ref 4.22–5.81)
RDW: 12.8 % (ref 11.5–15.5)
WBC: 6.5 10*3/uL (ref 4.0–10.5)

## 2013-12-21 SURGERY — PARATHYROIDECTOMY
Anesthesia: General | Site: Neck

## 2013-12-21 MED ORDER — ESCITALOPRAM OXALATE 10 MG PO TABS
30.0000 mg | ORAL_TABLET | Freq: Every day | ORAL | Status: DC
  Administered 2013-12-22: 30 mg via ORAL
  Filled 2013-12-21: qty 1

## 2013-12-21 MED ORDER — LIDOCAINE HCL (CARDIAC) 20 MG/ML IV SOLN
INTRAVENOUS | Status: DC | PRN
  Administered 2013-12-21: 70 mg via INTRAVENOUS

## 2013-12-21 MED ORDER — LIDOCAINE HCL 4 % MT SOLN
OROMUCOSAL | Status: DC | PRN
  Administered 2013-12-21: 3 mL via TOPICAL

## 2013-12-21 MED ORDER — METOPROLOL TARTRATE 50 MG PO TABS
50.0000 mg | ORAL_TABLET | Freq: Two times a day (BID) | ORAL | Status: DC
Start: 2013-12-21 — End: 2013-12-22
  Administered 2013-12-21 – 2013-12-22 (×2): 50 mg via ORAL
  Filled 2013-12-21 (×3): qty 1

## 2013-12-21 MED ORDER — 0.9 % SODIUM CHLORIDE (POUR BTL) OPTIME
TOPICAL | Status: DC | PRN
  Administered 2013-12-21: 1000 mL

## 2013-12-21 MED ORDER — DEXTROSE-NACL 5-0.9 % IV SOLN
INTRAVENOUS | Status: DC
  Administered 2013-12-21 – 2013-12-22 (×2): via INTRAVENOUS

## 2013-12-21 MED ORDER — OXYCODONE HCL 5 MG PO TABS
5.0000 mg | ORAL_TABLET | ORAL | Status: DC | PRN
  Administered 2013-12-21 – 2013-12-22 (×3): 5 mg via ORAL
  Filled 2013-12-21 (×4): qty 1

## 2013-12-21 MED ORDER — LEDIPASVIR-SOFOSBUVIR 90-400 MG PO TABS
1.0000 | ORAL_TABLET | Freq: Every day | ORAL | Status: DC
  Administered 2013-12-21: 1 via ORAL

## 2013-12-21 MED ORDER — LIDOCAINE HCL (CARDIAC) 20 MG/ML IV SOLN
INTRAVENOUS | Status: AC
  Filled 2013-12-21: qty 5

## 2013-12-21 MED ORDER — HYDROMORPHONE HCL 1 MG/ML IJ SOLN
INTRAMUSCULAR | Status: AC
  Filled 2013-12-21: qty 1

## 2013-12-21 MED ORDER — PROPOFOL 10 MG/ML IV BOLUS
INTRAVENOUS | Status: DC | PRN
  Administered 2013-12-21: 200 mg via INTRAVENOUS

## 2013-12-21 MED ORDER — ONDANSETRON HCL 4 MG/2ML IJ SOLN
4.0000 mg | Freq: Four times a day (QID) | INTRAMUSCULAR | Status: DC | PRN
Start: 2013-12-21 — End: 2013-12-22

## 2013-12-21 MED ORDER — GABAPENTIN 600 MG PO TABS: 600.0000 mg | ORAL_TABLET | Freq: Every evening | ORAL | Status: DC | PRN

## 2013-12-21 MED ORDER — PROPOFOL 10 MG/ML IV BOLUS
INTRAVENOUS | Status: AC
  Filled 2013-12-21: qty 20

## 2013-12-21 MED ORDER — METHYLPHENIDATE HCL 5 MG PO TABS
20.0000 mg | ORAL_TABLET | Freq: Every day | ORAL | Status: DC
  Administered 2013-12-22: 20 mg via ORAL
  Filled 2013-12-21: qty 4

## 2013-12-21 MED ORDER — PHENOL 1.4 % MT LIQD: 1.0000 | OROMUCOSAL | Status: DC | PRN

## 2013-12-21 MED ORDER — HYDROMORPHONE HCL 1 MG/ML IJ SOLN
INTRAMUSCULAR | Status: AC
  Filled 2013-12-21: qty 2

## 2013-12-21 MED ORDER — MIDAZOLAM HCL 2 MG/2ML IJ SOLN
INTRAMUSCULAR | Status: AC
  Filled 2013-12-21: qty 2

## 2013-12-21 MED ORDER — BENZONATATE 100 MG PO CAPS: 200.0000 mg | ORAL_CAPSULE | Freq: Three times a day (TID) | ORAL | Status: DC | PRN

## 2013-12-21 MED ORDER — FLUTICASONE PROPIONATE 50 MCG/ACT NA SUSP: 1.0000 | Freq: Every day | NASAL | Status: DC | PRN

## 2013-12-21 MED ORDER — NEOSTIGMINE METHYLSULFATE 10 MG/10ML IV SOLN
INTRAVENOUS | Status: DC | PRN
  Administered 2013-12-21: 4 mg via INTRAVENOUS

## 2013-12-21 MED ORDER — EPHEDRINE SULFATE 50 MG/ML IJ SOLN
INTRAMUSCULAR | Status: AC
  Filled 2013-12-21: qty 1

## 2013-12-21 MED ORDER — MELATONIN 5 MG PO TABS: 0.5000 | ORAL_TABLET | Freq: Every day | ORAL | Status: DC

## 2013-12-21 MED ORDER — ONDANSETRON HCL 4 MG/2ML IJ SOLN
INTRAMUSCULAR | Status: DC | PRN
  Administered 2013-12-21: 4 mg via INTRAVENOUS

## 2013-12-21 MED ORDER — BUPIVACAINE HCL (PF) 0.25 % IJ SOLN
INTRAMUSCULAR | Status: AC
  Filled 2013-12-21: qty 30

## 2013-12-21 MED ORDER — CALCIUM CARBONATE-VITAMIN D 500-200 MG-UNIT PO TABS
2.0000 | ORAL_TABLET | Freq: Four times a day (QID) | ORAL | Status: DC
  Administered 2013-12-21 – 2013-12-22 (×4): 2 via ORAL
  Filled 2013-12-21 (×7): qty 2

## 2013-12-21 MED ORDER — BISACODYL 5 MG PO TBEC: 5.0000 mg | DELAYED_RELEASE_TABLET | Freq: Every day | ORAL | Status: DC | PRN

## 2013-12-21 MED ORDER — DOXYCYCLINE HYCLATE 100 MG PO TABS
100.0000 mg | ORAL_TABLET | Freq: Two times a day (BID) | ORAL | Status: DC
  Administered 2013-12-21 – 2013-12-22 (×3): 100 mg via ORAL
  Filled 2013-12-21 (×4): qty 1

## 2013-12-21 MED ORDER — HYDROMORPHONE HCL 1 MG/ML IJ SOLN
0.2500 mg | INTRAMUSCULAR | Status: DC | PRN
  Administered 2013-12-21: 0.5 mg via INTRAVENOUS
  Administered 2013-12-21: 1 mg via INTRAVENOUS
  Administered 2013-12-21 (×2): 0.5 mg via INTRAVENOUS

## 2013-12-21 MED ORDER — CLONAZEPAM 0.5 MG PO TABS
0.5000 mg | ORAL_TABLET | Freq: Two times a day (BID) | ORAL | Status: DC | PRN
  Administered 2013-12-21: 0.5 mg via ORAL
  Filled 2013-12-21: qty 1

## 2013-12-21 MED ORDER — BUPIVACAINE HCL (PF) 0.25 % IJ SOLN
INTRAMUSCULAR | Status: DC | PRN
  Administered 2013-12-21: 3 mL

## 2013-12-21 MED ORDER — CYCLOBENZAPRINE HCL 5 MG PO TABS: 5.0000 mg | ORAL_TABLET | Freq: Three times a day (TID) | ORAL | Status: DC | PRN

## 2013-12-21 MED ORDER — SUCCINYLCHOLINE CHLORIDE 20 MG/ML IJ SOLN
INTRAMUSCULAR | Status: AC
  Filled 2013-12-21: qty 1

## 2013-12-21 MED ORDER — FENTANYL CITRATE 0.05 MG/ML IJ SOLN
INTRAMUSCULAR | Status: AC
  Filled 2013-12-21: qty 5

## 2013-12-21 MED ORDER — LISINOPRIL 40 MG PO TABS
40.0000 mg | ORAL_TABLET | Freq: Every day | ORAL | Status: DC
  Administered 2013-12-22: 40 mg via ORAL
  Filled 2013-12-21: qty 1

## 2013-12-21 MED ORDER — NEOSTIGMINE METHYLSULFATE 10 MG/10ML IV SOLN
INTRAVENOUS | Status: AC
  Filled 2013-12-21: qty 1

## 2013-12-21 MED ORDER — ROCURONIUM BROMIDE 100 MG/10ML IV SOLN
INTRAVENOUS | Status: DC | PRN
  Administered 2013-12-21: 30 mg via INTRAVENOUS

## 2013-12-21 MED ORDER — SODIUM CHLORIDE 0.9 % IJ SOLN
INTRAMUSCULAR | Status: AC
  Filled 2013-12-21: qty 10

## 2013-12-21 MED ORDER — ALBUTEROL SULFATE (2.5 MG/3ML) 0.083% IN NEBU: 3.0000 mL | INHALATION_SOLUTION | RESPIRATORY_TRACT | Status: DC | PRN

## 2013-12-21 MED ORDER — ROCURONIUM BROMIDE 50 MG/5ML IV SOLN
INTRAVENOUS | Status: AC
  Filled 2013-12-21: qty 1

## 2013-12-21 MED ORDER — ONDANSETRON HCL 4 MG/2ML IJ SOLN
INTRAMUSCULAR | Status: AC
  Filled 2013-12-21: qty 2

## 2013-12-21 MED ORDER — ENOXAPARIN SODIUM 40 MG/0.4ML ~~LOC~~ SOLN
40.0000 mg | SUBCUTANEOUS | Status: DC
  Administered 2013-12-21: 40 mg via SUBCUTANEOUS
  Filled 2013-12-21 (×2): qty 0.4

## 2013-12-21 MED ORDER — GLYCOPYRROLATE 0.2 MG/ML IJ SOLN
INTRAMUSCULAR | Status: DC | PRN
  Administered 2013-12-21: 0.2 mg via INTRAVENOUS
  Administered 2013-12-21: 0.6 mg via INTRAVENOUS

## 2013-12-21 MED ORDER — ARTIFICIAL TEARS OP OINT
TOPICAL_OINTMENT | OPHTHALMIC | Status: DC | PRN
  Administered 2013-12-21: 1 via OPHTHALMIC

## 2013-12-21 MED ORDER — MELOXICAM 7.5 MG PO TABS
7.5000 mg | ORAL_TABLET | Freq: Every day | ORAL | Status: DC | PRN
  Filled 2013-12-21: qty 1

## 2013-12-21 MED ORDER — MAGNESIUM OXIDE 400 (241.3 MG) MG PO TABS
400.0000 mg | ORAL_TABLET | Freq: Every day | ORAL | Status: DC
  Administered 2013-12-21 – 2013-12-22 (×2): 400 mg via ORAL
  Filled 2013-12-21 (×2): qty 1

## 2013-12-21 MED ORDER — MUPIROCIN 2 % EX OINT: 1.0000 "application " | TOPICAL_OINTMENT | Freq: Every day | CUTANEOUS | Status: DC

## 2013-12-21 MED ORDER — ONDANSETRON HCL 4 MG/2ML IJ SOLN: 4.0000 mg | Freq: Once | INTRAMUSCULAR | Status: DC | PRN

## 2013-12-21 MED ORDER — HYDROMORPHONE HCL 1 MG/ML IJ SOLN: 0.5000 mg | INTRAMUSCULAR | Status: DC | PRN

## 2013-12-21 MED ORDER — FENTANYL CITRATE 0.05 MG/ML IJ SOLN
INTRAMUSCULAR | Status: DC | PRN
  Administered 2013-12-21 (×2): 50 ug via INTRAVENOUS
  Administered 2013-12-21: 100 ug via INTRAVENOUS
  Administered 2013-12-21: 50 ug via INTRAVENOUS

## 2013-12-21 MED ORDER — AMLODIPINE BESYLATE 10 MG PO TABS
10.0000 mg | ORAL_TABLET | Freq: Every day | ORAL | Status: DC
  Administered 2013-12-22: 10 mg via ORAL
  Filled 2013-12-21: qty 1

## 2013-12-21 MED ORDER — CHLORHEXIDINE GLUCONATE 4 % EX LIQD: 1.0000 "application " | Freq: Once | CUTANEOUS | Status: DC

## 2013-12-21 MED ORDER — LACTATED RINGERS IV SOLN
INTRAVENOUS | Status: DC | PRN
  Administered 2013-12-21: 08:00:00 via INTRAVENOUS

## 2013-12-21 MED ORDER — GLYCOPYRROLATE 0.2 MG/ML IJ SOLN
INTRAMUSCULAR | Status: AC
  Filled 2013-12-21: qty 3

## 2013-12-21 SURGICAL SUPPLY — 49 items
BENZOIN TINCTURE PRP APPL 2/3 (GAUZE/BANDAGES/DRESSINGS) ×3 IMPLANT
CANISTER SUCTION 2500CC (MISCELLANEOUS) ×3 IMPLANT
CHLORAPREP W/TINT 10.5 ML (MISCELLANEOUS) ×3 IMPLANT
CLIP TI MEDIUM 6 (CLIP) ×3 IMPLANT
CLIP TI WIDE RED SMALL 6 (CLIP) ×3 IMPLANT
CLOSURE WOUND 1/2 X4 (GAUZE/BANDAGES/DRESSINGS) ×1
CONT SPEC 4OZ CLIKSEAL STRL BL (MISCELLANEOUS) ×3 IMPLANT
COVER SURGICAL LIGHT HANDLE (MISCELLANEOUS) ×3 IMPLANT
DECANTER SPIKE VIAL GLASS SM (MISCELLANEOUS) ×3 IMPLANT
DERMABOND ADVANCED (GAUZE/BANDAGES/DRESSINGS) ×2
DERMABOND ADVANCED .7 DNX12 (GAUZE/BANDAGES/DRESSINGS) ×1 IMPLANT
DRAPE PED LAPAROTOMY (DRAPES) ×3 IMPLANT
DRAPE UTILITY XL STRL (DRAPES) ×6 IMPLANT
ELECT CAUTERY BLADE 6.4 (BLADE) ×3 IMPLANT
ELECT REM PT RETURN 9FT ADLT (ELECTROSURGICAL) ×3
ELECTRODE REM PT RTRN 9FT ADLT (ELECTROSURGICAL) ×1 IMPLANT
GAUZE SPONGE 2X2 8PLY STRL LF (GAUZE/BANDAGES/DRESSINGS) ×1 IMPLANT
GAUZE SPONGE 4X4 16PLY XRAY LF (GAUZE/BANDAGES/DRESSINGS) ×3 IMPLANT
GLOVE BIO SURGEON STRL SZ 6.5 (GLOVE) ×2 IMPLANT
GLOVE BIO SURGEON STRL SZ7.5 (GLOVE) ×3 IMPLANT
GLOVE BIO SURGEONS STRL SZ 6.5 (GLOVE) ×1
GLOVE BIOGEL PI IND STRL 8 (GLOVE) ×1 IMPLANT
GLOVE BIOGEL PI INDICATOR 8 (GLOVE) ×2
GLOVE SURG SIGNA 7.5 PF LTX (GLOVE) ×3 IMPLANT
GOWN STRL REUS W/ TWL LRG LVL3 (GOWN DISPOSABLE) ×2 IMPLANT
GOWN STRL REUS W/TWL LRG LVL3 (GOWN DISPOSABLE) ×4
HEMOSTAT SURGICEL 2X4 FIBR (HEMOSTASIS) ×3 IMPLANT
KIT BASIN OR (CUSTOM PROCEDURE TRAY) ×3 IMPLANT
KIT ROOM TURNOVER OR (KITS) ×3 IMPLANT
NEEDLE HYPO 25GX1X1/2 BEV (NEEDLE) ×3 IMPLANT
NS IRRIG 1000ML POUR BTL (IV SOLUTION) ×3 IMPLANT
PACK SURGICAL SETUP 50X90 (CUSTOM PROCEDURE TRAY) ×3 IMPLANT
PAD ARMBOARD 7.5X6 YLW CONV (MISCELLANEOUS) ×6 IMPLANT
PENCIL BUTTON HOLSTER BLD 10FT (ELECTRODE) ×3 IMPLANT
SPONGE GAUZE 2X2 STER 10/PKG (GAUZE/BANDAGES/DRESSINGS) ×2
SPONGE INTESTINAL PEANUT (DISPOSABLE) ×3 IMPLANT
STRIP CLOSURE SKIN 1/2X4 (GAUZE/BANDAGES/DRESSINGS) ×2 IMPLANT
SUT MNCRL AB 4-0 PS2 18 (SUTURE) ×3 IMPLANT
SUT SILK 2 0 (SUTURE) ×2
SUT SILK 2-0 18XBRD TIE 12 (SUTURE) ×1 IMPLANT
SUT SILK 3 0 (SUTURE) ×2
SUT SILK 3-0 18XBRD TIE 12 (SUTURE) ×1 IMPLANT
SUT VIC AB 3-0 SH 18 (SUTURE) ×3 IMPLANT
SYR BULB 3OZ (MISCELLANEOUS) ×3 IMPLANT
SYR CONTROL 10ML LL (SYRINGE) ×3 IMPLANT
TOWEL OR 17X24 6PK STRL BLUE (TOWEL DISPOSABLE) ×3 IMPLANT
TOWEL OR 17X26 10 PK STRL BLUE (TOWEL DISPOSABLE) ×3 IMPLANT
TUBE CONNECTING 12'X1/4 (SUCTIONS) ×1
TUBE CONNECTING 12X1/4 (SUCTIONS) ×2 IMPLANT

## 2013-12-21 NOTE — Anesthesia Procedure Notes (Signed)
Procedure Name: Intubation Date/Time: 12/21/2013 8:35 AM Performed by: O'LAUGHLIN, KAREN H Pre-anesthesia Checklist: Patient identified, Timeout performed, Emergency Drugs available, Suction available and Patient being monitored Patient Re-evaluated:Patient Re-evaluated prior to inductionOxygen Delivery Method: Circle system utilized Preoxygenation: Pre-oxygenation with 100% oxygen Intubation Type: IV induction Ventilation: Mask ventilation without difficulty and Oral airway inserted - appropriate to patient size Laryngoscope Size: Mac and 4 Grade View: Grade I Tube type: Oral Tube size: 7.5 mm Number of attempts: 1 Airway Equipment and Method: Stylet and LTA kit utilized Placement Confirmation: ETT inserted through vocal cords under direct vision,  positive ETCO2 and breath sounds checked- equal and bilateral Secured at: 22 cm Tube secured with: Tape Dental Injury: Teeth and Oropharynx as per pre-operative assessment      

## 2013-12-21 NOTE — Op Note (Signed)
12/21/2013  9:27 AM  PATIENT:  William Solis  63 y.o. male  PRE-OPERATIVE DIAGNOSIS: PRIMARY HYPERPARATHYROIDISM  POST-OPERATIVE DIAGNOSIS:  PRIMARY HYPERPARATHYROIDISM  PROCEDURE:  Procedure(s): PARATHYROIDECTOMY (N/A)  SURGEON:  Surgeon(s) and Role:    * Ralene Ok, MD - Primary    * Fanny Skates, MD - Assisting  ASSISTANTS: Audelia Acton, PA-student   ANESTHESIA:   local and general  EBL:  Total I/O In: 500 [I.V.:500] Out: 20 [Blood:20]  BLOOD ADMINISTERED:none  DRAINS: none   LOCAL MEDICATIONS USED:  BUPIVICAINE   SPECIMEN:  Source of Specimen:  Left inferior parathyroid  DISPOSITION OF SPECIMEN:  PATHOLOGY  COUNTS:  YES  TOURNIQUET:  * No tourniquets in log *  DICTATION: .Dragon Dictation  Findings: Enlarge Left inferior parathyroid gland  Indications for procedure: The patient is a 63 y/o male who had a h/o elevated calcium and a localized left inferior parathyroid adenoma on sestamibi scan.  Details of the procedure:  The patient was taken back to the operating room. The patient was placed in supine position with bilateral SCDs in place.  The patient was prepped and draped in the usual sterile fashion. After appropriate anitbiotics were confirmed, a time-out was confirmed and all facts were verified. A left sided 3 cm incision was made approximately 2 fingerbreadths above the sternal notch. Bovie cautery was used to maintain hemostasis dissection was carried down through the platysma. The platysma was elevated and flaps were created superiorly and inferiorly to the thyroid cartilage as well as the sternal notch, repsectively. The strap muscles were identified in the midline and separated. Left-sided strap muscles were elevated off the anterior surface of the thyroid. This dissection was carried laterally. We proceeded to dissect away the left thyroid lobe with Kitners from the surrounding musculature from the thyroid.   Upon dissecting laterally, an  enlarge parathyroid gland was identified.  This was dissected out from the surrounding tissue.  Once it was removed it was sent to Pathology for a frozen section confirmation.  The area was irrigated out. The dissection bed was hemostatic. We placed fibrillar hemostatic agent into the wound. Strap muscles were then reapproximated in the midline with interrupted 3-0 Vicryl stitches. The platysma was reapproximated using 3-0 Vicryl stitches in interrupted fashion. Skin was then reapproximated using a running subcuticular 4-0 Monocryl. The skin was then dressed with Dermabond.   Intraoperatively, Pathology confirmed that the specimen was indeed enlarge parathyroid tissue.  The patient was taken to the recovery room in stable condition.   PLAN OF CARE: Admit for overnight observation  PATIENT DISPOSITION:  PACU - hemodynamically stable.   Delay start of Pharmacological VTE agent (>24hrs) due to surgical blood loss or risk of bleeding: not applicable

## 2013-12-21 NOTE — Anesthesia Preprocedure Evaluation (Signed)
Anesthesia Evaluation  Patient identified by MRN, date of birth, ID band Patient awake    Reviewed: Allergy & Precautions, H&P , NPO status , Patient's Chart, lab work & pertinent test results  Airway        Dental   Pulmonary former smoker,          Cardiovascular hypertension,     Neuro/Psych  Neuromuscular disease    GI/Hepatic (+) Hepatitis -, C  Endo/Other    Renal/GU      Musculoskeletal   Abdominal   Peds  (+) ADHD Hematology   Anesthesia Other Findings   Reproductive/Obstetrics                             Anesthesia Physical Anesthesia Plan  ASA: III  Anesthesia Plan: General   Post-op Pain Management:    Induction: Intravenous  Airway Management Planned: Oral ETT  Additional Equipment:   Intra-op Plan:   Post-operative Plan: Extubation in OR  Informed Consent: I have reviewed the patients History and Physical, chart, labs and discussed the procedure including the risks, benefits and alternatives for the proposed anesthesia with the patient or authorized representative who has indicated his/her understanding and acceptance.     Plan Discussed with: CRNA, Anesthesiologist and Surgeon  Anesthesia Plan Comments:         Anesthesia Quick Evaluation

## 2013-12-21 NOTE — Discharge Instructions (Signed)
Parathyroidectomy A parathyroidectomy is surgery to remove one or more parathyroid glands. These glands produce a hormone (parathyroid hormone) that helps control the level of calcium in your body. The glands are very small, about the size of a pea. They are located in your neck, close to your thyroid gland and your Adam's apple. Most people (85%) have four parathyroid glands,some people may have one or two more than that. Hyperparathyroidism is when too much parathyroid hormone is being produced. Usually this is caused by one of the parathyroid glands becoming enlarged, but it can also be caused by more than one of the glands. Hyperparathyroidism is found during blood tests that show high calcium in the blood. Parathyroid hormone levels will also be elevated. Cancer also can cause hyperparathyroidism, but this is rare. For the most common type of hyperparathyroidism, the treatment is surgical removal of the parathyroid gland that is enlarged. For patients with kidney failure and hyperparathyroidism, other treatment will be tried before surgery is done on the parathyroid.  Many times x-ray studies are done to find out which parathyroid gland or glands is malfunctioning. The decision about the best treatment for hyperparathyroidism is between the patient, their primary doctor, an endocrinologist, and a surgeon experienced in parathyroid surgery. LET YOUR CAREGIVER KNOW ABOUT:  Any allergies.  All medications you are taking, including:  Herbs, eyedrops, over-the-counter medications and creams.  Blood thinners (anticoagulants), aspirin or other drugs that could affect blood clotting.  Use of steroids (by mouth or as creams).  Previous problems with anesthetics, including local anesthetics.  Possibility of pregnancy, if this applies.  Any history of blood clots.  Any history of bleeding or other blood problems.  Previous surgery.  Smoking history.  Other health problems. RISKS AND  COMPLICATIONS   Short-term possibilities include:  Excessive bleeding.  Pain.  Infection near the incision.  Slow healing.  Pooling of blood under the wound (hematoma).  Damage to nerves in your neck.  Blood clots.  Difficulty breathing. This is very rare. It also is almost always temporary.  Longer-term possibilities include:  Scarring.  Skin damage.  Damage to blood vessels in the area.  Need for additional surgery.  A hoarse or weak voice. This is usually temporary. It can be the result of nerve damage.  Development of hypoparathyroidism. This means you are not making enough parathyroid hormone. It is rare. If it occurs, you will need to take calcium supplements daily. BEFORE THE PROCEDURE  Sometimes the surgery is done on an outpatient basis. This means you could go home the same day as your surgery. Other times, people need to stay in the hospital overnight. Ask your surgeon what you should expect.  If your surgery will be an outpatient procedure, arrange for someone to drive you home after the surgery.  Two weeks before your surgery, stop using aspirin and non-steroidal anti-inflammatory drugs (NSAID's) for pain relief. This includes prescription drugs and over-the-counter drugs such as ibuprofen and naproxen. Also stop taking vitamin E.  If you take blood-thinners, ask your healthcare provider when you should stop taking them.  Do not eat or drink for about 8 hours before your surgery.  You might be asked to shower or wash with a special antibacterial soap before the procedure.  Arrive at least an hour before the surgery, or whenever your surgeon recommends. This will give you time to check in and fill out any needed paperwork. PROCEDURE  The preparation:  You will change into a hospital gown.  You  will be given an IV. A needle will be inserted in your arm. Medication will be able to flow directly into your body through this needle.  You might be given a  sedative to help you relax.  You will be given a drug that puts you to sleep during the surgery (general anesthetic).  The procedure:  Once you are asleep, the surgeon will make a small cut (incision) in your lower neck. Ask your surgeon where the incision will be.  The surgeon will look for the gland(s) that are not working well. Often a tissue sample from a gland is used to determine this.  Any glands that are not working well will be removed.  The surgeon will close the incision with stitches, often these are hidden under the skin. AFTER THE PROCEDURE  You will stay in a recovery area until the anesthesia has worn off. Your blood pressure and heart rate will be checked.  If your surgery was an outpatient procedure, you will go home the same day.  If you need to stay in the hospital, you will be moved to a hospital room. You will probably stay for two to three days. This will depend on how quickly you recover.  While you are in the hospital, your blood will be tested to check the calcium levels in your body. HOME CARE INSTRUCTIONS   Take any medication that your surgeon prescribes. Follow the directions carefully. Take all of the medication.  Ask your surgeon whether you can take over-the-counter medicines for pain, discomfort or fever. Do not take aspirin without permission from the surgeon. Aspirin increases the chances of bleeding.  Do not get the wound wet for the first few days after surgery (or until the surgeon tells you it is OK).  After this procedure, many patients may develop low calcium levels in the blood. It is critical that you see your medical caregiver to have this monitored and managed.     SEEK MEDICAL CARE IF:   You notice blood or fluid leaking from the wound, or it becomes red or swollen.  You have trouble breathing.  You have trouble speaking.  You become nauseous or throw up for more than two days after the surgery.  You have a fever or  persistent symptoms for more than 2-3 days. SEEK IMMEDIATE MEDICAL CARE IF:   Breathing becomes more difficult.  You have a fever and your symptoms suddenly get worse. Document Released: 04/04/2008 Document Revised: 12/24/2011 Document Reviewed: 04/04/2008 [facility] Patient Information 2015 [place], [place]. This information is not intended to replace advice given to you by your health care provider. Make sure you discuss any questions you have with your health care provider.

## 2013-12-21 NOTE — Interval H&P Note (Signed)
History and Physical Interval Note:  12/21/2013 7:35 AM  William Solis  has presented today for surgery, with the diagnosis of HYPERPARATHYROIDISM  The various methods of treatment have been discussed with the patient and family. After consideration of risks, benefits and other options for treatment, the patient has consented to  Procedure(s): PARATHYROIDECTOMY (N/A) as a surgical intervention .  The patient's history has been reviewed, patient examined, no change in status, stable for surgery.  I have reviewed the patient's chart and labs.  Questions were answered to the patient's satisfaction.     Rosario Jacks., Anne Hahn

## 2013-12-21 NOTE — Progress Notes (Signed)
Spoke with Dr. Tamala Julian, G. Relative to EKG, declines need for CXR.  Pt. Reports that he recently was treated for Bronchitis ( had a CXR at that time at [facility] on [facility]) , in fact wife has a CD with the images on it.  Pt. Denies all chest symptoms.  Pt. remains on Doxycycline for L foot, ray amputation.

## 2013-12-21 NOTE — Progress Notes (Signed)
Patient complaining of pain, states he needs more pain medication.  Noted patient has had 2 mg of dilaudid already, Called Dr. Tamala Julian for approval for additional dilaudid, which was given

## 2013-12-21 NOTE — Anesthesia Postprocedure Evaluation (Signed)
  Anesthesia Post-op Note  Patient: William Solis  Procedure(s) Performed: Procedure(s): PARATHYROIDECTOMY (N/A)  Patient Location: PACU  Anesthesia Type:General  Level of Consciousness: awake, alert , oriented and patient cooperative  Airway and Oxygen Therapy: Patient Spontanous Breathing  Post-op Pain: none  Post-op Assessment: Post-op Vital signs reviewed, Patient's Cardiovascular Status Stable, Respiratory Function Stable, Patent Airway, No signs of Nausea or vomiting and Pain level controlled  Post-op Vital Signs: stable  Last Vitals:  Filed Vitals:   12/21/13 1030  BP: 124/85  Pulse:   Temp:   Resp:     Complications: No apparent anesthesia complications

## 2013-12-21 NOTE — Progress Notes (Signed)
PHARMACIST - PHYSICIAN ORDER COMMUNICATION  CONCERNING: P&T Medication Policy on Herbal Medications  DESCRIPTION:  This patient's order for:  melatonin  has been noted.  This product(s) is classified as an "herbal" or natural product. Due to a lack of definitive safety studies or FDA approval, nonstandard manufacturing practices, plus the potential risk of unknown drug-drug interactions while on inpatient medications, the Pharmacy and Therapeutics Committee does not permit the use of "herbal" or natural products of this type within [facility].   ACTION TAKEN: The pharmacy department is unable to verify this order at this time and your patient has been informed of this safety policy. Please reevaluate patient's clinical condition at discharge and address if the herbal or natural product(s) should be resumed at that time.  [place], Pharm.D., BCPS Clinical Pharmacist Pager: [phone] 12/21/2013 12:10 PM

## 2013-12-21 NOTE — Transfer of Care (Signed)
Immediate Anesthesia Transfer of Care Note  Patient: William Solis  Procedure(s) Performed: Procedure(s): PARATHYROIDECTOMY (N/A)  Patient Location: PACU  Anesthesia Type:General  Level of Consciousness: awake, alert  and oriented  Airway & Oxygen Therapy: Patient Spontanous Breathing and Patient connected to nasal cannula oxygen  Post-op Assessment: Report given to PACU RN and Post -op Vital signs reviewed and stable  Post vital signs: Reviewed and stable  Complications: No apparent anesthesia complications

## 2013-12-22 ENCOUNTER — Other Ambulatory Visit (INDEPENDENT_AMBULATORY_CARE_PROVIDER_SITE_OTHER): Payer: Self-pay

## 2013-12-22 DIAGNOSIS — E210 Primary hyperparathyroidism: Secondary | ICD-10-CM | POA: Diagnosis not present

## 2013-12-22 DIAGNOSIS — Z9889 Other specified postprocedural states: Secondary | ICD-10-CM

## 2013-12-22 DIAGNOSIS — E892 Postprocedural hypoparathyroidism: Secondary | ICD-10-CM

## 2013-12-22 LAB — BASIC METABOLIC PANEL
ANION GAP: 10 (ref 5–15)
BUN: 18 mg/dL (ref 6–23)
CO2: 31 meq/L (ref 19–32)
CREATININE: 1.18 mg/dL (ref 0.50–1.35)
Calcium: 10.1 mg/dL (ref 8.4–10.5)
Chloride: 101 mEq/L (ref 96–112)
GFR calc Af Amer: 75 mL/min — ABNORMAL LOW (ref >90)
GFR calc non Af Amer: 65 mL/min — ABNORMAL LOW (ref >90)
Glucose, Bld: 83 mg/dL (ref 70–99)
Potassium: 4.3 mEq/L (ref 3.7–5.3)
SODIUM: 142 meq/L (ref 137–147)

## 2013-12-22 MED ORDER — MAGNESIUM OXIDE 400 (241.3 MG) MG PO TABS: 400.0000 mg | ORAL_TABLET | Freq: Every day | ORAL | Status: DC

## 2013-12-22 MED ORDER — CALCIUM CARBONATE-VITAMIN D 500-200 MG-UNIT PO TABS: 2.0000 | ORAL_TABLET | Freq: Four times a day (QID) | ORAL | Status: DC

## 2013-12-22 NOTE — Discharge Summary (Signed)
Physician Discharge Summary  Patient ID: William Solis MRN: [id number] DOB/AGE: 09/26/1950 63 y.o.  Admit date: 12/21/2013 Discharge date: 12/22/2013  Admission Diagnoses: primary hyperparathyroidism  Discharge Diagnoses:  Active Problems:   S/P removal of parathyroid gland   Discharged Condition: good  Hospital Course: 63 y/o M admitted post op.  Pt did well with pain.  Ca normal post op.  On ca suppl. deemd stbale fo Dc  Consults: None  Significant Diagnostic Studies: labs: Ca -wNL  Treatments: surgery: as above  Discharge Exam: Blood pressure 123/69, pulse 66, temperature 98.1 F (36.7 C), temperature source Oral, resp. rate 16, height 6' (1.829 m), weight 220 lb (99.791 kg), SpO2 94 %. General appearance: alert and cooperative Incision/Wound: c/d/i  Disposition: 01-Home or Self Care  Discharge Instructions    Diet - low sodium heart healthy    Complete by:  As directed      Increase activity slowly    Complete by:  As directed             Medication List    TAKE these medications        albuterol 108 (90 BASE) MCG/ACT inhaler  Commonly known as:  PROVENTIL HFA;VENTOLIN HFA  Inhale 2 puffs into the lungs every 4 (four) hours as needed for wheezing or shortness of breath.     amLODipine 10 MG tablet  Commonly known as:  NORVASC  Take 10 mg by mouth daily.     benzonatate 200 MG capsule  Commonly known as:  TESSALON  Take 200 mg by mouth 3 (three) times daily as needed for cough.     bisacodyl 5 MG EC tablet  Commonly known as:  DULCOLAX  Take 5 mg by mouth daily as needed for mild constipation or moderate constipation.     calcium-vitamin D 500-200 MG-UNIT per tablet  Commonly known as:  OSCAL WITH D  Take 2 tablets by mouth 4 (four) times daily.     clonazePAM 0.5 MG tablet  Commonly known as:  KLONOPIN  Take 0.5 mg by mouth 2 (two) times daily as needed for anxiety.     cyclobenzaprine 10 MG tablet  Commonly known as:  FLEXERIL  Take 5 mg  by mouth 3 (three) times daily as needed for muscle spasms.     doxycycline 100 MG tablet  Commonly known as:  VIBRA-TABS  Take 100 mg by mouth 2 (two) times daily. Patient has 2 pills left as of 11-15-13     FISH OIL PO  Take 1,000 mg by mouth daily.     fluticasone 50 MCG/ACT nasal spray  Commonly known as:  FLONASE  Place 1 spray into both nostrils daily as needed for rhinitis.     gabapentin 600 MG tablet  Commonly known as:  NEURONTIN  Take 600 mg by mouth at bedtime as needed (pain).     HARVONI 90-400 MG Tabs  Generic drug:  Ledipasvir-Sofosbuvir  Take 1 tablet by mouth daily.     LEXAPRO 20 MG tablet  Generic drug:  escitalopram  Take 30 mg by mouth daily.     lisinopril 40 MG tablet  Commonly known as:  PRINIVIL,ZESTRIL  Take 40 mg by mouth daily.     magnesium oxide 400 (241.3 MG) MG tablet  Commonly known as:  MAG-OX  Take 1 tablet (400 mg total) by mouth daily.     Melatonin 5 MG Tabs  Take 0.5 tablets by mouth at bedtime.     meloxicam  7.5 MG tablet  Commonly known as:  MOBIC  Take 7.5 mg by mouth daily as needed. For pain in thumbs     methylphenidate 20 MG tablet  Commonly known as:  RITALIN  Take 20 mg by mouth daily.     metoprolol 50 MG tablet  Commonly known as:  LOPRESSOR  Take 50 mg by mouth 2 (two) times daily.     mupirocin ointment 2 %  Commonly known as:  BACTROBAN  Place 1 application into the nose daily. For affected toe per patient     OVER THE COUNTER MEDICATION  Take 1 tablet by mouth daily as needed. Chest congestion relief. Guaifenesin 400mg      oxycodone 5 MG capsule  Commonly known as:  OXY-IR  Take 2 capsules (10 mg total) by mouth every 4 (four) hours as needed for pain.     vitamin C 1000 MG tablet  Take 1,000 mg by mouth daily.           Follow-up Information    Schedule an appointment as soon as possible for a visit in 2 weeks to follow up.   Why:  For wound re-check      Signed: Rosario Jacks.,  Anne Hahn 12/22/2013, 8:07 AM

## 2013-12-23 ENCOUNTER — Encounter (HOSPITAL_COMMUNITY): Payer: Self-pay | Admitting: General Surgery

## 2014-01-04 LAB — CALCIUM: CALCIUM: 9.4 mg/dL (ref 8.4–10.5)

## 2014-09-26 ENCOUNTER — Ambulatory Visit (INDEPENDENT_AMBULATORY_CARE_PROVIDER_SITE_OTHER): Payer: BLUE CROSS/BLUE SHIELD | Admitting: Podiatry

## 2014-09-26 ENCOUNTER — Encounter: Payer: Self-pay | Admitting: Podiatry

## 2014-09-26 VITALS — BP 117/76 | HR 78

## 2014-09-26 DIAGNOSIS — M79675 Pain in left toe(s): Secondary | ICD-10-CM | POA: Insufficient documentation

## 2014-09-26 DIAGNOSIS — M2042 Other hammer toe(s) (acquired), left foot: Secondary | ICD-10-CM

## 2014-09-26 MED ORDER — AMOXICILLIN-POT CLAVULANATE 875-125 MG PO TABS: 1.0000 | ORAL_TABLET | Freq: Two times a day (BID) | ORAL | Status: DC

## 2014-09-26 NOTE — Patient Instructions (Signed)
Seen for contracted toes following 2nd toe removal. May benefit from Tenotomy 3rd and 4th left foot in office. Need 45 minute appointment.

## 2014-09-26 NOTE — Progress Notes (Signed)
SUBJECTIVE: 64 year old male presents complaining of painful contracted 3rd and 4th toe following removal of 2nd toe left foot. Patient was seen here last year. He was sent to Wound care center in August 2015 for possible bone infection on 2nd toe left. That was amputated due to failure to improve.   Past history reveals Neuropathy since 2006 after back surgery. Stated that the surgery took away back pain, but left him with both feet getting numb and tingling, burning and shooting pain.   OBJECTIVE: DERMATOLOGIC EXAMINATION: Red swollen toe 2nd left with serosanguinous drainage from a small sinus tract opening at plantar distal end.  VASCULAR EXAMINATION OF LOWER LIMBS: Pedal pulses: All pedal pulses are palpable with normal pulsation.  Red cellulitis limited to 2nd digit left foot.  Mild increase in temperature left foot compared with the right.  NEUROLOGIC EXAMINATION OF THE LOWER LIMBS: Failed response to Monofilament (Semmes-Weinstein 10-gm) sensory testing positive 6 out of 6, bilateral. Failed Vibratory sensations('128Hz'$  turning fork) forefoot bilateral.  He takes Neurontin at night to alleviate tingling and pain at bed time.  MUSCULOSKELETAL EXAMINATION: Loss of 2nd digit and metatarsal bone left foot. Contracted 3rd and 4th digits left foot symptomatic.   ASSESSMENT: Hammer toe deformity 3rd and 4th left. Status post 2nd ray amputation following non healing osteomyelitis left foot.   PLAN: Reviewed findings and available options. Reviewed possible benefit of in office Flexor tenotomy prior to go on with full Hammer toe procedure.  Patient agreed to try simple procedure first. Prophylactic antibiotic prescribed.

## 2014-09-29 ENCOUNTER — Ambulatory Visit (INDEPENDENT_AMBULATORY_CARE_PROVIDER_SITE_OTHER): Payer: BLUE CROSS/BLUE SHIELD | Admitting: Podiatry

## 2014-09-29 ENCOUNTER — Encounter: Payer: Self-pay | Admitting: Podiatry

## 2014-09-29 VITALS — BP 150/86 | HR 80

## 2014-09-29 DIAGNOSIS — M79675 Pain in left toe(s): Secondary | ICD-10-CM | POA: Diagnosis not present

## 2014-09-29 DIAGNOSIS — M2042 Other hammer toe(s) (acquired), left foot: Secondary | ICD-10-CM | POA: Diagnosis not present

## 2014-09-29 NOTE — Progress Notes (Signed)
SUBJECTIVE: 64 year old male presents for a scheduled procedure, Flexor tenotomy 3rd and 4th left foot.  He was prescribed of prophylactic antibiotics and is taking.  His wife accompanied for the visit.   OBJECTIVE: DERMATOLOGIC EXAMINATION: Normal findings.  VASCULAR EXAMINATION OF LOWER LIMBS: Pedal pulses: All pedal pulses are palpable with normal pulsation.  No edema or erythema noted. NEUROLOGIC EXAMINATION OF THE LOWER LIMBS: Loss of sensory perception both feet. He takes Neurontin at night to alleviate tingling and pain at bed time.  MUSCULOSKELETAL EXAMINATION: Loss of 2nd digit and metatarsal bone left foot. Contracted 3rd and 4th digits left foot symptomatic.   ASSESSMENT: Hammer toe deformity 3rd and 4th left. Status post 2nd ray amputation following non healing osteomyelitis left foot.   PLAN: Pre op consent form was reviewed for Flexor tenotomy 3rd and 4th left foot. Both toes injected with total 49m of 50/50 mixture 1% Xylocaine with epi and 0.5% Marcaine plain. After left foot was prepped and draped the following procedure performed.  Procedure performed: Flexor tenotomy 3rd, 4th left foot. About 0.6 cm stab incision placed horizontally under the sulcus of both digits. Contracted joint, PIPJ identified and release with #15 blade. The digits were then noted of weakened strength in flexion contraction about 50%. Area was well flushed with sterile solution and closed with 5/0 Nylon.  Sterile compression bandage applied.  Left foot placed in surgical shoe. Home care instruction given to both wife and the patient.

## 2014-09-29 NOTE — Patient Instructions (Signed)
Office procedure done. Follow instruction. Keep the bandage dry. Return in one week.

## 2014-10-02 ENCOUNTER — Telehealth: Payer: Self-pay | Admitting: *Deleted

## 2014-10-02 NOTE — Telephone Encounter (Signed)
10/02/14 Pt called this am and said something come off of his bandages and he changed it and will have the clinic nurse at his work check it tomorrow on Tuesday and will keep his appt with [place] on Friday. I ask him if he wanted to come in today but he said every thing is all right. He will see [place] on Friday.

## 2014-10-04 ENCOUNTER — Ambulatory Visit (INDEPENDENT_AMBULATORY_CARE_PROVIDER_SITE_OTHER): Payer: BLUE CROSS/BLUE SHIELD | Admitting: Podiatry

## 2014-10-04 ENCOUNTER — Encounter: Payer: Self-pay | Admitting: Podiatry

## 2014-10-04 DIAGNOSIS — M2042 Other hammer toe(s) (acquired), left foot: Secondary | ICD-10-CM

## 2014-10-04 NOTE — Progress Notes (Signed)
Patient got the dressing wet and came in with loose dressing. Wound is clean with minimum erythema. Area cleansed with Iodine and redressed. Home care instruction given.

## 2014-10-04 NOTE — Patient Instructions (Signed)
Dressing changed. Wound healing well. Ok to shower over the dressing and change with dry sterile gauze after each shower. Return next week.

## 2014-10-06 ENCOUNTER — Ambulatory Visit: Payer: BLUE CROSS/BLUE SHIELD | Admitting: Podiatry

## 2014-10-09 ENCOUNTER — Encounter: Payer: Self-pay | Admitting: Podiatry

## 2014-10-09 ENCOUNTER — Ambulatory Visit (INDEPENDENT_AMBULATORY_CARE_PROVIDER_SITE_OTHER): Payer: BLUE CROSS/BLUE SHIELD | Admitting: Podiatry

## 2014-10-09 DIAGNOSIS — M2042 Other hammer toe(s) (acquired), left foot: Secondary | ICD-10-CM

## 2014-10-09 NOTE — Patient Instructions (Signed)
Post op wound healing well. Sutures removed, home care instruction given. Return in one month for routine check.

## 2014-10-09 NOTE — Progress Notes (Signed)
Post op wound following flexor tenotomy 3rd and 4th left. Patient denies any problem.  Wound is healing well. Skin is well coapted.  Sutures removed. Home care instruction given.  Return in one month for routine check or sooner if needed.

## 2014-11-08 ENCOUNTER — Encounter: Payer: Self-pay | Admitting: Podiatry

## 2014-11-08 ENCOUNTER — Ambulatory Visit (INDEPENDENT_AMBULATORY_CARE_PROVIDER_SITE_OTHER): Payer: BLUE CROSS/BLUE SHIELD | Admitting: Podiatry

## 2014-11-08 DIAGNOSIS — M2042 Other hammer toe(s) (acquired), left foot: Secondary | ICD-10-CM

## 2014-11-08 NOTE — Progress Notes (Signed)
Doing well with 2nd toe left. Healed well following tenotomy. Has some friction on top. Gel toe pad dispensed. He is having increased nerve pain in lower limbs that started after he had back surgery several years ago.  Discussed the nature of Neuropathy.  Return as needed.

## 2014-11-08 NOTE — Patient Instructions (Signed)
2nd toe left doing well except some friction in shoes. May use Gel toe pad to protect skin. Ok to increase Neurontin to 600 from 300 if nerve pain persist.  Return as needed.

## 2015-02-16 ENCOUNTER — Emergency Department (HOSPITAL_BASED_OUTPATIENT_CLINIC_OR_DEPARTMENT_OTHER)
Admission: EM | Admit: 2015-02-16 | Discharge: 2015-02-16 | Disposition: A | Discharge status: Home / Self Care | Payer: BLUE CROSS/BLUE SHIELD | Attending: Emergency Medicine | Admitting: Emergency Medicine

## 2015-02-16 ENCOUNTER — Encounter (HOSPITAL_BASED_OUTPATIENT_CLINIC_OR_DEPARTMENT_OTHER): Payer: Self-pay | Admitting: *Deleted

## 2015-02-16 ENCOUNTER — Emergency Department (HOSPITAL_BASED_OUTPATIENT_CLINIC_OR_DEPARTMENT_OTHER): Payer: BLUE CROSS/BLUE SHIELD

## 2015-02-16 DIAGNOSIS — G4700 Insomnia, unspecified: Secondary | ICD-10-CM | POA: Diagnosis not present

## 2015-02-16 DIAGNOSIS — Z79899 Other long term (current) drug therapy: Secondary | ICD-10-CM | POA: Insufficient documentation

## 2015-02-16 DIAGNOSIS — K529 Noninfective gastroenteritis and colitis, unspecified: Secondary | ICD-10-CM | POA: Diagnosis not present

## 2015-02-16 DIAGNOSIS — Z792 Long term (current) use of antibiotics: Secondary | ICD-10-CM | POA: Insufficient documentation

## 2015-02-16 DIAGNOSIS — F909 Attention-deficit hyperactivity disorder, unspecified type: Secondary | ICD-10-CM | POA: Diagnosis not present

## 2015-02-16 DIAGNOSIS — I10 Essential (primary) hypertension: Secondary | ICD-10-CM | POA: Insufficient documentation

## 2015-02-16 DIAGNOSIS — F329 Major depressive disorder, single episode, unspecified: Secondary | ICD-10-CM | POA: Diagnosis not present

## 2015-02-16 DIAGNOSIS — Z8619 Personal history of other infectious and parasitic diseases: Secondary | ICD-10-CM | POA: Diagnosis not present

## 2015-02-16 DIAGNOSIS — Z87891 Personal history of nicotine dependence: Secondary | ICD-10-CM | POA: Insufficient documentation

## 2015-02-16 DIAGNOSIS — E669 Obesity, unspecified: Secondary | ICD-10-CM | POA: Diagnosis not present

## 2015-02-16 DIAGNOSIS — F419 Anxiety disorder, unspecified: Secondary | ICD-10-CM | POA: Insufficient documentation

## 2015-02-16 DIAGNOSIS — G5793 Unspecified mononeuropathy of bilateral lower limbs: Secondary | ICD-10-CM | POA: Insufficient documentation

## 2015-02-16 DIAGNOSIS — Z7951 Long term (current) use of inhaled steroids: Secondary | ICD-10-CM | POA: Insufficient documentation

## 2015-02-16 DIAGNOSIS — R109 Unspecified abdominal pain: Secondary | ICD-10-CM

## 2015-02-16 DIAGNOSIS — R1030 Lower abdominal pain, unspecified: Secondary | ICD-10-CM | POA: Diagnosis present

## 2015-02-16 LAB — CBC WITH DIFFERENTIAL/PLATELET
Basophils Absolute: 0 10*3/uL (ref 0.0–0.1)
Basophils Relative: 0 %
Eosinophils Absolute: 0.1 10*3/uL (ref 0.0–0.7)
Eosinophils Relative: 1 %
HEMATOCRIT: 43.4 % (ref 39.0–52.0)
Hemoglobin: 14.5 g/dL (ref 13.0–17.0)
LYMPHS ABS: 1.1 10*3/uL (ref 0.7–4.0)
Lymphocytes Relative: 9 %
MCH: 29.7 pg (ref 26.0–34.0)
MCHC: 33.4 g/dL (ref 30.0–36.0)
MCV: 88.9 fL (ref 78.0–100.0)
MONOS PCT: 11 %
Monocytes Absolute: 1.3 10*3/uL — ABNORMAL HIGH (ref 0.1–1.0)
NEUTROS PCT: 79 %
Neutro Abs: 9.5 10*3/uL — ABNORMAL HIGH (ref 1.7–7.7)
Platelets: 124 10*3/uL — ABNORMAL LOW (ref 150–400)
RBC: 4.88 MIL/uL (ref 4.22–5.81)
RDW: 12.7 % (ref 11.5–15.5)
WBC: 12.1 10*3/uL — ABNORMAL HIGH (ref 4.0–10.5)

## 2015-02-16 LAB — URINALYSIS, ROUTINE W REFLEX MICROSCOPIC
Bilirubin Urine: NEGATIVE
GLUCOSE, UA: NEGATIVE mg/dL
HGB URINE DIPSTICK: NEGATIVE
KETONES UR: NEGATIVE mg/dL
LEUKOCYTES UA: NEGATIVE
Nitrite: NEGATIVE
PROTEIN: NEGATIVE mg/dL
Specific Gravity, Urine: 1.014 (ref 1.005–1.030)
pH: 6.5 (ref 5.0–8.0)

## 2015-02-16 LAB — COMPREHENSIVE METABOLIC PANEL
ALT: 52 U/L (ref 17–63)
AST: 44 U/L — AB (ref 15–41)
Albumin: 3.3 g/dL — ABNORMAL LOW (ref 3.5–5.0)
Alkaline Phosphatase: 59 U/L (ref 38–126)
Anion gap: 8 (ref 5–15)
BILIRUBIN TOTAL: 0.6 mg/dL (ref 0.3–1.2)
BUN: 22 mg/dL — AB (ref 6–20)
CO2: 25 mmol/L (ref 22–32)
Calcium: 8.5 mg/dL — ABNORMAL LOW (ref 8.9–10.3)
Chloride: 104 mmol/L (ref 101–111)
Creatinine, Ser: 1.1 mg/dL (ref 0.61–1.24)
Glucose, Bld: 147 mg/dL — ABNORMAL HIGH (ref 65–99)
POTASSIUM: 3.9 mmol/L (ref 3.5–5.1)
Sodium: 137 mmol/L (ref 135–145)
TOTAL PROTEIN: 6.8 g/dL (ref 6.5–8.1)

## 2015-02-16 LAB — LIPASE, BLOOD: LIPASE: 27 U/L (ref 11–51)

## 2015-02-16 MED ORDER — IOHEXOL 300 MG/ML  SOLN
25.0000 mL | Freq: Once | INTRAMUSCULAR | Status: AC | PRN
  Administered 2015-02-16: 25 mL via ORAL

## 2015-02-16 MED ORDER — METRONIDAZOLE 500 MG PO TABS
500.0000 mg | ORAL_TABLET | Freq: Once | ORAL | Status: AC
  Administered 2015-02-16: 500 mg via ORAL
  Filled 2015-02-16: qty 1

## 2015-02-16 MED ORDER — CIPROFLOXACIN HCL 500 MG PO TABS
500.0000 mg | ORAL_TABLET | Freq: Once | ORAL | Status: AC
  Administered 2015-02-16: 500 mg via ORAL
  Filled 2015-02-16: qty 1

## 2015-02-16 MED ORDER — HYDROMORPHONE HCL 1 MG/ML IJ SOLN
1.0000 mg | Freq: Once | INTRAMUSCULAR | Status: AC
Start: 2015-02-16 — End: 2015-02-16
  Administered 2015-02-16: 1 mg via INTRAVENOUS
  Filled 2015-02-16: qty 1

## 2015-02-16 MED ORDER — METRONIDAZOLE 500 MG PO TABS: 500.0000 mg | ORAL_TABLET | Freq: Three times a day (TID) | ORAL | Status: DC

## 2015-02-16 MED ORDER — IOHEXOL 300 MG/ML  SOLN
100.0000 mL | Freq: Once | INTRAMUSCULAR | Status: AC | PRN
  Administered 2015-02-16: 100 mL via INTRAVENOUS

## 2015-02-16 MED ORDER — CIPROFLOXACIN HCL 500 MG PO TABS: 500.0000 mg | ORAL_TABLET | Freq: Two times a day (BID) | ORAL | Status: DC

## 2015-02-16 MED ORDER — HYDROMORPHONE HCL 1 MG/ML IJ SOLN
1.0000 mg | Freq: Once | INTRAMUSCULAR | Status: AC
  Administered 2015-02-16: 1 mg via INTRAVENOUS
  Filled 2015-02-16: qty 1

## 2015-02-16 MED FILL — metroNIDAZOLE 500 MG TABS: 500 | 7 days supply | Qty: 21 | Fill #0

## 2015-02-16 MED FILL — CIPROFLOXACIN HCL 500 MG TA: 500 | 7 days supply | Qty: 14 | Fill #0

## 2015-02-16 NOTE — ED Provider Notes (Signed)
CSN: [id number]     Arrival date & time 02/16/15  1304 History   First MD Initiated Contact with Patient 02/16/15 1342     Chief Complaint  Patient presents with  . Abdominal Pain     (Consider location/radiation/quality/duration/timing/severity/associated sxs/prior Treatment) HPI William Solis is a 65 y.o. male history of hypertension, hepatitis C,, neuropathy of the lower extremities, comes in for evaluation of abdominal pain. Patient reports yesterday he had gradual onset of midline lower abdominal pain that has since moved to his right lower quadrant. He takes oxycodone for his chronic back pain, but this is not helping with his abdominal pain. He denies any nausea or vomiting but reports a fever of 99.6 at home. Denies any urinary symptoms, unusual back pain, chest pain or shortness of breath, diarrhea or constipation. Last bowel movement this morning and was normal for him. Symptoms are worsened with certain movements. Denies any discomfort now while lying still. No other modifying factors.  Past Medical History  Diagnosis Date  . ADHD, adult residual type     takes Ritalin daily  . Anxiety     takes Klonopin daily  . Hypertension     takes Lisinopril,Metoprolol,and Amlodipine daily  . Muscle spasm     takes Flexeril daily  . Depression     takes Lexapro daily  . Neuropathy involving both lower extremities (HCC)     takes Gabapentin daily  . Insomnia     takes Melatonin nightly  . Hepatitis C     takes Harvoni daily  . Staph infection     Mupirocin being used   Past Surgical History  Procedure Laterality Date  . Back surgery  2006    Disc  . Sinus exploration  2005/2007  . Amputation Left 10/21/2013    Procedure: LEFT FOOT SECOND RAY AMPUTATION ;  Surgeon: [name], MD;  Location: [facility];  Service: Orthopedics;  Laterality: Left;  . Tee without cardioversion N/A 10/24/2013    Procedure: TRANSESOPHAGEAL ECHOCARDIOGRAM (TEE);  Surgeon: Fay Records, MD;  Location:  [facility] ENDOSCOPY;  Service: Cardiovascular;  Laterality: N/A;  . Parathyroidectomy  12/21/2013    dr Rosendo Gros  . Parathyroidectomy N/A 12/21/2013    Procedure: PARATHYROIDECTOMY;  Surgeon: Ralene Ok, MD;  Location: [place];  Service: General;  Laterality: N/A;   Family History  Problem Relation Age of Onset  . Hypertension Mother   . Hypertension Father   . Diabetes type II Neg Hx    Social History  Substance Use Topics  . Smoking status: Former Smoker    Types: Cigarettes, Cigars    Quit date: 10/20/1990  . Smokeless tobacco: Never Used  . Alcohol Use: No    Review of Systems A 10 point review of systems was completed and was negative except for pertinent positives and negatives as mentioned in the history of present illness     Allergies  Review of patient's allergies indicates no known allergies.  Home Medications   Prior to Admission medications   Medication Sig Start Date End Date Taking? Authorizing Provider  carvedilol (COREG) 25 MG tablet Take 25 mg by mouth 2 (two) times daily with a meal.   Yes Historical Provider, MD  Lisdexamfetamine Dimesylate (VYVANSE PO) Take by mouth.   Yes Historical Provider, MD  sulfamethoxazole-trimethoprim (BACTRIM,SEPTRA) 400-80 MG tablet Take 1 tablet by mouth 2 (two) times daily.   Yes Historical Provider, MD  albuterol (PROVENTIL HFA;VENTOLIN HFA) 108 (90 BASE) MCG/ACT inhaler Inhale 2 puffs  into the lungs every 4 (four) hours as needed for wheezing or shortness of breath.    Historical Provider, MD  amLODipine (NORVASC) 10 MG tablet Take 10 mg by mouth daily.    Historical Provider, MD  amoxicillin-clavulanate (AUGMENTIN) 875-125 MG per tablet Take 1 tablet by mouth 2 (two) times daily. 09/26/14   Myeong O Sheard, DPM  Ascorbic Acid (VITAMIN C) 1000 MG tablet Take 1,000 mg by mouth daily.    Historical Provider, MD  benzonatate (TESSALON) 200 MG capsule Take 200 mg by mouth 3 (three) times daily as needed for cough.    Historical  Provider, MD  bisacodyl (DULCOLAX) 5 MG EC tablet Take 5 mg by mouth daily as needed for mild constipation or moderate constipation.    Historical Provider, MD  calcium-vitamin D (OSCAL WITH D) 500-200 MG-UNIT per tablet Take 2 tablets by mouth 4 (four) times daily. 12/22/13   Ralene Ok, MD  ciprofloxacin (CIPRO) 500 MG tablet Take 1 tablet (500 mg total) by mouth 2 (two) times daily. 02/16/15   Comer Locket, PA-C  clonazePAM (KLONOPIN) 0.5 MG tablet Take 0.5 mg by mouth 2 (two) times daily as needed for anxiety. 10/24/13   Kinnie Feil, MD  cyclobenzaprine (FLEXERIL) 10 MG tablet Take 5 mg by mouth 3 (three) times daily as needed for muscle spasms.     Historical Provider, MD  doxycycline (VIBRA-TABS) 100 MG tablet Take 100 mg by mouth 2 (two) times daily. Patient has 2 pills left as of 11-15-13    Historical Provider, MD  escitalopram (LEXAPRO) 20 MG tablet Take 30 mg by mouth daily.    Historical Provider, MD  fluticasone (FLONASE) 50 MCG/ACT nasal spray Place 1 spray into both nostrils daily as needed for rhinitis.     Historical Provider, MD  gabapentin (NEURONTIN) 600 MG tablet Take 600 mg by mouth at bedtime as needed (pain).     Historical Provider, MD  Ledipasvir-Sofosbuvir (HARVONI) 90-400 MG TABS Take 1 tablet by mouth daily.    Historical Provider, MD  lisinopril (PRINIVIL,ZESTRIL) 40 MG tablet Take 40 mg by mouth daily.    Historical Provider, MD  magnesium oxide (MAG-OX) 400 (241.3 MG) MG tablet Take 1 tablet (400 mg total) by mouth daily. 12/22/13   Ralene Ok, MD  Melatonin 5 MG TABS Take 0.5 tablets by mouth at bedtime.     Historical Provider, MD  meloxicam (MOBIC) 7.5 MG tablet Take 7.5 mg by mouth daily as needed. For pain in thumbs    Historical Provider, MD  methylphenidate (RITALIN) 20 MG tablet Take 20 mg by mouth daily.    Historical Provider, MD  metoprolol (LOPRESSOR) 50 MG tablet Take 50 mg by mouth 2 (two) times daily.  10/24/13   Kinnie Feil, MD   metroNIDAZOLE (FLAGYL) 500 MG tablet Take 1 tablet (500 mg total) by mouth 3 (three) times daily. 02/16/15   Comer Locket, PA-C  mupirocin ointment (BACTROBAN) 2 % Place 1 application into the nose daily. For affected toe per patient    Historical Provider, MD  Omega-3 Fatty Acids (FISH OIL PO) Take 1,000 mg by mouth daily.     Historical Provider, MD  OVER THE COUNTER MEDICATION Take 1 tablet by mouth daily as needed. Chest congestion relief. Guaifenesin '400mg'$     Historical Provider, MD  oxycodone (OXY-IR) 5 MG capsule Take 2 capsules (10 mg total) by mouth every 4 (four) hours as needed for pain. Patient taking differently: Take 5 mg by mouth every  4 (four) hours as needed for pain.  10/24/13   Kinnie Feil, MD   BP 117/79 mmHg  Pulse 89  Temp(Src) 99.1 F (37.3 C) (Oral)  Resp 18  Ht 6' (1.829 m)  Wt 108.863 kg  BMI 32.54 kg/m2  SpO2 95% Physical Exam  Constitutional: He is oriented to person, place, and time. He appears well-developed and well-nourished.  Obese Caucasian male  HENT:  Head: Normocephalic and atraumatic.  Mouth/Throat: Oropharynx is clear and moist.  Eyes: Conjunctivae are normal. Pupils are equal, round, and reactive to light. Right eye exhibits no discharge. Left eye exhibits no discharge. No scleral icterus.  Neck: Neck supple.  Cardiovascular: Normal rate, regular rhythm and normal heart sounds.   Pulmonary/Chest: Effort normal and breath sounds normal. No respiratory distress. He has no wheezes. He has no rales.  Abdominal: Soft. There is tenderness.  Tenderness diffusely throughout lower abdomen without any focal tenderness. No pulsatile masses..  Musculoskeletal: He exhibits no tenderness.  Neurological: He is alert and oriented to person, place, and time.  Cranial Nerves II-XII grossly intact  Skin: Skin is warm and dry. No rash noted.  Psychiatric: He has a normal mood and affect.  Nursing note and vitals reviewed.   ED Course  Procedures  (including critical care time) Labs Review Labs Reviewed  CBC WITH DIFFERENTIAL/PLATELET - Abnormal; Notable for the following:    WBC 12.1 (*)    Platelets 124 (*)    Neutro Abs 9.5 (*)    Monocytes Absolute 1.3 (*)    All other components within normal limits  COMPREHENSIVE METABOLIC PANEL - Abnormal; Notable for the following:    Glucose, Bld 147 (*)    BUN 22 (*)    Calcium 8.5 (*)    Albumin 3.3 (*)    AST 44 (*)    All other components within normal limits  LIPASE, BLOOD  URINALYSIS, ROUTINE W REFLEX MICROSCOPIC (NOT AT [facility])    Imaging Review Ct Abdomen Pelvis W Contrast  02/16/2015  CLINICAL DATA:  Right lower quadrant abdominal pain with nausea starting last night. Hepatitis c. mild leukocytosis. EXAM: CT ABDOMEN AND PELVIS WITH CONTRAST TECHNIQUE: Multidetector CT imaging of the abdomen and pelvis was performed using the standard protocol following bolus administration of intravenous contrast. CONTRAST:  129m OMNIPAQUE IOHEXOL 300 MG/ML SOLN, 255mOMNIPAQUE IOHEXOL 300 MG/ML SOLN COMPARISON:  07/12/2013 FINDINGS: Lower chest: Calcified granulomas in the right lower lobe. Mild scarring or atelectasis in both lower lobes. 4 mm right middle lobe nodule on image 11 series 3 is not definitively calcified. Hepatobiliary: Punctate calcifications in the liver compatible with old granulomatous disease. Pancreas: Unremarkable Spleen: Numerous scattered tiny calcifications in the spleen likely from remote histoplasmosis/old granulomatous disease. Adrenals/Urinary Tract: 2 mm nonobstructive left mid kidney calculus, image 78 series 5. Adrenal glands normal. Stomach/Bowel: Abnormal 10 cm region of circumferential somewhat lobular wall thickening involving the cecum and ascending colon. Appendix normal. Equivocal wall thickening of the terminal ileum ; the ileocecal valve is mildly thickened. Distal to this, there is prominence of stool in the colon. Vascular/Lymphatic: Minimal atherosclerotic  calcification of the aortoiliac tree without observed narrowing of the celiac trunk, SMA, or IMA. 1.4 cm in short axis faintly rim calcified porta hepatis lymph node on image 21 series 2. Other faint calcified the porta hepatis lymph nodes are present but not enlarged. Reproductive: Unremarkable Other: No supplemental non-categorized findings. Musculoskeletal: Lumbar spondylosis and degenerative disc disease causing impingement at L3-4, L4-5, and L5-S1. IMPRESSION:  1. The dominant finding is prominent wall thickening involving the cecum and adjacent ascending colon over a 10 cm length. Probable involvement of the distal most terminal ileum. Differential diagnostic considerations include Crohn's disease, bacterial infectious colitis, and tuberculous colitis. Typhlitis seems unlikely given that the patient is not neutropenia currently. Lymphoma or tumor likewise considered less likely. Ischemic colitis is less likely given the region of involvement and lack of significant atherosclerosis. 2. 4 mm right middle lobe nodule, not definitively calcified. If the patient is at high risk for bronchogenic carcinoma, follow-up chest CT at 1 year is recommended. If the patient is at low risk, no follow-up is needed. This recommendation follows the consensus statement: Guidelines for Management of Small Pulmonary Nodules Detected on CT Scans: A Statement from the [place] as published in Radiology 2005; 237:395-400. 3. Old granulomatous disease. 4. There is some speckled calcifications in a mildly enlarged porta hepatis lymph node. This is most likely from old granulomatous disease. 5.  Prominent stool throughout the colon favors constipation. 6. Lower lumbar spondylosis and degenerative disc disease causing impingement at L3-4, L4-5, and L5-S1. 7. 2 mm nonobstructive left mid kidney calculus. Electronically Signed   By: Van Clines M.D.   On: 02/16/2015 17:02   I have personally reviewed and evaluated these  images and lab results as part of my medical decision-making.   EKG Interpretation None     Meds given in ED:  Medications  ciprofloxacin (CIPRO) tablet 500 mg (not administered)  metroNIDAZOLE (FLAGYL) tablet 500 mg (not administered)  HYDROmorphone (DILAUDID) injection 1 mg (1 mg Intravenous Given 02/16/15 1444)  iohexol (OMNIPAQUE) 300 MG/ML solution 25 mL (25 mLs Oral Contrast Given 02/16/15 1636)  iohexol (OMNIPAQUE) 300 MG/ML solution 100 mL (100 mLs Intravenous Contrast Given 02/16/15 1636)  HYDROmorphone (DILAUDID) injection 1 mg (1 mg Intravenous Given 02/16/15 1722)    New Prescriptions   CIPROFLOXACIN (CIPRO) 500 MG TABLET    Take 1 tablet (500 mg total) by mouth 2 (two) times daily.   METRONIDAZOLE (FLAGYL) 500 MG TABLET    Take 1 tablet (500 mg total) by mouth 3 (three) times daily.   Filed Vitals:   02/16/15 1311 02/16/15 1518 02/16/15 1723  BP: 124/83 122/71 117/79  Pulse: 105 83 89  Temp: 98.2 F (36.8 C)  99.1 F (37.3 C)  TempSrc: Oral  Oral  Resp: '20 18 18  '$ Height: 6' (1.829 m)    Weight: 108.863 kg    SpO2: 96% 97% 95%    MDM  William Solis is a 65 y.o. male with a history of hypertension and hep C, comes in for evaluation of abdominal pain. On arrival, patient is hemodynamically stable with a slight tachycardia-suspect due to pain, afebrile. He does have a diffusely tender abdomen with no focal tenderness. No peritoneal signs. Pain treated in the ED. Plan to obtain basic labs including lipase and urinalysis. There is a mild leukocytosis of 12.1, absolute neutrophils 9.5. Due to patient's tenderness, advanced age, we will obtain CT abdomen for further elucidation of patient's symptoms. CT shows diffuse wall thickening of the cecum and adjacent descending colon approximately 10 cm in length. Appendix is normal. Etiology includes concern for possible bacterial infectious colitis. We'll treat empirically with Cipro and Flagyl, with initial dose given in the ED.   Patient has a gastroenterologist and will be able to follow-up. Also reports he had a colonoscopy approximately one year ago which was normal.  Also discussed incidental finding of pulmonary nodule.  Patient does admit to cigarette smoking for 20 years, stopped in 1992. Will follow up with PCP for subsequent CT scan.  Patient reports he feels much better, overall appears well, nontoxic, hemodynamically stable and appropriate for discharge. Discussed return precautions. Patient and spouse at bedside agree with this plan and subsequent discharge.  Final diagnoses:  Abdominal discomfort  Colitis        Comer Locket, PA-C 02/16/15 [place], MD 02/16/15 1743

## 2015-02-16 NOTE — ED Notes (Signed)
Abdominal pain since yesterday that got worse over night. Hx of back pain so he did not think to much until this am the pain was worse.

## 2015-02-16 NOTE — ED Notes (Signed)
Patient done drinking the contrast for his CT, Nurse Con Memos notified.

## 2015-02-16 NOTE — Discharge Instructions (Signed)
You were evaluated in the ED today for your abdominal discomfort. Your CT scan showed evidence of a colitis which is inflammation of your intestine. You'll be treated for this with antibiotics. Please take your antibiotics as prescribed. Follow-up with your gastroenterologist for further evaluation and management of your symptoms. It is also important to follow up with your primary care doctor as we discussed for your pulmonary nodule. Return to ED for any new or worsening symptoms.  Colitis Colitis is inflammation of the colon. Colitis may last a short time (acute) or it may last a long time (chronic). CAUSES This condition may be caused by:  Viruses.  Bacteria.  Reactions to medicine.  Certain autoimmune diseases, such as Crohn disease or ulcerative colitis. SYMPTOMS Symptoms of this condition include:  Diarrhea.  Passing bloody or tarry stool.  Pain.  Fever.  Vomiting.  Tiredness (fatigue).  Weight loss.  Bloating.  Sudden increase in abdominal pain.  Having fewer bowel movements than usual. DIAGNOSIS This condition is diagnosed with a stool test or a blood test. You may also have other tests, including X-rays, a CT scan, or a colonoscopy. TREATMENT Treatment may include:  Resting the bowel. This involves not eating or drinking for a period of time.  Fluids that are given through an IV tube.  Medicine for pain and diarrhea.  Antibiotic medicines.  Cortisone medicines.  Surgery. HOME CARE INSTRUCTIONS Eating and Drinking  Follow instructions from your health care provider about eating or drinking restrictions.  Drink enough fluid to keep your urine clear or pale yellow.  Work with a dietitian to determine which foods cause your condition to flare up.  Avoid foods that cause flare-ups.  Eat a well-balanced diet. Medicines  Take over-the-counter and prescription medicines only as told by your health care provider.  If you were prescribed an  antibiotic medicine, take it as told by your health care provider. Do not stop taking the antibiotic even if you start to feel better. General Instructions  Keep all follow-up visits as told by your health care provider. This is important. SEEK MEDICAL CARE IF:  Your symptoms do not go away.  You develop new symptoms. SEEK IMMEDIATE MEDICAL CARE IF:  You have a fever that does not go away with treatment.  You develop chills.  You have extreme weakness, fainting, or dehydration.  You have repeated vomiting.  You develop severe pain in your abdomen.  You pass bloody or tarry stool.   This information is not intended to replace advice given to you by your health care provider. Make sure you discuss any questions you have with your health care provider.   Document Released: 02/14/2004 Document Revised: 09/27/2014 Document Reviewed: 05/01/2014 Elsevier Interactive Patient Education [employer].

## 2015-02-16 NOTE — ED Notes (Signed)
Given d/c instructions as per chart. Verbalizes understanding. No questions. Rx x 2 taken to pharmacy and picked up by pt.

## 2015-02-26 ENCOUNTER — Ambulatory Visit (INDEPENDENT_AMBULATORY_CARE_PROVIDER_SITE_OTHER): Payer: BLUE CROSS/BLUE SHIELD | Admitting: Podiatry

## 2015-02-26 ENCOUNTER — Encounter: Payer: Self-pay | Admitting: Podiatry

## 2015-02-26 DIAGNOSIS — M21962 Unspecified acquired deformity of left lower leg: Secondary | ICD-10-CM

## 2015-02-26 DIAGNOSIS — L97501 Non-pressure chronic ulcer of other part of unspecified foot limited to breakdown of skin: Secondary | ICD-10-CM

## 2015-02-26 DIAGNOSIS — L97509 Non-pressure chronic ulcer of other part of unspecified foot with unspecified severity: Secondary | ICD-10-CM | POA: Insufficient documentation

## 2015-02-26 NOTE — Progress Notes (Signed)
Subjective: 65 year old male presents with abnormal skin under the left foot. He noticed last week.  He has history of osteomyelitis and has neuropathy. S/P loss of 2nd ray, toe and metatarsal left. Had 3rd toe flexor tenotomy that helped him some.  Objective: Dark brown discolored skin with thick callus over under the 3rd MPJ area left foot. Severe 2nd and 3rd toe contracture left. Vascular status are within normal. Loss of protective sensory perception both feet.  Assessment: Peripheral neuropathy. Hx of Osteomyelitis. Hx of amputation 2nd ray left. Ulcer 3rd MPJ plantar left, limited to skin layer.  Plan: Reviewed clinical findings and available treatment options. All debrided and padded. Dressed with Amerigel. Instructed to cover with bandage.  Return in 2 weeks.

## 2015-02-26 NOTE — Patient Instructions (Signed)
Seen for left foot ulcer. Debrided and padded. Return in 2 weeks.

## 2015-03-12 ENCOUNTER — Ambulatory Visit (INDEPENDENT_AMBULATORY_CARE_PROVIDER_SITE_OTHER): Payer: BLUE CROSS/BLUE SHIELD | Admitting: Podiatry

## 2015-03-12 ENCOUNTER — Encounter: Payer: Self-pay | Admitting: Podiatry

## 2015-03-12 VITALS — BP 147/86 | HR 77

## 2015-03-12 DIAGNOSIS — M79675 Pain in left toe(s): Secondary | ICD-10-CM | POA: Diagnosis not present

## 2015-03-12 DIAGNOSIS — M21962 Unspecified acquired deformity of left lower leg: Secondary | ICD-10-CM | POA: Diagnosis not present

## 2015-03-12 DIAGNOSIS — L97501 Non-pressure chronic ulcer of other part of unspecified foot limited to breakdown of skin: Secondary | ICD-10-CM

## 2015-03-12 NOTE — Progress Notes (Signed)
Subjective: 65 year old male presents with follow up on pre ulcerative lesion under 3rd MPJ left foot. He has history of osteomyelitis and has neuropathy. S/P loss of 2nd ray, toe and metatarsal left. Had 3rd toe flexor tenotomy that helped him some.  Objective: Dark brown thick callus over under the 3rd MPJ area left foot. Severe 2nd and 3rd toe contracture left. Vascular status are within normal. Loss of protective sensory perception both feet.  Assessment: Peripheral neuropathy. Hx of Osteomyelitis. Hx of amputation 2nd ray left. Healing ulcer 3rd MPJ plantar left, limited to skin layer.  Plan: Reviewed clinical findings and available treatment options. All debrided. 1/4" felt pad added to shoe liner left foot.  Instructed to inspect both feet with mirror daily. Return with any changes.

## 2015-03-12 NOTE — Patient Instructions (Signed)
Follow up on left foot ulcer. Doing well. Pad placed in shoe liner. Return as needed.

## 2015-04-09 DIAGNOSIS — R911 Solitary pulmonary nodule: Secondary | ICD-10-CM | POA: Insufficient documentation

## 2015-05-21 ENCOUNTER — Encounter: Payer: Self-pay | Admitting: Podiatry

## 2015-05-21 ENCOUNTER — Ambulatory Visit (INDEPENDENT_AMBULATORY_CARE_PROVIDER_SITE_OTHER): Payer: BLUE CROSS/BLUE SHIELD | Admitting: Podiatry

## 2015-05-21 VITALS — BP 129/73 | HR 78

## 2015-05-21 DIAGNOSIS — G5793 Unspecified mononeuropathy of bilateral lower limbs: Secondary | ICD-10-CM

## 2015-05-21 DIAGNOSIS — L97501 Non-pressure chronic ulcer of other part of unspecified foot limited to breakdown of skin: Secondary | ICD-10-CM

## 2015-05-21 DIAGNOSIS — M21962 Unspecified acquired deformity of left lower leg: Secondary | ICD-10-CM | POA: Diagnosis not present

## 2015-05-21 NOTE — Patient Instructions (Signed)
Seen for ulcerating callus with blister formation. Debrided, drained and dressed. Keep the dressing for 2 days. Cover with band aid afterward. Return in 2 weeks.

## 2015-05-21 NOTE — Progress Notes (Signed)
Sjective: 65 year old male presents complaining of a lesion under left foot x 1 week. Also been hurting under 5th metatarsal base for 2 years.  He has history of osteomyelitis and has neuropathy. S/P loss of 2nd ray, toe and metatarsal left. Had 3rd toe flexor tenotomy that helped him some. Sits at work.   Objective: Blistered callus over under the 3rd MPJ area left foot with fluid extending from MPJ to sulcus area associated mild erythema. Severe 2nd and 3rd toe contracture left. Vascular status are within normal. Loss of protective sensory perception both feet.  Assessment: Peripheral neuropathy following back surgery 11 years ago. Hx of Osteomyelitis. Hx of amputation 2nd ray left. Ulcer 3rd MPJ plantar left with expanding blister to distal sulcus area.   Plan: Reviewed clinical findings and available treatment options. Debrided, drained, cleansed with Iodine.  1/4" aperture pad around sub 3 lesion with Amerigel dressing.  Instructed to keep the dressing for 2 days then change the dressing.

## 2015-06-04 ENCOUNTER — Encounter: Payer: Self-pay | Admitting: Podiatry

## 2015-06-04 ENCOUNTER — Ambulatory Visit (INDEPENDENT_AMBULATORY_CARE_PROVIDER_SITE_OTHER): Payer: BLUE CROSS/BLUE SHIELD | Admitting: Podiatry

## 2015-06-04 VITALS — BP 128/76 | HR 76

## 2015-06-04 DIAGNOSIS — M21962 Unspecified acquired deformity of left lower leg: Secondary | ICD-10-CM

## 2015-06-04 DIAGNOSIS — G5793 Unspecified mononeuropathy of bilateral lower limbs: Secondary | ICD-10-CM

## 2015-06-04 DIAGNOSIS — L97501 Non-pressure chronic ulcer of other part of unspecified foot limited to breakdown of skin: Secondary | ICD-10-CM | POA: Diagnosis not present

## 2015-06-04 NOTE — Progress Notes (Signed)
Sjective: 65 year old male presents for follow up on left foot ulcer.   He has history of osteomyelitis and has neuropathy. S/P loss of 2nd ray, toe and metatarsal left. Had 3rd toe flexor tenotomy that helped him some. Sits at work.   Objective: Blistered callus is dry with small opening (63m) under the 3rd MPJ area left foot, limited to breakdown of skin.  No edema or associated erythema noted. Previous blistered distal sulcus area is dry.  Severe 2nd and 3rd toe contracture left. Vascular status are within normal. Loss of protective sensory perception both feet.  Assessment: Peripheral neuropathy following back surgery 11 years ago. Hx of Osteomyelitis. Hx of amputation 2nd ray left. Ulcer 3rd MPJ plantar left with 3 mm opening without erythema and has minimum drainage.    Plan: Area debrided and cleansed with Iodine.  1/4" aperture pad around sub 3 lesion with Amerigel dressing.  Instructed to keep the dressing dry.  Return in 2 weeks.

## 2015-06-04 NOTE — Patient Instructions (Signed)
Seen for left foot lesion. Ulcer still open. Padded and dressed. Keep the pad dry. Return in 2 weeks.

## 2015-06-19 ENCOUNTER — Ambulatory Visit (INDEPENDENT_AMBULATORY_CARE_PROVIDER_SITE_OTHER): Payer: BLUE CROSS/BLUE SHIELD | Admitting: Podiatry

## 2015-06-19 ENCOUNTER — Encounter: Payer: Self-pay | Admitting: Podiatry

## 2015-06-19 VITALS — BP 150/91 | HR 91

## 2015-06-19 DIAGNOSIS — L97501 Non-pressure chronic ulcer of other part of unspecified foot limited to breakdown of skin: Secondary | ICD-10-CM | POA: Diagnosis not present

## 2015-06-19 DIAGNOSIS — M21962 Unspecified acquired deformity of left lower leg: Secondary | ICD-10-CM | POA: Diagnosis not present

## 2015-06-19 DIAGNOSIS — G5793 Unspecified mononeuropathy of bilateral lower limbs: Secondary | ICD-10-CM

## 2015-06-19 NOTE — Patient Instructions (Signed)
Left foot ulcer debrided and padded. Removable met pad dispensed. Return in 2 weeks.

## 2015-06-19 NOTE — Progress Notes (Signed)
Sjective: 65 year old male presents for follow up on left foot ulcer.  Was able to keep pad for only 2 days.  He has history of osteomyelitis and has neuropathy. S/P loss of 2nd ray, toe and metatarsal left. Had 3rd toe flexor tenotomy that helped him some. Sits at work.   Objective: Bleeding callus with open base 3 mm under the 3rd MPJ area left foot, limited to breakdown of skin.  No edema or associated erythema noted. Severe 2nd and 3rd toe contracture left. Vascular status are within normal. Loss of protective sensory perception both feet.  Assessment: Peripheral neuropathy following back surgery 11 years ago. Hx of Osteomyelitis. Hx of amputation 2nd ray left. Ulcer 3rd MPJ plantar left with 3 mm opening without erythema and has minimum drainage.   Plan: Area debrided and cleansed with 50% H2O2. Lesion dressed with Amerigel dressing.  Removable met pad dispensed. Return in 2 weeks.

## 2015-07-03 ENCOUNTER — Ambulatory Visit (INDEPENDENT_AMBULATORY_CARE_PROVIDER_SITE_OTHER): Payer: BLUE CROSS/BLUE SHIELD | Admitting: Podiatry

## 2015-07-03 ENCOUNTER — Encounter: Payer: Self-pay | Admitting: Podiatry

## 2015-07-03 VITALS — BP 150/92 | HR 82

## 2015-07-03 DIAGNOSIS — G5793 Unspecified mononeuropathy of bilateral lower limbs: Secondary | ICD-10-CM | POA: Diagnosis not present

## 2015-07-03 DIAGNOSIS — L97501 Non-pressure chronic ulcer of other part of unspecified foot limited to breakdown of skin: Secondary | ICD-10-CM

## 2015-07-03 DIAGNOSIS — M21962 Unspecified acquired deformity of left lower leg: Secondary | ICD-10-CM

## 2015-07-03 DIAGNOSIS — M2042 Other hammer toe(s) (acquired), left foot: Secondary | ICD-10-CM | POA: Diagnosis not present

## 2015-07-03 NOTE — Progress Notes (Signed)
Sjective: 65 year old male presents for follow up on left foot ulcer.  Was not successful using removable pad for it was moving away.   He has history of osteomyelitis and has neuropathy. S/P loss of 2nd ray, toe and metatarsal left. Had 3rd toe flexor tenotomy that helped him some. Sits at work.   Objective: Bleeding callus with open base 3 mm under the 3rd MPJ area left foot, limited to breakdown of skin with decrease in size to 0.2 cm.  No edema or associated erythema noted. Severe 2nd and 3rd toe contracture left. Vascular status are within normal. Loss of protective sensory perception both feet.  Assessment: Peripheral neuropathy following back surgery 11 years ago. Hx of Osteomyelitis. Hx of amputation 2nd ray left. Ulcer 3rd MPJ plantar left with 2 mm opening without erythema and has minimum drainage.   Plan: Area debrided and cleansed with 50% H2O2. Lesion dressed with Amerigel dressing.  Modified removable met pad dispensed. Return in 2 weeks.

## 2015-07-03 NOTE — Patient Instructions (Signed)
Follow up on left foot ulcer. Slow healing. Continue with off loading pad. Return in 2 weeks.

## 2015-07-18 ENCOUNTER — Ambulatory Visit (INDEPENDENT_AMBULATORY_CARE_PROVIDER_SITE_OTHER): Payer: BLUE CROSS/BLUE SHIELD | Admitting: Podiatry

## 2015-07-18 ENCOUNTER — Encounter: Payer: Self-pay | Admitting: Podiatry

## 2015-07-18 VITALS — BP 153/90 | HR 77

## 2015-07-18 DIAGNOSIS — L97501 Non-pressure chronic ulcer of other part of unspecified foot limited to breakdown of skin: Secondary | ICD-10-CM | POA: Diagnosis not present

## 2015-07-18 DIAGNOSIS — M2040 Other hammer toe(s) (acquired), unspecified foot: Secondary | ICD-10-CM

## 2015-07-18 DIAGNOSIS — M2042 Other hammer toe(s) (acquired), left foot: Secondary | ICD-10-CM

## 2015-07-18 DIAGNOSIS — G5793 Unspecified mononeuropathy of bilateral lower limbs: Secondary | ICD-10-CM

## 2015-07-18 DIAGNOSIS — M216X9 Other acquired deformities of unspecified foot: Secondary | ICD-10-CM | POA: Diagnosis not present

## 2015-07-18 DIAGNOSIS — M21962 Unspecified acquired deformity of left lower leg: Secondary | ICD-10-CM

## 2015-07-18 NOTE — Patient Instructions (Signed)
Follow up on left foot ulcer. Slow healing in progress. Keep pad as instructed. Both feet casted for orthotics. Return in 2 weeks.

## 2015-07-18 NOTE — Progress Notes (Signed)
Sjective: 65 year old male presents accompanied by his wife for follow up on left foot ulcer.  Removable pad was doing better this time.   He has history of osteomyelitis and has neuropathy. S/P loss of 2nd ray, toe and metatarsal left. Had 3rd toe flexor tenotomy that helped him some. Sits at work.   Objective: History of 2nd ray resection left due to none healing ulcer.  Bleeding callus is limited to skin without open skin covering about 1 cm in diameter under the 3rd MPJ area left foot. No edema or associated erythema noted. Severe 2nd and 3rd toe contracture left. Vascular status are within normal. Loss of protective sensory perception both feet.  Assessment: Peripheral neuropathy following back surgery 11 years ago. Hx of Osteomyelitis. Hx of amputation 2nd ray left. Ulcer 3rd MPJ plantar left with 2 mm opening without erythema and has minimum drainage.   Plan: Reviewed clinical findings and available treatment options, orthotics and palliative care. Area debrided and cleansed with 50% H2O2. Lesion dressed with Amerigel dressing.  Modified removable met pad dispensed. Both feet casted for orthotics. Return in 2 weeks.

## 2015-08-01 ENCOUNTER — Ambulatory Visit (INDEPENDENT_AMBULATORY_CARE_PROVIDER_SITE_OTHER): Payer: BLUE CROSS/BLUE SHIELD | Admitting: Podiatry

## 2015-08-01 ENCOUNTER — Encounter: Payer: Self-pay | Admitting: Podiatry

## 2015-08-01 DIAGNOSIS — L97501 Non-pressure chronic ulcer of other part of unspecified foot limited to breakdown of skin: Secondary | ICD-10-CM | POA: Diagnosis not present

## 2015-08-01 DIAGNOSIS — M21962 Unspecified acquired deformity of left lower leg: Secondary | ICD-10-CM | POA: Diagnosis not present

## 2015-08-01 DIAGNOSIS — G5793 Unspecified mononeuropathy of bilateral lower limbs: Secondary | ICD-10-CM

## 2015-08-01 NOTE — Patient Instructions (Signed)
Healing ulcer in progress. Continue with pad and minimum ambulation. Return in 2 weeks.

## 2015-08-01 NOTE — Progress Notes (Signed)
Sjective: 65 year old male presents for follow up on left foot ulcer.  Removable pad was doing good.  He has history of osteomyelitis and has neuropathy. S/P loss of 2nd ray, toe and metatarsal left. Had 3rd toe flexor tenotomy that helped him some. Sits at work.   Objective: History of 2nd ray resection left due to none healing ulcer.  Bleeding callus is limited to skin without open skin covering about 1 cm in diameter under the 3rd MPJ area left foot. No edema or associated erythema noted. Severe 2nd and 3rd toe contracture left. Vascular status are within normal. Loss of protective sensory perception both feet.  Assessment: Peripheral neuropathy following back surgery 11 years ago. Hx of Osteomyelitis. Hx of amputation 2nd ray left. Ulcer 3rd MPJ plantar left with 2 mm opening without erythema and has dry base.   Plan: Reviewed clinical findings and available treatment options, orthotics and palliative care. Area debrided and cleansed with 50% H2O2. Lesion dressed with Amerigel dressing.  1/4" Metatarsal pad applied with a spar supply.  Return in 2 weeks.

## 2015-08-15 ENCOUNTER — Encounter: Payer: Self-pay | Admitting: Podiatry

## 2015-08-15 ENCOUNTER — Ambulatory Visit (INDEPENDENT_AMBULATORY_CARE_PROVIDER_SITE_OTHER): Payer: BLUE CROSS/BLUE SHIELD | Admitting: Podiatry

## 2015-08-15 DIAGNOSIS — G5793 Unspecified mononeuropathy of bilateral lower limbs: Secondary | ICD-10-CM

## 2015-08-15 DIAGNOSIS — M21962 Unspecified acquired deformity of left lower leg: Secondary | ICD-10-CM

## 2015-08-15 DIAGNOSIS — L97501 Non-pressure chronic ulcer of other part of unspecified foot limited to breakdown of skin: Secondary | ICD-10-CM | POA: Diagnosis not present

## 2015-08-15 NOTE — Progress Notes (Signed)
Subjective: 65 year old male presents for follow up on left foot ulcer.   He has history of osteomyelitis and has neuropathy. S/P loss of 2nd ray, toe and metatarsal left. Had 3rd toe flexor tenotomy that helped him some. Sits at work.   Objective: History of 2nd ray resection left due to none healing ulcer.  Bleeding callus is limited to skin without open skin covering about 0.5 cm in diameter under the 3rd MPJ area left foot. No edema or associated erythema noted. Severe 2nd and 3rd toe contracture left. Vascular status are within normal. Loss of protective sensory perception both feet.  Assessment: Peripheral neuropathy following back surgery 11 years ago. Hx of Osteomyelitis. Hx of amputation 2nd ray left. Ulcer 3rd MPJ plantar left with 2 mm opening without erythema and has dry base.   Plan: Reviewed clinical findings and available treatment options, orthotics and palliative care. Lesion dressed with Amerigel dressing.  1/4" Metatarsal pad applied with a spar supply.  Return in 2 weeks.

## 2015-08-15 NOTE — Patient Instructions (Signed)
Slow healing left foot lesion. Clean and mostly dry base with focal redness. Padding applied with home care supply. Return in 2 weeks.

## 2015-08-29 ENCOUNTER — Ambulatory Visit (INDEPENDENT_AMBULATORY_CARE_PROVIDER_SITE_OTHER): Payer: BLUE CROSS/BLUE SHIELD | Admitting: Podiatry

## 2015-08-29 ENCOUNTER — Encounter: Payer: Self-pay | Admitting: Podiatry

## 2015-08-29 DIAGNOSIS — L97501 Non-pressure chronic ulcer of other part of unspecified foot limited to breakdown of skin: Secondary | ICD-10-CM | POA: Diagnosis not present

## 2015-08-29 DIAGNOSIS — G5793 Unspecified mononeuropathy of bilateral lower limbs: Secondary | ICD-10-CM

## 2015-08-29 DIAGNOSIS — M21962 Unspecified acquired deformity of left lower leg: Secondary | ICD-10-CM | POA: Diagnosis not present

## 2015-08-29 NOTE — Patient Instructions (Signed)
Seen for ulcerating callus left foot. Lesion debrided and padded. Orthotic dispensed. Return in 3 weeks.

## 2015-08-29 NOTE — Progress Notes (Signed)
Subjective: 65 year old male presents for follow up on left foot ulcer.   He has history of osteomyelitis and has neuropathy. S/P loss of 2nd ray, toe and metatarsal left. Had 3rd toe flexor tenotomy that helped him some. Sits at work.   Objective: History of 2nd ray resection left due to none healing ulcer.  Bleeding callus is limited to skin without open skin covering about 0.5 cm in diameter under the 3rd MPJ area left foot. No edema or associated erythema noted. Severe 2nd and 3rd toe contracture left. Vascular status are within normal. Loss of protective sensory perception both feet.  Assessment: Peripheral neuropathy following back surgery 11 years ago. Hx of Osteomyelitis. Hx of amputation 2nd ray left. Ulcer 3rd MPJ plantar left with 2 mm opening without erythema and has dry base.   Plan: Reviewed clinical findings and available treatment options, orthotics and palliative care. Lesion dressed with Amerigel dressing.  1/4" Metatarsal pad applied and spare pad dispensed. Orthotics dispensed with instruction.  Return in 3 weeks.

## 2015-09-19 ENCOUNTER — Ambulatory Visit: Payer: BLUE CROSS/BLUE SHIELD | Admitting: Podiatry

## 2015-09-26 ENCOUNTER — Encounter: Payer: Self-pay | Admitting: Podiatry

## 2015-09-26 ENCOUNTER — Ambulatory Visit (INDEPENDENT_AMBULATORY_CARE_PROVIDER_SITE_OTHER): Payer: BLUE CROSS/BLUE SHIELD | Admitting: Podiatry

## 2015-09-26 DIAGNOSIS — L97501 Non-pressure chronic ulcer of other part of unspecified foot limited to breakdown of skin: Secondary | ICD-10-CM

## 2015-09-26 DIAGNOSIS — M21962 Unspecified acquired deformity of left lower leg: Secondary | ICD-10-CM

## 2015-09-26 DIAGNOSIS — G5793 Unspecified mononeuropathy of bilateral lower limbs: Secondary | ICD-10-CM

## 2015-09-26 NOTE — Progress Notes (Signed)
Subjective: 65 year old male presents for follow up on left foot ulcer.  Started hurting a couple of days ago and place a band aid.   He has history of osteomyelitis and has neuropathy. S/P loss of 2nd ray, toe and metatarsal left. Had 3rd toe flexor tenotomy that helped him some. Sits at work.   Objective: History of 2nd ray resection left due to none healing ulcer.  Bleeding callus is limited to skin without open skin covering about 0.5cm in diameter under the 3rd MPJ area left foot. No edema or associated erythema noted. Severe 2nd and 3rd toe contracture left. Vascular status are within normal. Loss of protective sensory perception both feet.  Assessment: Peripheral neuropathy following back surgery 11 years ago. Hx of Osteomyelitis. Hx of amputation 2nd ray left. Ulcer 3rd MPJ plantar left with 2 mm opening without erythema and has dry base.   Plan: Reviewed clinical findings and available treatment options, orthotics and palliative care. Lesion dressed with Amerigel dressing.  1/4" Metatarsal pad applied and spare pad dispensed. Orthotics dispensed with instruction.  Return in 3 weeks.+

## 2015-09-26 NOTE — Patient Instructions (Signed)
Left foot lesion debrided and padded. Return in one week.

## 2015-10-12 DIAGNOSIS — R600 Localized edema: Secondary | ICD-10-CM | POA: Insufficient documentation

## 2015-10-24 ENCOUNTER — Encounter: Payer: Self-pay | Admitting: Podiatry

## 2015-10-24 ENCOUNTER — Ambulatory Visit (INDEPENDENT_AMBULATORY_CARE_PROVIDER_SITE_OTHER): Payer: BLUE CROSS/BLUE SHIELD | Admitting: Podiatry

## 2015-10-24 DIAGNOSIS — L97512 Non-pressure chronic ulcer of other part of right foot with fat layer exposed: Secondary | ICD-10-CM | POA: Diagnosis not present

## 2015-10-24 DIAGNOSIS — G5793 Unspecified mononeuropathy of bilateral lower limbs: Secondary | ICD-10-CM

## 2015-10-24 NOTE — Patient Instructions (Signed)
Developed new ulcer right foot under 2nd MPJ area about 1 cm in diameter opening. Debrided and padded. Keep the pad intact and change outer dressing daily. Stay off of feet as much as possible. Return in 5 days.

## 2015-10-24 NOTE — Progress Notes (Signed)
Subjective: 65 year old male presents for follow up on left foot ulcer. Stated that he noted a small cut about a week and a half ago on right foot. Now it has gotten bigger. He is going out of town tomorrow.   HPI: He has history of osteomyelitis and has neuropathy. S/P loss of 2nd ray, toe and metatarsal left. Had 3rd toe flexor tenotomy that helped him some. Recurring and slow healing ulcer under 3rd MPJ left foot. Sits at work.   Objective: S/P Loss of 2nd ray following none healing ulcer 2016.  New lesion under 2nd MPJ right with loss of subdermal layer, deep to fat layer, 1 cm opening, serosanguinous drainage. Bleeding callus with history of frequent skin breakdown under the 3rd MPJ area left foot. No edema or associated erythema noted. Severe 2nd and 3rd toe contracture left. Vascular status are within normal. Loss of protective sensory perception both feet.  Assessment: Peripheral neuropathy following back surgery 11 years ago. Hx of Osteomyelitis. Hx of amputation 2nd ray left. New lesion under 2nd MPJ right with open ulcer, deep down to fat layer, 1 cm in diameter. Healed ulcer 3rd MPJ plantar left with residual callus.  Plan: Reviewed clinical findings and available treatment options. Advised to stay home and maintain non weight bearing on right foot. Patient is scheduled to leave town tomorrow. Right foot lesion debrided all devitalized tissue. Area cleansed with 50% H2O2. Aperture pad placed. Outer dressing applied with Coban bandage with home care instruction.  Reviewed high risk of infection and aggravation during the trip he is having for the next few days. Advised to stay off of feet as much as possible. Do daily wound care. Left foot old ulcer site debrided all build up keratotic tissue. Return as soon as possible after the trip.

## 2015-10-31 ENCOUNTER — Ambulatory Visit (INDEPENDENT_AMBULATORY_CARE_PROVIDER_SITE_OTHER): Payer: BLUE CROSS/BLUE SHIELD | Admitting: Podiatry

## 2015-10-31 ENCOUNTER — Encounter: Payer: Self-pay | Admitting: Podiatry

## 2015-10-31 DIAGNOSIS — L97512 Non-pressure chronic ulcer of other part of right foot with fat layer exposed: Secondary | ICD-10-CM | POA: Diagnosis not present

## 2015-10-31 DIAGNOSIS — G5793 Unspecified mononeuropathy of bilateral lower limbs: Secondary | ICD-10-CM | POA: Diagnosis not present

## 2015-10-31 DIAGNOSIS — M21962 Unspecified acquired deformity of left lower leg: Secondary | ICD-10-CM

## 2015-10-31 NOTE — Patient Instructions (Addendum)
Follow up on right foot ulcer. Doing better. Keep the pad and do daily dressing change. Return in one week.

## 2015-10-31 NOTE — Progress Notes (Signed)
Subjective: 65 year old male presents for follow up on right foot ulcer.  He cancelled his trip to out of town. He is using removable pad under the right foot ulcer.   HPI: New open lesion under right foot 2nd MPJ area since 4th week of September. He has history of osteomyelitis. Diagnosed with peripheral neuropathy. S/P loss of 2nd ray, toe and metatarsal left. Had 3rd toe flexor tenotomy left foot that helped him some. Recurring and slow healing ulcer under 3rd MPJ left foot resolved. Sits at work.   Objective: S/P Loss of 2nd ray following none healing ulcer 4178.  36 week old new lesion under 2nd MPJ right with loss of subdermal layer decreased size in opening. Now about 0.4 cm opening.  Clean base with no active drainage noted. Bleeding callus with history of frequent skin breakdown under the 3rd MPJ area left foot. No edema or associated erythema noted bilateral. Severe 2nd and 3rd toe contracture left. Vascular status are within normal. Loss of protective sensory perception both feet.  Assessment: Peripheral neuropathy following back surgery 11 years ago. Hx of Osteomyelitis with amputation 2nd ray left. New lesion under 2nd MPJ right with open ulcer, healing in progress. Size decreased to 0.4 cm in diameter.  Healed ulcer 3rd MPJ plantar left with residual callus.  Plan: Continue to stay off of feet if possible. Tx 1. Right foot lesion debrided all devitalized tissue.  Aperture pad placed under both feet. Outer dressing applied with Amerigel ointment on right.  Keep the padding and do daily dressing change on right foot.  Tx 2. Left foot old ulcer site debrided and padding applied.  Return in one week.

## 2015-11-07 ENCOUNTER — Ambulatory Visit: Payer: BLUE CROSS/BLUE SHIELD | Admitting: Podiatry

## 2015-11-07 DIAGNOSIS — G4733 Obstructive sleep apnea (adult) (pediatric): Secondary | ICD-10-CM | POA: Insufficient documentation

## 2015-11-07 DIAGNOSIS — Z9989 Dependence on other enabling machines and devices: Secondary | ICD-10-CM

## 2015-11-08 ENCOUNTER — Ambulatory Visit (INDEPENDENT_AMBULATORY_CARE_PROVIDER_SITE_OTHER): Payer: BLUE CROSS/BLUE SHIELD | Admitting: Podiatry

## 2015-11-08 ENCOUNTER — Encounter: Payer: Self-pay | Admitting: Podiatry

## 2015-11-08 DIAGNOSIS — L97512 Non-pressure chronic ulcer of other part of right foot with fat layer exposed: Secondary | ICD-10-CM | POA: Diagnosis not present

## 2015-11-08 DIAGNOSIS — G5793 Unspecified mononeuropathy of bilateral lower limbs: Secondary | ICD-10-CM

## 2015-11-08 NOTE — Progress Notes (Signed)
Subjective: 65 year old male presents for follow up on right foot ulcer. His padding got wet and only kept for 3 days.   HPI: New open lesion under right foot 2nd MPJ area since 4th week of September. He has history of osteomyelitis. Diagnosed with peripheral neuropathy. S/P loss of 2nd ray, toe and metatarsal left. Had 3rd toe flexor tenotomy left foot that helped him some. Recurring and slow healing ulcer under 3rd MPJ left foot resolved. Sits at work.   Objective: S/P Loss of 2nd ray followingnone healing ulcer 11069.  72 week old new lesion under 2nd MPJ right with loss of subdermal layer show no change since last visit. Opening is still 0.4 cm in diameter..  Clean base with no active drainage noted. Bleeding callus with history of frequent skin breakdown under the 3rd MPJ area left foot, same without active opening. No edema or associated erythema noted bilateral. Severe 2nd and 3rd toe contracture left. Vascular status are within normal. Loss of protective sensory perception both feet.  Assessment: Peripheral neuropathy following back surgery 11 years ago. Hx of Osteomyelitis with amputation 2nd ray left. New lesion under 2nd MPJ right with open ulcer, healing in progress. Slowing down on healing. 0.4 cm in diameter same as the last visit. Possible due to short life span of plantar padding (3 days).  Healed ulcer 3rd MPJ plantar left with residual callus.  Plan: Continue to stay off of feet if possible. Tx 1. Right foot lesion debrided all devitalized tissue.  Aperture pad placed under both feet. Outer dressing applied with Amerigel ointment on right.  Keep the padding and do daily dressing change on right foot.  Tx 2. Left foot old ulcer site padding applied.  Extra padding dispensed to change to change in mid week on right foot ulcer. Return in one week.

## 2015-11-08 NOTE — Patient Instructions (Signed)
Seen for slow healing right foot ulcer. Need to keep it off weight bearing. Use spare pad. Minimize weight bearing. Return in one week.

## 2015-11-15 ENCOUNTER — Encounter: Payer: Self-pay | Admitting: Podiatry

## 2015-11-15 ENCOUNTER — Ambulatory Visit (INDEPENDENT_AMBULATORY_CARE_PROVIDER_SITE_OTHER): Payer: BLUE CROSS/BLUE SHIELD | Admitting: Podiatry

## 2015-11-15 DIAGNOSIS — G5793 Unspecified mononeuropathy of bilateral lower limbs: Secondary | ICD-10-CM

## 2015-11-15 DIAGNOSIS — M21962 Unspecified acquired deformity of left lower leg: Secondary | ICD-10-CM | POA: Diagnosis not present

## 2015-11-15 DIAGNOSIS — L97512 Non-pressure chronic ulcer of other part of right foot with fat layer exposed: Secondary | ICD-10-CM | POA: Diagnosis not present

## 2015-11-15 NOTE — Patient Instructions (Addendum)
Seen for follow up on right foot ulcer. Right foot ulcer debrided and padded. Return in one week.

## 2015-11-15 NOTE — Progress Notes (Signed)
Subjective: 65year old male presents for follow up on right foot ulcer. He was able to keep the pad for 4 days. He re used the pad afterward.   HPI: New open lesion under right foot 2nd MPJ area since 4th week of September. He has history of osteomyelitis. Diagnosed with peripheral neuropathy. S/P loss of 2nd ray, toe and metatarsal left. Had 3rd toe flexor tenotomy left foot that helped him some. Recurring and slow healing ulcer under 3rd MPJ left foot resolved. Sits at work.   Objective: 61 week old new lesion under 2nd MPJ right with loss of subdermal layer show no change since last visit. Opening is closing in, 0.3 cm in diameter..  Clean base with no active drainage noted. Bleeding callus with history of frequent skin breakdown under the 3rd MPJ area left foot, same without active opening. No edema or associated erythema noted bilateral. Severe 2nd and 3rd toe contracture left. Vascular status are within normal. Loss of protective sensory perception both feet.  Assessment: Peripheral neuropathy following back surgery 11 years ago. Hx of Osteomyelitis with amputation 2nd ray left. New lesion under 2nd MPJ right with open ulcer, healing in progress.  Slow progressing with decreased diameter to 0.3 cm in diameterhe last visit. Healed ulcer 3rd MPJ plantar left with residual callus and intradermal bleeding..  Plan: Continue to stay off of feet if possible. Tx 1. Right foot lesion debrided all devitalized tissue.  Aperture pad placed under both feet. Outer dressing applied with Amerigel ointment on right.  Keep the padding and do daily dressing change on right foot.  Tx 2. Left foot old ulcer site debride. Return in one week

## 2015-11-22 ENCOUNTER — Ambulatory Visit (INDEPENDENT_AMBULATORY_CARE_PROVIDER_SITE_OTHER): Payer: BLUE CROSS/BLUE SHIELD | Admitting: Podiatry

## 2015-11-22 ENCOUNTER — Encounter: Payer: Self-pay | Admitting: Podiatry

## 2015-11-22 DIAGNOSIS — M21962 Unspecified acquired deformity of left lower leg: Secondary | ICD-10-CM | POA: Diagnosis not present

## 2015-11-22 DIAGNOSIS — L97512 Non-pressure chronic ulcer of other part of right foot with fat layer exposed: Secondary | ICD-10-CM | POA: Diagnosis not present

## 2015-11-22 DIAGNOSIS — G5793 Unspecified mononeuropathy of bilateral lower limbs: Secondary | ICD-10-CM | POA: Diagnosis not present

## 2015-11-22 NOTE — Patient Instructions (Signed)
Slow improving right foot ulcer. Continue with daily wound care and take load off of the foot. Return in 2 weeks.

## 2015-11-22 NOTE — Progress Notes (Signed)
Subjective: 65year old male presents accompanied by his wife for follow up on right foot ulcer.  He was able to re use the pad that was placed on the foot.   HPI: New open lesion under right foot 2nd MPJ area since 4th week of September. He has history of osteomyelitis. Diagnosed with peripheral neuropathy. S/P loss of 2nd ray, toe and metatarsal left. Had 3rd toe flexor tenotomy left foot that helped him some. Recurring and slow healing ulcer under 3rd MPJ left foot resolved. Sits at work.   Objective: 89week old new lesion under 2nd MPJ right with loss of subdermal layer show no change since last visit. Opening is closing in, 0.25 cm in diameter. Clean base with no active drainage noted. Bleeding callus with history of frequent skin breakdown under the 3rd MPJ area left foot, same without active opening. No edema or associated erythema noted bilateral. Severe 2nd and 3rd toe contracture left. Vascular status are within normal. Loss of protective sensory perception both feet.  Assessment: Peripheral neuropathy following back surgery 11 years ago. Hx of Osteomyelitis with amputation 2nd ray left. New lesion under 2nd MPJ right with open ulcer, healing in progress.  Slow progressing with decreased diameter to 0.3 cm in diameterhe last visit. Healed ulcer 3rd MPJ plantar left with residual callus and intradermal bleeding..  Plan: Continue to stay off of feet if possible. Tx 1. Right foot lesion debrided all devitalized tissue.  Aperture pad placed. Outer dressing applied with Amerigel ointment on right.  Keep the padding and do daily dressing change on right foot.  Extra padding dispensed.  Tx 2. Left foot old ulcer site debride. 1/8" Felt pad added to plantar surface of right orthotics to reduce pressure to the ulcer site. Return in 2 weeks.

## 2015-12-06 ENCOUNTER — Ambulatory Visit (INDEPENDENT_AMBULATORY_CARE_PROVIDER_SITE_OTHER): Payer: BLUE CROSS/BLUE SHIELD | Admitting: Podiatry

## 2015-12-06 ENCOUNTER — Encounter: Payer: Self-pay | Admitting: Podiatry

## 2015-12-06 VITALS — BP 147/91 | HR 79

## 2015-12-06 DIAGNOSIS — L97512 Non-pressure chronic ulcer of other part of right foot with fat layer exposed: Secondary | ICD-10-CM | POA: Diagnosis not present

## 2015-12-06 DIAGNOSIS — M21962 Unspecified acquired deformity of left lower leg: Secondary | ICD-10-CM

## 2015-12-06 DIAGNOSIS — G5793 Unspecified mononeuropathy of bilateral lower limbs: Secondary | ICD-10-CM | POA: Diagnosis not present

## 2015-12-06 NOTE — Patient Instructions (Signed)
Slow healing right foot ulcer. Debrided and padded. Replacement pad dispensed x 3.  Return in 3 weeks.

## 2015-12-07 NOTE — Progress Notes (Signed)
Subjective: 66year old male presents for follow up on right foot ulcer.  He was keep the pad under the foot as instructed.    HPI: New open lesion under right foot 2nd MPJ area since 4th week of September. He has history of osteomyelitis. Diagnosed with peripheral neuropathy. S/P loss of 2nd ray, toe and metatarsal left. Had 3rd toe flexor tenotomy left foot that helped him some. Recurring and slow healing ulcer under 3rd MPJ left foot resolved. Sits at work.   Objective: 40week old new lesion under 2nd MPJ right with loss of subdermal layer showing slow improvement. Opening is closing in, 0.2 x 0.5cm in diameter. Clean base with no active drainage noted. Bleeding callus with history of frequent skin breakdown under the 3rd MPJ area left foot, same without active opening. No edema or associated erythema noted bilateral. Severe 2nd and 3rd toe contracture left. Vascular status are within normal. Loss of protective sensory perception both feet.  Assessment: Peripheral neuropathy following back surgery 11 years ago. Hx of Osteomyelitis with amputation 2nd ray left. New lesion under 2nd MPJ right with open ulcer, healing in progress.  Slow progressing with decreased diameter to 0.2cm in width and 0.5 cm long. Healed ulcer 3rd MPJ plantar left with residual callus and intradermal bleeding..  Plan: Continue to stay off of feet if possible. Tx 1. Right foot lesion debrided all devitalized tissue.  Aperture pad placed. Outer dressing applied with Amerigel ointment on right.  Keep the padding and do daily dressing change on right foot.  Extra padding dispensed x 3. Tx 2. Left foot old ulcer site debride. Return in 3 weeks.

## 2015-12-27 ENCOUNTER — Ambulatory Visit: Payer: BLUE CROSS/BLUE SHIELD | Admitting: Podiatry

## 2016-01-03 ENCOUNTER — Encounter: Payer: Self-pay | Admitting: Podiatry

## 2016-01-03 ENCOUNTER — Ambulatory Visit (INDEPENDENT_AMBULATORY_CARE_PROVIDER_SITE_OTHER): Payer: BLUE CROSS/BLUE SHIELD | Admitting: Podiatry

## 2016-01-03 VITALS — BP 142/80 | HR 75

## 2016-01-03 DIAGNOSIS — G5793 Unspecified mononeuropathy of bilateral lower limbs: Secondary | ICD-10-CM | POA: Diagnosis not present

## 2016-01-03 DIAGNOSIS — L97512 Non-pressure chronic ulcer of other part of right foot with fat layer exposed: Secondary | ICD-10-CM

## 2016-01-03 DIAGNOSIS — M21962 Unspecified acquired deformity of left lower leg: Secondary | ICD-10-CM | POA: Diagnosis not present

## 2016-01-03 NOTE — Progress Notes (Signed)
Subjective: 65year old male presents for follow up on right foot ulcer.  He was able to keep the pad for about 2 weeks. Left foot lesion has been more painful than the right foot.   HPI: New open lesion under right foot 2nd MPJ area since 4th week of September. He has history of osteomyelitis. Diagnosed with peripheral neuropathy. S/P loss of 2nd ray, toe and metatarsal left. Had 3rd toe flexor tenotomy left foot that helped him some. Recurring and slow healing ulcer under 3rd MPJ left foot resolved. Sits at work.   Objective: Ulcer progress:  Right foot: Complete healing of right foot ulcer under 2nd MPJ right. Area is covered with dry callused skin. Left foot: Bleeding callus with history of frequent skin breakdown under the 3rd MPJ area left foot. There is thicker and more intradermal bleeding in callus. No edema or associated erythema noted bilateral. Severe 2nd and 3rd toe contracture left. Vascular status are within normal. Loss of protective sensory perception both feet.  Assessment: Peripheral neuropathy following back surgery 11 years ago. Hx of Osteomyelitis with amputation 2nd ray left. New lesion under 2nd MPJ right complete healing achieved. Healed ulcer 3rd MPJ plantar left with residual callus and intradermal bleeding.  Plan: Both feet plantar lesions debrided and aperture pad applied. Extra padding dispensed x 2. Added more aperture pad at the existing orthotics. Return in One month.

## 2016-01-03 NOTE — Patient Instructions (Addendum)
Seen for follow up on right foot ulcer.  Thick build up callus over healed ulcer on both feet. Debrided and padded. Extra padding dispensed and added 1/8" pad to orthotics. Return in one month.

## 2016-01-28 ENCOUNTER — Other Ambulatory Visit: Payer: Self-pay | Admitting: Nurse Practitioner

## 2016-01-28 DIAGNOSIS — B182 Chronic viral hepatitis C: Secondary | ICD-10-CM

## 2016-02-04 ENCOUNTER — Ambulatory Visit
Admission: RE | Admit: 2016-02-04 | Discharge: 2016-02-04 | Disposition: A | Discharge status: Home / Self Care | Payer: BLUE CROSS/BLUE SHIELD | Source: Ambulatory Visit | Attending: Nurse Practitioner | Admitting: Nurse Practitioner

## 2016-02-04 DIAGNOSIS — B182 Chronic viral hepatitis C: Secondary | ICD-10-CM

## 2016-02-05 ENCOUNTER — Ambulatory Visit (INDEPENDENT_AMBULATORY_CARE_PROVIDER_SITE_OTHER): Payer: BLUE CROSS/BLUE SHIELD | Admitting: Podiatry

## 2016-02-05 ENCOUNTER — Encounter: Payer: Self-pay | Admitting: Podiatry

## 2016-02-05 DIAGNOSIS — G5793 Unspecified mononeuropathy of bilateral lower limbs: Secondary | ICD-10-CM | POA: Diagnosis not present

## 2016-02-05 DIAGNOSIS — L97522 Non-pressure chronic ulcer of other part of left foot with fat layer exposed: Secondary | ICD-10-CM | POA: Diagnosis not present

## 2016-02-05 DIAGNOSIS — M21962 Unspecified acquired deformity of left lower leg: Secondary | ICD-10-CM

## 2016-02-05 NOTE — Patient Instructions (Signed)
Ulcerated callus left foot debrided and padded. Keep the pad in place for a week at least. Return in 2 weeks. Replace pad when original pad comes off.

## 2016-02-05 NOTE — Progress Notes (Signed)
Subjective: 66year old male presents for follow up on right foot ulcer.  Stated that he is ok with the right foot but the left foot has a thick lesion. He was able to keep the pad for about 2 weeks.  HPI: Hx of recurring ulcer under 3rd MPJ bilateral. Last open lesion was on right foot and been healed. He has history of osteomyelitis. Diagnosed with peripheral neuropathy. S/P loss of 2nd ray, toe and metatarsal left. Had 3rd toe flexor tenotomy left foot that helped him some.   Objective: Left foot: Bleeding callus with open base with loss of healthy skin under the 3rd MPJ area left foot. Drainage is minimum. No edema or associated erythema noted bilateral. Severe 2nd and 3rd toe contracture left. Vascular status are within normal. Loss of protective sensory perception both feet.  Assessment: Peripheral neuropathy following back surgery 11 years ago. Hx of Osteomyelitis with amputation 2nd ray left. Recurring ulcer under 3rd MPJ left foot.   Plan: Left foot lesion debrided. Aperture pad placed with Amerigel dressing. Return in 2 weeks.

## 2016-02-19 ENCOUNTER — Ambulatory Visit (INDEPENDENT_AMBULATORY_CARE_PROVIDER_SITE_OTHER): Payer: BLUE CROSS/BLUE SHIELD | Admitting: Podiatry

## 2016-02-19 ENCOUNTER — Encounter: Payer: Self-pay | Admitting: Podiatry

## 2016-02-19 DIAGNOSIS — G5793 Unspecified mononeuropathy of bilateral lower limbs: Secondary | ICD-10-CM | POA: Diagnosis not present

## 2016-02-19 DIAGNOSIS — M21962 Unspecified acquired deformity of left lower leg: Secondary | ICD-10-CM | POA: Diagnosis not present

## 2016-02-19 NOTE — Patient Instructions (Signed)
Follow up on left foot ulcer. Debrided and dispensed care pack. Return in 3 weeks.

## 2016-02-19 NOTE — Progress Notes (Signed)
Subjective: 66year old male presents for follow up on left foot ulcer. Been using pad that was dispensed.   HPI: Hx of recurring ulcer under 3rd MPJ bilateral. Last open lesion was on right foot and been healed. He has history of osteomyelitis. Diagnosed with peripheral neuropathy. S/P loss of 2nd ray, toe and metatarsal left. Had 3rd toe flexor tenotomy left foot that helped him some.   Objective: Left foot: Bleeding callus with open base with loss of healthy skin under the 3rd MPJ area left foot. No drainage noted. No edema or associated erythema noted bilateral. Severe 2nd and 3rd toe contracture left. Vascular status are within normal. Loss of protective sensory perception both feet.  Assessment: Peripheral neuropathy following back surgery 11 years ago. Hx of Osteomyelitis with amputation 2nd ray left. In proved ulcer under 3rd MPJ left foot. Dry base with no drainage.  Plan: Left foot lesion debrided.  Aperture pad dispensed x 2 with instruction. Return in 3 weeks.

## 2016-03-11 ENCOUNTER — Encounter: Payer: Self-pay | Admitting: Podiatry

## 2016-03-11 ENCOUNTER — Ambulatory Visit (INDEPENDENT_AMBULATORY_CARE_PROVIDER_SITE_OTHER): Payer: BLUE CROSS/BLUE SHIELD | Admitting: Podiatry

## 2016-03-11 DIAGNOSIS — G5793 Unspecified mononeuropathy of bilateral lower limbs: Secondary | ICD-10-CM | POA: Diagnosis not present

## 2016-03-11 DIAGNOSIS — M21962 Unspecified acquired deformity of left lower leg: Secondary | ICD-10-CM | POA: Diagnosis not present

## 2016-03-11 DIAGNOSIS — L97522 Non-pressure chronic ulcer of other part of left foot with fat layer exposed: Secondary | ICD-10-CM | POA: Diagnosis not present

## 2016-03-11 DIAGNOSIS — M79673 Pain in unspecified foot: Secondary | ICD-10-CM

## 2016-03-11 NOTE — Progress Notes (Signed)
Subjective: 66year old male presents for follow up on left foot ulcer. Been using pad that was dispensed. He noted of a small drainage. Still wants to keep 3 weeks interval in appointment.  HPI: Hx of recurring ulcer under 3rd MPJ left foot. Last open lesion was on right foot and been healed. He has history of osteomyelitis. Diagnosed with peripheral neuropathy. S/P loss of 2nd ray, toe and metatarsal left. Had 3rd toe flexor tenotomy left foot that helped him some.   Objective: Left foot: Bleeding callus with open base with loss of healthy skin under the 3rd MPJ area left foot. Minimum drainage noted. No edema or associated erythema noted bilateral. Severe 2nd and 3rd toe contracture left. Vascular status are within normal. Loss of protective sensory perception both feet.  Assessment: Peripheral neuropathy following back surgery 11 years ago. Hx of Osteomyelitis with amputation 2nd ray left. On going ulcerating callus under 3rd MPJ left foot.  Dry base with no drainage.  Plan: Left foot lesion debrided.  Aperture pad dispensed x 2 with instruction. Return in 3 weeks.

## 2016-03-11 NOTE — Patient Instructions (Signed)
Follow up on left foot ulcer. Improving slowly. Continue to take load off with padding.  Spare padding dispensed with tape.  Return in 3 weeks.

## 2016-03-14 ENCOUNTER — Encounter: Payer: BLUE CROSS/BLUE SHIELD | Attending: Surgery | Admitting: Surgery

## 2016-03-14 ENCOUNTER — Other Ambulatory Visit: Payer: Self-pay | Admitting: Surgery

## 2016-03-14 ENCOUNTER — Ambulatory Visit
Admission: RE | Admit: 2016-03-14 | Discharge: 2016-03-14 | Disposition: A | Discharge status: Home / Self Care | Payer: BLUE CROSS/BLUE SHIELD | Source: Ambulatory Visit | Attending: Surgery | Admitting: Surgery

## 2016-03-14 DIAGNOSIS — Z79899 Other long term (current) drug therapy: Secondary | ICD-10-CM | POA: Insufficient documentation

## 2016-03-14 DIAGNOSIS — L089 Local infection of the skin and subcutaneous tissue, unspecified: Secondary | ICD-10-CM | POA: Diagnosis present

## 2016-03-14 DIAGNOSIS — I10 Essential (primary) hypertension: Secondary | ICD-10-CM | POA: Insufficient documentation

## 2016-03-14 DIAGNOSIS — F419 Anxiety disorder, unspecified: Secondary | ICD-10-CM | POA: Insufficient documentation

## 2016-03-14 DIAGNOSIS — L600 Ingrowing nail: Secondary | ICD-10-CM | POA: Diagnosis not present

## 2016-03-14 DIAGNOSIS — X58XXXA Exposure to other specified factors, initial encounter: Secondary | ICD-10-CM | POA: Diagnosis not present

## 2016-03-14 DIAGNOSIS — S93105A Unspecified dislocation of left toe(s), initial encounter: Secondary | ICD-10-CM | POA: Insufficient documentation

## 2016-03-14 DIAGNOSIS — M109 Gout, unspecified: Secondary | ICD-10-CM | POA: Insufficient documentation

## 2016-03-14 DIAGNOSIS — M7989 Other specified soft tissue disorders: Secondary | ICD-10-CM | POA: Diagnosis not present

## 2016-03-14 DIAGNOSIS — Z87891 Personal history of nicotine dependence: Secondary | ICD-10-CM | POA: Diagnosis not present

## 2016-03-14 DIAGNOSIS — G629 Polyneuropathy, unspecified: Secondary | ICD-10-CM | POA: Diagnosis not present

## 2016-03-14 DIAGNOSIS — F329 Major depressive disorder, single episode, unspecified: Secondary | ICD-10-CM | POA: Diagnosis not present

## 2016-03-14 DIAGNOSIS — L97521 Non-pressure chronic ulcer of other part of left foot limited to breakdown of skin: Secondary | ICD-10-CM | POA: Diagnosis not present

## 2016-03-14 DIAGNOSIS — G4733 Obstructive sleep apnea (adult) (pediatric): Secondary | ICD-10-CM | POA: Insufficient documentation

## 2016-03-14 DIAGNOSIS — M1990 Unspecified osteoarthritis, unspecified site: Secondary | ICD-10-CM | POA: Insufficient documentation

## 2016-03-15 NOTE — Progress Notes (Signed)
RESHAD, SAAB ([id number]) Visit Report for 03/14/2016 Abuse/Suicide Risk Screen Details Patient Name: William Solis, William Solis Date of Service: 03/14/2016 3:15 PM Medical Record Number: [id number] Patient Account Number: [id number] Date of Birth/Sex: [birth year] (66 y.o. Male) Treating RN: Ahmed Prima Primary Care Altonio Schwertner: Billey Chang Other Clinician: Referring Kayton Ripp: Estelle Grumbles Treating Alizandra Loh/Extender: Frann Rider in Treatment: 0 Abuse/Suicide Risk Screen Items Answer ABUSE/SUICIDE RISK SCREEN: Has anyone close to you tried to hurt or harm you recentlyo No Do you feel uncomfortable with anyone in your familyo No Has anyone forced you do things that you didnot want to doo No Do you have any thoughts of harming yourselfo No Patient displays signs or symptoms of abuse and/or neglect. No Electronic Signature(s) Signed: 03/14/2016 4:53:33 PM By: Alric Quan Entered By: Alric Quan on 03/14/2016 15:17:35 William Solis ([id number]) -------------------------------------------------------------------------------- Activities of Daily Living Details Patient Name: William Solis Date of Service: 03/14/2016 3:15 PM Medical Record Number: [id number] Patient Account Number: [id number] Date of Birth/Sex: 12-Mar-1950 (66 y.o. Male) Treating RN: Ahmed Prima Primary Care Jhamari Markowicz: Billey Chang Other Clinician: Referring Uchechukwu Dhawan: Estelle Grumbles Treating Merci Walthers/Extender: Frann Rider in Treatment: 0 Activities of Daily Living Items Answer Activities of Daily Living (Please select one for each item) Drive Automobile Completely Able Take Medications Completely Able Use Telephone Completely Able Care for Appearance Completely Able Use Toilet Completely Able Bath / Shower Completely Able Dress Self Completely Able Feed Self Completely Able Walk Completely Able Get In / Out Bed Completely [place] for Self Completely Able Electronic Signature(s) Signed: 03/14/2016 4:53:33 PM By: Alric Quan Entered By: Alric Quan on 03/14/2016 15:17:59 William Solis ([id number]) -------------------------------------------------------------------------------- Education Assessment Details Patient Name: William Solis Date of Service: 03/14/2016 3:15 PM Medical Record Number: [id number] Patient Account Number: [id number] Date of Birth/Sex: 1950/07/18 (66 y.o. Male) Treating RN: Carolyne Fiscal, Debi Primary Care Maybell Misenheimer: Billey Chang Other Clinician: Referring Chalon Zobrist: Estelle Grumbles Treating Hailee Hollick/Extender: Frann Rider in Treatment: 0 Primary Learner Assessed: Patient Learning Preferences/Education Level/Primary Language Learning Preference: Explanation, Printed Material Highest Education Level: College or Above Preferred Language: English Cognitive Barrier Assessment/Beliefs Language Barrier: No Translator Needed: No Memory Deficit: No Emotional Barrier: No Cultural/Religious Beliefs Affecting Medical No Care: Physical Barrier Assessment Impaired Vision: Yes Glasses Impaired Hearing: No Decreased Hand dexterity: No Knowledge/Comprehension Assessment Knowledge Level: Medium Comprehension Level: Medium Ability to understand written Medium instructions: Ability to understand verbal Medium instructions: Motivation Assessment Anxiety Level: Calm Cooperation: Cooperative Education Importance: Acknowledges Need Interest in Health Problems: Asks Questions Perception: Coherent Willingness to Engage in Self- Medium Management Activities: Readiness to Engage in Self- Medium Management Activities: Electronic Signature(s) William Solis, William Solis ([id number]) Signed: 03/14/2016 4:53:33 PM By: Alric Quan Entered By: Alric Quan on 03/14/2016 15:18:21 William Solis  ([id number]) -------------------------------------------------------------------------------- Fall Risk Assessment Details Patient Name: William Solis Date of Service: 03/14/2016 3:15 PM Medical Record Number: [id number] Patient Account Number: [id number] Date of Birth/Sex: [birth year] (66 y.o. Male) Treating RN: Carolyne Fiscal, Debi Primary Care Vanden Fawaz: Billey Chang Other Clinician: Referring Darek Eifler: Estelle Grumbles Treating Jabori Henegar/Extender: Frann Rider in Treatment: 0 Fall Risk Assessment Items Have you had 2 or more falls in the last 12 monthso 0 Yes Have you had any fall that resulted in injury in the last 12 monthso 0 No FALL RISK ASSESSMENT: History of falling - immediate or within 3 months 0 No Secondary diagnosis 0 No Ambulatory aid None/bed rest/wheelchair/nurse 0 No Crutches/cane/walker 0  No Furniture 0 No IV Access/Saline Lock 0 No Gait/Training Normal/bed rest/immobile 0 No Weak 0 No Impaired 0 No Mental Status Oriented to own ability 0 Yes Electronic Signature(s) Signed: 03/14/2016 4:53:33 PM By: Alric Quan Entered By: Alric Quan on 03/14/2016 15:18:43 William Solis ([id number]) -------------------------------------------------------------------------------- Foot Assessment Details Patient Name: William Solis Date of Service: 03/14/2016 3:15 PM Medical Record Number: [id number] Patient Account Number: [id number] Date of Birth/Sex: October 08, 1950 (66 y.o. Male) Treating RN: Carolyne Fiscal, Debi Primary Care Larrisa Cravey: Billey Chang Other Clinician: Referring Javoris Star: Estelle Grumbles Treating Benny Deutschman/Extender: Frann Rider in Treatment: 0 Foot Assessment Items Site Locations + = Sensation present, - = Sensation absent, C = Callus, U = Ulcer R = Redness, W = Warmth, M = Maceration, PU = Pre-ulcerative lesion F = Fissure, S = Swelling, D = Dryness Assessment Right: Left: Other Deformity: No No Prior Foot Ulcer: No No Prior  Amputation: No No Charcot Joint: No No Ambulatory Status: Ambulatory Without Help Gait: Steady Electronic Signature(s) Signed: 03/14/2016 4:53:33 PM By: Alric Quan Entered By: Alric Quan on 03/14/2016 15:21:08 William Solis ([id number]) -------------------------------------------------------------------------------- Nutrition Risk Assessment Details Patient Name: William Solis Date of Service: 03/14/2016 3:15 PM Medical Record Number: [id number] Patient Account Number: [id number] Date of Birth/Sex: August 18, 1950 (66 y.o. Male) Treating RN: Carolyne Fiscal, Debi Primary Care Tylin Stradley: Billey Chang Other Clinician: Referring Kailey Esquilin: Estelle Grumbles Treating Jemarcus Dougal/Extender: Frann Rider in Treatment: 0 Height (in): 72 Weight (lbs): 244 Body Mass Index (BMI): 33.1 Nutrition Risk Assessment Items NUTRITION RISK SCREEN: I have an illness or condition that made me change the kind and/or 0 No amount of food I eat I eat fewer than two meals per day 0 No I eat few fruits and vegetables, or milk products 0 No I have three or more drinks of beer, liquor or wine almost every day 0 No I have tooth or mouth problems that make it hard for me to eat 0 No I don't always have enough money to buy the food I need 0 No I eat alone most of the time 0 No I take three or more different prescribed or over-the-counter drugs a 1 Yes day Without wanting to, I have lost or gained 10 pounds in the last six 0 No months I am not always physically able to shop, cook and/or feed myself 0 No Nutrition Protocols Good Risk Protocol Moderate Risk Protocol Electronic Signature(s) Signed: 03/14/2016 4:53:33 PM By: Alric Quan Entered By: Alric Quan on 03/14/2016 15:18:50

## 2016-03-15 NOTE — Progress Notes (Signed)
William Solis, William Solis ([id number]) Visit Report for 03/14/2016 Allergy List Details Patient Name: William Solis, William Solis Date of Service: 03/14/2016 3:15 PM Medical Record Number: [id number] Patient Account Number: [id number] Date of Birth/Sex: 1950/12/20 (66 y.o. Male) Treating RN: Ahmed Prima Primary Care Kaysan Peixoto: Billey Chang Other Clinician: Referring Makela Niehoff: Estelle Grumbles Treating Georgine Wiltse/Extender: Frann Rider in Treatment: 0 Allergies Active Allergies NKDA Allergy Notes Electronic Signature(s) Signed: 03/14/2016 4:53:33 PM By: Alric Quan Entered By: Alric Quan on 03/14/2016 15:13:12 William Solis ([id number]) -------------------------------------------------------------------------------- Arrival Information Details Patient Name: William Solis Date of Service: 03/14/2016 3:15 PM Medical Record Number: [id number] Patient Account Number: [id number] Date of Birth/Sex: August 03, 1950 (66 y.o. Male) Treating RN: Carolyne Fiscal, Debi Primary Care Orli Degrave: Billey Chang Other Clinician: Referring Nara Paternoster: Estelle Grumbles Treating Lasonia Casino/Extender: Frann Rider in Treatment: 0 Visit Information Patient Arrived: Ambulatory Arrival Time: 15:10 Accompanied By: wife Transfer Assistance: None Patient Identification Verified: Yes Secondary Verification Process Yes Completed: Patient Requires Transmission-Based No Precautions: Patient Has Alerts: Yes Patient Alerts: + Hep C Electronic Signature(s) Signed: 03/14/2016 4:53:33 PM By: Alric Quan Entered By: Alric Quan on 03/14/2016 15:19:31 William Solis ([id number]) -------------------------------------------------------------------------------- Clinic Level of Care Assessment Details Patient Name: William Solis Date of Service: 03/14/2016 3:15 PM Medical Record Number: [id number] Patient Account Number: [id number] Date of Birth/Sex: 20-Oct-1950 (66 y.o. Male) Treating RN:  Carolyne Fiscal, Debi Primary Care Delaney Schnick: Billey Chang Other Clinician: Referring Zayana Salvador: Estelle Grumbles Treating Jazziel Fitzsimmons/Extender: Frann Rider in Treatment: 0 Clinic Level of Care Assessment Items TOOL 1 Quantity Score X - Use when EandM and Procedure is performed on INITIAL visit 1 0 ASSESSMENTS - Nursing Assessment / Reassessment X - General Physical Exam (combine w/ comprehensive assessment (listed just 1 20 below) when performed on new pt. evals) X - Comprehensive Assessment (HX, ROS, Risk Assessments, Wounds Hx, etc.) 1 25 ASSESSMENTS - Wound and Skin Assessment / Reassessment '[]'$  - Dermatologic / Skin Assessment (not related to wound area) 0 ASSESSMENTS - Ostomy and/or Continence Assessment and Care '[]'$  - Incontinence Assessment and Management 0 '[]'$  - Ostomy Care Assessment and Management (repouching, etc.) 0 PROCESS - Coordination of Care X - Simple Patient / Family Education for ongoing care 1 15 '[]'$  - Complex (extensive) Patient / Family Education for ongoing care 0 '[]'$  - Staff obtains Programmer, systems, Records, Test Results / Process Orders 0 '[]'$  - Staff telephones HHA, Nursing Homes / Clarify orders / etc 0 '[]'$  - Routine Transfer to another Facility (non-emergent condition) 0 '[]'$  - Routine Hospital Admission (non-emergent condition) 0 X - New Admissions / Biomedical engineer / Ordering NPWT, Apligraf, etc. 1 15 '[]'$  - Emergency Hospital Admission (emergent condition) 0 PROCESS - Special Needs '[]'$  - Pediatric / Minor Patient Management 0 '[]'$  - Isolation Patient Management 0 William Solis, William Solis. ([id number]) '[]'$  - Hearing / Language / Visual special needs 0 '[]'$  - Assessment of Community assistance (transportation, D/C planning, etc.) 0 '[]'$  - Additional assistance / Altered mentation 0 '[]'$  - Support Surface(s) Assessment (bed, cushion, seat, etc.) 0 INTERVENTIONS - Miscellaneous '[]'$  - External ear exam 0 '[]'$  - Patient Transfer (multiple staff / Civil Service fast streamer / Similar devices) 0 '[]'$  -  Simple Staple / Suture removal (25 or less) 0 '[]'$  - Complex Staple / Suture removal (26 or more) 0 '[]'$  - Hypo/Hyperglycemic Management (do not check if billed separately) 0 X - Ankle / Brachial Index (ABI) - do not check if billed separately 1 15 Has the patient been seen at the hospital within the last three  years: Yes Total Score: 90 Level Of Care: New/Established - Level 3 Electronic Signature(s) Signed: 03/14/2016 4:53:33 PM By: Alric Quan Entered By: Alric Quan on 03/14/2016 16:27:02 William Solis ([id number]) -------------------------------------------------------------------------------- Encounter Discharge Information Details Patient Name: William Solis Date of Service: 03/14/2016 3:15 PM Medical Record Number: [id number] Patient Account Number: [id number] Date of Birth/Sex: November 09, 1950 (66 y.o. Male) Treating RN: Carolyne Fiscal, Debi Primary Care Merriel Zinger: Billey Chang Other Clinician: Referring Persephone Schriever: Estelle Grumbles Treating Tammela Bales/Extender: Frann Rider in Treatment: 0 Encounter Discharge Information Items Discharge Pain Level: 0 Discharge Condition: Stable Ambulatory Status: Ambulatory Discharge Destination: Home Transportation: Private Auto Accompanied By: wife Schedule Follow-up Appointment: Yes Medication Reconciliation completed and provided to Patient/Care No Edy Mcbane: Provided on Clinical Summary of Care: 03/14/2016 Form Type Recipient Paper Patient DM Electronic Signature(s) Signed: 03/14/2016 3:59:26 PM By: Ruthine Dose Entered By: Ruthine Dose on 03/14/2016 15:59:25 William Solis ([id number]) -------------------------------------------------------------------------------- Lower Extremity Assessment Details Patient Name: William Solis Date of Service: 03/14/2016 3:15 PM Medical Record Number: [id number] Patient Account Number: [id number] Date of Birth/Sex: Jul 25, 1950 (66 y.o. Male) Treating RN: Carolyne Fiscal,  Debi Primary Care Emerson Barretto: Billey Chang Other Clinician: Referring Jameriah Trotti: Estelle Grumbles Treating Biana Haggar/Extender: Frann Rider in Treatment: 0 Vascular Assessment Pulses: Dorsalis Pedis Palpable: [Left:Yes] Posterior Tibial Extremity colors, hair growth, and conditions: Extremity Color: [Left:Normal] Temperature of Extremity: [Left:Warm] Capillary Refill: [Left:< 3 seconds] Blood Pressure: Brachial: [Left:142] Dorsalis Pedis: 160 [Left:Dorsalis Pedis:] Ankle: Posterior Tibial: 158 [Left:Posterior Tibial: 1.13] Toe Nail Assessment Left: Right: Thick: Yes Discolored: Yes Deformed: Yes Improper Length and Hygiene: No Electronic Signature(s) Signed: 03/14/2016 4:53:33 PM By: Alric Quan Entered By: Alric Quan on 03/14/2016 15:28:55 William Solis ([id number]) -------------------------------------------------------------------------------- Multi Wound Chart Details Patient Name: William Solis Date of Service: 03/14/2016 3:15 PM Medical Record Number: [id number] Patient Account Number: [id number] Date of Birth/Sex: January 17, 1951 (66 y.o. Male) Treating RN: Carolyne Fiscal, Debi Primary Care Bralin Garry: Billey Chang Other Clinician: Referring Brandyn Thien: Estelle Grumbles Treating Nalleli Largent/Extender: Frann Rider in Treatment: 0 Vital Signs Height(in): 72 Pulse(bpm): 93 Weight(lbs): 244 Blood Pressure 136/81 (mmHg): Body Mass Index(BMI): 33 Temperature(F): 97.8 Respiratory Rate 18 (breaths/min): Photos: [1:No Photos] [N/A:N/A] Wound Location: [1:Left Toe Great] [N/A:N/A] Wounding Event: [1:Gradually Appeared] [N/A:N/A] Primary Etiology: [1:Infection - not elsewhere classified] [N/A:N/A] Comorbid History: [1:Cataracts, Hypertension, Gout, Osteoarthritis, Neuropathy] [N/A:N/A] Date Acquired: [1:03/10/2016] [N/A:N/A] Weeks of Treatment: [1:0] [N/A:N/A] Wound Status: [1:Open] [N/A:N/A] Measurements L x W x D 0.3x1x0.2 [N/A:N/A] (cm) Area  (cm) : [1:0.236] [N/A:N/A] Volume (cm) : [1:0.047] [N/A:N/A] Classification: [1:Partial Thickness] [N/A:N/A] Exudate Amount: [1:Large] [N/A:N/A] Exudate Type: [1:Purulent] [N/A:N/A] Exudate Color: [1:yellow, brown, green] [N/A:N/A] Wound Margin: [1:Distinct, outline attached] [N/A:N/A] Granulation Amount: [1:None Present (0%)] [N/A:N/A] Necrotic Amount: [1:Large (67-100%)] [N/A:N/A] Epithelialization: [1:None] [N/A:N/A] Debridement: [1:Debridement ([id number])] [N/A:N/A] Pre-procedure [1:15:40] [N/A:N/A] Verification/Time Out Taken: Pain Control: [1:Lidocaine 4% Topical Solution] [N/A:N/A] Tissue Debrided: Fibrin/Slough, Exudates, N/A N/A Subcutaneous Level: Skin/Subcutaneous N/A N/A Tissue Debridement Area (sq 0.3 N/A N/A cm): Instrument: Forceps, Scissors N/A N/A Bleeding: Minimum N/A N/A Hemostasis Achieved: Pressure N/A N/A Procedural Pain: 0 N/A N/A Post Procedural Pain: 0 N/A N/A Debridement Treatment Procedure was tolerated N/A N/A Response: well Post Debridement 1.3x1.8x0.4 N/A N/A Measurements L x W x D (cm) Post Debridement 0.735 N/A N/A Volume: (cm) Periwound Skin Texture: No Abnormalities Noted N/A N/A Periwound Skin No Abnormalities Noted N/A N/A Moisture: Periwound Skin Color: No Abnormalities Noted N/A N/A Temperature: No Abnormality N/A N/A Tenderness on No N/A N/A Palpation: Wound Preparation: Ulcer Cleansing: N/A N/A Rinsed/Irrigated with  Saline Topical Anesthetic Applied: Other: lidocaine 4% Procedures Performed: Debridement N/A N/A Treatment Notes Electronic Signature(s) Signed: 03/14/2016 4:17:58 PM By: Christin Fudge MD, FACS Entered By: Christin Fudge on 03/14/2016 16:17:58 William Solis ([id number]) -------------------------------------------------------------------------------- [place] Details Patient Name: William Solis Date of Service: 03/14/2016 3:15 PM Medical Record Number: [id number] Patient  Account Number: [id number] Date of Birth/Sex: 04-04-1950 (66 y.o. Male) Treating RN: Carolyne Fiscal, Debi Primary Care Kanika Bungert: Billey Chang Other Clinician: Referring Myrtis Maille: Estelle Grumbles Treating Benancio Osmundson/Extender: Frann Rider in Treatment: 0 Active Inactive ` Nutrition Nursing Diagnoses: Imbalanced nutrition Goals: Patient/caregiver agrees to and verbalizes understanding of need to use nutritional supplements and/or vitamins as prescribed Date Initiated: 03/14/2016 Target Resolution Date: 05/17/2016 Goal Status: Active Interventions: Assess patient nutrition upon admission and as needed per policy Notes: ` Orientation to the Wound Care Program Nursing Diagnoses: Knowledge deficit related to the wound healing center program Goals: Patient/caregiver will verbalize understanding of the [place] Date Initiated: 03/14/2016 Target Resolution Date: 03/29/2016 Goal Status: Active Interventions: Provide education on orientation to the wound center Notes: ` Wound/Skin Impairment Nursing Diagnoses: Impaired tissue integrity William Solis, William Solis ([id number]) Goals: Ulcer/skin breakdown will have a volume reduction of 80% by week 12 Date Initiated: 03/14/2016 Target Resolution Date: 05/24/2016 Goal Status: Active Interventions: Assess patient/caregiver ability to perform ulcer/skin care regimen upon admission and as needed Notes: Electronic Signature(s) Signed: 03/14/2016 4:53:33 PM By: Alric Quan Entered By: Alric Quan on 03/14/2016 15:37:23 William Solis ([id number]) -------------------------------------------------------------------------------- Pain Assessment Details Patient Name: William Solis Date of Service: 03/14/2016 3:15 PM Medical Record Number: [id number] Patient Account Number: [id number] Date of Birth/Sex: [birth year] (66 y.o. Male) Treating RN: Ahmed Prima Primary Care Hermon Zea: Billey Chang Other  Clinician: Referring Keirstin Musil: Estelle Grumbles Treating Aydian Dimmick/Extender: Frann Rider in Treatment: 0 Active Problems Location of Pain Severity and Description of Pain Patient Has Paino No Site Locations With Dressing Change: No Pain Management and Medication Current Pain Management: Electronic Signature(s) Signed: 03/14/2016 4:53:33 PM By: Alric Quan Entered By: Alric Quan on 03/14/2016 15:10:56 William Solis ([id number]) -------------------------------------------------------------------------------- Patient/Caregiver Education Details Patient Name: William Solis Date of Service: 03/14/2016 3:15 PM Medical Record Number: [id number] Patient Account Number: [id number] Date of Birth/Gender: August 02, 1950 (65 y.o. Male) Treating RN: Ahmed Prima Primary Care Physician: Billey Chang Other Clinician: Referring Physician: Estelle Grumbles Treating Physician/Extender: Frann Rider in Treatment: 0 Education Assessment Education Provided To: Patient Education Topics Provided Welcome To [facility] [place] [facility]: Handouts: Welcome To [facility] Methods: Explain/Verbal Responses: State content correctly Wound/Skin Impairment: Handouts: Other: change dressing as ordered Methods: Demonstration, Explain/Verbal Responses: State content correctly Electronic Signature(s) Signed: 03/14/2016 4:53:33 PM By: Alric Quan Entered By: Alric Quan on 03/14/2016 15:40:05 William Solis ([id number]) -------------------------------------------------------------------------------- Wound Assessment Details Patient Name: William Solis Date of Service: 03/14/2016 3:15 PM Medical Record Number: [id number] Patient Account Number: [id number] Date of Birth/Sex: 29-Nov-1950 (66 y.o. Male) Treating RN: Ahmed Prima Primary Care Marlina Cataldi: Billey Chang Other Clinician: Referring Brayton Baumgartner: Estelle Grumbles Treating Emilo Gras/Extender: Frann Rider in Treatment: 0 Wound Status Wound Number: 1 Primary Infection - not elsewhere classified Etiology: Wound Location: Left Toe Great Wound Open Wounding Event: Gradually Appeared Status: Date Acquired: 03/10/2016 Comorbid Cataracts, Hypertension, Gout, Weeks Of Treatment: 0 History: Osteoarthritis, Neuropathy Clustered Wound: No Photos Photo Uploaded By: Alric Quan on 03/14/2016 16:29:36 Wound Measurements Length: (cm) 0.3 Width: (cm) 1 Depth: (cm) 0.2 Area: (cm) 0.236 Volume: (cm) 0.047 % Reduction in Area: %  Reduction in Volume: Epithelialization: None Tunneling: No Undermining: No Wound Description Classification: Partial Thickness Foul Odor Afte Wound Margin: Distinct, outline attached Slough/Fibrino Exudate Amount: Large Exudate Type: Purulent Exudate Color: yellow, brown, green r Cleansing: No No Wound Bed Granulation Amount: None Present (0%) Necrotic Amount: Large (67-100%) Necrotic Quality: Adherent [address] William Solis, CLUTTER. ([id number]) Texture Color No Abnormalities Noted: No No Abnormalities Noted: No Moisture Temperature / Pain No Abnormalities Noted: No Temperature: No Abnormality Wound Preparation Ulcer Cleansing: Rinsed/Irrigated with Saline Topical Anesthetic Applied: Other: lidocaine 4%, Treatment Notes Wound #1 (Left Toe Great) 1. Cleansed with: Clean wound with Normal Saline 2. Anesthetic Topical Lidocaine 4% cream to wound bed prior to debridement 4. Dressing Applied: Aquacel Ag 5. Secondary Dressing Applied Dry Gauze Kerlix/Conform 7. Secured with Recruitment consultant) Signed: 03/14/2016 4:53:33 PM By: Alric Quan Entered By: Alric Quan on 03/14/2016 15:30:22 William Solis ([id number]) -------------------------------------------------------------------------------- [place] Details Patient Name: William Solis Date of Service: 03/14/2016 3:15 PM Medical  Record Number: [id number] Patient Account Number: [id number] Date of Birth/Sex: 27-Dec-1950 (66 y.o. Male) Treating RN: Carolyne Fiscal, Debi Primary Care Carlye Panameno: Billey Chang Other Clinician: Referring Chatham Howington: Estelle Grumbles Treating Beverly Suriano/Extender: Frann Rider in Treatment: 0 Vital Signs Time Taken: 15:10 Temperature (F): 97.8 Height (in): 72 Pulse (bpm): 93 Source: Measured Respiratory Rate (breaths/min): 18 Weight (lbs): 244 Blood Pressure (mmHg): 136/81 Source: Measured Reference Range: 80 - 120 mg / dl Body Mass Index (BMI): 33.1 Electronic Signature(s) Signed: 03/14/2016 4:53:33 PM By: Alric Quan Entered By: Alric Quan on 03/14/2016 15:12:40

## 2016-03-15 NOTE — Progress Notes (Addendum)
William Solis, William Solis ([id number]) Visit Report for 03/14/2016 Chief Complaint Document Details Patient Name: William Solis, William Solis Date of Service: 03/14/2016 3:15 PM Medical Record Number: [id number] Patient Account Number: [id number] Date of Birth/Sex: 07/22/1950 (66 y.o. Male) Treating RN: Ahmed Prima Primary Care Provider: Billey Chang Other Clinician: Referring Provider: Billey Chang Treating Provider/Extender: Frann Rider in Treatment: 0 Information Obtained from: Patient Chief Complaint Patient presents to the wound care center for a consult due non healing wound to the left big toe for 2 days Electronic Signature(s) Signed: 03/14/2016 4:19:57 PM By: Christin Fudge MD, FACS Entered By: Christin Fudge on 03/14/2016 16:19:57 William Solis ([id number]) -------------------------------------------------------------------------------- HPI Details Patient Name: William Solis Date of Service: 03/14/2016 3:15 PM Medical Record Number: [id number] Patient Account Number: [id number] Date of Birth/Sex: 29-Mar-1950 (66 y.o. Male) Treating RN: Ahmed Prima Primary Care Provider: Billey Chang Other Clinician: Referring Provider: Billey Chang Treating Provider/Extender: Frann Rider in Treatment: 0 History of Present Illness Location: discharge and pain around the nailbed of the left big toe Quality: Patient reports experiencing a dull pain to affected area(s). Severity: Patient states wound are getting worse. Duration: Patient has had the wound for < 2 days prior to presenting for treatment Timing: Pain in wound is constant (hurts all the time) Context: The wound appeared gradually over time Modifying Factors: Consults to this date include:seen the podiatrist a day before this happened Associated Signs and Symptoms: Patient reports having increase discharge. HPI Description: 66 year old male seen recently by his podiatrist on 03/11/2016 and treated for a left  foot ulcer with local care and debridement. 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 Patient says he has had no fever or any other symptoms Patient has a history of recurring ulcer under the third metatarsal of the left foot and has a history of osteomyelitis and peripheral neuropathy in the past. he status post second toe amputation and third toe flexor tenotomy on his left foot. He also has a history of peripheral neuropathy following back surgery 11 years ago. he also has a past medical history of hypertension, chronic low back pain, anxiety with depression, hepatitis C, osteomyelitis of the left foot in October 2015 and had a amputation of the left second toe by Dr. Sharol Given in 2015. status post spinal fusion and parathyroidectomy. He has never been a smoker. He has seen his pulmonologist for obstructive sleep apnea on CPAP, shortness of breath and cough. Electronic Signature(s) Signed: 03/14/2016 4:22:12 PM By: Christin Fudge MD, FACS Previous Signature: 03/14/2016 3:29:34 PM Version By: Christin Fudge MD, FACS Previous Signature: 03/14/2016 3:25:41 PM Version By: Christin Fudge MD, FACS Previous Signature: 03/14/2016 3:24:50 PM Version By: Christin Fudge MD, FACS Entered By: Christin Fudge on 03/14/2016 16:22:12 William Solis ([id number]) -------------------------------------------------------------------------------- AVULSION NAIL, SIMPLE/SINGLE Details Patient Name: William Solis Date of Service: 03/14/2016 3:15 PM Medical Record Number: [id number] Patient Account Number: [id number] Date of Birth/Sex: 1950-06-14 (66 y.o. Male) Treating RN: Cornell Barman Primary Care Provider: Billey Chang Other Clinician: Referring Provider: Billey Chang Treating Provider/Extender: Frann Rider in Treatment: 0 Procedure Performed  for: Wound #1 Left Toe Great Performed By: Physician Christin Fudge, MD Post Procedure Diagnosis Same as Pre-procedure Electronic Signature(s) Signed: 05/01/2016 10:39:28 AM By: Gretta Cool RN, BSN, Kim RN, BSN Entered By: Gretta Cool, RN, BSN, Kim on 05/01/2016 10:39:27 Legrand Rams  Viona Gilmore ([id number]) -------------------------------------------------------------------------------- Physical Exam Details Patient Name: William Solis, William Solis Date of Service: 03/14/2016 3:15 PM Medical Record Number: [id number] Patient Account Number: [id number] Date of Birth/Sex: Feb 26, 1950 (66 y.o. Male) Treating RN: Carolyne Fiscal, Debi Primary Care Provider: Billey Chang Other Clinician: Referring Provider: Jonni Sanger, CAMILLE Treating Provider/Extender: Frann Rider in Treatment: 0 Constitutional . Pulse regular. Respirations normal and unlabored. Afebrile. . Eyes Nonicteric. Reactive to light. Ears, Nose, Mouth, and Throat Lips, teeth, and gums WNL.Marland Kitchen Moist mucosa without lesions. Neck supple and nontender. No palpable supraclavicular or cervical adenopathy. Normal sized without goiter. Respiratory WNL. No retractions.. . Cardiovascular . Pedal Pulses WNL. ABI on the left is 1.13. No clubbing, cyanosis or edema. Gastrointestinal (GI) Abdomen without masses or tenderness.. No liver or spleen enlargement or tenderness.. Lymphatic No adneopathy. No adenopathy. No adenopathy. Musculoskeletal Adexa without tenderness or enlargement.. Digits and nails w/o clubbing, cyanosis, infection, petechiae, ischemia, or inflammatory conditions.. Integumentary (Hair, Skin) No suspicious lesions. No crepitus or fluctuance. No peri-wound warmth or erythema. No masses.Marland Kitchen Psychiatric Judgement and insight Intact.. No evidence of depression, anxiety, or agitation.. Notes the left big toenail is showing signs of previous fungal inflammation and on examination closely has come away from the nail bed laterally and without much  difficulty using a forcep and scissors I was able to remove the nail from the nail bed. Minimal bleeding controlled with pressure. There is no evidence of purulence, exposed bone or cellulitis. Electronic Signature(s) Signed: 03/14/2016 4:23:28 PM By: Christin Fudge MD, FACS Entered By: Christin Fudge on 03/14/2016 16:23:28 William Solis ([id number]) -------------------------------------------------------------------------------- Physician Orders Details Patient Name: William Solis Date of Service: 03/14/2016 3:15 PM Medical Record Number: [id number] Patient Account Number: [id number] Date of Birth/Sex: 05/31/1950 (66 y.o. Male) Treating RN: Carolyne Fiscal, Debi Primary Care Provider: Billey Chang Other Clinician: Referring Provider: Billey Chang Treating Provider/Extender: Frann Rider in Treatment: 0 Verbal / Phone Orders: Yes ClinicianCarolyne Fiscal, Debi Read Back and Verified: Yes Diagnosis Coding Wound Cleansing Wound #1 Left Toe Great o Clean wound with Normal Saline. o Cleanse wound with mild soap and water Anesthetic Wound #1 Left Toe Great o Topical Lidocaine 4% cream applied to wound bed prior to debridement Primary Wound Dressing Wound #1 Left Toe Great o Aquacel Ag Secondary Dressing Wound #1 Left Toe Great o Dry Gauze o Conform/Kerlix Dressing Change Frequency Wound #1 Left Toe Great o Change dressing every other day. Follow-up Appointments Wound #1 Left Toe Great o Return Appointment in 1 week. Additional Orders / Instructions Wound #1 Left Toe Great o Increase protein intake. Electronic Signature(s) Signed: 03/14/2016 4:26:35 PM By: Christin Fudge MD, FACS Signed: 03/14/2016 4:53:33 PM By: Willa Rough ([id number]) Entered By: Alric Quan on 03/14/2016 15:49:19 William Solis ([id number]) -------------------------------------------------------------------------------- Problem List  Details Patient Name: William Solis Date of Service: 03/14/2016 3:15 PM Medical Record Number: [id number] Patient Account Number: [id number] Date of Birth/Sex: [birth year] (66 y.o. Male) Treating RN: Ahmed Prima Primary Care Provider: Billey Chang Other Clinician: Referring Provider: Jonni Sanger, CAMILLE Treating Provider/Extender: Frann Rider in Treatment: 0 Active Problems ICD-10 Encounter Code Description Active Date Diagnosis L97.521 Non-pressure chronic ulcer of other part of left foot limited 03/14/2016 Yes to breakdown of skin L60.0 Ingrowing nail 03/14/2016 Yes Inactive Problems Resolved Problems Electronic Signature(s) Signed: 03/14/2016 4:17:53 PM By: Christin Fudge MD, FACS Entered By: Christin Fudge on 03/14/2016 16:17:52 William Solis ([id number]) -------------------------------------------------------------------------------- Progress Note Details Patient Name: William Solis Date of Service: 03/14/2016 3:15 PM Medical Record  Number: [id number] Patient Account Number: [id number] Date of Birth/Sex: [birth year] (66 y.o. Male) Treating RN: Carolyne Fiscal, Debi Primary Care Provider: Billey Chang Other Clinician: Referring Provider: Billey Chang Treating Provider/Extender: Frann Rider in Treatment: 0 Subjective Chief Complaint Information obtained from Patient Patient presents to the wound care center for a consult due non healing wound to the left big toe for 2 days History of Present Illness (HPI) The following HPI elements were documented for the patient's wound: Location: discharge and pain around the nailbed of the left big toe Quality: Patient reports experiencing a dull pain to affected area(s). Severity: Patient states wound are getting worse. Duration: Patient has had the wound for < 2 days prior to presenting for treatment Timing: Pain in wound is constant (hurts all the time) Context: The wound appeared gradually over time Modifying  Factors: Consults to this date include:seen the podiatrist a day before this happened Associated Signs and Symptoms: Patient reports having increase discharge. 66 year old male seen recently by his podiatrist on 03/11/2016 and treated for a left foot ulcer with local care and debridement. 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 Patient says he has had no fever or any other symptoms Patient has a history of recurring ulcer under the third metatarsal of the left foot and has a history of osteomyelitis and peripheral neuropathy in the past. he status post second toe amputation and third toe flexor tenotomy on his left foot. He also has a history of peripheral neuropathy following back surgery 11 years ago. he also has a past medical history of hypertension, chronic low back pain, anxiety with depression, hepatitis C, osteomyelitis of the left foot in October 2015 and had a amputation of the left second toe by Dr. Sharol Given in 2015. status post spinal fusion and parathyroidectomy. He has never been a smoker. He has seen his pulmonologist for obstructive sleep apnea on CPAP, shortness of breath and cough. Wound History Patient presents with 1 open wound that has been present for approximately 2 days. Patient has been treating wound in the following manner: bactroban and hydrogen peroxide 2 x a day. Laboratory tests have not been performed in the last month. Patient reportedly has not tested positive for an antibiotic resistant organism. Patient reportedly has tested positive for osteomyelitis. Patient reportedly has not had testing performed to evaluate circulation in the legs. Patient experiences the following problems associated with their wounds: swelling. William Solis, William Solis ([id number]) Patient History Information obtained from  Patient. Allergies NKDA Family History No family history of Cancer, Diabetes. Social History Former smoker - quit 1992, Marital Status - Married, Alcohol Use - Never, Drug Use - No History, Caffeine Use - Daily. Medical History Eyes Patient has history of Cataracts Cardiovascular Patient has history of Hypertension Musculoskeletal Patient has history of Gout, Osteoarthritis Neurologic Patient has history of Neuropathy Review of Systems (ROS) Constitutional Symptoms (General Health) The patient has no complaints or symptoms. Eyes Complains or has symptoms of Glasses / Contacts. Ear/Nose/Mouth/Throat The patient has no complaints or symptoms. Hematologic/Lymphatic The patient has no complaints or symptoms. Respiratory The patient has no complaints or symptoms. Gastrointestinal The patient has no complaints or symptoms. Endocrine Complains or has symptoms of Thyroid disease - parathyroid x1 removed. Genitourinary The patient has no complaints or symptoms. Immunological The patient has no complaints or symptoms. Integumentary (Skin) The  patient has no complaints or symptoms. Oncologic The patient has no complaints or symptoms. Psychiatric Complains or has symptoms of Anxiety, depression William Solis, William Solis. ([id number]) Medications dexedrine omega-3 fatty acids Vitamin D3 carvedilol 25 mg tablet oral tablet oral oxycodone 5 mg capsule oral capsule oral oxycodone 5 mg tablet oral tablet oral clonazepam 0.5 mg tablet oral tablet oral gabapentin 600 mg tablet oral tablet oral lisinopril 40 mg tablet oral tablet oral lisinopril 40 mg tablet oral tablet oral Proventil HFA 90 mcg/actuation aerosol inhaler inhalation HFA aerosol inhaler inhalation Lopressor 50 mg tablet oral tablet oral Calcium 500 + D 500 mg (1,250 mg)-200 unit tablet oral tablet oral fluticasone 50 mcg/actuation nasal spray,suspension nasal spray,suspension nasal meloxicam 7.5 mg tablet oral tablet  oral Restasis 0.05 % eye drops in a dropperette ophthalmic (eye) dropperette ophthalmic (eye) melatonin 5 mg tablet oral tablet oral escitalopram 20 mg tablet oral tablet oral cyclobenzaprine 10 mg tablet oral tablet oral mupirocin 2 % topical ointment topical ointment topical mecobalamin (vitamin B12) 5,000 mcg disintegrating tablet oral tablet,disintegrating oral ascorbic acid (vitamin C) 1,000 mg tablet oral tablet oral Objective Constitutional Pulse regular. Respirations normal and unlabored. Afebrile. Vitals Time Taken: 3:10 PM, Height: 72 in, Source: Measured, Weight: 244 lbs, Source: Measured, BMI: 33.1, Temperature: 97.8 F, Pulse: 93 bpm, Respiratory Rate: 18 breaths/min, Blood Pressure: 136/81 mmHg. Eyes Nonicteric. Reactive to light. Ears, Nose, Mouth, and Throat Lips, teeth, and gums WNL.Marland Kitchen Moist mucosa without lesions. Neck supple and nontender. No palpable supraclavicular or cervical adenopathy. Normal sized without goiter. Respiratory William Solis, William Solis. ([id number]) WNL. No retractions.. Cardiovascular Pedal Pulses WNL. ABI on the left is 1.13. No clubbing, cyanosis or edema. Gastrointestinal (GI) Abdomen without masses or tenderness.. No liver or spleen enlargement or tenderness.. Lymphatic No adneopathy. No adenopathy. No adenopathy. Musculoskeletal Adexa without tenderness or enlargement.. Digits and nails w/o clubbing, cyanosis, infection, petechiae, ischemia, or inflammatory conditions.Marland Kitchen Psychiatric Judgement and insight Intact.. No evidence of depression, anxiety, or agitation.. General Notes: the left big toenail is showing signs of previous fungal inflammation and on examination closely has come away from the nail bed laterally and without much difficulty using a forcep and scissors I was able to remove the nail from the nail bed. Minimal bleeding controlled with pressure. There is no evidence of purulence, exposed bone or cellulitis. Integumentary (Hair,  Skin) No suspicious lesions. No crepitus or fluctuance. No peri-wound warmth or erythema. No masses.. Wound #1 status is Open. Original cause of wound was Gradually Appeared. The wound is located on the Left Toe Great. The wound measures 0.3cm length x 1cm width x 0.2cm depth; 0.236cm^2 area and 0.047cm^3 volume. There is no tunneling or undermining noted. There is a large amount of purulent drainage noted. The wound margin is distinct with the outline attached to the wound base. There is no granulation within the wound bed. There is a large (67-100%) amount of necrotic tissue within the wound bed including Adherent Slough. Periwound temperature was noted as No Abnormality. Assessment Active Problems ICD-10 L97.521 - Non-pressure chronic ulcer of other part of left foot limited to breakdown of skin L60.0 - Ingrowing nail William Solis, William Solis. ([id number]) A fairly acute problem with the left big toe nail possibly arising from an ingrown toenail with chronic previous fungal infection. after removing the toenail the base of the nail bed is looking clean and there is no evidence for cellulitis of exposed bone and I have recommended William alginate dressing to be changed every other day. I will  also get an x-ray of the foot to review the underlying bone If the patient has problems over the weekend he is to report to the ER immediately If not we will see him back next week Procedures Wound #1 Wound #1 is an Infection - not elsewhere classified located on the Left Toe Great . An AVULSION NAIL, SIMPLE/SINGLE procedure was performed by Christin Fudge, MD. Post procedure Diagnosis Wound #1: Same as Pre-Procedure Plan Wound Cleansing: Wound #1 Left Toe Great: Clean wound with Normal Saline. Cleanse wound with mild soap and water Anesthetic: Wound #1 Left Toe Great: Topical Lidocaine 4% cream applied to wound bed prior to debridement Primary Wound Dressing: Wound #1 Left Toe Great: Aquacel  Ag Secondary Dressing: Wound #1 Left Toe Great: Dry Gauze Conform/Kerlix Dressing Change Frequency: Wound #1 Left Toe Great: Change dressing every other day. Follow-up Appointments: Wound #1 Left Toe Great: Return Appointment in 1 week. Additional Orders / Instructions: Wound #1 Left Toe Great: Increase protein intake. William Solis, William Solis ([id number]) A fairly acute problem with the left big toe nail possibly arising from an ingrown toenail with chronic previous fungal infection. after removing the toenail the base of the nail bed is looking clean and there is no evidence for cellulitis of exposed bone and I have recommended William alginate dressing to be changed every other day. I will also get an x-ray of the foot to review the underlying bone If the patient has problems over the weekend he is to report to the ER immediately If not we will see him back next week Electronic Signature(s) Signed: 05/07/2016 10:40:28 AM By: Gretta Cool, RN, BSN, Kim RN, BSN Signed: 05/20/2016 12:43:23 PM By: Christin Fudge MD, FACS Previous Signature: 03/14/2016 4:25:11 PM Version By: Christin Fudge MD, FACS Entered By: Gretta Cool, RN, BSN, Kim on 05/07/2016 10:40:27 William Solis ([id number]) -------------------------------------------------------------------------------- ROS/PFSH Details Patient Name: William Solis Date of Service: 03/14/2016 3:15 PM Medical Record Number: [id number] Patient Account Number: [id number] Date of Birth/Sex: [birth year] (66 y.o. Male) Treating RN: Carolyne Fiscal, Debi Primary Care Provider: Billey Chang Other Clinician: Referring Provider: Jonni Sanger, CAMILLE Treating Provider/Extender: Frann Rider in Treatment: 0 Information Obtained From Patient Wound History Do you currently have one or more open woundso Yes How many open wounds do you currently haveo 1 Approximately how long have you had your woundso 2 days How have you been treating your wound(s) until nowo bactroban  and hydrogen peroxide 2 x a day Has your wound(s) ever healed and then re-openedo No Have you had any lab work done in the past montho No Have you tested positive for an antibiotic resistant organism No (MRSA, VRE)o Have you tested positive for osteomyelitis (bone infection)o Yes Have you had any tests for circulation on your legso No Have you had other problems associated with your woundso Swelling Eyes Complaints and Symptoms: Positive for: Glasses / Contacts Medical History: Positive for: Cataracts Endocrine Complaints and Symptoms: Positive for: Thyroid disease - parathyroid x1 removed Psychiatric Complaints and Symptoms: Positive for: Anxiety Review of System Notes: depression Constitutional Symptoms (General Health) Complaints and Symptoms: No Complaints or Symptoms Ear/Nose/Mouth/Throat William Solis, William Solis ([id number]) Complaints and Symptoms: No Complaints or Symptoms Hematologic/Lymphatic Complaints and Symptoms: No Complaints or Symptoms Respiratory Complaints and Symptoms: No Complaints or Symptoms Cardiovascular Medical History: Positive for: Hypertension Gastrointestinal Complaints and Symptoms: No Complaints or Symptoms Genitourinary Complaints and Symptoms: No Complaints or Symptoms Immunological Complaints and Symptoms: No Complaints or Symptoms Integumentary (Skin) Complaints and Symptoms: No Complaints or Symptoms  Musculoskeletal Medical History: Positive for: Gout; Osteoarthritis Neurologic Medical History: Positive for: Neuropathy Oncologic Complaints and Symptoms: No Complaints or Symptoms William Solis, William Solis. ([id number]) HBO Extended History Items Eyes: Cataracts Immunizations Pneumococcal Vaccine: Received Pneumococcal Vaccination: Yes Family and Social History Cancer: No; Diabetes: No; Former smoker - quit 1992; Marital Status - Married; Alcohol Use: Never; Drug Use: No History; Caffeine Use: Daily; Financial Concerns: No;  Food, Clothing or Shelter Needs: No; Support System Lacking: No; Transportation Concerns: No; Advanced Directives: No; Patient does not want information on Advanced Directives; Do not resuscitate: No; Living Will: Yes (Not Provided); Medical Power of Attorney: No Physician Affirmation I have reviewed and agree with the above information. Electronic Signature(s) Signed: 03/14/2016 4:26:35 PM By: Christin Fudge MD, FACS Signed: 03/14/2016 4:53:33 PM By: Alric Quan Entered By: Christin Fudge on 03/14/2016 15:35:06 William Solis ([id number]) -------------------------------------------------------------------------------- SuperBill Details Patient Name: William Solis Date of Service: 03/14/2016 Medical Record Number: [id number] Patient Account Number: [id number] Date of Birth/Sex: 11/13/1950 (66 y.o. Male) Treating RN: Ahmed Prima Primary Care Provider: Billey Chang Other Clinician: Referring Provider: Billey Chang Treating Provider/Extender: Frann Rider in Treatment: 0 Diagnosis Coding ICD-10 Codes Code Description L97.521 Non-pressure chronic ulcer of other part of left foot limited to breakdown of skin L60.0 Ingrowing nail Facility Procedures CPT4: Description Modifier Quantity Code [id number] 99213 - WOUND CARE VISIT-LEV 3 EST PT 1 CPT4: [id number] 11730 - AVULSION NAIL, SIMPLE/SINGLE 1 ICD-10 Description Diagnosis L97.521 Non-pressure chronic ulcer of other part of left foot limited to breakdown of skin L60.0 Ingrowing nail Physician Procedures CPT4: Description Modifier Quantity Code [id number] 81829 - WC PHYS LEVEL 4 - NEW PT 25 1 ICD-10 Description Diagnosis L97.521 Non-pressure chronic ulcer of other part of left foot limited to breakdown of skin L60.0 Ingrowing nail CPT4: [id number] 11730 - WC PHYS AVULSION NAILPLATE SIM/SING 1 VEL-38 Description Diagnosis L97.521 Non-pressure chronic ulcer of other part of left foot limited to breakdown of skin L60.0 Ingrowing  nail William Solis, William Solis ([id number]) Electronic Signature(s) Signed: 03/14/2016 4:31:30 PM By: Christin Fudge MD, FACS Signed: 03/14/2016 4:53:33 PM By: Alric Quan Previous Signature: 03/14/2016 4:26:13 PM Version By: Christin Fudge MD, FACS Entered By: Alric Quan on 03/14/2016 16:27:11

## 2016-03-21 ENCOUNTER — Encounter: Payer: BLUE CROSS/BLUE SHIELD | Attending: Surgery | Admitting: Surgery

## 2016-03-21 DIAGNOSIS — G4733 Obstructive sleep apnea (adult) (pediatric): Secondary | ICD-10-CM | POA: Diagnosis not present

## 2016-03-21 DIAGNOSIS — G629 Polyneuropathy, unspecified: Secondary | ICD-10-CM | POA: Diagnosis not present

## 2016-03-21 DIAGNOSIS — M109 Gout, unspecified: Secondary | ICD-10-CM | POA: Insufficient documentation

## 2016-03-21 DIAGNOSIS — F329 Major depressive disorder, single episode, unspecified: Secondary | ICD-10-CM | POA: Diagnosis not present

## 2016-03-21 DIAGNOSIS — Z79899 Other long term (current) drug therapy: Secondary | ICD-10-CM | POA: Diagnosis not present

## 2016-03-21 DIAGNOSIS — I10 Essential (primary) hypertension: Secondary | ICD-10-CM | POA: Insufficient documentation

## 2016-03-21 DIAGNOSIS — Z87891 Personal history of nicotine dependence: Secondary | ICD-10-CM | POA: Insufficient documentation

## 2016-03-21 DIAGNOSIS — L600 Ingrowing nail: Secondary | ICD-10-CM | POA: Diagnosis not present

## 2016-03-21 DIAGNOSIS — F419 Anxiety disorder, unspecified: Secondary | ICD-10-CM | POA: Diagnosis not present

## 2016-03-21 DIAGNOSIS — L97521 Non-pressure chronic ulcer of other part of left foot limited to breakdown of skin: Secondary | ICD-10-CM | POA: Diagnosis not present

## 2016-03-21 DIAGNOSIS — M1990 Unspecified osteoarthritis, unspecified site: Secondary | ICD-10-CM | POA: Diagnosis not present

## 2016-03-22 NOTE — Progress Notes (Signed)
SHLOMA, ROGGENKAMP ([id number]) Visit Report for 03/21/2016 Arrival Information Details Patient Name: William Solis, William Solis. Date of Service: 03/21/2016 2:15 PM Medical Record Number: [id number] Patient Account Number: [id number] Date of Birth/Sex: 04-01-1950 (66 y.o. Male) Treating RN: Baruch Gouty, RN, BSN, Velva Harman Primary Care Rashed Edler: Billey Chang Other Clinician: Referring Denisa Enterline: Billey Chang Treating Jamilet Ambroise/Extender: Frann Rider in Treatment: 1 Visit Information History Since Last Visit All ordered tests and consults were completed: No Patient Arrived: Ambulatory Added or deleted any medications: No Arrival Time: 14:17 Any new allergies or adverse reactions: No Accompanied By: wife Had a fall or experienced change in No Transfer Assistance: None activities of daily living that may affect Patient Identification Verified: Yes risk of falls: Secondary Verification Process Yes Signs or symptoms of abuse/neglect since last No Completed: visito Patient Requires Transmission-Based No Hospitalized since last visit: No Precautions: Has Dressing in Place as Prescribed: Yes Patient Has Alerts: Yes Pain Present Now: No Patient Alerts: + Hep C Electronic Signature(s) Signed: 03/21/2016 3:07:45 PM By: Regan Lemming BSN, RN Entered By: Regan Lemming on 03/21/2016 14:17:36 William Solis ([id number]) -------------------------------------------------------------------------------- Clinic Level of Care Assessment Details Patient Name: William Solis Date of Service: 03/21/2016 2:15 PM Medical Record Number: [id number] Patient Account Number: [id number] Date of Birth/Sex: Dec 16, 1950 (66 y.o. Male) Treating RN: Afful, RN, BSN, Administrator, sports Primary Care Khari Mally: Billey Chang Other Clinician: Referring Azel Gumina: Jonni Sanger, CAMILLE Treating Oaklie Durrett/Extender: Frann Rider in Treatment: 1 Clinic Level of Care Assessment Items TOOL 4 Quantity Score '[]'$  - Use when only an EandM is performed  on FOLLOW-UP visit 0 ASSESSMENTS - Nursing Assessment / Reassessment X - Reassessment of Co-morbidities (includes updates in patient status) 1 10 X - Reassessment of Adherence to Treatment Plan 1 5 ASSESSMENTS - Wound and Skin Assessment / Reassessment X - Simple Wound Assessment / Reassessment - one wound 1 5 '[]'$  - Complex Wound Assessment / Reassessment - multiple wounds 0 '[]'$  - Dermatologic / Skin Assessment (not related to wound area) 0 ASSESSMENTS - Focused Assessment '[]'$  - Circumferential Edema Measurements - multi extremities 0 '[]'$  - Nutritional Assessment / Counseling / Intervention 0 X - Lower Extremity Assessment (monofilament, tuning fork, pulses) 1 5 '[]'$  - Peripheral Arterial Disease Assessment (using hand held doppler) 0 ASSESSMENTS - Ostomy and/or Continence Assessment and Care '[]'$  - Incontinence Assessment and Management 0 '[]'$  - Ostomy Care Assessment and Management (repouching, etc.) 0 PROCESS - Coordination of Care X - Simple Patient / Family Education for ongoing care 1 15 '[]'$  - Complex (extensive) Patient / Family Education for ongoing care 0 '[]'$  - Staff obtains Programmer, systems, Records, Test Results / Process Orders 0 '[]'$  - Staff telephones HHA, Nursing Homes / Clarify orders / etc 0 '[]'$  - Routine Transfer to another Facility (non-emergent condition) 0 William Solis, William Solis ([id number]) '[]'$  - Routine Hospital Admission (non-emergent condition) 0 '[]'$  - New Admissions / Biomedical engineer / Ordering NPWT, Apligraf, etc. 0 '[]'$  - Emergency Hospital Admission (emergent condition) 0 '[]'$  - Simple Discharge Coordination 0 '[]'$  - Complex (extensive) Discharge Coordination 0 PROCESS - Special Needs '[]'$  - Pediatric / Minor Patient Management 0 '[]'$  - Isolation Patient Management 0 '[]'$  - Hearing / Language / Visual special needs 0 '[]'$  - Assessment of Community assistance (transportation, D/C planning, etc.) 0 '[]'$  - Additional assistance / Altered mentation 0 '[]'$  - Support Surface(s) Assessment (bed,  cushion, seat, etc.) 0 INTERVENTIONS - Wound Cleansing / Measurement X - Simple Wound Cleansing - one wound 1 5 '[]'$  - Complex Wound Cleansing -  multiple wounds 0 X - Wound Imaging (photographs - any number of wounds) 1 5 '[]'$  - Wound Tracing (instead of photographs) 0 X - Simple Wound Measurement - one wound 1 5 '[]'$  - Complex Wound Measurement - multiple wounds 0 INTERVENTIONS - Wound Dressings X - Small Wound Dressing one or multiple wounds 1 10 '[]'$  - Medium Wound Dressing one or multiple wounds 0 '[]'$  - Large Wound Dressing one or multiple wounds 0 '[]'$  - Application of Medications - topical 0 '[]'$  - Application of Medications - injection 0 INTERVENTIONS - Miscellaneous '[]'$  - External ear exam 0 William Solis, William BALBOA. ([id number]) '[]'$  - Specimen Collection (cultures, biopsies, blood, body fluids, etc.) 0 '[]'$  - Specimen(s) / Culture(s) sent or taken to Lab for analysis 0 '[]'$  - Patient Transfer (multiple staff / Harrel Lemon Lift / Similar devices) 0 '[]'$  - Simple Staple / Suture removal (25 or less) 0 '[]'$  - Complex Staple / Suture removal (26 or more) 0 '[]'$  - Hypo / Hyperglycemic Management (close monitor of Blood Glucose) 0 '[]'$  - Ankle / Brachial Index (ABI) - do not check if billed separately 0 X - Vital Signs 1 5 Has the patient been seen at the hospital within the last three years: Yes Total Score: 70 Level Of Care: New/Established - Level 2 Electronic Signature(s) Signed: 03/21/2016 3:07:45 PM By: Regan Lemming BSN, RN Entered By: Regan Lemming on 03/21/2016 14:36:22 William Solis ([id number]) -------------------------------------------------------------------------------- Encounter Discharge Information Details Patient Name: William Solis Date of Service: 03/21/2016 2:15 PM Medical Record Number: [id number] Patient Account Number: [id number] Date of Birth/Sex: 1950-11-29 (66 y.o. Male) Treating RN: Baruch Gouty, RN, BSN, Velva Harman Primary Care Amoree Newlon: Billey Chang Other Clinician: Referring Jacquel Mccamish: Billey Chang Treating Tehya Leath/Extender: Frann Rider in Treatment: 1 Encounter Discharge Information Items Discharge Pain Level: 0 Discharge Condition: Stable Ambulatory Status: Ambulatory Discharge Destination: Home Private Transportation: Auto Accompanied By: wife Schedule Follow-up Appointment: No Medication Reconciliation completed and No provided to Patient/Care Anayi Bricco: Clinical Summary of Care: Electronic Signature(s) Signed: 03/21/2016 3:07:45 PM By: Regan Lemming BSN, RN Entered By: Regan Lemming on 03/21/2016 14:36:50 William Solis ([id number]) -------------------------------------------------------------------------------- Lower Extremity Assessment Details Patient Name: William Solis Date of Service: 03/21/2016 2:15 PM Medical Record Number: [id number] Patient Account Number: [id number] Date of Birth/Sex: 03-Feb-1950 (66 y.o. Male) Treating RN: Baruch Gouty, RN, BSN, Velva Harman Primary Care Eleazar Kimmey: Billey Chang Other Clinician: Referring Nelta Caudill: Jonni Sanger, CAMILLE Treating Malan Werk/Extender: Frann Rider in Treatment: 1 Vascular Assessment Claudication: Claudication Assessment [Left:None] Pulses: Dorsalis Pedis Palpable: [Left:Yes] Posterior Tibial Palpable: [Left:Yes] Extremity colors, hair growth, and conditions: Extremity Color: [Left:Normal] Hair Growth on Extremity: [Left:Yes] Temperature of Extremity: [Left:Warm] Capillary Refill: [Left:< 3 seconds] Toe Nail Assessment Left: Right: Thick: No Discolored: No Deformed: No Improper Length and Hygiene: No Electronic Signature(s) Signed: 03/21/2016 3:07:45 PM By: Regan Lemming BSN, RN Entered By: Regan Lemming on 03/21/2016 14:32:17 William Solis ([id number]) -------------------------------------------------------------------------------- Multi Wound Chart Details Patient Name: William Solis Date of Service: 03/21/2016 2:15 PM Medical Record Number: [id number] Patient Account Number:  [id number] Date of Birth/Sex: 07/13/1950 (66 y.o. Male) Treating RN: Baruch Gouty, RN, BSN, [place] Primary Care Albino Bufford: Billey Chang Other Clinician: Referring Dyson Sevey: Jonni Sanger, CAMILLE Treating Patricia Fargo/Extender: Frann Rider in Treatment: 1 Vital Signs Height(in): 72 Pulse(bpm): 85 Weight(lbs): 244 Blood Pressure 128/79 (mmHg): Body Mass Index(BMI): 33 Temperature(F): 98.2 Respiratory Rate 18 (breaths/min): Photos: [1:No Photos] [N/A:N/A] Wound Location: [1:Left Toe Great] [N/A:N/A] Wounding Event: [1:Gradually Appeared] [N/A:N/A] Primary Etiology: [1:Infection - not elsewhere classified] [N/A:N/A] Date Acquired: [1:03/10/2016] [  N/A:N/A] Weeks of Treatment: [1:1] [N/A:N/A] Wound Status: [1:Open] [N/A:N/A] Measurements L x W x D 0.1x0.1x0.1 [N/A:N/A] (cm) Area (cm) : [0:0.938] [N/A:N/A] Volume (cm) : [1:0.001] [N/A:N/A] % Reduction in Area: [1:96.60%] [N/A:N/A] % Reduction in Volume: 97.90% [N/A:N/A] Classification: [1:Partial Thickness] [N/A:N/A] Periwound Skin Texture: No Abnormalities Noted [N/A:N/A] Periwound Skin [1:No Abnormalities Noted] [N/A:N/A] Moisture: Periwound Skin Color: No Abnormalities Noted [N/A:N/A] Tenderness on [1:No] [N/A:N/A] Treatment Notes Electronic Signature(s) Signed: 03/21/2016 2:34:23 PM By: Christin Fudge MD, FACS Entered By: Christin Fudge on 03/21/2016 14:34:23 William Solis ([id number]) William Solis ([id number]) -------------------------------------------------------------------------------- [place] Details Patient Name: William Solis Date of Service: 03/21/2016 2:15 PM Medical Record Number: [id number] Patient Account Number: [id number] Date of Birth/Sex: 12/24/1950 (66 y.o. Male) Treating RN: Baruch Gouty, RN, BSN, [place] Primary Care Cresencia Asmus: Billey Chang Other Clinician: Referring Durinda Buzzelli: Billey Chang Treating Shem Plemmons/Extender: Frann Rider in Treatment: 1 Active Inactive Electronic  Signature(s) Signed: 03/21/2016 3:07:45 PM By: Regan Lemming BSN, RN Entered By: Regan Lemming on 03/21/2016 14:33:09 William Solis ([id number]) -------------------------------------------------------------------------------- Pain Assessment Details Patient Name: William Solis Date of Service: 03/21/2016 2:15 PM Medical Record Number: [id number] Patient Account Number: [id number] Date of Birth/Sex: 11-13-1950 (66 y.o. Male) Treating RN: Baruch Gouty, RN, BSN, [place] Primary Care Nnenna Meador: Billey Chang Other Clinician: Referring Paysley Poplar: Billey Chang Treating [place] Yarbro/Extender: Frann Rider in Treatment: 1 Active Problems Location of Pain Severity and Description of Pain Patient Has Paino No Site Locations With Dressing Change: No Pain Management and Medication Current Pain Management: Electronic Signature(s) Signed: 03/21/2016 3:07:45 PM By: Regan Lemming BSN, RN Entered By: Regan Lemming on 03/21/2016 14:20:15 William Solis ([id number]) -------------------------------------------------------------------------------- Patient/Caregiver Education Details Patient Name: William Solis Date of Service: 03/21/2016 2:15 PM Medical Record Number: [id number] Patient Account Number: [id number] Date of Birth/Gender: 1950/06/30 (66 y.o. Male) Treating RN: Baruch Gouty, RN, BSN, [place] Primary Care Physician: Billey Chang Other Clinician: Referring Physician: Billey Chang Treating Physician/Extender: Frann Rider in Treatment: 1 Education Assessment Education Provided To: Patient Education Topics Provided Basic Hygiene: Methods: Explain/Verbal Responses: State content correctly Electronic Signature(s) Signed: 03/21/2016 3:07:45 PM By: Regan Lemming BSN, RN Entered By: Regan Lemming on 03/21/2016 14:37:00 William Solis ([id number]) -------------------------------------------------------------------------------- Wound Assessment Details Patient Name: William Solis Date of  Service: 03/21/2016 2:15 PM Medical Record Number: [id number] Patient Account Number: [id number] Date of Birth/Sex: 1950/12/29 (66 y.o. Male) Treating RN: Afful, RN, BSN, [place] Primary Care Skye Plamondon: Billey Chang Other Clinician: Referring Maleik Vanderzee: Jonni Sanger, CAMILLE Treating Robertta Halfhill/Extender: Frann Rider in Treatment: 1 Wound Status Wound Number: 1 Primary Etiology: Infection - not elsewhere classified Wound Location: Left Toe Great Wound Status: Healed - Epithelialized Wounding Event: Gradually Appeared Date Acquired: 03/10/2016 Weeks Of Treatment: 1 Clustered Wound: No Photos Photo Uploaded By: Regan Lemming on 03/21/2016 15:07:32 Wound Measurements Length: (cm) 0 Width: (cm) 0 Depth: (cm) 0 Area: (cm) 0 Volume: (cm) 0 % Reduction in Area: 100% % Reduction in Volume: 100% Wound Description Classification: Partial Thickness Periwound Skin Texture Texture Color No Abnormalities Noted: No No Abnormalities Noted: No William Solis, DIIORIO. ([id number]) Moisture No Abnormalities Noted: No Electronic Signature(s) Signed: 03/21/2016 3:07:45 PM By: Regan Lemming BSN, RN Entered By: Regan Lemming on 03/21/2016 14:37:13 William Solis ([id number]) -------------------------------------------------------------------------------- Vitals Details Patient Name: William Solis Date of Service: 03/21/2016 2:15 PM Medical Record Number: [id number] Patient Account Number: [id number] Date of Birth/Sex: 1950/10/09 (66 y.o. Male) Treating RN: Baruch Gouty, RN, BSN, [place] Primary Care Jaleia Hanke: Billey Chang Other Clinician: Referring Trixy Loyola: Jonni Sanger, CAMILLE  Treating Adri Schloss/Extender: Frann Rider in Treatment: 1 Vital Signs Time Taken: 14:20 Temperature (F): 98.2 Height (in): 72 Pulse (bpm): 85 Weight (lbs): 244 Respiratory Rate (breaths/min): 18 Body Mass Index (BMI): 33.1 Blood Pressure (mmHg): 128/79 Reference Range: 80 - 120 mg / dl Electronic Signature(s) Signed:  03/21/2016 3:07:45 PM By: Regan Lemming BSN, RN Entered By: Regan Lemming on 03/21/2016 14:21:56

## 2016-03-22 NOTE — Progress Notes (Addendum)
ROURKE, MCQUITTY ([id number]) Visit Report for 03/21/2016 Chief Complaint Document Details Patient Name: William Solis, William Solis. Date of Service: 03/21/2016 2:15 PM Medical Record Number: [id number] Patient Account Number: [id number] Date of Birth/Sex: 1950/08/21 (66 y.o. Male) Treating RN: Baruch Gouty, RN, BSN, Velva Harman Primary Care Provider: Billey Chang Other Clinician: Referring Provider: Billey Chang Treating Provider/Extender: Frann Rider in Treatment: 1 Information Obtained from: Patient Chief Complaint Patient presents to the wound care center for a consult due non healing wound to the left big toe for 2 days Electronic Signature(s) Signed: 03/21/2016 2:34:28 PM By: Christin Fudge MD, FACS Entered By: Christin Fudge on 03/21/2016 14:34:28 William Solis ([id number]) -------------------------------------------------------------------------------- HPI Details Patient Name: William Solis Date of Service: 03/21/2016 2:15 PM Medical Record Number: [id number] Patient Account Number: [id number] Date of Birth/Sex: 1950/10/17 (66 y.o. Male) Treating RN: Baruch Gouty, RN, BSN, Velva Harman Primary Care Provider: Billey Chang Other Clinician: Referring Provider: Billey Chang Treating Provider/Extender: Frann Rider in Treatment: 1 History of Present Illness Location: discharge and pain around the nailbed of the left big toe Quality: Patient reports experiencing a dull pain to affected area(s). Severity: Patient states wound are getting worse. Duration: Patient has had the wound for < 2 days prior to presenting for treatment Timing: Pain in wound is constant (hurts all the time) Context: The wound appeared gradually over time Modifying Factors: Consults to this date include:seen the podiatrist a day before this happened Associated Signs and Symptoms: Patient reports having increase discharge. HPI Description: 66 year old male seen recently by his podiatrist on 03/11/2016 and treated for a  left foot ulcer with local care and debridement. 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 Patient says he has had no fever or any other symptoms Patient has a history of recurring ulcer under the third metatarsal of the left foot and has a history of osteomyelitis and peripheral neuropathy in the past. he status post second toe amputation and third toe flexor tenotomy on his left foot. He also has a history of peripheral neuropathy following back surgery 11 years ago. he also has a past medical history of hypertension, chronic low back pain, anxiety with depression, hepatitis C, osteomyelitis of the left foot in October 2015 and had a amputation of the left second toe by Dr. Sharol Given in 2015. status post spinal fusion and parathyroidectomy. He has never been a smoker. He has seen his pulmonologist for obstructive sleep apnea on CPAP, shortness of breath and cough. 03/21/2016 -- had an x-ray of the left foot -- IMPRESSION:Soft tissue swelling in region of first MTP joint. No radiographic evidence of osteomyelitis or soft tissue gas. Dorsal dislocation of middle toe at third MCP joint. No acute fracture identified. Electronic Signature(s) Signed: 03/21/2016 2:34:33 PM By: Christin Fudge MD, FACS Previous Signature: 03/21/2016 2:34:10 PM Version By: Christin Fudge MD, FACS Previous Signature: 03/21/2016 2:30:11 PM Version By: Christin Fudge MD, FACS Entered By: Christin Fudge on 03/21/2016 14:34:33 William Solis ([id number]) -------------------------------------------------------------------------------- Physical Exam Details Patient Name: William Solis Date of Service: 03/21/2016 2:15 PM Medical Record Number: [id number] Patient Account Number: [id number] Date of Birth/Sex: 05/11/1950 (66 y.o. Male) Treating RN: Baruch Gouty, RN, BSN,  Velva Harman Primary Care Provider: Billey Chang Other Clinician: Referring Provider: Jonni Sanger, CAMILLE Treating Provider/Extender: Frann Rider in Treatment: 1 Constitutional . Pulse regular. Respirations normal and unlabored. Afebrile. Marland Kitchen  Eyes Nonicteric. Reactive to light. Ears, Nose, Mouth, and Throat Lips, teeth, and gums WNL.Marland Kitchen Moist mucosa without lesions. Neck supple and nontender. No palpable supraclavicular or cervical adenopathy. Normal sized without goiter. Respiratory WNL. No retractions.. Breath sounds WNL, No rubs, rales, rhonchi, or wheeze.. Cardiovascular Heart rhythm and rate regular, no murmur or gallop.. Pedal Pulses WNL. No clubbing, cyanosis or edema. Chest Breasts symmetical and no nipple discharge.. Breast tissue WNL, no masses, lumps, or tenderness.. Gastrointestinal (GI) Abdomen without masses or tenderness.. No liver or spleen enlargement or tenderness.. Genitourinary (GU) No hydrocele, spermatocele, tenderness of the cord, or testicular mass.Marland Kitchen Penis without lesions.Lowella Fairy without lesions. No cystocele, or rectocele. Pelvic support intact, no discharge.Marland Kitchen Urethra without masses, tenderness or scarring.Marland Kitchen Lymphatic No adneopathy. No adenopathy. No adenopathy. Musculoskeletal Adexa without tenderness or enlargement.. Digits and nails w/o clubbing, cyanosis, infection, petechiae, ischemia, or inflammatory conditions.. Integumentary (Hair, Skin) No suspicious lesions. No crepitus or fluctuance. No peri-wound warmth or erythema. No masses.Marland Kitchen Psychiatric Judgement and insight Intact.. No evidence of depression, anxiety, or agitation.. Notes wound looks excellent and there is no open ulceration or evidence of cellulitis. Electronic Signature(s) Signed: 03/21/2016 2:35:04 PM By: Christin Fudge MD, FACS William Solis ([id number]) Entered By: Christin Fudge on 03/21/2016 14:35:03 William Solis  ([id number]) -------------------------------------------------------------------------------- Physician Orders Details Patient Name: William Solis Date of Service: 03/21/2016 2:15 PM Medical Record Number: [id number] Patient Account Number: [id number] Date of Birth/Sex: [birth year] (66 y.o. Male) Treating RN: Baruch Gouty, RN, BSN, Velva Harman Primary Care Provider: Billey Chang Other Clinician: Referring Provider: Billey Chang Treating Provider/Extender: Frann Rider in Treatment: 1 Verbal / Phone Orders: No Diagnosis Coding Discharge From [facility] Services o Discharge from [facility] Completed Electronic Signature(s) Signed: 03/21/2016 3:07:45 PM By: Regan Lemming BSN, RN Signed: 03/21/2016 3:38:42 PM By: Christin Fudge MD, FACS Entered By: Regan Lemming on 03/21/2016 14:32:51 William Solis ([id number]) -------------------------------------------------------------------------------- Problem List Details Patient Name: William Solis Date of Service: 03/21/2016 2:15 PM Medical Record Number: [id number] Patient Account Number: [id number] Date of Birth/Sex: [birth year] (66 y.o. Male) Treating RN: Baruch Gouty, RN, BSN, Velva Harman Primary Care Provider: Billey Chang Other Clinician: Referring Provider: Jonni Sanger, CAMILLE Treating Provider/Extender: Frann Rider in Treatment: 1 Active Problems ICD-10 Encounter Code Description Active Date Diagnosis L97.521 Non-pressure chronic ulcer of other part of left foot limited 03/14/2016 Yes to breakdown of skin L60.0 Ingrowing nail 03/14/2016 Yes Inactive Problems Resolved Problems Electronic Signature(s) Signed: 03/21/2016 2:34:20 PM By: Christin Fudge MD, FACS Entered By: Christin Fudge on 03/21/2016 14:34:20 William Solis ([id number]) -------------------------------------------------------------------------------- Progress Note Details Patient Name: William Solis Date of Service: 03/21/2016 2:15 PM Medical Record Number:  [id number] Patient Account Number: [id number] Date of Birth/Sex: Jun 21, 1950 (66 y.o. Male) Treating RN: Baruch Gouty, RN, BSN, Velva Harman Primary Care Provider: Billey Chang Other Clinician: Referring Provider: Jonni Sanger, CAMILLE Treating Provider/Extender: Frann Rider in Treatment: 1 Subjective Chief Complaint Information obtained from Patient Patient presents to the wound care center for a consult due non healing wound to the left big toe for 2 days History of Present Illness (HPI) The following HPI elements were documented for the patient's wound: Location: discharge and pain around the nailbed of the left big toe Quality: Patient reports experiencing a dull pain to affected area(s). Severity: Patient states wound are getting worse. Duration: Patient has had the wound for < 2 days prior to presenting for treatment Timing: Pain in wound is constant (hurts all the time) Context: The wound appeared gradually over time  Modifying Factors: Consults to this date include:seen the podiatrist a day before this happened Associated Signs and Symptoms: Patient reports having increase discharge. 66 year old male seen recently by his podiatrist on 03/11/2016 and treated for a left foot ulcer with local care and debridement. 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 Patient says he has had no fever or any other symptoms Patient has a history of recurring ulcer under the third metatarsal of the left foot and has a history of osteomyelitis and peripheral neuropathy in the past. he status post second toe amputation and third toe flexor tenotomy on his left foot. He also has a history of peripheral neuropathy following back surgery 11 years ago. he also has a past medical history of hypertension, chronic low back pain, anxiety with depression, hepatitis  C, osteomyelitis of the left foot in October 2015 and had a amputation of the left second toe by Dr. Sharol Given in 2015. status post spinal fusion and parathyroidectomy. He has never been a smoker. He has seen his pulmonologist for obstructive sleep apnea on CPAP, shortness of breath and cough. 03/21/2016 -- had an x-ray of the left foot -- IMPRESSION:Soft tissue swelling in region of first MTP joint. No radiographic evidence of osteomyelitis or soft tissue gas. Dorsal dislocation of middle toe at third MCP joint. No acute fracture identified. William Solis, William Solis. ([id number]) Objective Constitutional Pulse regular. Respirations normal and unlabored. Afebrile. Vitals Time Taken: 2:20 PM, Height: 72 in, Weight: 244 lbs, BMI: 33.1, Temperature: 98.2 F, Pulse: 85 bpm, Respiratory Rate: 18 breaths/min, Blood Pressure: 128/79 mmHg. Eyes Nonicteric. Reactive to light. Ears, Nose, Mouth, and Throat Lips, teeth, and gums WNL.Marland Kitchen Moist mucosa without lesions. Neck supple and nontender. No palpable supraclavicular or cervical adenopathy. Normal sized without goiter. Respiratory WNL. No retractions.. Breath sounds WNL, No rubs, rales, rhonchi, or wheeze.. Cardiovascular Heart rhythm and rate regular, no murmur or gallop.. Pedal Pulses WNL. No clubbing, cyanosis or edema. Chest Breasts symmetical and no nipple discharge.. Breast tissue WNL, no masses, lumps, or tenderness.. Gastrointestinal (GI) Abdomen without masses or tenderness.. No liver or spleen enlargement or tenderness.. Genitourinary (GU) No hydrocele, spermatocele, tenderness of the cord, or testicular mass.Marland Kitchen Penis without lesions.Lowella Fairy without lesions. No cystocele, or rectocele. Pelvic support intact, no discharge.Marland Kitchen Urethra without masses, tenderness or scarring.Marland Kitchen Lymphatic No adneopathy. No adenopathy. No adenopathy. Musculoskeletal Adexa without tenderness or enlargement.. Digits and nails w/o clubbing, cyanosis, infection,  petechiae, ischemia, or inflammatory conditions.Marland Kitchen Psychiatric Judgement and insight Intact.. No evidence of depression, anxiety, or agitation.. General Notes: wound looks excellent and there is no open ulceration or evidence of cellulitis. Integumentary (Hair, Skin) William Solis, William Solis. ([id number]) No suspicious lesions. No crepitus or fluctuance. No peri-wound warmth or erythema. No masses.. Wound #1 status is Healed - Epithelialized. Original cause of wound was Gradually Appeared. The wound is located on the Left Toe Great. The wound measures 0cm length x 0cm width x 0cm depth; 0cm^2 area and 0cm^3 volume. Assessment Active Problems ICD-10 L97.521 - Non-pressure chronic ulcer of other part of left foot limited to breakdown of skin L60.0 - Ingrowing nail Plan Discharge From [facility] Services: Discharge from [facility] Completed The wound is healed. I will discuss the results of his x-rays and at this stage have asked him to use symptomatic treatment and protect the nail  bed. He is discharged from the wound care services and will be seen back only if needed Electronic Signature(s) Signed: 03/21/2016 3:43:25 PM By: Christin Fudge MD, FACS Previous Signature: 03/21/2016 2:35:42 PM Version By: Christin Fudge MD, FACS Entered By: Christin Fudge on 03/21/2016 15:43:25 William Solis ([id number]) -------------------------------------------------------------------------------- SuperBill Details Patient Name: William Solis Date of Service: 03/21/2016 Medical Record Number: [id number] Patient Account Number: [id number] Date of Birth/Sex: 1950/03/21 (66 y.o. Male) Treating RN: Baruch Gouty, RN, BSN, Velva Harman Primary Care Provider: Billey Chang Other Clinician: Referring Provider: Billey Chang Treating Provider/Extender: Christin Fudge Service Line: Outpatient Weeks in Treatment: 1 Diagnosis Coding ICD-10 Codes Code Description [phone] Non-pressure chronic ulcer of other part of  left foot limited to breakdown of skin L60.0 Ingrowing nail Facility Procedures CPT4 Code: [id number] Description: 25638 - WOUND CARE VISIT-LEV 2 EST PT Modifier: Quantity: 1 Physician Procedures CPT4: Description Modifier Quantity Code [id number] 76811 - WC PHYS LEVEL 3 - EST PT 1 ICD-10 Description Diagnosis L97.521 Non-pressure 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

## 2016-04-01 ENCOUNTER — Ambulatory Visit (INDEPENDENT_AMBULATORY_CARE_PROVIDER_SITE_OTHER): Payer: BLUE CROSS/BLUE SHIELD | Admitting: Podiatry

## 2016-04-01 ENCOUNTER — Encounter: Payer: Self-pay | Admitting: Podiatry

## 2016-04-01 DIAGNOSIS — G5793 Unspecified mononeuropathy of bilateral lower limbs: Secondary | ICD-10-CM | POA: Diagnosis not present

## 2016-04-01 DIAGNOSIS — L97522 Non-pressure chronic ulcer of other part of left foot with fat layer exposed: Secondary | ICD-10-CM | POA: Diagnosis not present

## 2016-04-01 DIAGNOSIS — M21962 Unspecified acquired deformity of left lower leg: Secondary | ICD-10-CM

## 2016-04-01 NOTE — Patient Instructions (Signed)
Follow up on left foot ulcer. Healing in progress. Follow instruction using aperture pad. Return in 3 weeks.

## 2016-04-01 NOTE — Progress Notes (Signed)
Subjective: 66year old male presents for follow up on left foot ulcer. Been doing well using dispensed pad.  HPI: Hx of recurring ulcer under 3rd MPJ left foot. Last open lesion was on right foot and been healed. He has history of osteomyelitis. Diagnosed with peripheral neuropathy. S/P loss of 2nd ray, toe and metatarsal left. Had 3rd toe flexor tenotomy left foot that helped him some.   Objective: Left foot: Bleeding callus with dry base under the 3rd MPJ area left foot. No visible drainage noted. No edema or associated erythema noted bilateral. Severe 2nd and 3rd toe contracture left. Vascular status are within normal. Loss of protective sensory perception both feet.  Assessment: Peripheral neuropathy following back surgery 11 years ago. Hx of Osteomyelitis with amputation 2nd ray left. On going ulcerating callusunder 3rd MPJ left foot.  Dry base with no drainage.  Plan: Left foot lesion debrided.  Aperture pad dispensed x 3 with instruction. Return in 3weeks.

## 2016-04-22 ENCOUNTER — Ambulatory Visit: Payer: BLUE CROSS/BLUE SHIELD | Admitting: Podiatry

## 2016-04-29 ENCOUNTER — Encounter: Payer: Self-pay | Admitting: Podiatry

## 2016-04-29 ENCOUNTER — Ambulatory Visit (INDEPENDENT_AMBULATORY_CARE_PROVIDER_SITE_OTHER): Payer: BLUE CROSS/BLUE SHIELD | Admitting: Podiatry

## 2016-04-29 DIAGNOSIS — G5793 Unspecified mononeuropathy of bilateral lower limbs: Secondary | ICD-10-CM | POA: Diagnosis not present

## 2016-04-29 DIAGNOSIS — M21962 Unspecified acquired deformity of left lower leg: Secondary | ICD-10-CM

## 2016-04-29 DIAGNOSIS — L97521 Non-pressure chronic ulcer of other part of left foot limited to breakdown of skin: Secondary | ICD-10-CM | POA: Diagnosis not present

## 2016-04-29 NOTE — Progress Notes (Signed)
Subjective: 66year old male presents for follow up on left foot ulcer. Been doing well using dispensed pad. Had been sick with stomach virus last week.   HPI: Hx of recurring ulcer under 3rd MPJ left foot. Last open lesion was on right foot and been healed. He has history of osteomyelitis. Diagnosed with peripheral neuropathy. S/P loss of 2nd ray, toe and metatarsal left. Had 3rd toe flexor tenotomy left foot that helped him some.   Objective: Left foot: Bleeding callus with dry base under the 3rd MPJ area left foot. No visible drainage noted. No edema or associated erythema noted bilateral. Severe 2nd and 3rd toe contracture left. Vascular status are within normal. Loss of protective sensory perception both feet.  Assessment: Peripheral neuropathy following back surgery 11 years ago. Hx of Osteomyelitis with amputation 2nd ray left. On going ulcerating callusunder 3rd MPJ left foot.  Dry base with no drainage.  Plan: Left foot lesion debrided.  Aperture pad dispensed x 3 with instruction. Return in 4weeks.

## 2016-04-29 NOTE — Patient Instructions (Signed)
Lesion debrided and padded. Home care instruction with extra pad dispensed. Return in one month.

## 2016-05-29 ENCOUNTER — Ambulatory Visit: Payer: BLUE CROSS/BLUE SHIELD | Admitting: Podiatry

## 2016-06-05 ENCOUNTER — Encounter: Payer: Self-pay | Admitting: Podiatry

## 2016-06-05 ENCOUNTER — Ambulatory Visit (INDEPENDENT_AMBULATORY_CARE_PROVIDER_SITE_OTHER): Payer: BLUE CROSS/BLUE SHIELD | Admitting: Podiatry

## 2016-06-05 DIAGNOSIS — G5793 Unspecified mononeuropathy of bilateral lower limbs: Secondary | ICD-10-CM | POA: Diagnosis not present

## 2016-06-05 DIAGNOSIS — M21962 Unspecified acquired deformity of left lower leg: Secondary | ICD-10-CM | POA: Diagnosis not present

## 2016-06-05 DIAGNOSIS — L97501 Non-pressure chronic ulcer of other part of unspecified foot limited to breakdown of skin: Secondary | ICD-10-CM

## 2016-06-05 NOTE — Patient Instructions (Signed)
Follow up on left foot lesion. Improvement noted. Continue with padding. Return as needed.

## 2016-06-05 NOTE — Progress Notes (Signed)
Subjective: 66year old male presents for follow up on left foot ulcer. Been doing well using dispensed pad. Lost job last week.   HPI: Hx of recurring ulcer under 3rd MPJ left foot. Last open lesion was on right foot and been healed. He has history of osteomyelitis. Diagnosed with peripheral neuropathy. S/P loss of 2nd ray, toe and metatarsal left. Had 3rd toe flexor tenotomy left foot that helped him some.   Objective: Left foot: Bleeding callus with dry base under the 3rd MPJ area left foot. No visible drainage noted. No edema or associated erythema noted bilateral. Severe 2nd and 3rd toe contracture left. Vascular status are within normal. Loss of protective sensory perception both feet.  Assessment: Peripheral neuropathy following back surgery 11 years ago. Hx of Osteomyelitis with amputation 2nd ray left. Healed ulcerating callusunder 3rd MPJ left foot.  Dry base with no drainage.  Plan: Left foot lesion debrided and padded.  Home care instruction given. Patient will return as needed.

## 2016-06-25 DIAGNOSIS — Z9181 History of falling: Secondary | ICD-10-CM | POA: Insufficient documentation

## 2016-06-25 DIAGNOSIS — M79673 Pain in unspecified foot: Secondary | ICD-10-CM

## 2016-08-29 DIAGNOSIS — M79673 Pain in unspecified foot: Secondary | ICD-10-CM

## 2016-09-15 DIAGNOSIS — M797 Fibromyalgia: Secondary | ICD-10-CM | POA: Insufficient documentation

## 2016-09-16 ENCOUNTER — Encounter: Payer: Self-pay | Admitting: Podiatry

## 2016-09-16 ENCOUNTER — Ambulatory Visit (INDEPENDENT_AMBULATORY_CARE_PROVIDER_SITE_OTHER): Payer: BLUE CROSS/BLUE SHIELD | Admitting: Podiatry

## 2016-09-16 VITALS — BP 122/70 | HR 70

## 2016-09-16 DIAGNOSIS — L97521 Non-pressure chronic ulcer of other part of left foot limited to breakdown of skin: Secondary | ICD-10-CM | POA: Diagnosis not present

## 2016-09-16 DIAGNOSIS — M21962 Unspecified acquired deformity of left lower leg: Secondary | ICD-10-CM

## 2016-09-16 DIAGNOSIS — G5793 Unspecified mononeuropathy of bilateral lower limbs: Secondary | ICD-10-CM

## 2016-09-16 NOTE — Progress Notes (Signed)
Subjective: 66year old male presents with bandaged great toes both feet. Stated that after mowing lawn he noted his left toe oozing through socks. He was seen by his PCP and was placed on antibiotics prior to this visit.  Been using felt pad that was dispensed previously. Unemployed at this time.  HPI: Hx of recurring ulcer under 3rd MPJ left foot. Last open lesion was on right foot and been healed. He has history of osteomyelitis. Diagnosed with peripheral neuropathy. S/P loss of 2nd ray, toe and metatarsal left. Had 3rd toe flexor tenotomy left foot that helped him some.   Objective: Left foot: Macerated plantar skin under left great toe about 3 cm in diameter with loss of skin, superficial.  Right foot: Small dry abrasion right great toe plantar medial surface, dry. No edema or associated erythema noted bilateral. Severe 2nd and 3rd toe contracture left. Vascular status are within normal. Loss of protective sensory perception both feet.  Assessment: Peripheral neuropathy following back surgery 11 years ago. Hx of Osteomyelitis with amputation 2nd ray left. New ulcer left great toe plantar, limited to breakdown of skin.  Plan: Modified surgical shoe to off load left great toe dispensed with instruction. Left great toe dressed with Amerigel ointment with instruction. Right great toe placed with aperture pad with instruction. Home care instruction given. Patient will return as needed.

## 2016-09-16 NOTE — Patient Instructions (Signed)
Seen for new lesion on both great toes. Aperture pad, surgical shoe placed with instruction. Return as needed.

## 2017-02-05 ENCOUNTER — Other Ambulatory Visit: Payer: Self-pay | Admitting: Nurse Practitioner

## 2017-02-05 DIAGNOSIS — B182 Chronic viral hepatitis C: Secondary | ICD-10-CM

## 2017-02-17 ENCOUNTER — Ambulatory Visit
Admission: RE | Admit: 2017-02-17 | Discharge: 2017-02-17 | Disposition: A | Discharge status: Home / Self Care | Payer: BLUE CROSS/BLUE SHIELD | Source: Ambulatory Visit | Attending: Nurse Practitioner | Admitting: Nurse Practitioner

## 2017-02-17 DIAGNOSIS — B182 Chronic viral hepatitis C: Secondary | ICD-10-CM

## 2017-03-09 ENCOUNTER — Ambulatory Visit: Payer: BLUE CROSS/BLUE SHIELD | Admitting: Family Medicine

## 2017-03-09 ENCOUNTER — Encounter: Payer: Self-pay | Admitting: Family Medicine

## 2017-03-09 ENCOUNTER — Other Ambulatory Visit: Payer: Self-pay

## 2017-03-09 VITALS — BP 128/72 | HR 80 | Temp 98.1°F | Ht 72.0 in | Wt 230.2 lb

## 2017-03-09 DIAGNOSIS — I10 Essential (primary) hypertension: Secondary | ICD-10-CM | POA: Diagnosis not present

## 2017-03-09 DIAGNOSIS — F3341 Major depressive disorder, recurrent, in partial remission: Secondary | ICD-10-CM | POA: Diagnosis not present

## 2017-03-09 DIAGNOSIS — G8929 Other chronic pain: Secondary | ICD-10-CM | POA: Diagnosis not present

## 2017-03-09 DIAGNOSIS — M545 Low back pain: Secondary | ICD-10-CM | POA: Diagnosis not present

## 2017-03-09 DIAGNOSIS — Z23 Encounter for immunization: Secondary | ICD-10-CM | POA: Diagnosis not present

## 2017-03-09 DIAGNOSIS — F1120 Opioid dependence, uncomplicated: Secondary | ICD-10-CM | POA: Diagnosis not present

## 2017-03-09 DIAGNOSIS — M48061 Spinal stenosis, lumbar region without neurogenic claudication: Secondary | ICD-10-CM | POA: Insufficient documentation

## 2017-03-09 DIAGNOSIS — E669 Obesity, unspecified: Secondary | ICD-10-CM | POA: Insufficient documentation

## 2017-03-09 DIAGNOSIS — M797 Fibromyalgia: Secondary | ICD-10-CM | POA: Diagnosis not present

## 2017-03-09 MED ORDER — OXYCODONE HCL 10 MG PO TABS: 10.0000 mg | ORAL_TABLET | Freq: Three times a day (TID) | ORAL | 0 refills | Status: DC

## 2017-03-09 NOTE — Progress Notes (Signed)
Subjective  CC:  Chief Complaint  Patient presents with  . Establish Care    Transfer from [place]  . Back Pain    Chronic Back Pain    HPI: William Solis is a 67 y.o. male who presents to [place] at [facility] today to establish care with me as a new patient. He is a former [place] patient and is here to reestablish care with me today.   He has the following concerns or needs:   Chronic pain on narcotics for LBP, DDD and lumbar stenosis formerly managed by neuro: Dr. Ellen Henri office is now a [facility] and is NO longer managing chronic pain. Ran out of oxycodone 2 days ago. Has upcoming appt with pain mgt in [place]. Needs refill. Has been on meds for many years. No new sxs.   ADD - was also managed by neuro. Has meds to last till spring. ADD dxd about 20 years ago.   HTN - well controlled. Taking bp meds. Last labs reviewed from 05/2016. Nl renal function. No edema or sxs of CAD or chf.   Depression - doing OK; was terminated from his long term job in may 2018. This was a struggle but he is dealing with it better now. Remains on his antidepressants. Trying to maintain a positive outlook. Santiago Glad, his wife, has been supportive.   We updated and reviewed the patient's past history in detail and it is documented below.  Patient Active Problem List   Diagnosis Date Noted  . OSA on CPAP 11/07/2015    Priority: High  . History of osteomyelitis 10/25/2013    Priority: High  . Chronic low back pain 05/12/2012    Priority: High  . Narcotic dependence ([place]) 05/12/2012    Priority: High  . Chronic hepatitis C ([place]) 04/19/2010    Priority: High  . Hypertension 04/19/2010    Priority: High  . MDD (major depressive disorder) 11/20/2008    Priority: High  . ADD (attention deficit disorder) 12/01/2006    Priority: High  . Lumbar stenosis 03/09/2017    Priority: Medium  . Fibromyalgia 09/15/2016    Priority: Medium  . Bilateral edema of lower  extremity 10/12/2015    Priority: Medium  . S/P removal of parathyroid gland ([place]) 12/21/2013    Priority: Low  . History of hyperparathyroidism 11/22/2013    Priority: Low  . Dry eye syndrome 11/20/2008    Priority: Low  . Obesity (BMI 30-39.9) 03/09/2017   Health Maintenance  Topic Date Due  . Samul Dada  01/20/2017  . COLONOSCOPY  08/12/2023  . INFLUENZA VACCINE  Completed  . Hepatitis C Screening  Completed  . HIV Screening  Completed   Immunization History  Administered Date(s) Administered  . Hepatitis B 09/09/2013, 10/12/2013, 03/21/2014  . Hepatitis B, adult 09/09/2013, 10/12/2013, 03/21/2014  . Hepatitis B, ped/adol 09/09/2013, 10/12/2013, 03/21/2014  . Influenza, Seasonal, Injecte, Preservative Fre 11/11/2012, 10/05/2014, 10/12/2015  . Influenza,inj,Quad PF,6+ Mos 10/05/2014  . Influenza-Unspecified 11/11/2012  . Pneumococcal Conjugate-13 05/23/2016  . Pneumococcal Polysaccharide-23 05/20/2013  . Tdap 01/21/2007  . Zoster 11/11/2012   Current Meds  Medication Sig  . amLODipine (NORVASC) 10 MG tablet Take 10 mg by mouth daily.  . Ascorbic Acid (VITAMIN C) 1000 MG tablet Take 1,000 mg by mouth daily.  . calcium-vitamin D (OSCAL WITH D) 500-200 MG-UNIT per tablet Take 2 tablets by mouth 4 (four) times daily.  . carvedilol (COREG) 25 MG tablet Take 25 mg by  mouth 2 (two) times daily with a meal.  . clonazePAM (KLONOPIN) 0.5 MG tablet Take 0.5 mg by mouth 2 (two) times daily as needed for anxiety.  . cyclobenzaprine (FLEXERIL) 10 MG tablet Take 5 mg by mouth 3 (three) times daily as needed for muscle spasms.   Marland Kitchen dextroamphetamine (DEXTROSTAT) 10 MG tablet Up to 3 in the morning as needed  . escitalopram (LEXAPRO) 20 MG tablet Take 30 mg by mouth daily.  Marland Kitchen gabapentin (NEURONTIN) 600 MG tablet Take 600 mg by mouth at bedtime as needed (pain).   . Glucosamine-Chondroit-Vit C-Mn (GLUCOSAMINE 1500 COMPLEX PO) Take by mouth.  . hydrochlorothiazide (HYDRODIURIL) 25 MG  tablet Take 25 mg by mouth.   Marland Kitchen lisinopril (PRINIVIL,ZESTRIL) 40 MG tablet Take 40 mg by mouth daily.  . Melatonin 5 MG TABS Take 0.5 tablets by mouth at bedtime.   . meloxicam (MOBIC) 7.5 MG tablet Take 7.5 mg by mouth daily as needed. For pain in thumbs  . Multiple Vitamins-Minerals (MULTIVITAMIN WITH MINERALS) tablet Take 1 tablet by mouth daily.  . Omega-3 Fatty Acids (FISH OIL PO) Take 1,000 mg by mouth daily.   Marland Kitchen OVER THE COUNTER MEDICATION 250 mg.  . Oxycodone HCl 10 MG TABS Take 1 tablet (10 mg total) by mouth 3 (three) times daily.  . [DISCONTINUED] Oxycodone HCl 10 MG TABS Take by mouth.    Allergies: Patient has No Known Allergies. Past Medical History Patient  has a past medical history of ADHD, adult residual type, Anxiety, Depression, Hepatitis C, Hypertension, Insomnia, Muscle spasm, Neuropathy involving both lower extremities, and Staph infection. Past Surgical History Patient  has a past surgical history that includes Back surgery (2006); Sinus exploration (2005/2007); Amputation (Left, 10/21/2013); TEE without cardioversion (N/A, 10/24/2013); Parathyroidectomy (12/21/2013); and Parathyroidectomy (N/A, 12/21/2013). Family History: Patient family history includes Hypertension in his father and mother. Social History:  Patient  reports that he quit smoking about 26 years ago. His smoking use included cigarettes and cigars. he has never used smokeless tobacco. He reports that he does not drink alcohol or use drugs.  Review of Systems: Constitutional: negative for fever or malaise Ophthalmic: negative for photophobia, double vision or loss of vision Cardiovascular: negative for chest pain, dyspnea on exertion, or new LE swelling Respiratory: negative for SOB or persistent cough Gastrointestinal: negative for abdominal pain, change in bowel habits or melena Genitourinary: negative for dysuria or gross hematuria Musculoskeletal: negative for new gait disturbance or muscular  weakness Integumentary: negative for new or persistent rashes Neurological: negative for TIA or stroke symptoms Psychiatric: negative for SI or delusions Allergic/Immunologic: negative for hives  Patient Care Team    Relationship Specialty Notifications Start End  Leamon Arnt, MD PCP - General Family Medicine  11/30/13   [name], MD Consulting Physician Orthopedic Surgery  10/22/13   Lauraine Rinne, MD Consulting Physician Neurology  03/09/17   Dionne Milo, MD Consulting Physician Pulmonary Disease  03/09/17   Roosevelt Locks, [place]  Nurse Practitioner  03/09/17    Comment: hepatology    Objective  Vitals: BP 128/72   Pulse 80   Temp 98.1 F (36.7 C)   Ht 6' (1.829 m)   Wt 230 lb 3.2 oz (104.4 kg)   BMI 31.22 kg/m  General:  Well developed, well nourished, no acute distress  Psych:  Alert and oriented,normal mood and affect, smiles and laughs HEENT:  Normocephalic, atraumatic, non-icteric sclera, PERRL, oropharynx is without mass or exudate, supple neck without adenopathy, mass or thyromegaly  Cardiovascular:  RRR without gallop, rub or murmur, nondisplaced PMI Respiratory:  Good breath sounds bilaterally, CTAB with normal respiratory effort Skin:  Warm, no rashes or suspicious lesions noted  Assessment  1. Essential hypertension   2. Chronic bilateral low back pain without sciatica   3. Spinal stenosis of lumbar region without neurogenic claudication   4. Narcotic dependence ([place])   5. Recurrent major depressive disorder, in partial remission ([place])   6. Fibromyalgia      Plan   bp control is excellent. This medical condition is well controlled. There are no signs of complications, medication side effects, or red flags. Patient is instructed to continue the current treatment plan without change in therapies or medications.   Chronic LBP and narcotic dependence: refilled meds for 30 days to bridge use until pain mgt can take over. UDS today.   Depression - stable.  Follow closely.   Follow up:  Return in about 3 months (around 06/06/2017) for complete physical.  Commons side effects, risks, benefits, and alternatives for medications and treatment plan prescribed today were discussed, and the patient expressed understanding of the given instructions. Patient is instructed to call or message via MyChart if he/she has any questions or concerns regarding our treatment plan. No barriers to understanding were identified. We discussed Red Flag symptoms and signs in detail. Patient expressed understanding regarding what to do in case of urgent or emergency type symptoms.   Medication list was reconciled, printed and provided to the patient in AVS. Patient instructions and summary information was reviewed with the patient as documented in the AVS. This note was prepared with assistance of Dragon voice recognition software. Occasional wrong-word or sound-a-like substitutions may have occurred due to the inherent limitations of voice recognition software  Orders Placed This Encounter  Procedures  . Td : Tetanus/diphtheria >7yo Preservative  free  . Pain Mgmt, Profile 8 w/Conf, U   Meds ordered this encounter  Medications  . Oxycodone HCl 10 MG TABS    Sig: Take 1 tablet (10 mg total) by mouth 3 (three) times daily.    Dispense:  90 tablet    Refill:  0

## 2017-03-09 NOTE — Patient Instructions (Signed)
It was so good seeing you again! Thank you for establishing with my new practice and allowing me to continue caring for you. It means a lot to me.   Please schedule a follow up appointment with me in 3 months for your complete physical; please come fasting.

## 2017-03-11 LAB — PAIN MGMT, PROFILE 8 W/CONF, U
6 ACETYLMORPHINE: NEGATIVE ng/mL (ref <10)
ALCOHOL METABOLITES: NEGATIVE ng/mL (ref <500)
Amphetamines: NEGATIVE ng/mL (ref <500)
BUPRENORPHINE, URINE: NEGATIVE ng/mL (ref <5)
Benzodiazepines: NEGATIVE ng/mL (ref <100)
COCAINE METABOLITE: NEGATIVE ng/mL (ref <150)
Creatinine: 64.3 mg/dL
MARIJUANA METABOLITE: NEGATIVE ng/mL (ref <20)
MDMA: NEGATIVE ng/mL (ref <500)
Opiates: NEGATIVE ng/mL (ref <100)
Oxidant: NEGATIVE ug/mL (ref <200)
pH: 7.22 (ref 4.5–9.0)

## 2017-04-01 DIAGNOSIS — M961 Postlaminectomy syndrome, not elsewhere classified: Secondary | ICD-10-CM | POA: Insufficient documentation

## 2017-06-02 ENCOUNTER — Other Ambulatory Visit: Payer: Self-pay

## 2017-06-02 ENCOUNTER — Ambulatory Visit (INDEPENDENT_AMBULATORY_CARE_PROVIDER_SITE_OTHER): Payer: BLUE CROSS/BLUE SHIELD | Admitting: Family Medicine

## 2017-06-02 ENCOUNTER — Encounter: Payer: Self-pay | Admitting: Family Medicine

## 2017-06-02 VITALS — BP 118/86 | Temp 97.9°F | Ht 72.0 in | Wt 218.6 lb

## 2017-06-02 DIAGNOSIS — Z0000 Encounter for general adult medical examination without abnormal findings: Secondary | ICD-10-CM | POA: Diagnosis not present

## 2017-06-02 DIAGNOSIS — I10 Essential (primary) hypertension: Secondary | ICD-10-CM | POA: Diagnosis not present

## 2017-06-02 LAB — LIPID PANEL
CHOLESTEROL: 173 mg/dL (ref 0–200)
HDL: 42 mg/dL (ref >39.00)
LDL CALC: 105 mg/dL — AB (ref 0–99)
NonHDL: 131.23
Total CHOL/HDL Ratio: 4
Triglycerides: 129 mg/dL (ref 0.0–149.0)
VLDL: 25.8 mg/dL (ref 0.0–40.0)

## 2017-06-02 LAB — CBC WITH DIFFERENTIAL/PLATELET
BASOS ABS: 0.1 10*3/uL (ref 0.0–0.1)
Basophils Relative: 0.7 % (ref 0.0–3.0)
EOS ABS: 0.2 10*3/uL (ref 0.0–0.7)
Eosinophils Relative: 1.9 % (ref 0.0–5.0)
HEMATOCRIT: 47.5 % (ref 39.0–52.0)
HEMOGLOBIN: 16.6 g/dL (ref 13.0–17.0)
LYMPHS PCT: 17.4 % (ref 12.0–46.0)
Lymphs Abs: 1.6 10*3/uL (ref 0.7–4.0)
MCHC: 35 g/dL (ref 30.0–36.0)
MCV: 89.1 fl (ref 78.0–100.0)
MONOS PCT: 6.4 % (ref 3.0–12.0)
Monocytes Absolute: 0.6 10*3/uL (ref 0.1–1.0)
NEUTROS PCT: 73.6 % (ref 43.0–77.0)
Neutro Abs: 6.7 10*3/uL (ref 1.4–7.7)
Platelets: 215 10*3/uL (ref 150.0–400.0)
RBC: 5.33 Mil/uL (ref 4.22–5.81)
RDW: 12.9 % (ref 11.5–15.5)
WBC: 9.1 10*3/uL (ref 4.0–10.5)

## 2017-06-02 LAB — COMPREHENSIVE METABOLIC PANEL
ALBUMIN: 4.2 g/dL (ref 3.5–5.2)
ALT: 17 U/L (ref 0–53)
AST: 17 U/L (ref 0–37)
Alkaline Phosphatase: 68 U/L (ref 39–117)
BUN: 25 mg/dL — AB (ref 6–23)
CO2: 28 mEq/L (ref 19–32)
Calcium: 9.5 mg/dL (ref 8.4–10.5)
Chloride: 100 mEq/L (ref 96–112)
Creatinine, Ser: 1.19 mg/dL (ref 0.40–1.50)
GFR: 65.29 mL/min (ref >60.00)
GLUCOSE: 108 mg/dL — AB (ref 70–99)
POTASSIUM: 3.8 meq/L (ref 3.5–5.1)
SODIUM: 139 meq/L (ref 135–145)
Total Bilirubin: 0.7 mg/dL (ref 0.2–1.2)
Total Protein: 7.9 g/dL (ref 6.0–8.3)

## 2017-06-02 MED ORDER — HYDROCHLOROTHIAZIDE 25 MG PO TABS: 25.0000 mg | ORAL_TABLET | Freq: Every day | ORAL | 3 refills | Status: DC

## 2017-06-02 MED ORDER — LISINOPRIL 40 MG PO TABS: 40.0000 mg | ORAL_TABLET | Freq: Every day | ORAL | 3 refills | Status: DC

## 2017-06-02 MED ORDER — CARVEDILOL 25 MG PO TABS: 25.0000 mg | ORAL_TABLET | Freq: Two times a day (BID) | ORAL | 3 refills | Status: DC

## 2017-06-02 MED ORDER — AMLODIPINE BESYLATE 10 MG PO TABS: 10.0000 mg | ORAL_TABLET | Freq: Every day | ORAL | 3 refills | Status: DC

## 2017-06-02 NOTE — Patient Instructions (Addendum)
Please return in 6 months for recheck on hypertension. Follow your blood pressure outside of the office. We may be able to decrease medications if you continue to lose weight.   Start strength training exercises. Core exercises can be done at home on a mat.   I will release your lab results to you on your MyChart account with further instructions. Please reply with any questions.   If you have any questions or concerns, please don't hesitate to send me a message via MyChart or call the office at [phone]. Thank you for visiting with [place] today! It's our pleasure caring for you.  Please do these things to maintain good health!   Exercise at least 30-45 minutes a day,  4-5 days a week.   Eat a low-fat diet with lots of fruits and vegetables, up to 7-9 servings per day.  Drink plenty of water daily. Try to drink 8 8oz glasses per day.  Seatbelts can save your life. Always wear your seatbelt.  Place Smoke Detectors on every level of your home and check batteries every year.  Eye Doctor - have an eye exam every 1-2 years  Safe sex - use condoms to protect yourself from STDs if you could be exposed to these types of infections.  Avoid heavy alcohol use. If you drink, keep it to less than 2 drinks/day and not every day.  [place].  Choose someone you trust that could speak for you if you became unable to speak for yourself.  Depression is common in our stressful world.If you're feeling down or losing interest in things you normally enjoy, please come in for a visit.

## 2017-06-02 NOTE — Progress Notes (Signed)
Subjective  Chief Complaint  Patient presents with  . Annual Exam    doing well, HM up to date, last physical 05/23/2016  . Hypertension  . Depression  . Anxiety    HPI: William Solis is a 67 y.o. male who presents to [place] at [facility] today for a Male Wellness Visit. He also has the concerns and/or needs as listed above in the chief complaint. These will be addressed in addition to the Health Maintenance Visit.   Wellness Visit: annual visit with health maintenance review and exam    HM: up to date. Due for labs. Retired now and doing ok. Has been working on redoing his basement and has lost some weight. Feeling good. Seeing pain mgt for chronic low back pain. meds have been decreased some.  Lifestyle: Body mass index is 29.65 kg/m. Wt Readings from Last 3 Encounters:  06/02/17 218 lb 9.6 oz (99.2 kg)  03/09/17 230 lb 3.2 oz (104.4 kg)  02/16/15 240 lb (108.9 kg)   Diet: general Exercise: intermittently,   Chronic disease management visit and/or acute problem visit:  HTN: well controlled. On meds w/o AEs. No longer having swelling. No sob.   ADD/mood/ - both stable.   Patient Active Problem List   Diagnosis Date Noted  . Postlaminectomy syndrome 04/01/2017    Priority: High  . OSA on CPAP 11/07/2015    Priority: High  . History of osteomyelitis 10/25/2013    Priority: High  . Chronic low back pain 05/12/2012    Priority: High  . Narcotic dependence ([place]) 05/12/2012    Priority: High  . Chronic hepatitis C ([place]) 04/19/2010    Priority: High  . Hypertension 04/19/2010    Priority: High  . MDD (major depressive disorder) 11/20/2008    Priority: High  . ADD (attention deficit disorder) 12/01/2006    Priority: High  . Lumbar stenosis 03/09/2017    Priority: Medium  . Fibromyalgia 09/15/2016    Priority: Medium  . Risk for falls 06/25/2016    Priority: Medium  . Bilateral edema of lower extremity 10/12/2015    Priority: Medium  .  S/P removal of parathyroid gland ([place]) 12/21/2013    Priority: Low  . History of hyperparathyroidism 11/22/2013    Priority: Low  . Dry eye syndrome 11/20/2008    Priority: Low   Health Maintenance  Topic Date Due  . INFLUENZA VACCINE  08/20/2017  . COLONOSCOPY  08/12/2023  . TETANUS/TDAP  03/10/2027  . Hepatitis C Screening  Completed  . HIV Screening  Completed   Immunization History  Administered Date(s) Administered  . Hepatitis B 09/09/2013, 10/12/2013, 03/21/2014  . Hepatitis B, adult 09/09/2013, 10/12/2013, 03/21/2014  . Hepatitis B, ped/adol 09/09/2013, 10/12/2013, 03/21/2014  . Influenza, High Dose Seasonal PF 11/11/2012, 10/05/2014, 10/12/2015  . Influenza, Seasonal, Injecte, Preservative Fre 11/11/2012, 10/05/2014, 10/12/2015  . Influenza,inj,Quad PF,6+ Mos 10/05/2014, 12/05/2016  . Influenza-Unspecified 11/11/2012, 10/05/2014, 10/12/2015  . Pneumococcal Conjugate-13 05/23/2016  . Pneumococcal Polysaccharide-23 05/20/2013  . Td 03/09/2017  . Tdap 01/21/2007  . Zoster 11/11/2012   We updated and reviewed the patient's past history in detail and it is documented below. Allergies: Patient has No Known Allergies. Past Medical History  has a past medical history of ADHD, adult residual type, Anxiety, Depression, Hepatitis C, Hypertension, Insomnia, Muscle spasm, Neuropathy involving both lower extremities, and Staph infection. Past Surgical History Patient  has a past surgical history that includes Back surgery (2006); Sinus exploration (2005/2007); Amputation (Left, 10/21/2013); TEE  without cardioversion (N/A, 10/24/2013); Parathyroidectomy (12/21/2013); and Parathyroidectomy (N/A, 12/21/2013). Social History Patient  reports that he quit smoking about 26 years ago. His smoking use included cigarettes and cigars. He has never used smokeless tobacco. He reports that he does not drink alcohol or use drugs. Family History family history includes Hypertension in his father and  mother. Review of Systems: Constitutional: negative for fever or malaise Ophthalmic: negative for photophobia, double vision or loss of vision Cardiovascular: negative for chest pain, dyspnea on exertion, or new LE swelling Respiratory: negative for SOB or persistent cough Gastrointestinal: negative for abdominal pain, change in bowel habits or melena Genitourinary: negative for dysuria or gross hematuria Musculoskeletal: negative for new gait disturbance or muscular weakness Integumentary: negative for new or persistent rashes Neurological: negative for TIA or stroke symptoms Psychiatric: negative for SI or delusions Allergic/Immunologic: negative for hives  Patient Care Team    Relationship Specialty Notifications Start End  Leamon Arnt, MD PCP - General Family Medicine  11/30/13   [name], MD Consulting Physician Orthopedic Surgery  10/22/13   Lauraine Rinne, MD Consulting Physician Neurology  03/09/17   Dionne Milo, MD Consulting Physician Pulmonary Disease  03/09/17   Roosevelt Locks, [place]  Nurse Practitioner  03/09/17    Comment: hepatology   Objective  Vitals: BP 118/86   Temp 97.9 F (36.6 C)   Ht 6' (1.829 m)   Wt 218 lb 9.6 oz (99.2 kg)   BMI 29.65 kg/m  General:  Well developed, well nourished, no acute distress  Psych:  Alert and orientedx3,normal mood and affect HEENT:  Normocephalic, atraumatic, non-icteric sclera, PERRL, oropharynx is clear without mass or exudate, supple neck without adenopathy, mass or thyromegaly Cardiovascular:  Normal S1, S2, RRR without gallop, rub or murmur, nondisplaced PMI, +2 distal pulses in bilateral upper and lower extremities. Respiratory:  Good breath sounds bilaterally, CTAB with normal respiratory effort Gastrointestinal: normal bowel sounds, soft, non-tender, no noted masses. No HSM, rectus diathesis present MSK: no deformities, contusions. Joints are without erythema or swelling. Spine and CVA region are nontender Skin:   Warm, no rashes or suspicious lesions noted Neurologic:    Mental status is normal. CN 2-11 are normal. Gross motor and sensory exams are normal. Stable gait. No tremor GU: No inguinal hernias or adenopathy are appreciated bilaterally   Assessment  1. Annual physical exam   2. Essential hypertension      Plan  Male Wellness Visit:  Age appropriate Health Maintenance and Prevention measures were discussed with patient. Included topics are cancer screening recommendations, ways to keep healthy (see AVS) including dietary and exercise recommendations, regular eye and dental care, use of seat belts, and avoidance of moderate alcohol use and tobacco use.   BMI: discussed patient's BMI and encouraged positive lifestyle modifications to help get to or maintain a target BMI. Continue weight loss   HM needs and immunizations were addressed and ordered. See below for orders. See HM and immunization section for updates.  Routine labs and screening tests ordered including cmp, cbc and lipids where appropriate.  Discussed recommendations regarding Vit D and calcium supplementation (see AVS)  Chronic disease f/u and/or acute problem visit: (deemed necessary to be done in addition to the wellness visit):  HTN. This medical condition is well controlled. There are no signs of complications, medication side effects, or red flags. Patient is instructed to continue the current treatment plan without change in therapies or medications.   Chronic problems are stable.  Follow up: Return in about 6 months (around 12/03/2017) for follow up Hypertension.   Commons side effects, risks, benefits, and alternatives for medications and treatment plan prescribed today were discussed, and the patient expressed understanding of the given instructions. Patient is instructed to call or message via MyChart if he/she has any questions or concerns regarding our treatment plan. No barriers to understanding were identified.  We discussed Red Flag symptoms and signs in detail. Patient expressed understanding regarding what to do in case of urgent or emergency type symptoms.   Medication list was reconciled, printed and provided to the patient in AVS. Patient instructions and summary information was reviewed with the patient as documented in the AVS. This note was prepared with assistance of Dragon voice recognition software. Occasional wrong-word or sound-a-like substitutions may have occurred due to the inherent limitations of voice recognition software  Orders Placed This Encounter  Procedures  . CBC with Differential/Platelet  . Comprehensive metabolic panel  . Lipid panel   Meds ordered this encounter  Medications  . amLODipine (NORVASC) 10 MG tablet    Sig: Take 1 tablet (10 mg total) by mouth daily.    Dispense:  90 tablet    Refill:  3  . carvedilol (COREG) 25 MG tablet    Sig: Take 1 tablet (25 mg total) by mouth 2 (two) times daily with a meal.    Dispense:  180 tablet    Refill:  3  . lisinopril (PRINIVIL,ZESTRIL) 40 MG tablet    Sig: Take 1 tablet (40 mg total) by mouth daily.    Dispense:  90 tablet    Refill:  3  . hydrochlorothiazide (HYDRODIURIL) 25 MG tablet    Sig: Take 1 tablet (25 mg total) by mouth daily.    Dispense:  90 tablet    Refill:  3

## 2017-11-30 ENCOUNTER — Ambulatory Visit (INDEPENDENT_AMBULATORY_CARE_PROVIDER_SITE_OTHER): Payer: Medicare Other | Admitting: Family Medicine

## 2017-11-30 ENCOUNTER — Encounter: Payer: Self-pay | Admitting: Family Medicine

## 2017-11-30 ENCOUNTER — Other Ambulatory Visit: Payer: Self-pay

## 2017-11-30 VITALS — BP 122/80 | HR 76 | Temp 98.0°F | Ht 72.0 in | Wt 216.0 lb

## 2017-11-30 DIAGNOSIS — I10 Essential (primary) hypertension: Secondary | ICD-10-CM

## 2017-11-30 DIAGNOSIS — F3341 Major depressive disorder, recurrent, in partial remission: Secondary | ICD-10-CM

## 2017-11-30 DIAGNOSIS — G8929 Other chronic pain: Secondary | ICD-10-CM

## 2017-11-30 DIAGNOSIS — N481 Balanitis: Secondary | ICD-10-CM

## 2017-11-30 DIAGNOSIS — M797 Fibromyalgia: Secondary | ICD-10-CM

## 2017-11-30 DIAGNOSIS — M545 Low back pain, unspecified: Secondary | ICD-10-CM

## 2017-11-30 MED ORDER — CITALOPRAM HYDROBROMIDE 20 MG PO TABS: 20.0000 mg | ORAL_TABLET | Freq: Every day | ORAL | 3 refills | Status: DC

## 2017-11-30 NOTE — Progress Notes (Signed)
Subjective  CC:  Chief Complaint  Patient presents with  . Hypertension    6 month f/u, doing well, BP has been 120's/70s- 130's/80  . Rash    on Penis, red colored rash, denies itch and drainage  . Medication Problem    wants to discuss other options for Depression medication, Cymbalta hasnt helped and Lexapro is too costly     HPI: William Solis is a 67 y.o. male who presents to the office today to address the problems listed above in the chief complaint.  Hypertension f/u: Control is good . Pt reports he is doing well. taking medications as instructed, no medication side effects noted, no TIAs, no chest pain on exertion, no dyspnea on exertion, no swelling of ankles. He denies adverse effects from his BP medications. Compliance with medication is good.   Depression: pain doc had changed from lexapro to cymbalta to see if could get better relief for chronic pain syndrome and fibromyalgia, however pt did not see benefit. Mood is fair but no longer able to afford lexapro (on medicare w/o D) - has been on multiple ssri's in past: does well with celexa, lexapro.  Depression screen [facility] 2/9 11/30/2017 06/02/2017 03/09/2017 03/09/2017  Decreased Interest 1 0 0 0  Down, Depressed, Hopeless 1 0 0 0  PHQ - 2 Score 2 0 0 0  Altered sleeping 0 0 - -  Tired, decreased energy 1 0 - -  Change in appetite 0 0 - -  Feeling bad or failure about yourself  0 0 - -  Trouble concentrating 1 0 - -  Moving slowly or fidgety/restless 0 0 - -  Suicidal thoughts 0 0 - -  PHQ-9 Score 4 0 - -  Difficult doing work/chores - Not difficult at all - -    Penile rash; on glans. Mildly red. No itching, discharge, risk of stds or pain. Had about 5 years ago. I reviewed those notes. No urinary sxs  Assessment  1. Essential hypertension   2. Recurrent major depressive disorder, in partial remission ([place])   3. Fibromyalgia   4. Chronic bilateral low back pain without sciatica   5. Balanitis      Plan     Hypertension f/u: BP control is well controlled. This medical condition is well controlled. There are no signs of complications, medication side effects, or red flags. Patient is instructed to continue the current treatment plan without change in therapies or medications.   Depression: mildly active: see if generic citalopram is affordable. Pt to try drugRX or lexapro financial assistance program. If can't afford it, consider doxepin. Pt agrees.   Treat for fungal/yeast hydradenitis - with otc azole and steroid cream. F/u if not improving.   Education regarding management of these chronic disease states was given. Management strategies discussed on successive visits include dietary and exercise recommendations, goals of achieving and maintaining IBW, and lifestyle modifications aiming for adequate sleep and minimizing stressors.   Follow up: Return in about 6 months (around 05/31/2018) for complete physical.  No orders of the defined types were placed in this encounter.  Meds ordered this encounter  Medications  . citalopram (CELEXA) 20 MG tablet    Sig: Take 1 tablet (20 mg total) by mouth daily.    Dispense:  90 tablet    Refill:  3      BP Readings from Last 3 Encounters:  11/30/17 122/80  06/02/17 118/86  03/09/17 128/72   Wt Readings from Last  3 Encounters:  11/30/17 216 lb (98 kg)  06/02/17 218 lb 9.6 oz (99.2 kg)  03/09/17 230 lb 3.2 oz (104.4 kg)    Lab Results  Component Value Date   CHOL 173 06/02/2017   Lab Results  Component Value Date   HDL 42.00 06/02/2017   Lab Results  Component Value Date   LDLCALC 105 (H) 06/02/2017   Lab Results  Component Value Date   TRIG 129.0 06/02/2017   Lab Results  Component Value Date   CHOLHDL 4 06/02/2017   No results found for: LDLDIRECT Lab Results  Component Value Date   CREATININE 1.19 06/02/2017   BUN 25 (H) 06/02/2017   NA 139 06/02/2017   K 3.8 06/02/2017   CL 100 06/02/2017   CO2 28 06/02/2017     The 10-year ASCVD risk score Mikey Bussing DC Jr., et al., 2013) is: 13.6%   Values used to calculate the score:     Age: 49 years     Sex: Male     Is Non-Hispanic African American: No     Diabetic: No     Tobacco smoker: No     Systolic Blood Pressure: 161 mmHg     Is BP treated: Yes     HDL Cholesterol: 42 mg/dL     Total Cholesterol: 173 mg/dL  I reviewed the patients updated PMH, FH, and SocHx.    Patient Active Problem List   Diagnosis Date Noted  . Postlaminectomy syndrome 04/01/2017    Priority: High  . OSA on CPAP 11/07/2015    Priority: High  . History of osteomyelitis 10/25/2013    Priority: High  . Chronic low back pain 05/12/2012    Priority: High  . Narcotic dependence ([place]) 05/12/2012    Priority: High  . History of hepatitis C 04/19/2010    Priority: High  . Hypertension 04/19/2010    Priority: High  . MDD (major depressive disorder) 11/20/2008    Priority: High  . ADD (attention deficit disorder) 12/01/2006    Priority: High  . Lumbar stenosis 03/09/2017    Priority: Medium  . Fibromyalgia 09/15/2016    Priority: Medium  . Risk for falls 06/25/2016    Priority: Medium  . Bilateral edema of lower extremity 10/12/2015    Priority: Medium  . S/P removal of parathyroid gland ([place]) 12/21/2013    Priority: Low  . History of hyperparathyroidism 11/22/2013    Priority: Low  . Dry eye syndrome 11/20/2008    Priority: Low    Allergies: Patient has no known allergies.  Social History: Patient  reports that he quit smoking about 27 years ago. His smoking use included cigarettes and cigars. He has never used smokeless tobacco. He reports that he does not drink alcohol or use drugs.  Current Meds  Medication Sig  . amLODipine (NORVASC) 10 MG tablet Take 1 tablet (10 mg total) by mouth daily.  . Ascorbic Acid (VITAMIN C) 1000 MG tablet Take 1,000 mg by mouth daily.  . carvedilol (COREG) 25 MG tablet Take 1 tablet (25 mg total) by mouth 2 (two) times daily  with a meal.  . cyclobenzaprine (FLEXERIL) 10 MG tablet Take 5 mg by mouth 3 (three) times daily as needed for muscle spasms.   Marland Kitchen dextroamphetamine (DEXTROSTAT) 10 MG tablet Up to 3 in the morning as needed  . Glucosamine-Chondroit-Vit C-Mn (GLUCOSAMINE 1500 COMPLEX PO) Take by mouth.  . hydrochlorothiazide (HYDRODIURIL) 25 MG tablet Take 1 tablet (25 mg total) by mouth daily.  Marland Kitchen  lisinopril (PRINIVIL,ZESTRIL) 40 MG tablet Take 1 tablet (40 mg total) by mouth daily.  . Melatonin 5 MG TABS Take 0.5 tablets by mouth at bedtime.   . meloxicam (MOBIC) 15 MG tablet Take 15 mg by mouth daily. As needed for pain in thumbs  . Multiple Vitamins-Minerals (MULTIVITAMIN WITH MINERALS) tablet Take 1 tablet by mouth daily.  . Omega-3 Fatty Acids (FISH OIL PO) Take 1,000 mg by mouth daily.   . Oxycodone HCl 10 MG TABS Take 1 tablet (10 mg total) by mouth 3 (three) times daily.    Review of Systems: Cardiovascular: negative for chest pain, palpitations, leg swelling, orthopnea Respiratory: negative for SOB, wheezing or persistent cough Gastrointestinal: negative for abdominal pain Genitourinary: negative for dysuria or gross hematuria  Objective  Vitals: BP 122/80   Pulse 76   Temp 98 F (36.7 C)   Ht 6' (1.829 m)   Wt 216 lb (98 kg)   SpO2 96%   BMI 29.29 kg/m  General: no acute distress  Psych:  Alert and oriented, normal mood and affect HEENT:  Normocephalic, atraumatic, supple neck  Cardiovascular:  RRR without murmur. no edema Respiratory:  Good breath sounds bilaterally, CTAB with normal respiratory effort Skin:  Warm, small red area on glans w/o discharge or flaking Neurologic:   Mental status is normal  Commons side effects, risks, benefits, and alternatives for medications and treatment plan prescribed today were discussed, and the patient expressed understanding of the given instructions. Patient is instructed to call or message via MyChart if he/she has any questions or concerns  regarding our treatment plan. No barriers to understanding were identified. We discussed Red Flag symptoms and signs in detail. Patient expressed understanding regarding what to do in case of urgent or emergency type symptoms.   Medication list was reconciled, printed and provided to the patient in AVS. Patient instructions and summary information was reviewed with the patient as documented in the AVS. This note was prepared with assistance of Dragon voice recognition software. Occasional wrong-word or sound-a-like substitutions may have occurred due to the inherent limitations of voice recognition software

## 2017-11-30 NOTE — Patient Instructions (Addendum)
Please return in May 2020 for your annual complete physical; please come fasting.  Use clotrimazole or miconazole with hydrocorticone cream twice day for 2 weeks.   If you have any questions or concerns, please don't hesitate to send me a message via MyChart or call the office at [phone]. Thank you for visiting with [place] today! It's our pleasure caring for you.   Balanitis Balanitis is swelling and irritation (inflammation) of the head of the penis (glans penis). The condition may also cause inflammation of the skin around the glans penis (foreskin) in men who have not been circumcised. It may develop because of an infection or another medical condition. Balanitis occurs most often among men who have not had their foreskin removed (uncircumcised men). Balanitis sometimes causes scarring of the penis or foreskin, which can require surgery. Untreated balanitis can increase the risk of penile cancer. What are the causes? Common causes of this condition include:  Poor personal hygiene, especially in uncircumcised men. Not cleaning the glans penis and foreskin well can result in buildup of bacteria, viruses, and yeast, which can lead to infection and inflammation.  Irritation and lack of air flow due to fluid (smegma) that can build up on the glans penis.  Other causes include:  Chemical irritation from products such as soaps or shower gels (especially those that have fragrance), condoms, personal lubricants, petroleum jelly, spermicides, or fabric softeners.  Skin conditions, such as eczema, dermatitis, and psoriasis.  Allergies to medicines, such as tetracycline and sulfa drugs.  Certain medical conditions, including liver cirrhosis, congestive heart failure, diabetes, and kidney disease.  Infections, such as candidiasis, HPV (human papillomavirus), herpes simplex, gonorrhea, and syphilis.  Severe obesity.  What increases the risk? The following factors may make you more likely to  develop this condition:  Having diabetes. This is the most common risk factor.  Having a tight foreskin that is difficult to pull back (retract) past the glans.  Having sexual intercourse without using a condom.  What are the signs or symptoms? Symptoms of this condition include:  Discharge from under the foreskin.  A bad smell.  Pain or difficulty retracting the foreskin.  Tenderness, redness, and swelling of the glans.  A rash or sores on the glans or foreskin.  Itchiness.  Inability to get an erection due to pain.  Difficulty urinating.  Scarring of the penis or foreskin, in some cases.  How is this diagnosed? This condition may be diagnosed based on:  A physical exam.  Testing a swab of discharge to check for bacterial or fungal infection.  Blood tests: ? To check for viruses that can cause balanitis. ? To check your blood sugar (glucose) level. High blood glucose could be a sign of diabetes, which can cause balanitis.  How is this treated? Treatment for balanitis depends on the cause. Treatment may include:  Improving personal hygiene. Your health care provider may recommend sitting in a bath of warm water that is deep enough to cover your hips and buttocks (sitz bath).  Medicines such as: ? Creams or ointments to reduce swelling (steroids) or to treat an infection. ? Antibiotic medicine. ? Antifungal medicine.  Surgery to remove or cut the foreskin (circumcision). This may be done if you have scarring on the foreskin that makes it difficult to retract.  Controlling other medical problems that may be causing your condition or making it worse.  Follow these instructions at home:  Do not have sex until the condition clears up, or until your  health care provider approves.  Keep your penis clean and dry. Take sitz baths as recommended by your health care provider.  Avoid products that irritate your skin or make symptoms worse, such as soaps and shower gels  that have fragrance.  Take over-the-counter and prescription medicines only as told by your health care provider. ? If you were prescribed an antibiotic medicine or a cream or ointment, use it as told by your health care provider. Do not stop using your medicine, cream, or ointment even if you start to feel better. ? Do not drive or use heavy machinery while taking prescription pain medicine. Contact a health care provider if:  Your symptoms get worse or do not improve with home care.  You develop chills or a fever.  You have trouble urinating.  You cannot retract your foreskin. Get help right away if:  You develop severe pain.  You are unable to urinate. Summary  Balanitis is inflammation of the head of the penis (glans penis) caused by irritation or infection.  Balanitis causes pain, redness, and swelling of the glans penis.  This condition is most common among uncircumcised men who do not keep their glans penis clean and in men who have diabetes.  Treatment may include creams or ointments.  Good hygiene is important for prevention. This includes pulling back the foreskin when washing your penis. This information is not intended to replace advice given to you by your health care provider. Make sure you discuss any questions you have with your health care provider. Document Released: 05/25/2008 Document Revised: 11/26/2015 Document Reviewed: 11/26/2015 Elsevier Interactive Patient Education  2017 [employer].

## 2018-03-02 DIAGNOSIS — G8929 Other chronic pain: Secondary | ICD-10-CM | POA: Diagnosis not present

## 2018-03-02 DIAGNOSIS — M961 Postlaminectomy syndrome, not elsewhere classified: Secondary | ICD-10-CM | POA: Diagnosis not present

## 2018-03-02 DIAGNOSIS — M533 Sacrococcygeal disorders, not elsewhere classified: Secondary | ICD-10-CM | POA: Diagnosis not present

## 2018-03-02 DIAGNOSIS — M48061 Spinal stenosis, lumbar region without neurogenic claudication: Secondary | ICD-10-CM | POA: Diagnosis not present

## 2018-04-03 DIAGNOSIS — L089 Local infection of the skin and subcutaneous tissue, unspecified: Secondary | ICD-10-CM | POA: Diagnosis not present

## 2018-04-03 DIAGNOSIS — A4902 Methicillin resistant Staphylococcus aureus infection, unspecified site: Secondary | ICD-10-CM | POA: Diagnosis not present

## 2018-04-03 DIAGNOSIS — L03116 Cellulitis of left lower limb: Secondary | ICD-10-CM | POA: Diagnosis not present

## 2018-04-03 DIAGNOSIS — M009 Pyogenic arthritis, unspecified: Secondary | ICD-10-CM | POA: Insufficient documentation

## 2018-04-03 DIAGNOSIS — M00072 Staphylococcal arthritis, left ankle and foot: Secondary | ICD-10-CM | POA: Diagnosis not present

## 2018-04-03 DIAGNOSIS — M869 Osteomyelitis, unspecified: Secondary | ICD-10-CM | POA: Diagnosis not present

## 2018-04-03 DIAGNOSIS — I10 Essential (primary) hypertension: Secondary | ICD-10-CM | POA: Diagnosis not present

## 2018-04-03 DIAGNOSIS — M868X7 Other osteomyelitis, ankle and foot: Secondary | ICD-10-CM | POA: Diagnosis not present

## 2018-04-03 DIAGNOSIS — M549 Dorsalgia, unspecified: Secondary | ICD-10-CM | POA: Diagnosis not present

## 2018-04-03 DIAGNOSIS — G629 Polyneuropathy, unspecified: Secondary | ICD-10-CM | POA: Diagnosis not present

## 2018-04-03 DIAGNOSIS — S91302A Unspecified open wound, left foot, initial encounter: Secondary | ICD-10-CM | POA: Insufficient documentation

## 2018-04-03 DIAGNOSIS — L02612 Cutaneous abscess of left foot: Secondary | ICD-10-CM | POA: Diagnosis not present

## 2018-04-03 DIAGNOSIS — B9562 Methicillin resistant Staphylococcus aureus infection as the cause of diseases classified elsewhere: Secondary | ICD-10-CM | POA: Diagnosis not present

## 2018-04-03 DIAGNOSIS — E871 Hypo-osmolality and hyponatremia: Secondary | ICD-10-CM | POA: Diagnosis not present

## 2018-04-03 DIAGNOSIS — L97522 Non-pressure chronic ulcer of other part of left foot with fat layer exposed: Secondary | ICD-10-CM | POA: Diagnosis not present

## 2018-04-03 DIAGNOSIS — N179 Acute kidney failure, unspecified: Secondary | ICD-10-CM | POA: Diagnosis not present

## 2018-04-03 DIAGNOSIS — L97529 Non-pressure chronic ulcer of other part of left foot with unspecified severity: Secondary | ICD-10-CM | POA: Diagnosis not present

## 2018-04-03 DIAGNOSIS — M00079 Staphylococcal arthritis, unspecified ankle and foot: Secondary | ICD-10-CM | POA: Diagnosis not present

## 2018-04-03 DIAGNOSIS — E860 Dehydration: Secondary | ICD-10-CM | POA: Diagnosis not present

## 2018-04-03 DIAGNOSIS — G8929 Other chronic pain: Secondary | ICD-10-CM | POA: Diagnosis not present

## 2018-04-03 DIAGNOSIS — D729 Disorder of white blood cells, unspecified: Secondary | ICD-10-CM | POA: Diagnosis not present

## 2018-04-03 HISTORY — DX: Unspecified open wound, left foot, initial encounter: S91.302A

## 2018-04-03 HISTORY — DX: Pyogenic arthritis, unspecified: M00.9

## 2018-04-06 ENCOUNTER — Telehealth: Payer: Self-pay | Admitting: Family Medicine

## 2018-04-06 NOTE — Telephone Encounter (Signed)
Copied from [place] [phone]. Topic: Quick Communication - See Telephone Encounter >> Apr 06, 2018  5:05 PM Rutherford Nail, NT wrote: CRM for notification. See Telephone encounter for: 04/06/18. Dr Zachery Conch with Infectious Diseases in [place], [place] calling and is requesting to speak to Dr Jonni Sanger about their mutual patient. Would like a call back once she returns to the office. CB#: [phone]

## 2018-04-09 ENCOUNTER — Ambulatory Visit (INDEPENDENT_AMBULATORY_CARE_PROVIDER_SITE_OTHER): Payer: Medicare HMO | Admitting: Physician Assistant

## 2018-04-09 ENCOUNTER — Encounter (INDEPENDENT_AMBULATORY_CARE_PROVIDER_SITE_OTHER): Payer: Self-pay

## 2018-04-09 ENCOUNTER — Ambulatory Visit (INDEPENDENT_AMBULATORY_CARE_PROVIDER_SITE_OTHER): Payer: Medicare HMO

## 2018-04-09 ENCOUNTER — Other Ambulatory Visit: Payer: Self-pay

## 2018-04-09 ENCOUNTER — Other Ambulatory Visit (INDEPENDENT_AMBULATORY_CARE_PROVIDER_SITE_OTHER): Payer: Self-pay | Admitting: Orthopedic Surgery

## 2018-04-09 ENCOUNTER — Ambulatory Visit (INDEPENDENT_AMBULATORY_CARE_PROVIDER_SITE_OTHER): Payer: Medicare HMO | Admitting: Orthopedic Surgery

## 2018-04-09 ENCOUNTER — Encounter (HOSPITAL_COMMUNITY): Payer: Self-pay | Admitting: *Deleted

## 2018-04-09 ENCOUNTER — Encounter (INDEPENDENT_AMBULATORY_CARE_PROVIDER_SITE_OTHER): Payer: Self-pay | Admitting: Orthopedic Surgery

## 2018-04-09 ENCOUNTER — Encounter: Payer: Self-pay | Admitting: Physician Assistant

## 2018-04-09 VITALS — BP 118/82 | HR 84 | Temp 97.8°F | Resp 16 | Ht 72.0 in | Wt 215.0 lb

## 2018-04-09 VITALS — Ht 72.0 in | Wt 215.0 lb

## 2018-04-09 DIAGNOSIS — L02612 Cutaneous abscess of left foot: Secondary | ICD-10-CM

## 2018-04-09 DIAGNOSIS — N179 Acute kidney failure, unspecified: Secondary | ICD-10-CM

## 2018-04-09 DIAGNOSIS — M86272 Subacute osteomyelitis, left ankle and foot: Secondary | ICD-10-CM

## 2018-04-09 DIAGNOSIS — M79672 Pain in left foot: Secondary | ICD-10-CM

## 2018-04-09 DIAGNOSIS — I10 Essential (primary) hypertension: Secondary | ICD-10-CM | POA: Diagnosis not present

## 2018-04-09 DIAGNOSIS — A4902 Methicillin resistant Staphylococcus aureus infection, unspecified site: Secondary | ICD-10-CM | POA: Diagnosis not present

## 2018-04-09 DIAGNOSIS — M869 Osteomyelitis, unspecified: Secondary | ICD-10-CM | POA: Diagnosis not present

## 2018-04-09 HISTORY — DX: Pain in left foot: M79.672

## 2018-04-09 HISTORY — DX: Cutaneous abscess of left foot: L02.612

## 2018-04-09 LAB — SEDIMENTATION RATE: Sed Rate: 61 mm/hr — ABNORMAL HIGH (ref 0–20)

## 2018-04-09 LAB — CBC WITH DIFFERENTIAL/PLATELET
Basophils Absolute: 0.2 10*3/uL — ABNORMAL HIGH (ref 0.0–0.1)
Basophils Relative: 1.2 % (ref 0.0–3.0)
EOS ABS: 0.6 10*3/uL (ref 0.0–0.7)
EOS PCT: 4.7 % (ref 0.0–5.0)
HCT: 43 % (ref 39.0–52.0)
HEMOGLOBIN: 14.6 g/dL (ref 13.0–17.0)
LYMPHS ABS: 1 10*3/uL (ref 0.7–4.0)
Lymphocytes Relative: 7.6 % — ABNORMAL LOW (ref 12.0–46.0)
MCHC: 33.9 g/dL (ref 30.0–36.0)
MCV: 90.4 fl (ref 78.0–100.0)
MONO ABS: 1.8 10*3/uL — AB (ref 0.1–1.0)
Monocytes Relative: 13.4 % — ABNORMAL HIGH (ref 3.0–12.0)
NEUTROS PCT: 73.1 % (ref 43.0–77.0)
Neutro Abs: 9.8 10*3/uL — ABNORMAL HIGH (ref 1.4–7.7)
Platelets: 329 10*3/uL (ref 150.0–400.0)
RBC: 4.76 Mil/uL (ref 4.22–5.81)
RDW: 13.7 % (ref 11.5–15.5)
WBC: 13.4 10*3/uL — AB (ref 4.0–10.5)

## 2018-04-09 LAB — COMPREHENSIVE METABOLIC PANEL
ALK PHOS: 67 U/L (ref 39–117)
ALT: 30 U/L (ref 0–53)
AST: 26 U/L (ref 0–37)
Albumin: 3.7 g/dL (ref 3.5–5.2)
BUN: 29 mg/dL — ABNORMAL HIGH (ref 6–23)
CALCIUM: 8.9 mg/dL (ref 8.4–10.5)
CHLORIDE: 98 meq/L (ref 96–112)
CO2: 24 meq/L (ref 19–32)
Creatinine, Ser: 1.98 mg/dL — ABNORMAL HIGH (ref 0.40–1.50)
GFR: 34.04 mL/min — AB (ref >60.00)
GLUCOSE: 90 mg/dL (ref 70–99)
Potassium: 4.7 mEq/L (ref 3.5–5.1)
Sodium: 134 mEq/L — ABNORMAL LOW (ref 135–145)
Total Bilirubin: 0.5 mg/dL (ref 0.2–1.2)
Total Protein: 6.9 g/dL (ref 6.0–8.3)

## 2018-04-09 LAB — CK: CK TOTAL: 68 U/L (ref 7–232)

## 2018-04-09 LAB — C-REACTIVE PROTEIN: CRP: 2.8 mg/dL (ref 0.5–20.0)

## 2018-04-09 NOTE — Anesthesia Preprocedure Evaluation (Addendum)
Anesthesia Evaluation  Patient identified by MRN, date of birth, ID band Patient awake    Reviewed: Allergy & Precautions, H&P , NPO status , Patient's Chart, lab work & pertinent test results, reviewed documented beta blocker date and time   Airway Mallampati: II  TM Distance: >3 FB Neck ROM: Full    Dental no notable dental hx. (+) Teeth Intact, Dental Advisory Given   Pulmonary sleep apnea , former smoker,    Pulmonary exam normal breath sounds clear to auscultation       Cardiovascular hypertension, Pt. on medications and Pt. on home beta blockers  Rhythm:Regular Rate:Normal     Neuro/Psych Anxiety Depression Bipolar Disorder negative neurological ROS     GI/Hepatic negative GI ROS, Neg liver ROS,   Endo/Other  negative endocrine ROS  Renal/GU negative Renal ROS  negative genitourinary   Musculoskeletal  (+) Arthritis , Osteoarthritis,  Fibromyalgia -  Abdominal   Peds  Hematology negative hematology ROS (+)   Anesthesia Other Findings   Reproductive/Obstetrics negative OB ROS                            Anesthesia Physical Anesthesia Plan  ASA: III  Anesthesia Plan: General   Post-op Pain Management:    Induction: Intravenous  PONV Risk Score and Plan: 3 and Ondansetron, Dexamethasone and Midazolam  Airway Management Planned: Oral ETT  Additional Equipment:   Intra-op Plan:   Post-operative Plan: Extubation in OR  Informed Consent: I have reviewed the patients History and Physical, chart, labs and discussed the procedure including the risks, benefits and alternatives for the proposed anesthesia with the patient or authorized representative who has indicated his/her understanding and acceptance.     Dental advisory given  Plan Discussed with: CRNA  Anesthesia Plan Comments:         Anesthesia Quick Evaluation

## 2018-04-09 NOTE — Progress Notes (Signed)
Office Visit Note   Patient: William Solis           Date of Birth: [birth year]           MRN: [id number] Visit Date: 04/09/2018              Requested by: Brunetta Jeans, PA-C [address] [place] [address] [place],  [id number] PCP: Leamon Arnt, MD  Chief Complaint  Patient presents with  . Left Foot - Open Wound    I&D in [facility] 04/03/2018      HPI: Patient is a 68 year old gentleman who states he has had an ulcer beneath the third metatarsal head of the left foot.  He was in [place] the foot became red and swollen.  Patient did go seek medical care and underwent a second ray amputation has currently been using silver alginate dressings he does have a PICC line for daptomycin however this was not covered by insurance and is currently taken Bactrim DS.  The second ray was resected with an ulcer beneath the third metatarsal head.  Patient complains of maceration and bloody drainage.  Assessment & Plan: Visit Diagnoses:  1. Left foot pain   2. Subacute osteomyelitis, left ankle and foot ([place])   3. Cutaneous abscess of left foot     Plan: Patient has a large necrotic ulcer over the forefoot with cellulitis involving the forefoot and midfoot.  Discussed treatment options and I feel the only safe option is to proceed with a transmetatarsal amputation.  Discussed that the infection extends more proximally we would have to consider additional surgery.  Discussed that with patient's cellulitis in the foot and necrotic ulcer that it is safest to proceed with surgery as soon as possible.  We will plan for surgery tomorrow morning.  Plan for overnight observation with discharge to home with a wound VAC.  Risks and benefits were discussed including risk of the wound not healing.  Follow-Up Instructions: Return in about 1 week (around 04/16/2018).   Ortho Exam  Patient is alert, oriented, no adenopathy, well-dressed, normal affect, normal respiratory effort. Examination patient has a  palpable dorsalis pedis pulse.  The Doppler was used and he has a strong triphasic posterior tibial pulse and a triphasic dorsalis pedis pulse.  Examination patient has a large necrotic ulcer over the second ray amputation the ulcer has exposed bone from the third metatarsal and third toe dorsally with necrotic tendon.  The ulcer beneath the third metatarsal head probes directly to the third metatarsal head as well as the fourth metatarsal head.  There is necrotic tissue that extends to the border of the great toe.  There is cellulitis extending into the midfoot.  Imaging: Xr Foot 2 Views Left  Result Date: 04/09/2018 2 view radiographs of the left foot shows a dislocation of the MTP joint third toe previous second ray amputation there is no air or gas in the soft tissue.  No images are attached to the encounter.  Labs: Lab Results  Component Value Date   ESRSEDRATE 61 (H) 04/09/2018   ESRSEDRATE 30 (H) 10/19/2013   CRP 2.8 04/09/2018   REPTSTATUS 10/28/2013 FINAL 10/22/2013   GRAMSTAIN  10/19/2013    ABUNDANT WBC PRESENT,BOTH PMN AND MONONUCLEAR NO SQUAMOUS EPITHELIAL CELLS SEEN RARE GRAM POSITIVE COCCI IN PAIRS IN CLUSTERS Performed at [facility]  10/22/2013    NO GROWTH 5 DAYS Performed at [facility] 10/19/2013  Lab Results  Component Value Date   ALBUMIN 3.7 04/09/2018   ALBUMIN 4.2 06/02/2017   ALBUMIN 3.3 (L) 02/16/2015    Body mass index is 29.16 kg/m.  Orders:  Orders Placed This Encounter  Procedures  . XR Foot 2 Views Left   No orders of the defined types were placed in this encounter.    Procedures: No procedures performed  Clinical Data: No additional findings.  ROS:  All other systems negative, except as noted in the HPI. Review of Systems  Objective: Vital Signs: Ht 6' (1.829 m)   Wt 215 lb (97.5 kg)   BMI 29.16 kg/m   Specialty Comments:  No specialty comments available.   PMFS History: Patient Active Problem List   Diagnosis Date Noted  . Left foot pain 04/09/2018  . Cutaneous abscess of left foot 04/09/2018  . Septic arthritis of left foot ([place]) 04/03/2018  . Open wound of left foot 04/03/2018  . Osteomyelitis of left foot ([place]) 04/03/2018  . Abscess of left foot 04/03/2018  . Postlaminectomy syndrome 04/01/2017  . Lumbar stenosis 03/09/2017  . Fibromyalgia 09/15/2016  . Risk for falls 06/25/2016  . OSA on CPAP 11/07/2015  . Bilateral edema of lower extremity 10/12/2015  . S/P removal of parathyroid gland ([place]) 12/21/2013  . History of hyperparathyroidism 11/22/2013  . History of osteomyelitis 10/25/2013  . Subacute osteomyelitis, left ankle and foot ([place]) 10/19/2013  . Chronic low back pain 05/12/2012  . Narcotic dependence ([place]) 05/12/2012  . History of hepatitis C 04/19/2010  . Hypertension 04/19/2010  . Dry eye syndrome 11/20/2008  . MDD (major depressive disorder) 11/20/2008  . ADD (attention deficit disorder) 12/01/2006   Past Medical History:  Diagnosis Date  . ADHD, adult residual type    takes Ritalin daily  . Anxiety   . Depression    takes Lexapro daily  . Hepatitis C    resolved with treatment 2019. released from hepatology  . Hypertension    takes Lisinopril,Metoprolol,and Amlodipine daily  . Insomnia    takes Melatonin nightly  . Muscle spasm    takes Flexeril daily  . Neuropathy involving both lower extremities    takes Gabapentin daily    Family History  Problem Relation Age of Onset  . Hypertension Mother   . Hypertension Father   . Diabetes type II Neg Hx     Past Surgical History:  Procedure Laterality Date  . AMPUTATION Left 10/21/2013   Procedure: LEFT FOOT SECOND RAY AMPUTATION ;  Surgeon: [name], MD;  Location: [facility];  Service: Orthopedics;  Laterality: Left;  . BACK SURGERY  2006   Disc  . PARATHYROIDECTOMY  12/21/2013   dr Rosendo Gros  . PARATHYROIDECTOMY N/A 12/21/2013   Procedure:  PARATHYROIDECTOMY;  Surgeon: Ralene Ok, MD;  Location: [facility];  Service: General;  Laterality: N/A;  . SINUS EXPLORATION  2005/2007  . TEE WITHOUT CARDIOVERSION N/A 10/24/2013   Procedure: TRANSESOPHAGEAL ECHOCARDIOGRAM (TEE);  Surgeon: Fay Records, MD;  Location: [facility] ENDOSCOPY;  Service: Cardiovascular;  Laterality: N/A;   Social History   Occupational History  . Occupation: retired     Fish farm manager: [employer]  Tobacco Use  . Smoking status: Former Smoker    Types: Cigarettes, Cigars    Last attempt to quit: 10/20/1990    Years since quitting: 27.4  . Smokeless tobacco: Never Used  Substance and Sexual Activity  . Alcohol use: No    Alcohol/week: 0.0 standard drinks  Frequency: Never  . Drug use: No  . Sexual activity: Never

## 2018-04-09 NOTE — Progress Notes (Addendum)
Patient presents to clinic today for hospital follow-up. Patient is established here at office but this is my first time meeting and assessing patient.  Outside records from [place] were reviewed in significant detail for this appointment.   Patient was seen at [facility] in [place] on 04/03/2018. (Records are available for review in care everywhere). Patient presented with significant erythema, pain and swelling of his left foot associated with an ongoing foot ulcer. ER workup at that time included vitals (hypotensive but afebrile, w/o tachycardia), labs (leukoctyosis with 17.78 with left shift, mild anemia with hgb 13.3, hyponatremia 129, AKI with creatinine at 1.3, hypocalcemia of 8.5, hypoalbuminemia, elevated ESR 52, normal lactic acid level)), CT extremity revealing (septic arthritis of 2nd metatarsal phalangeal joint and concern for osteomyelitis and communicating abscess). IV fluids, IV analgesics, IV antibiotics (Zosyn and Vancomycin). Blood cultures obtained in ER before admission. Patient subsequently admitted to the hospital for further assessment and management.   During hospitalization, patient underwent Orthopedic consult with I/D and debridement on 3/15. Wound cultures obtained growing MRSA. Patient continued on Vancomycin IV and transitioned to oral antibiotic. Had to be given Bactrim BID as insurance would not cover home IV Daptomycin. Was discharged home to follow-up with Orthopedics and Wound Care here today.  Since discharge on 04/06/2018, patient endorses no noted fever. Still fatigued and noting decreased appetite but this is improving. Notes normal bladder and bowel output. He is concerned about the wound. Has been bandaged since discharge from hospital. Is needing dressing change, PICC line removal and to be set up with Ortho and Wound.  Past Medical History:  Diagnosis Date  . ADHD, adult residual type    takes Ritalin daily  . Anxiety   .  Depression    takes Lexapro daily  . Hepatitis C    resolved with treatment 2019. released from hepatology  . Hypertension    takes Lisinopril,Metoprolol,and Amlodipine daily  . Insomnia    takes Melatonin nightly  . Muscle spasm    takes Flexeril daily  . Neuropathy involving both lower extremities    takes Gabapentin daily    Current Outpatient Medications on File Prior to Visit  Medication Sig Dispense Refill  . amLODipine (NORVASC) 10 MG tablet Take 1 tablet (10 mg total) by mouth daily. 90 tablet 3  . Ascorbic Acid (VITAMIN C) 1000 MG tablet Take 1,000 mg by mouth daily.    . carvedilol (COREG) 25 MG tablet Take 1 tablet (25 mg total) by mouth 2 (two) times daily with a meal. 180 tablet 3  . citalopram (CELEXA) 20 MG tablet Take 1 tablet (20 mg total) by mouth daily. 90 tablet 3  . cyclobenzaprine (FLEXERIL) 10 MG tablet Take 5 mg by mouth 3 (three) times daily as needed for muscle spasms.     Marland Kitchen gabapentin (NEURONTIN) 600 MG tablet Take 600 mg by mouth at bedtime as needed (pain).     . Glucosamine-Chondroit-Vit C-Mn (GLUCOSAMINE 1500 COMPLEX PO) Take by mouth.    . hydrochlorothiazide (HYDRODIURIL) 25 MG tablet Take 1 tablet (25 mg total) by mouth daily. 90 tablet 3  . lisinopril (PRINIVIL,ZESTRIL) 40 MG tablet Take 1 tablet (40 mg total) by mouth daily. 90 tablet 3  . Melatonin 5 MG TABS Take 0.5 tablets by mouth at bedtime.     . meloxicam (MOBIC) 15 MG tablet Take 15 mg by mouth daily. As needed for pain in thumbs    . Multiple Vitamins-Minerals (MULTIVITAMIN WITH MINERALS) tablet  Take 1 tablet by mouth daily.    . Omega-3 Fatty Acids (FISH OIL PO) Take 1,000 mg by mouth daily.     . Oxycodone HCl 10 MG TABS Take 1 tablet (10 mg total) by mouth 3 (three) times daily. 90 tablet 0  . sulfamethoxazole-trimethoprim (BACTRIM DS,SEPTRA DS) 800-160 MG tablet Take 1 tablet by mouth 2 (two) times daily.     No current facility-administered medications on file prior to visit.     No  Known Allergies  Family History  Problem Relation Age of Onset  . Hypertension Mother   . Hypertension Father   . Diabetes type II Neg Hx     Social History   Socioeconomic History  . Marital status: Married    Spouse name: Not on file  . Number of children: Not on file  . Years of education: Not on file  . Highest education level: Not on file  Occupational History  . Occupation: retired     Fish farm manager: [employer]  Social Needs  . Financial resource strain: Not on file  . Food insecurity:    Worry: Not on file    Inability: Not on file  . Transportation needs:    Medical: Not on file    Non-medical: Not on file  Tobacco Use  . Smoking status: Former Smoker    Types: Cigarettes, Cigars    Last attempt to quit: 10/20/1990    Years since quitting: 27.4  . Smokeless tobacco: Never Used  Substance and Sexual Activity  . Alcohol use: No    Alcohol/week: 0.0 standard drinks    Frequency: Never  . Drug use: No  . Sexual activity: Never  Lifestyle  . Physical activity:    Days per week: Not on file    Minutes per session: Not on file  . Stress: Not on file  Relationships  . Social connections:    Talks on phone: Not on file    Gets together: Not on file    Attends religious service: Not on file    Active member of club or organization: Not on file    Attends meetings of clubs or organizations: Not on file    Relationship status: Not on file  Other Topics Concern  . Not on file  Social History Narrative  . Not on file   Review of Systems - See HPI.  All other ROS are negative.  BP 118/82   Pulse 84   Temp 97.8 F (36.6 C) (Oral)   Resp 16   Ht 6' (1.829 m)   Wt 215 lb (97.5 kg)   SpO2 99%   BMI 29.16 kg/m   Physical Exam Vitals signs reviewed.  Constitutional:      Appearance: Normal appearance.  HENT:     Head: Normocephalic and atraumatic.     Right Ear: Tympanic membrane normal.     Left Ear: Tympanic membrane normal.     Nose: Nose  normal.     Mouth/Throat:     Mouth: Mucous membranes are moist.  Neck:     Musculoskeletal: Neck supple.  Cardiovascular:     Rate and Rhythm: Normal rate and regular rhythm.     Pulses: Normal pulses.     Heart sounds: Normal heart sounds.  Pulmonary:     Effort: Pulmonary effort is normal.     Breath sounds: Normal breath sounds.  Neurological:     General: No focal deficit present.  Mental Status: He is alert and oriented to person, place, and time.  Psychiatric:        Mood and Affect: Mood normal.    Assessment/Plan: 1. Osteomyelitis of left foot, unspecified type ([place]) 2. MRSA infection Urgent appointment with Ortho placed today -- Dr. Sharol Given. Scheduled for a few hours from now as he needs STAT assessment of this chronic wound and to reassess need for Daptomycin versus other therapy. He is stable here and normal vital signs. Will reassess labs per Hospitalist recommendation.  - AMB referral to orthopedics - AMB referral to wound care center - CBC w/Diff - CK (Creatine Kinase) - Sedimentation rate - Comp Met (CMET) - C-reactive protein  3. Essential hypertension BP stable here in office.  Continue current regimen. Repeat labs today.   Leeanne Rio, PA-C

## 2018-04-10 ENCOUNTER — Observation Stay (HOSPITAL_COMMUNITY)
Admission: RE | Admit: 2018-04-10 | Discharge: 2018-04-11 | Disposition: A | Discharge status: Home / Self Care | Payer: Medicare HMO | Attending: Orthopedic Surgery | Admitting: Orthopedic Surgery

## 2018-04-10 ENCOUNTER — Ambulatory Visit (HOSPITAL_COMMUNITY): Payer: Medicare HMO | Admitting: Anesthesiology

## 2018-04-10 ENCOUNTER — Ambulatory Visit (HOSPITAL_COMMUNITY): Payer: Medicare HMO

## 2018-04-10 ENCOUNTER — Encounter (HOSPITAL_COMMUNITY)
Admission: RE | Disposition: A | Discharge status: Home / Self Care | Payer: Self-pay | Source: Home / Self Care | Attending: Orthopedic Surgery

## 2018-04-10 ENCOUNTER — Encounter (HOSPITAL_COMMUNITY): Payer: Self-pay | Admitting: *Deleted

## 2018-04-10 DIAGNOSIS — M86272 Subacute osteomyelitis, left ankle and foot: Secondary | ICD-10-CM | POA: Diagnosis not present

## 2018-04-10 DIAGNOSIS — L97529 Non-pressure chronic ulcer of other part of left foot with unspecified severity: Secondary | ICD-10-CM | POA: Diagnosis not present

## 2018-04-10 DIAGNOSIS — Z87891 Personal history of nicotine dependence: Secondary | ICD-10-CM | POA: Insufficient documentation

## 2018-04-10 DIAGNOSIS — F319 Bipolar disorder, unspecified: Secondary | ICD-10-CM | POA: Diagnosis not present

## 2018-04-10 DIAGNOSIS — L03116 Cellulitis of left lower limb: Secondary | ICD-10-CM | POA: Insufficient documentation

## 2018-04-10 DIAGNOSIS — I10 Essential (primary) hypertension: Secondary | ICD-10-CM | POA: Insufficient documentation

## 2018-04-10 DIAGNOSIS — G4700 Insomnia, unspecified: Secondary | ICD-10-CM | POA: Diagnosis not present

## 2018-04-10 DIAGNOSIS — F419 Anxiety disorder, unspecified: Secondary | ICD-10-CM | POA: Diagnosis not present

## 2018-04-10 DIAGNOSIS — M1990 Unspecified osteoarthritis, unspecified site: Secondary | ICD-10-CM | POA: Diagnosis not present

## 2018-04-10 DIAGNOSIS — M869 Osteomyelitis, unspecified: Secondary | ICD-10-CM | POA: Diagnosis not present

## 2018-04-10 DIAGNOSIS — Z452 Encounter for adjustment and management of vascular access device: Secondary | ICD-10-CM | POA: Diagnosis not present

## 2018-04-10 DIAGNOSIS — L02612 Cutaneous abscess of left foot: Secondary | ICD-10-CM | POA: Diagnosis not present

## 2018-04-10 DIAGNOSIS — Z79899 Other long term (current) drug therapy: Secondary | ICD-10-CM | POA: Insufficient documentation

## 2018-04-10 DIAGNOSIS — G629 Polyneuropathy, unspecified: Secondary | ICD-10-CM | POA: Insufficient documentation

## 2018-04-10 DIAGNOSIS — Z89432 Acquired absence of left foot: Secondary | ICD-10-CM

## 2018-04-10 HISTORY — PX: AMPUTATION: SHX166

## 2018-04-10 HISTORY — DX: Unspecified osteoarthritis, unspecified site: M19.90

## 2018-04-10 HISTORY — DX: Bipolar disorder, unspecified: F31.9

## 2018-04-10 HISTORY — DX: Pneumonia, unspecified organism: J18.9

## 2018-04-10 SURGERY — AMPUTATION, FOOT, PARTIAL
Anesthesia: General | Site: Foot | Laterality: Left

## 2018-04-10 MED ORDER — GABAPENTIN 300 MG PO CAPS
300.0000 mg | ORAL_CAPSULE | Freq: Three times a day (TID) | ORAL | Status: DC
  Administered 2018-04-10 – 2018-04-11 (×4): 300 mg via ORAL
  Filled 2018-04-10 (×4): qty 1

## 2018-04-10 MED ORDER — CEFAZOLIN SODIUM-DEXTROSE 2-4 GM/100ML-% IV SOLN
2.0000 g | INTRAVENOUS | Status: AC
  Administered 2018-04-10: 2 g via INTRAVENOUS
  Filled 2018-04-10: qty 100

## 2018-04-10 MED ORDER — DOCUSATE SODIUM 100 MG PO CAPS
100.0000 mg | ORAL_CAPSULE | Freq: Two times a day (BID) | ORAL | Status: DC
  Administered 2018-04-10 – 2018-04-11 (×2): 100 mg via ORAL
  Filled 2018-04-10 (×3): qty 1

## 2018-04-10 MED ORDER — PHENYLEPHRINE 40 MCG/ML (10ML) SYRINGE FOR IV PUSH (FOR BLOOD PRESSURE SUPPORT)
PREFILLED_SYRINGE | INTRAVENOUS | Status: DC | PRN
  Administered 2018-04-10 (×2): 120 ug via INTRAVENOUS

## 2018-04-10 MED ORDER — METHOCARBAMOL 500 MG PO TABS
500.0000 mg | ORAL_TABLET | Freq: Four times a day (QID) | ORAL | Status: DC | PRN
  Administered 2018-04-10: 500 mg via ORAL
  Filled 2018-04-10: qty 1

## 2018-04-10 MED ORDER — 0.9 % SODIUM CHLORIDE (POUR BTL) OPTIME
TOPICAL | Status: DC | PRN
  Administered 2018-04-10: 1000 mL

## 2018-04-10 MED ORDER — EPHEDRINE 5 MG/ML INJ
INTRAVENOUS | Status: AC
  Filled 2018-04-10: qty 10

## 2018-04-10 MED ORDER — EPHEDRINE SULFATE-NACL 50-0.9 MG/10ML-% IV SOSY
PREFILLED_SYRINGE | INTRAVENOUS | Status: DC | PRN
  Administered 2018-04-10: 10 mg via INTRAVENOUS
  Administered 2018-04-10: 15 mg via INTRAVENOUS
  Administered 2018-04-10: 10 mg via INTRAVENOUS
  Administered 2018-04-10: 15 mg via INTRAVENOUS

## 2018-04-10 MED ORDER — PROPOFOL 10 MG/ML IV BOLUS
INTRAVENOUS | Status: AC
  Filled 2018-04-10: qty 20

## 2018-04-10 MED ORDER — HYDROMORPHONE HCL 1 MG/ML IJ SOLN: 0.2500 mg | INTRAMUSCULAR | Status: DC | PRN

## 2018-04-10 MED ORDER — METHOCARBAMOL 1000 MG/10ML IJ SOLN
500.0000 mg | Freq: Four times a day (QID) | INTRAVENOUS | Status: DC | PRN
  Filled 2018-04-10: qty 5

## 2018-04-10 MED ORDER — ACETAMINOPHEN 500 MG PO TABS
1000.0000 mg | ORAL_TABLET | Freq: Once | ORAL | Status: AC
  Administered 2018-04-10: 1000 mg via ORAL
  Filled 2018-04-10: qty 2

## 2018-04-10 MED ORDER — CHLORHEXIDINE GLUCONATE 4 % EX LIQD: 60.0000 mL | Freq: Once | CUTANEOUS | Status: DC

## 2018-04-10 MED ORDER — HYDROMORPHONE HCL 1 MG/ML IJ SOLN: 0.5000 mg | INTRAMUSCULAR | Status: DC | PRN

## 2018-04-10 MED ORDER — DEXAMETHASONE SODIUM PHOSPHATE 10 MG/ML IJ SOLN
INTRAMUSCULAR | Status: DC | PRN
  Administered 2018-04-10: 10 mg via INTRAVENOUS

## 2018-04-10 MED ORDER — ASPIRIN EC 325 MG PO TBEC
325.0000 mg | DELAYED_RELEASE_TABLET | Freq: Every day | ORAL | Status: DC
  Administered 2018-04-10 – 2018-04-11 (×2): 325 mg via ORAL
  Filled 2018-04-10 (×2): qty 1

## 2018-04-10 MED ORDER — SODIUM CHLORIDE 0.9 % IV SOLN
INTRAVENOUS | Status: DC
  Administered 2018-04-10: 12:00:00 via INTRAVENOUS

## 2018-04-10 MED ORDER — FENTANYL CITRATE (PF) 100 MCG/2ML IJ SOLN
INTRAMUSCULAR | Status: DC | PRN
  Administered 2018-04-10 (×2): 100 ug via INTRAVENOUS

## 2018-04-10 MED ORDER — CEFAZOLIN SODIUM-DEXTROSE 2-4 GM/100ML-% IV SOLN
2.0000 g | Freq: Four times a day (QID) | INTRAVENOUS | Status: AC
  Administered 2018-04-10 – 2018-04-11 (×3): 2 g via INTRAVENOUS
  Filled 2018-04-10 (×4): qty 100

## 2018-04-10 MED ORDER — SUCCINYLCHOLINE CHLORIDE 200 MG/10ML IV SOSY
PREFILLED_SYRINGE | INTRAVENOUS | Status: AC
  Filled 2018-04-10: qty 10

## 2018-04-10 MED ORDER — PHENYLEPHRINE 40 MCG/ML (10ML) SYRINGE FOR IV PUSH (FOR BLOOD PRESSURE SUPPORT)
PREFILLED_SYRINGE | INTRAVENOUS | Status: AC
  Filled 2018-04-10: qty 10

## 2018-04-10 MED ORDER — ONDANSETRON HCL 4 MG/2ML IJ SOLN: 4.0000 mg | Freq: Four times a day (QID) | INTRAMUSCULAR | Status: DC | PRN

## 2018-04-10 MED ORDER — METOCLOPRAMIDE HCL 5 MG PO TABS: 5.0000 mg | ORAL_TABLET | Freq: Three times a day (TID) | ORAL | Status: DC | PRN

## 2018-04-10 MED ORDER — SUCCINYLCHOLINE CHLORIDE 20 MG/ML IJ SOLN
INTRAMUSCULAR | Status: DC | PRN
  Administered 2018-04-10: 100 mg via INTRAVENOUS

## 2018-04-10 MED ORDER — MAGNESIUM CITRATE PO SOLN: 1.0000 | Freq: Once | ORAL | Status: DC | PRN

## 2018-04-10 MED ORDER — BISACODYL 10 MG RE SUPP: 10.0000 mg | Freq: Every day | RECTAL | Status: DC | PRN

## 2018-04-10 MED ORDER — ONDANSETRON HCL 4 MG/2ML IJ SOLN
4.0000 mg | Freq: Once | INTRAMUSCULAR | Status: AC
  Administered 2018-04-10: 4 mg via INTRAVENOUS
  Filled 2018-04-10 (×2): qty 2

## 2018-04-10 MED ORDER — ONDANSETRON HCL 4 MG PO TABS: 4.0000 mg | ORAL_TABLET | Freq: Four times a day (QID) | ORAL | Status: DC | PRN

## 2018-04-10 MED ORDER — POLYETHYLENE GLYCOL 3350 17 G PO PACK: 17.0000 g | PACK | Freq: Every day | ORAL | Status: DC | PRN

## 2018-04-10 MED ORDER — LIDOCAINE 2% (20 MG/ML) 5 ML SYRINGE
INTRAMUSCULAR | Status: AC
  Filled 2018-04-10: qty 5

## 2018-04-10 MED ORDER — MIDAZOLAM HCL 2 MG/2ML IJ SOLN
INTRAMUSCULAR | Status: AC
  Filled 2018-04-10: qty 2

## 2018-04-10 MED ORDER — ACETAMINOPHEN 325 MG PO TABS: 325.0000 mg | ORAL_TABLET | Freq: Four times a day (QID) | ORAL | Status: DC | PRN

## 2018-04-10 MED ORDER — OXYCODONE HCL 5 MG PO TABS
5.0000 mg | ORAL_TABLET | ORAL | Status: DC | PRN
  Administered 2018-04-10 – 2018-04-11 (×2): 10 mg via ORAL
  Filled 2018-04-10 (×3): qty 2

## 2018-04-10 MED ORDER — FENTANYL CITRATE (PF) 250 MCG/5ML IJ SOLN
INTRAMUSCULAR | Status: AC
  Filled 2018-04-10: qty 5

## 2018-04-10 MED ORDER — SODIUM CHLORIDE 0.9 % IV SOLN
INTRAVENOUS | Status: DC
  Administered 2018-04-10: 08:00:00 via INTRAVENOUS

## 2018-04-10 MED ORDER — METOCLOPRAMIDE HCL 5 MG/ML IJ SOLN: 5.0000 mg | Freq: Three times a day (TID) | INTRAMUSCULAR | Status: DC | PRN

## 2018-04-10 MED ORDER — MIDAZOLAM HCL 5 MG/5ML IJ SOLN
INTRAMUSCULAR | Status: DC | PRN
  Administered 2018-04-10: 2 mg via INTRAVENOUS

## 2018-04-10 MED ORDER — PROPOFOL 10 MG/ML IV BOLUS
INTRAVENOUS | Status: DC | PRN
  Administered 2018-04-10: 150 mg via INTRAVENOUS

## 2018-04-10 MED ORDER — ONDANSETRON HCL 4 MG/2ML IJ SOLN
INTRAMUSCULAR | Status: DC | PRN
  Administered 2018-04-10: 4 mg via INTRAVENOUS

## 2018-04-10 MED ORDER — OXYCODONE HCL 5 MG PO TABS
10.0000 mg | ORAL_TABLET | ORAL | Status: DC | PRN
  Administered 2018-04-10: 10 mg via ORAL

## 2018-04-10 SURGICAL SUPPLY — 36 items
BENZOIN TINCTURE PRP APPL 2/3 (GAUZE/BANDAGES/DRESSINGS) ×3 IMPLANT
BLADE SAW SGTL HD 18.5X60.5X1. (BLADE) ×3 IMPLANT
BLADE SURG 21 STRL SS (BLADE) ×3 IMPLANT
BNDG COHESIVE 4X5 TAN STRL (GAUZE/BANDAGES/DRESSINGS) ×3 IMPLANT
BNDG GAUZE ELAST 4 BULKY (GAUZE/BANDAGES/DRESSINGS) IMPLANT
COVER SURGICAL LIGHT HANDLE (MISCELLANEOUS) ×3 IMPLANT
COVER WAND RF STERILE (DRAPES) IMPLANT
DRAPE INCISE IOBAN 66X45 STRL (DRAPES) ×3 IMPLANT
DRAPE U-SHAPE 47X51 STRL (DRAPES) ×3 IMPLANT
DRESSING PEEL AND PLC PRVNA 13 (GAUZE/BANDAGES/DRESSINGS) ×1 IMPLANT
DRSG ADAPTIC 3X8 NADH LF (GAUZE/BANDAGES/DRESSINGS) IMPLANT
DRSG PAD ABDOMINAL 8X10 ST (GAUZE/BANDAGES/DRESSINGS) IMPLANT
DRSG PEEL AND PLACE PREVENA 13 (GAUZE/BANDAGES/DRESSINGS) ×3
DURAPREP 26ML APPLICATOR (WOUND CARE) IMPLANT
ELECT REM PT RETURN 9FT ADLT (ELECTROSURGICAL) ×3
ELECTRODE REM PT RTRN 9FT ADLT (ELECTROSURGICAL) ×1 IMPLANT
GAUZE SPONGE 4X4 12PLY STRL (GAUZE/BANDAGES/DRESSINGS) IMPLANT
GLOVE BIOGEL PI IND STRL 9 (GLOVE) ×1 IMPLANT
GLOVE BIOGEL PI INDICATOR 9 (GLOVE) ×2
GLOVE SURG ORTHO 9.0 STRL STRW (GLOVE) ×3 IMPLANT
GOWN STRL REUS W/ TWL XL LVL3 (GOWN DISPOSABLE) ×1 IMPLANT
GOWN STRL REUS W/TWL XL LVL3 (GOWN DISPOSABLE) ×2
KIT BASIN OR (CUSTOM PROCEDURE TRAY) ×3 IMPLANT
KIT DRSG PREVENA PLUS 7DAY 125 (MISCELLANEOUS) ×3 IMPLANT
KIT TURNOVER KIT B (KITS) ×3 IMPLANT
NS IRRIG 1000ML POUR BTL (IV SOLUTION) ×3 IMPLANT
PACK ORTHO EXTREMITY (CUSTOM PROCEDURE TRAY) ×3 IMPLANT
PAD ARMBOARD 7.5X6 YLW CONV (MISCELLANEOUS) ×6 IMPLANT
SPONGE LAP 18X18 RF (DISPOSABLE) IMPLANT
SUT ETHILON 2 0 PSLX (SUTURE) ×6 IMPLANT
TOWEL OR 17X24 6PK STRL BLUE (TOWEL DISPOSABLE) IMPLANT
TOWEL OR 17X26 10 PK STRL BLUE (TOWEL DISPOSABLE) ×3 IMPLANT
TUBE CONNECTING 12'X1/4 (SUCTIONS) ×1
TUBE CONNECTING 12X1/4 (SUCTIONS) ×2 IMPLANT
WATER STERILE IRR 1000ML POUR (IV SOLUTION) IMPLANT
YANKAUER SUCT BULB TIP NO VENT (SUCTIONS) ×3 IMPLANT

## 2018-04-10 NOTE — Anesthesia Procedure Notes (Signed)
Procedure Name: Intubation Date/Time: 04/10/2018 7:45 AM Performed by: Lance Coon, CRNA Pre-anesthesia Checklist: Patient identified, Emergency Drugs available, Suction available, Patient being monitored and Timeout performed Patient Re-evaluated:Patient Re-evaluated prior to induction Oxygen Delivery Method: Circle system utilized Preoxygenation: Pre-oxygenation with 100% oxygen Induction Type: IV induction Ventilation: Mask ventilation without difficulty Laryngoscope Size: Miller and 3 Grade View: Grade I Tube type: Oral Tube size: 7.5 mm Number of attempts: 1 Airway Equipment and Method: Stylet Placement Confirmation: ETT inserted through vocal cords under direct vision,  positive ETCO2 and breath sounds checked- equal and bilateral Secured at: 22 cm Tube secured with: Tape Dental Injury: Teeth and Oropharynx as per pre-operative assessment

## 2018-04-10 NOTE — Op Note (Signed)
04/10/2018  9:34 AM  PATIENT:  William Solis    PRE-OPERATIVE DIAGNOSIS:  Abscess Osteomyelitis Left Foot  POST-OPERATIVE DIAGNOSIS:  Same  PROCEDURE:  LEFT TRANSMETATARSAL AMPUTATION Application of Praveena wound VAC.  SURGEON:  [name], MD  PHYSICIAN ASSISTANT:None ANESTHESIA:   General  PREOPERATIVE INDICATIONS:  William Solis is a  68 y.o. male with a diagnosis of Abscess Osteomyelitis Left Foot who failed conservative measures and elected for surgical management.    The risks benefits and alternatives were discussed with the patient preoperatively including but not limited to the risks of infection, bleeding, nerve injury, cardiopulmonary complications, the need for revision surgery, among others, and the patient was willing to proceed.  OPERATIVE IMPLANTS: Praveena wound VAC  @ENCIMAGES @  OPERATIVE FINDINGS: Margins were clear calcified vessels  OPERATIVE PROCEDURE: Patient was brought the operating room and underwent a general anesthetic.  After adequate levels anesthesia were obtained patient's left lower extremity was prepped using ChloraPrep draped into a sterile field a timeout was called.  A fishmouth incision was made just proximal to the ulcerative tissue.  This was carried down to bone and a oscillating saw was used to perform a transmetatarsal amputation.  The wound was irrigated with normal saline there was no evidence of infection at the resection margins.  Electrocautery was used for hemostasis.  The incision was closed using 2-0 nylon a Praveena wound VAC was applied this had a good suction fit patient was extubated taken the PACU in stable condition.   DISCHARGE PLANNING:  Antibiotic duration: 24 hours  Weightbearing: Nonweightbearing on the left  Pain medication: Opioid pathway ordered with Neurontin  Dressing care/ Wound VAC: Continue wound VAC for 1 week  Ambulatory devices: Walker or crutches  Discharge to: Home when safe with  therapy  Follow-up: In the office 1 week post operative.

## 2018-04-10 NOTE — H&P (Signed)
William Solis is an 68 y.o. male.   Chief Complaint: Abscess ulceration left foot. HPI: Patient is a 68 year old gentleman who states he has had an ulcer beneath the third metatarsal head of the left foot.  He was in [place] the foot became red and swollen.  Patient did go seek medical care and underwent a second ray amputation has currently been using silver alginate dressings he does have a PICC line for daptomycin however this was not covered by insurance and is currently taken Bactrim DS.  The second ray was resected with an ulcer beneath the third metatarsal head.  Patient complains of maceration and bloody drainage.  Past Medical History:  Diagnosis Date  . ADHD, adult residual type    takes Ritalin daily  . Anxiety   . Arthritis   . Bipolar disorder (William Solis)    "a touch"  . Depression    takes Lexapro daily  . Hepatitis C    resolved with treatment 2019. released from hepatology  . Hypertension    takes Lisinopril,Metoprolol,and Amlodipine daily  . Insomnia    takes Melatonin nightly  . Muscle spasm    takes Flexeril daily  . Neuropathy involving both lower extremities    takes Gabapentin daily  . Pneumonia     Past Surgical History:  Procedure Laterality Date  . AMPUTATION Left 10/21/2013   Procedure: LEFT FOOT SECOND RAY AMPUTATION ;  Surgeon: William Minion, MD;  Location: [facility];  Service: Orthopedics;  Laterality: Left;  . BACK SURGERY  2006   Disc  . COLONOSCOPY    . PARATHYROIDECTOMY N/A 12/21/2013   Procedure: PARATHYROIDECTOMY;  Surgeon: William Ok, MD;  Location: [facility];  Service: General;  Laterality: N/A;  . SINUS EXPLORATION  2005/2007  . TEE WITHOUT CARDIOVERSION N/A 10/24/2013   Procedure: TRANSESOPHAGEAL ECHOCARDIOGRAM (TEE);  Surgeon: William Records, MD;  Location: [facility] ENDOSCOPY;  Service: Cardiovascular;  Laterality: N/A;    Family History  Problem Relation Age of Onset  . Hypertension Mother   . Hypertension Father   . Diabetes type II Neg Hx     Social History:  reports that he quit smoking about 27 years ago. His smoking use included cigarettes and cigars. He quit after 21.00 years of use. He has never used smokeless tobacco. He reports that he does not drink alcohol or use drugs.  Allergies: No Known Allergies  Medications Prior to Admission  Medication Sig Dispense Refill  . amLODipine (NORVASC) 10 MG tablet Take 1 tablet (10 mg total) by mouth daily. 90 tablet 3  . Ascorbic Acid (VITAMIN C) 1000 MG tablet Take 1,000 mg by mouth daily.    . carvedilol (COREG) 25 MG tablet Take 1 tablet (25 mg total) by mouth 2 (two) times daily with a meal. 180 tablet 3  . citalopram (CELEXA) 20 MG tablet Take 1 tablet (20 mg total) by mouth daily. 90 tablet 3  . cyclobenzaprine (FLEXERIL) 10 MG tablet Take 5 mg by mouth 3 (three) times daily as needed for muscle spasms.     . Glucosamine-Chondroit-Vit C-Mn (GLUCOSAMINE 1500 COMPLEX PO) Take by mouth.    . hydrochlorothiazide (HYDRODIURIL) 25 MG tablet Take 1 tablet (25 mg total) by mouth daily. 90 tablet 3  . lisinopril (PRINIVIL,ZESTRIL) 40 MG tablet Take 1 tablet (40 mg total) by mouth daily. 90 tablet 3  . Melatonin 5 MG TABS Take 0.5 tablets by mouth at bedtime.     . meloxicam (MOBIC) 15 MG tablet Take  15 mg by mouth daily. As needed for pain in thumbs    . Multiple Vitamins-Minerals (MULTIVITAMIN WITH MINERALS) tablet Take 1 tablet by mouth daily.    . Omega-3 Fatty Acids (FISH OIL PO) Take 1,000 mg by mouth daily.     . Oxycodone HCl 10 MG TABS Take 1 tablet (10 mg total) by mouth 3 (three) times daily. 90 tablet 0  . sulfamethoxazole-trimethoprim (BACTRIM DS,SEPTRA DS) 800-160 MG tablet Take 1 tablet by mouth 2 (two) times daily.    William Solis gabapentin (NEURONTIN) 600 MG tablet Take 600 mg by mouth at bedtime as needed (pain).       Results for orders placed or performed in visit on 04/09/18 (from the past 48 hour(s))  CBC w/Diff     Status: Abnormal   Collection Time: 04/09/18  9:11 AM   Result Value Ref Range   WBC 13.4 (H) 4.0 - 10.5 K/uL   RBC 4.76 4.22 - 5.81 Mil/uL   Hemoglobin 14.6 13.0 - 17.0 g/dL   HCT 43.0 39.0 - 52.0 %   MCV 90.4 78.0 - 100.0 fl   MCHC 33.9 30.0 - 36.0 g/dL   RDW 13.7 11.5 - 15.5 %   Platelets 329.0 150.0 - 400.0 K/uL   Neutrophils Relative % 73.1 43.0 - 77.0 %   Lymphocytes Relative 7.6 (L) 12.0 - 46.0 %   Monocytes Relative 13.4 (H) 3.0 - 12.0 %   Eosinophils Relative 4.7 0.0 - 5.0 %   Basophils Relative 1.2 0.0 - 3.0 %   Neutro Abs 9.8 (H) 1.4 - 7.7 K/uL   Lymphs Abs 1.0 0.7 - 4.0 K/uL   Monocytes Absolute 1.8 (H) 0.1 - 1.0 K/uL   Eosinophils Absolute 0.6 0.0 - 0.7 K/uL   Basophils Absolute 0.2 (H) 0.0 - 0.1 K/uL  CK (Creatine Kinase)     Status: None   Collection Time: 04/09/18  9:11 AM  Result Value Ref Range   Total CK 68 7 - 232 U/L  Sedimentation rate     Status: Abnormal   Collection Time: 04/09/18  9:11 AM  Result Value Ref Range   Sed Rate 61 (H) 0 - 20 mm/hr  Comp Met (CMET)     Status: Abnormal   Collection Time: 04/09/18  9:11 AM  Result Value Ref Range   Sodium 134 (L) 135 - 145 mEq/L   Potassium 4.7 3.5 - 5.1 mEq/L   Chloride 98 96 - 112 mEq/L   CO2 24 19 - 32 mEq/L   Glucose, Bld 90 70 - 99 mg/dL   BUN 29 (H) 6 - 23 mg/dL   Creatinine, Ser 1.98 (H) 0.40 - 1.50 mg/dL   Total Bilirubin 0.5 0.2 - 1.2 mg/dL   Alkaline Phosphatase 67 39 - 117 U/L   AST 26 0 - 37 U/L   ALT 30 0 - 53 U/L   Total Protein 6.9 6.0 - 8.3 g/dL   Albumin 3.7 3.5 - 5.2 g/dL   Calcium 8.9 8.4 - 10.5 mg/dL   GFR 34.04 (L) >60.00 mL/min  C-reactive protein     Status: None   Collection Time: 04/09/18  9:11 AM  Result Value Ref Range   CRP 2.8 0.5 - 20.0 mg/dL   Dg Chest 1 View  Result Date: 04/10/2018 CLINICAL DATA:  PICC line placement before surgery. EXAM: CHEST  1 VIEW COMPARISON:  None. FINDINGS: Right-sided PICC line has tip over the SVC. Lungs are adequately inflated and otherwise clear. Cardiomediastinal silhouette is within  normal. Remainder of the exam is unremarkable. IMPRESSION: No acute findings. Right-sided PICC line with tip over the SVC. Electronically Signed   By: William Solis M.D.   On: 04/10/2018 07:50   Xr Foot 2 Views Left  Result Date: 04/09/2018 2 view radiographs of the left foot shows a dislocation of the MTP joint third toe previous second ray amputation there is no air or gas in the soft tissue.   Review of Systems  All other systems reviewed and are negative.   Blood pressure 117/68, pulse 73, temperature 97.7 F (36.5 C), resp. rate (!) 21, SpO2 95 %. Physical Exam  Patient is alert, oriented, no adenopathy, well-dressed, normal affect, normal respiratory effort. Examination patient has a palpable dorsalis pedis pulse.  The Doppler was used and he has a strong triphasic posterior tibial pulse and a triphasic dorsalis pedis pulse.  Examination patient has a large necrotic ulcer over the second ray amputation the ulcer has exposed bone from the third metatarsal and third toe dorsally with necrotic tendon.  The ulcer beneath the third metatarsal head probes directly to the third metatarsal head as well as the fourth metatarsal head.  There is necrotic tissue that extends to the border of the great toe.  There is cellulitis extending into the midfoot.  Assessment/Plan 1. Left foot pain   2. Subacute osteomyelitis, left ankle and foot ([place])   3. Cutaneous abscess of left foot     Plan: Patient has a large necrotic ulcer over the forefoot with cellulitis involving the forefoot and midfoot.  Discussed treatment options and I feel the only safe option is to proceed with a transmetatarsal amputation.  Discussed that the infection extends more proximally we would have to consider additional surgery.  Discussed that with patient's cellulitis in the foot and necrotic ulcer that it is safest to proceed with surgery as soon as possible.  We will plan for surgery tomorrow morning.  Plan for overnight  observation with discharge to home with a wound VAC.  Risks and benefits were discussed including risk of the wound not healing.   William Minion, MD 04/10/2018, 10:30 AM

## 2018-04-10 NOTE — Anesthesia Postprocedure Evaluation (Signed)
Anesthesia Post Note  Patient: William Solis  Procedure(s) Performed: LEFT TRANSMETATARSAL AMPUTATION (Left Foot)     Patient location during evaluation: PACU Anesthesia Type: General Level of consciousness: awake and alert Pain management: pain level controlled Vital Signs Assessment: post-procedure vital signs reviewed and stable Respiratory status: spontaneous breathing, nonlabored ventilation and respiratory function stable Cardiovascular status: blood pressure returned to baseline and stable Postop Assessment: no apparent nausea or vomiting Anesthetic complications: no    Last Vitals:  Vitals:   04/10/18 1015 04/10/18 1036  BP: 117/68 130/70  Pulse: 73 72  Resp: (!) 21 18  Temp:  36.7 C  SpO2: 95% 96%    Last Pain:  Vitals:   04/10/18 1036  TempSrc: Oral  PainSc: 0-No pain                 Sherleen Pangborn,W. EDMOND

## 2018-04-10 NOTE — Plan of Care (Signed)
  Problem: Clinical Measurements: Goal: Postoperative complications will be avoided or minimized Outcome: Progressing   Problem: Pain Management: Goal: Pain level will decrease with appropriate interventions Outcome: Progressing   Problem: Skin Integrity: Goal: Demonstration of wound healing without infection will improve Outcome: Progressing

## 2018-04-10 NOTE — Transfer of Care (Signed)
Immediate Anesthesia Transfer of Care Note  Patient: William Solis  Procedure(s) Performed: LEFT TRANSMETATARSAL AMPUTATION (Left Foot)  Patient Location: PACU  Anesthesia Type:General  Level of Consciousness: awake and patient cooperative  Airway & Oxygen Therapy: Patient Spontanous Breathing  Post-op Assessment: Report given to RN and Post -op Vital signs reviewed and stable  Post vital signs: Reviewed and stable  Last Vitals:  Vitals Value Taken Time  BP    Temp    Pulse 80 04/10/2018  9:31 AM  Resp 23 04/10/2018  9:31 AM  SpO2 98 % 04/10/2018  9:31 AM  Vitals shown include unvalidated device data.  Last Pain:  Vitals:   04/10/18 0755  TempSrc:   PainSc: 3       Patients Stated Pain Goal: 3 (40/98/11 9147)  Complications: No apparent anesthesia complications

## 2018-04-10 NOTE — Progress Notes (Signed)
Orthopedic Tech Progress Note Patient Details:  William Solis [birth year] [id number]  Ortho Devices Type of Ortho Device: Prafo boot/shoe Ortho Device/Splint Interventions: Application   Post Interventions Patient Tolerated: Well Instructions Provided: Care of device   [name] 04/10/2018, 12:24 PM

## 2018-04-10 NOTE — Progress Notes (Signed)
Spoke with Henrine Screws RN re PICC use.  PICC placed from outside service with no evidence of PICC tip location at this facility. Recommend CXR to verify PICC tip placement in SVC prior to use.  If pt refuses CXR, recommend to start PIV.  Henrine Screws RN verbalizes understanding.

## 2018-04-10 NOTE — Progress Notes (Signed)
Pt belongings at bedside [id number]

## 2018-04-11 ENCOUNTER — Encounter (HOSPITAL_COMMUNITY): Payer: Self-pay | Admitting: Orthopedic Surgery

## 2018-04-11 DIAGNOSIS — I10 Essential (primary) hypertension: Secondary | ICD-10-CM | POA: Diagnosis not present

## 2018-04-11 DIAGNOSIS — Z79899 Other long term (current) drug therapy: Secondary | ICD-10-CM | POA: Diagnosis not present

## 2018-04-11 DIAGNOSIS — G4700 Insomnia, unspecified: Secondary | ICD-10-CM | POA: Diagnosis not present

## 2018-04-11 DIAGNOSIS — M86272 Subacute osteomyelitis, left ankle and foot: Secondary | ICD-10-CM | POA: Diagnosis not present

## 2018-04-11 DIAGNOSIS — F319 Bipolar disorder, unspecified: Secondary | ICD-10-CM | POA: Diagnosis not present

## 2018-04-11 DIAGNOSIS — F419 Anxiety disorder, unspecified: Secondary | ICD-10-CM | POA: Diagnosis not present

## 2018-04-11 DIAGNOSIS — M1990 Unspecified osteoarthritis, unspecified site: Secondary | ICD-10-CM | POA: Diagnosis not present

## 2018-04-11 DIAGNOSIS — G629 Polyneuropathy, unspecified: Secondary | ICD-10-CM | POA: Diagnosis not present

## 2018-04-11 DIAGNOSIS — L97529 Non-pressure chronic ulcer of other part of left foot with unspecified severity: Secondary | ICD-10-CM | POA: Diagnosis not present

## 2018-04-11 MED ORDER — ACETAMINOPHEN 325 MG PO TABS: 325.0000 mg | ORAL_TABLET | Freq: Four times a day (QID) | ORAL | 0 refills | Status: DC | PRN

## 2018-04-11 MED ORDER — ASPIRIN 325 MG PO TBEC: 325.0000 mg | DELAYED_RELEASE_TABLET | Freq: Every day | ORAL | 0 refills | Status: DC

## 2018-04-11 MED ORDER — OXYCODONE HCL 5 MG PO TABS: 5.0000 mg | ORAL_TABLET | ORAL | 0 refills | Status: DC | PRN

## 2018-04-11 MED ORDER — METHOCARBAMOL 500 MG PO TABS: 500.0000 mg | ORAL_TABLET | Freq: Four times a day (QID) | ORAL | 0 refills | Status: DC | PRN

## 2018-04-11 MED ORDER — GABAPENTIN 300 MG PO CAPS: 300.0000 mg | ORAL_CAPSULE | Freq: Three times a day (TID) | ORAL | 0 refills | Status: DC

## 2018-04-11 NOTE — Evaluation (Signed)
Physical Therapy Evaluation Patient Details Name: William Solis MRN: [id number] DOB: 05/04/1950 Today's Date: 04/11/2018   History of Present Illness  Pt is a 68 y.o. male s/p L transmetatarsal amputation. PMH consists of HTN, hep C and peripheral neuropathy.   Clinical Impression  PT eval complete. Pt demonstrated modified independence with bed mobility. Supervision provided for transfers and min guard assist ambulation 25 feet with RW. No LOB noted. Pt fatigues quickly. Pt able to maintain NWB LLE. Pt verbally educated on bumping up stairs on bottom to enter his house. Pt verbalizes understanding and voices no questions or concerns. He reports having RW at home as well as access to wheelchair if needed. Pt provided with LE HEP. Plan is for d/c home today. No further PT intervention indicated. PT signing off.    Follow Up Recommendations No PT follow up;Supervision/Assistance - 24 hour    Equipment Recommendations  None recommended by PT    Recommendations for Other Services       Precautions / Restrictions Precautions Precautions: Fall;Other (comment) Precaution Comments: wound vac L foot Required Braces or Orthoses: Other Brace Other Brace: post op shoe Restrictions LLE Weight Bearing: Non weight bearing      Mobility  Bed Mobility Overal bed mobility: Modified Independent                Transfers Overall transfer level: Needs assistance Equipment used: Rolling walker (2 wheeled) Transfers: Sit to/from [facility] Sit to Stand: Supervision Stand pivot transfers: Supervision       General transfer comment: supervision for safety, cues for hand placement  Ambulation/Gait Ambulation/Gait assistance: Min guard Gait Distance (Feet): 25 Feet Assistive device: Rolling walker (2 wheeled) Gait Pattern/deviations: Step-to pattern Gait velocity: decreased Gait velocity interpretation: <1.31 ft/sec, indicative of household ambulator General Gait  Details: min guard for safety, no LOB noted. Pt fatigues quickly. Pt able to maintain NWB LLE in post op shoe.   Stairs Stairs: (Pt verbally educated on need to bump up stairs on bottom to enter house. Pt reports finished basement has ground level entrance with carpeted stairs with L rail to enter main level of house. Pt verbalizes understanding and reports no questions/concerns. )          Wheelchair Mobility    Modified Rankin (Stroke Patients Only)       Balance Overall balance assessment: Needs assistance Sitting-balance support: No upper extremity supported;Feet supported Sitting balance-Leahy Scale: Good     Standing balance support: Bilateral upper extremity supported;During functional activity Standing balance-Leahy Scale: Poor Standing balance comment: reliant on RW                             Pertinent Vitals/Pain Pain Assessment: No/denies pain    Home Living Family/patient expects to be discharged to:: Private residence Living Arrangements: Spouse/significant other Available Help at Discharge: Family;Available 24 hours/day Type of Home: House Home Access: Stairs to enter Entrance Stairs-Rails: Left Entrance Stairs-Number of Steps: 12 Home Layout: Two level;Able to live on main level with bedroom/bathroom Home Equipment: Kasandra Knudsen - single point;Walker - 2 wheels;Shower seat Additional Comments: Pt states he has access to a wheelchair, if he decides he needs it.     Prior Function Level of Independence: Independent               Hand Dominance   Dominant Hand: Right    Extremity/Trunk Assessment   Upper Extremity Assessment Upper Extremity Assessment: Overall  WFL for tasks assessed    Lower Extremity Assessment Lower Extremity Assessment: LLE deficits/detail LLE Deficits / Details: s/p transmet amp LLE Sensation: history of peripheral neuropathy    Cervical / Trunk Assessment Cervical / Trunk Assessment: Normal  Communication    Communication: No difficulties  Cognition Arousal/Alertness: Awake/alert Behavior During Therapy: WFL for tasks assessed/performed Overall Cognitive Status: Within Functional Limits for tasks assessed                                        General Comments      Exercises General Exercises - Lower Extremity Ankle Circles/Pumps: AROM;Both;5 reps;Supine Quad Sets: AROM;5 reps;Both;Supine Long Arc Quad: AROM;Left;5 reps;Seated Heel Slides: AROM;Left;5 reps;Supine Hip ABduction/ADduction: AROM;Both;5 reps;Supine Hip Flexion/Marching: AROM;Left;Seated   Assessment/Plan    PT Assessment Patent does not need any further PT services  PT Problem List         PT Treatment Interventions      PT Goals (Current goals can be found in the Care Plan section)  Acute Rehab PT Goals Patient Stated Goal: home today PT Goal Formulation: All assessment and education complete, DC therapy    Frequency     Barriers to discharge        Co-evaluation               AM-PAC PT "6 Clicks" Mobility  Outcome Measure Help needed turning from your back to your side while in a flat bed without using bedrails?: None Help needed moving from lying on your back to sitting on the side of a flat bed without using bedrails?: None Help needed moving to and from a bed to a chair (including a wheelchair)?: None Help needed standing up from a chair using your arms (e.g., wheelchair or bedside chair)?: None Help needed to walk in hospital room?: A Little Help needed climbing 3-5 steps with a railing? : A Lot 6 Click Score: 21    End of Session Equipment Utilized During Treatment: Gait belt Activity Tolerance: Patient tolerated treatment well Patient left: in chair;with call bell/phone within reach Nurse Communication: Mobility status PT Visit Diagnosis: Unsteadiness on feet (R26.81);Difficulty in walking, not elsewhere classified (R26.2)    Time: 0926-1000 PT Time Calculation (min)  (ACUTE ONLY): 34 min   Charges:   PT Evaluation $PT Eval Moderate Complexity: 1 Mod PT Treatments $Gait Training: 8-22 mins        Lorrin Goodell, PT  Office # [phone] Pager [phone]   Lorriane Shire 04/11/2018, 10:31 AM

## 2018-04-11 NOTE — Plan of Care (Signed)
  Problem: Pain Management: Goal: Pain level will decrease with appropriate interventions Outcome: Progressing   Problem: Skin Integrity: Goal: Demonstration of wound healing without infection will improve Outcome: Progressing

## 2018-04-11 NOTE — TOC Transition Note (Signed)
Transition of Care [facility]) - CM/SW Discharge Note   Patient Details  Name: William Solis MRN: [id number] Date of Birth: September 08, 1950  Transition of Care [facility]) CM/SW Contact:  Claudie Leach, RN Phone Number: 04/11/2018, 10:28 AM   Clinical Narrative:   Pt presented for transmetatarsal amputation.  Pt from home with wife. She is a Pharmacist, hospital and will be home with him during the next few weeks.  Pt states he routinely uses RW and has 3n1 at home.  Pt has access to a wheelchair to borrow if needed.     Final next level of care: Home/Self Care Barriers to Discharge: No Barriers Identified      Discharge Plan and Services  Pt will use own DME.  No HH anticipated.  Pt has wife to provide assistance as needed.

## 2018-04-11 NOTE — Progress Notes (Signed)
PICC line removed per order. Line was 41cm long. Patient states the nurse told him when she inserted it, it was 41 cm, however I could not confirm length under care everywhere. Vaseline gauze/gauze dressing applied. Patient aware he is on bedrest until 1300 and to keep dressing dry and intact for 24 hours.

## 2018-04-11 NOTE — Care Management Obs Status (Signed)
[facility] NOTIFICATION   Patient Details  Name: William Solis MRN: [id number] Date of Birth: [birth year]   Medicare Observation Status Notification Given:  Yes    Claudie Leach, RN 04/11/2018, 10:26 AM

## 2018-04-11 NOTE — Progress Notes (Signed)
Subjective: Pt stable - pain ok   Objective: Vital signs in last 24 hours: Temp:  [97.7 F (36.5 C)-98.6 F (37 C)] 98.3 F (36.8 C) (03/22 0538) Pulse Rate:  [72-94] 86 (03/22 0538) Resp:  [16-21] 16 (03/22 0538) BP: (99-139)/(68-82) 129/71 (03/22 0538) SpO2:  [92 %-98 %] 95 % (03/22 0538)  Intake/Output from previous day: 03/21 0701 - 03/22 0700 In: 1468.8 [P.O.:720; I.V.:548.8; IV Piggyback:200] Out: 200 [Blood:200] Intake/Output this shift: No intake/output data recorded.  Exam:  Dorsiflexion/Plantar flexion intact  Labs: Recent Labs    04/09/18 0911  HGB 14.6   Recent Labs    04/09/18 0911  WBC 13.4*  RBC 4.76  HCT 43.0  PLT 329.0   Recent Labs    04/09/18 0911  NA 134*  K 4.7  CL 98  CO2 24  BUN 29*  CREATININE 1.98*  GLUCOSE 90  CALCIUM 8.9   No results for input(s): LABPT, INR in the last 72 hours.  Assessment/Plan: Plan dc today if ok with PT   G Alphonzo Severance 04/11/2018, 8:42 AM

## 2018-04-13 ENCOUNTER — Telehealth (INDEPENDENT_AMBULATORY_CARE_PROVIDER_SITE_OTHER): Payer: Self-pay | Admitting: Orthopedic Surgery

## 2018-04-13 NOTE — Telephone Encounter (Signed)
I have reviewed events of last several weeks; Pt s/p transmetatarsal amputation by Dr. Sharol Given who is now managing his care.   Dr. Sharol Given, Thank you for your assistance.  I am following up on this phone message from Infectious Disease. I was out of the office last week.  Since he has had surgery, I suspect we no longer need to have ID actively on board, or do we?? You saw his + MRSA wound cultures from [place].   Please let me know if I can assist in anyway.  As well, he will need a follow up visit here to recheck his renal function.   Thanks! Dr. Jonni Sanger

## 2018-04-13 NOTE — Telephone Encounter (Signed)
Thank you for the note, I'll see him this week and should not need further antibiotics. Margins were clear.

## 2018-04-13 NOTE — Telephone Encounter (Signed)
I called pt to advise that we would remove this vac at his appt on Thursday and that he would not need to wear it any longer. Pt has an appt on Thursday will call sooner with any complications from the vac and move appt up if needed.

## 2018-04-13 NOTE — Telephone Encounter (Signed)
Patient called stating that he has a Provena Wound Vac and wanted to know if we have the removable insert for them.  He has an appointment with Dr. Sharol Given on Thursday and just wanted to make sure.  CB#[phone].  Thank you.

## 2018-04-14 ENCOUNTER — Other Ambulatory Visit: Payer: Self-pay

## 2018-04-14 ENCOUNTER — Ambulatory Visit (INDEPENDENT_AMBULATORY_CARE_PROVIDER_SITE_OTHER): Payer: Medicare HMO | Admitting: Family

## 2018-04-14 ENCOUNTER — Encounter (INDEPENDENT_AMBULATORY_CARE_PROVIDER_SITE_OTHER): Payer: Self-pay | Admitting: Family

## 2018-04-14 VITALS — Ht 72.0 in | Wt 215.0 lb

## 2018-04-14 DIAGNOSIS — Z89432 Acquired absence of left foot: Secondary | ICD-10-CM

## 2018-04-14 DIAGNOSIS — L02612 Cutaneous abscess of left foot: Secondary | ICD-10-CM

## 2018-04-14 NOTE — Progress Notes (Signed)
Post-Op Visit Note   Patient: William Solis           Date of Birth: 1950-04-05           MRN: [id number] Visit Date: 04/14/2018 PCP: Leamon Arnt, MD  Chief Complaint:  Chief Complaint  Patient presents with  . Left Foot - Routine Post Op    04/10/18 left transmet amputation     HPI:  HPI The patient is a 68 year old gentleman who presents today status post transmetatarsal amputation on 04/10/18. Wound vac alarmed. Wife accompanies. Wondering when to stop the Bactrim. Having some diarrhea. Wondering what probiotic to take.   Did return from [place] via plane last week.   Ortho Exam On examination the incision is well approximated with sutures. No erythema. No odor. No purulence no sign of infection.   Visit Diagnoses:  1. Cutaneous abscess of left foot   2. S/P transmetatarsal amputation of foot, left ([place])     Plan: begin daily dial soap cleansing. Apply dry dressing. Elevate. TDWTB through heel for transfers only.  Follow-Up Instructions: Return in about 2 weeks (around 04/28/2018).   Imaging: No results found.  Orders:  No orders of the defined types were placed in this encounter.  No orders of the defined types were placed in this encounter.    PMFS History: Patient Active Problem List   Diagnosis Date Noted  . S/P transmetatarsal amputation of foot, left ([place]) 04/10/2018  . Osteomyelitis of left foot ([place]) 04/03/2018  . Postlaminectomy syndrome 04/01/2017  . Lumbar stenosis 03/09/2017  . Fibromyalgia 09/15/2016  . Risk for falls 06/25/2016  . OSA on CPAP 11/07/2015  . Bilateral edema of lower extremity 10/12/2015  . S/P removal of parathyroid gland ([place]) 12/21/2013  . History of hyperparathyroidism 11/22/2013  . History of osteomyelitis 10/25/2013  . Chronic low back pain 05/12/2012  . Narcotic dependence ([place]) 05/12/2012  . History of hepatitis C 04/19/2010  . Hypertension 04/19/2010  . Dry eye syndrome 11/20/2008  . MDD (major depressive  disorder) 11/20/2008  . ADD (attention deficit disorder) 12/01/2006   Past Medical History:  Diagnosis Date  . ADHD, adult residual type    takes Ritalin daily  . Anxiety   . Arthritis   . Bipolar disorder ([place])    "a touch"  . Cutaneous abscess of left foot 04/09/2018  . Depression    takes Lexapro daily  . Hepatitis C    resolved with treatment 2019. released from hepatology  . Hypertension    takes Lisinopril,Metoprolol,and Amlodipine daily  . Insomnia    takes Melatonin nightly  . Left foot pain 04/09/2018  . Muscle spasm    takes Flexeril daily  . Neuropathy involving both lower extremities    takes Gabapentin daily  . Open wound of left foot 04/03/2018  . Pneumonia   . Septic arthritis of left foot ([place]) 04/03/2018    Family History  Problem Relation Age of Onset  . Hypertension Mother   . Hypertension Father   . Diabetes type II Neg Hx     Past Surgical History:  Procedure Laterality Date  . AMPUTATION Left 10/21/2013   Procedure: LEFT FOOT SECOND RAY AMPUTATION ;  Surgeon: [name], MD;  Location: [facility];  Service: Orthopedics;  Laterality: Left;  . AMPUTATION Left 04/10/2018   Procedure: LEFT TRANSMETATARSAL AMPUTATION;  Surgeon: [name], MD;  Location: [place];  Service: Orthopedics;  Laterality: Left;  . BACK SURGERY  2006  Disc  . COLONOSCOPY    . PARATHYROIDECTOMY N/A 12/21/2013   Procedure: PARATHYROIDECTOMY;  Surgeon: Ralene Ok, MD;  Location: [facility];  Service: General;  Laterality: N/A;  . SINUS EXPLORATION  2005/2007  . TEE WITHOUT CARDIOVERSION N/A 10/24/2013   Procedure: TRANSESOPHAGEAL ECHOCARDIOGRAM (TEE);  Surgeon: Fay Records, MD;  Location: [facility] ENDOSCOPY;  Service: Cardiovascular;  Laterality: N/A;   Social History   Occupational History  . Occupation: retired     Fish farm manager: [employer]  Tobacco Use  . Smoking status: Former Smoker    Years: 21.00    Types: Cigarettes, Cigars    Last attempt to quit: 10/20/1990     Years since quitting: 27.5  . Smokeless tobacco: Never Used  Substance and Sexual Activity  . Alcohol use: No    Alcohol/week: 0.0 standard drinks    Frequency: Never  . Drug use: No  . Sexual activity: Never

## 2018-04-14 NOTE — Discharge Summary (Signed)
Discharge Diagnoses:  Active Problems:   S/P transmetatarsal amputation of foot, left (H[facility])   Surgeries: Procedure(s): LEFT TRANSMETATARSAL AMPUTATION on 04/10/2018    Consultants:   Discharged Condition: Improved  Hospital Course: William Solis is an 68 y.o. male who was admitted 04/10/2018 with a chief complaint of abscess osteomyelitis left foot, with a final diagnosis of Abscess Osteomyelitis Left Foot.  Patient was brought to the operating room on 04/10/2018 and underwent Procedure(s): LEFT TRANSMETATARSAL AMPUTATION.    Patient was given perioperative antibiotics:  Anti-infectives (From admission, onward)   Start     Dose/Rate Route Frequency Ordered Stop   04/10/18 1045  ceFAZolin (ANCEF) IVPB 2g/100 mL premix     2 g 200 mL/hr over 30 Minutes Intravenous Every 6 hours 04/10/18 1041 04/11/18 0850   04/10/18 0645  ceFAZolin (ANCEF) IVPB 2g/100 mL premix     2 g 200 mL/hr over 30 Minutes Intravenous On call to O.R. 04/10/18 7062 04/10/18 0847    .  Patient was given sequential compression devices, early ambulation, and aspirin for DVT prophylaxis.  Recent vital signs: No data found..  Recent laboratory studies: No results found.  Discharge Medications:   Allergies as of 04/11/2018   No Known Allergies     Medication List    STOP taking these medications   cyclobenzaprine 10 MG tablet Commonly known as:  FLEXERIL   ibuprofen 200 MG tablet Commonly known as:  ADVIL,MOTRIN     TAKE these medications   acetaminophen 325 MG tablet Commonly known as:  TYLENOL Take 1-2 tablets (325-650 mg total) by mouth every 6 (six) hours as needed for mild pain (pain score 1-3 or temp > 100.5).   albuterol 108 (90 Base) MCG/ACT inhaler Commonly known as:  PROVENTIL HFA;VENTOLIN HFA Inhale 2 puffs into the lungs every 6 (six) hours as needed for wheezing or shortness of breath.   amLODipine 10 MG tablet Commonly known as:  NORVASC Take 1 tablet (10 mg total) by mouth  daily.   ARNICA EX Apply 1 application topically 3 (three) times daily as needed (arthritis pain).   aspirin 325 MG EC tablet Take 1 tablet (325 mg total) by mouth daily.   carvedilol 12.5 MG tablet Commonly known as:  COREG Take 12.5 mg by mouth 2 (two) times daily. What changed:  Another medication with the same name was removed. Continue taking this medication, and follow the directions you see here.   cholecalciferol 25 MCG (1000 UT) tablet Commonly known as:  VITAMIN D3 Take 1,000 Units by mouth daily.   citalopram 20 MG tablet Commonly known as:  CELEXA Take 1 tablet (20 mg total) by mouth daily.   Fish Oil 1200 MG Caps Take 1,200 mg by mouth daily.   gabapentin 300 MG capsule Commonly known as:  NEURONTIN Take 1 capsule (300 mg total) by mouth 3 (three) times daily.   Glucosamine HCl 1500 MG Tabs Take 1,500 mg by mouth daily.   hydrochlorothiazide 25 MG tablet Commonly known as:  HYDRODIURIL Take 1 tablet (25 mg total) by mouth daily.   lidocaine 5 % Commonly known as:  LIDODERM Place 1 patch onto the skin daily as needed (pain). Remove & Discard patch within 12 hours or as directed by MD   lisinopril 40 MG tablet Commonly known as:  PRINIVIL,ZESTRIL Take 1 tablet (40 mg total) by mouth daily.   Melatonin 3 MG Tabs Take 3 mg by mouth at bedtime.   meloxicam 15 MG tablet Commonly known  as:  MOBIC Take 15 mg by mouth daily as needed (arthritis pain (hands)).   methocarbamol 500 MG tablet Commonly known as:  ROBAXIN Take 1 tablet (500 mg total) by mouth every 6 (six) hours as needed for muscle spasms.   multivitamin with minerals tablet Take 1 tablet by mouth daily.   oxyCODONE 5 MG immediate release tablet Commonly known as:  Oxy IR/ROXICODONE Take 1-2 tablets (5-10 mg total) by mouth every 4 (four) hours as needed for moderate pain (pain score 4-6). What changed:    medication strength  how much to take  when to take this  reasons to take  this   sulfamethoxazole-trimethoprim 800-160 MG tablet Commonly known as:  BACTRIM DS,SEPTRA DS Take 1 tablet by mouth 2 (two) times daily.   VISINE OP Place 1 drop into both eyes 3 (three) times daily as needed (dry eyes).   VITAMIN B-12 PO Take 1 tablet by mouth every 3 (three) days.   vitamin C 1000 MG tablet Take 1,500 mg by mouth daily.       Diagnostic Studies: Dg Chest 1 View  Result Date: 04/10/2018 CLINICAL DATA:  PICC line placement before surgery. EXAM: CHEST  1 VIEW COMPARISON:  None. FINDINGS: Right-sided PICC line has tip over the SVC. Lungs are adequately inflated and otherwise clear. Cardiomediastinal silhouette is within normal. Remainder of the exam is unremarkable. IMPRESSION: No acute findings. Right-sided PICC line with tip over the SVC. Electronically Signed   By: Marin Olp M.D.   On: 04/10/2018 07:50   Xr Foot 2 Views Left  Result Date: 04/09/2018 2 view radiographs of the left foot shows a dislocation of the MTP joint third toe previous second ray amputation there is no air or gas in the soft tissue.   Patient benefited maximally from their hospital stay and there were no complications.     Disposition:  Discharge Instructions    Call MD / Call 911   Complete by:  As directed    If you experience chest pain or shortness of breath, CALL 911 and be transported to the hospital emergency room.  If you develope a fever above 101 F, pus (white drainage) or increased drainage or redness at the wound, or calf pain, call your surgeon's office.   Constipation Prevention   Complete by:  As directed    Drink plenty of fluids.  Prune juice may be helpful.  You may use a stool softener, such as Colace (over the counter) 100 mg twice a day.  Use MiraLax (over the counter) for constipation as needed.   Diet - low sodium heart healthy   Complete by:  As directed    Increase activity slowly as tolerated   Complete by:  As directed      Follow-up Information     William Minion, MD In 1 week.   Specialty:  Orthopedic Surgery Contact information: [address] [place] [address] [phone]            Signed: Newt Solis 04/14/2018, 2:30 PM

## 2018-04-15 ENCOUNTER — Encounter (INDEPENDENT_AMBULATORY_CARE_PROVIDER_SITE_OTHER): Payer: Self-pay

## 2018-04-15 ENCOUNTER — Ambulatory Visit (INDEPENDENT_AMBULATORY_CARE_PROVIDER_SITE_OTHER): Payer: Medicare HMO | Admitting: Orthopedic Surgery

## 2018-04-28 ENCOUNTER — Other Ambulatory Visit (INDEPENDENT_AMBULATORY_CARE_PROVIDER_SITE_OTHER): Payer: Self-pay | Admitting: Orthopedic Surgery

## 2018-04-28 ENCOUNTER — Telehealth (INDEPENDENT_AMBULATORY_CARE_PROVIDER_SITE_OTHER): Payer: Self-pay

## 2018-04-28 NOTE — Telephone Encounter (Signed)
Ok to rf? 

## 2018-04-28 NOTE — Telephone Encounter (Signed)
y

## 2018-04-28 NOTE — Telephone Encounter (Signed)
I called and sw pt to ask prescreen COVID-19 questions. He answered NO to all EXCEPT for he did travel to [place] the first week of March and that he does have ABD pain and diarrhea but this is due to taking ABX. Has an appt tomorrow at 10:45

## 2018-04-29 ENCOUNTER — Other Ambulatory Visit: Payer: Self-pay

## 2018-04-29 ENCOUNTER — Encounter (INDEPENDENT_AMBULATORY_CARE_PROVIDER_SITE_OTHER): Payer: Self-pay | Admitting: Orthopedic Surgery

## 2018-04-29 ENCOUNTER — Ambulatory Visit (INDEPENDENT_AMBULATORY_CARE_PROVIDER_SITE_OTHER): Payer: Medicare HMO | Admitting: Physician Assistant

## 2018-04-29 VITALS — Ht 72.0 in | Wt 215.0 lb

## 2018-04-29 DIAGNOSIS — Z89432 Acquired absence of left foot: Secondary | ICD-10-CM

## 2018-04-29 MED ORDER — DOXYCYCLINE HYCLATE 100 MG PO CAPS: 100.0000 mg | ORAL_CAPSULE | Freq: Two times a day (BID) | ORAL | 0 refills | Status: DC

## 2018-04-29 NOTE — Progress Notes (Signed)
Office Visit Note   Patient: William Solis           Date of Birth: [birth year]           MRN: [id number] Visit Date: 04/29/2018              Requested by: Leamon Arnt, [address] [place] [address] [place], [place] [address] PCP: Leamon Arnt, MD  Chief Complaint  Patient presents with  . Left Foot - Routine Post Op    04/10/2018 left transmet amputation       HPI: The patient is a 68 year old gentleman who is seen for postoperative follow-up following a left transmetatarsal amputation on 04/10/2018.  He has been weightbearing as tolerated with a walker in the postop shoe.  He is on Bactrim DS.  He has been doing a dry dressing change daily.  He does report severe nausea with the Bactrim and he has had to cut back to just taking 1 Bactrim DS daily.  Will look at switching him to another antibiotic.  He comes in with numerous questions today.  Assessment & Plan: Visit Diagnoses:  1. S/P transmetatarsal amputation of foot, left (HCC)     Plan: Switch antibiotic to doxycycline 100 mg p.o. twice daily.  Patient cautioned that this may also potentially cause nausea.  Discussed that he should continue washing the foot with Dial soap and water daily and applying a dry dressing.  Discussed that it would be normal for the patient to continue to have some minimal oozing, bloody drainage from the wound but that it is particularly important for the patient to maintain nonweightbearing over the amputation site and to elevate as much as possible.  We also discussed Achilles tendon stretching and working on ankle range of motion.  We discussed that no further blood work is indicated for right now.  We will leave the sutures in for nail and remove them as appropriate over the next couple of weeks.  We discussed that eventually hopefully he will be able to drive his manual transmission car as this is a goal of his. He will follow-up in 1 week.  Follow-Up Instructions: Return in about 1 week (around  05/06/2018).   Ortho Exam  Patient is alert, oriented, no adenopathy, well-dressed, normal affect, normal respiratory effort. The left transmetatarsal amputation site has some minimal pink drainage on the dressings today.  He has some moderate edema and very mild erythema of the distal incision line.  Sutures are intact but under tension.  We discussed elevating and nonweightbearing.  We will continue his antibiotic but switch to doxycycline.  He has good pedal pulses..  Imaging: No results found. No images are attached to the encounter.  Labs: Lab Results  Component Value Date   ESRSEDRATE 61 (H) 04/09/2018   ESRSEDRATE 30 (H) 10/19/2013   CRP 2.8 04/09/2018   REPTSTATUS 10/28/2013 FINAL 10/22/2013   GRAMSTAIN  10/19/2013    ABUNDANT WBC PRESENT,BOTH PMN AND MONONUCLEAR NO SQUAMOUS EPITHELIAL CELLS SEEN RARE GRAM POSITIVE COCCI IN PAIRS IN CLUSTERS Performed at [facility]  10/22/2013    NO GROWTH 5 DAYS Performed at [facility] 10/19/2013     Lab Results  Component Value Date   ALBUMIN 3.7 04/09/2018   ALBUMIN 4.2 06/02/2017   ALBUMIN 3.3 (L) 02/16/2015    Body mass index is 29.16 kg/m.  Orders:  No orders of the defined types were placed in this  encounter.  Meds ordered this encounter  Medications  . doxycycline (VIBRAMYCIN) 100 MG capsule    Sig: Take 1 capsule (100 mg total) by mouth 2 (two) times daily.    Dispense:  28 capsule    Refill:  0     Procedures: No procedures performed  Clinical Data: No additional findings.  ROS:  All other systems negative, except as noted in the HPI. Review of Systems  Objective: Vital Signs: Ht 6' (1.829 m)   Wt 215 lb (97.5 kg)   BMI 29.16 kg/m   Specialty Comments:  No specialty comments available.  PMFS History: Patient Active Problem List   Diagnosis Date Noted  . S/P transmetatarsal amputation of foot, left ([place]) 04/10/2018  . Osteomyelitis of  left foot ([place]) 04/03/2018  . Postlaminectomy syndrome 04/01/2017  . Lumbar stenosis 03/09/2017  . Fibromyalgia 09/15/2016  . Risk for falls 06/25/2016  . OSA on CPAP 11/07/2015  . Bilateral edema of lower extremity 10/12/2015  . S/P removal of parathyroid gland ([place]) 12/21/2013  . History of hyperparathyroidism 11/22/2013  . History of osteomyelitis 10/25/2013  . Chronic low back pain 05/12/2012  . Narcotic dependence ([place]) 05/12/2012  . History of hepatitis C 04/19/2010  . Hypertension 04/19/2010  . Dry eye syndrome 11/20/2008  . MDD (major depressive disorder) 11/20/2008  . ADD (attention deficit disorder) 12/01/2006   Past Medical History:  Diagnosis Date  . ADHD, adult residual type    takes Ritalin daily  . Anxiety   . Arthritis   . Bipolar disorder ([place])    "a touch"  . Cutaneous abscess of left foot 04/09/2018  . Depression    takes Lexapro daily  . Hepatitis C    resolved with treatment 2019. released from hepatology  . Hypertension    takes Lisinopril,Metoprolol,and Amlodipine daily  . Insomnia    takes Melatonin nightly  . Left foot pain 04/09/2018  . Muscle spasm    takes Flexeril daily  . Neuropathy involving both lower extremities    takes Gabapentin daily  . Open wound of left foot 04/03/2018  . Pneumonia   . Septic arthritis of left foot ([place]) 04/03/2018    Family History  Problem Relation Age of Onset  . Hypertension Mother   . Hypertension Father   . Diabetes type II Neg Hx     Past Surgical History:  Procedure Laterality Date  . AMPUTATION Left 10/21/2013   Procedure: LEFT FOOT SECOND RAY AMPUTATION ;  Surgeon: [name], MD;  Location: [facility];  Service: Orthopedics;  Laterality: Left;  . AMPUTATION Left 04/10/2018   Procedure: LEFT TRANSMETATARSAL AMPUTATION;  Surgeon: [name], MD;  Location: [facility];  Service: Orthopedics;  Laterality: Left;  . BACK SURGERY  2006   Disc  . COLONOSCOPY    . PARATHYROIDECTOMY N/A 12/21/2013   Procedure:  PARATHYROIDECTOMY;  Surgeon: Ralene Ok, MD;  Location: [facility];  Service: General;  Laterality: N/A;  . SINUS EXPLORATION  2005/2007  . TEE WITHOUT CARDIOVERSION N/A 10/24/2013   Procedure: TRANSESOPHAGEAL ECHOCARDIOGRAM (TEE);  Surgeon: Fay Records, MD;  Location: [facility] ENDOSCOPY;  Service: Cardiovascular;  Laterality: N/A;   Social History   Occupational History  . Occupation: retired     Fish farm manager: [employer]  Tobacco Use  . Smoking status: Former Smoker    Years: 21.00    Types: Cigarettes, Cigars    Last attempt to quit: 10/20/1990    Years since quitting: 27.5  .  Smokeless tobacco: Never Used  Substance and Sexual Activity  . Alcohol use: No    Alcohol/week: 0.0 standard drinks    Frequency: Never  . Drug use: No  . Sexual activity: Never

## 2018-05-02 ENCOUNTER — Encounter (INDEPENDENT_AMBULATORY_CARE_PROVIDER_SITE_OTHER): Payer: Self-pay | Admitting: Physician Assistant

## 2018-05-04 ENCOUNTER — Other Ambulatory Visit (INDEPENDENT_AMBULATORY_CARE_PROVIDER_SITE_OTHER): Payer: Self-pay | Admitting: Orthopedic Surgery

## 2018-05-04 NOTE — Telephone Encounter (Signed)
Ok to rf? 

## 2018-05-05 NOTE — Telephone Encounter (Signed)
Ok to rf? 

## 2018-05-05 NOTE — Telephone Encounter (Signed)
Not my patient

## 2018-05-05 NOTE — Telephone Encounter (Signed)
He does not need aspirin for his surgery, if he was on aspirin before surgery OK to continue

## 2018-05-06 ENCOUNTER — Other Ambulatory Visit: Payer: Self-pay

## 2018-05-06 ENCOUNTER — Ambulatory Visit (INDEPENDENT_AMBULATORY_CARE_PROVIDER_SITE_OTHER): Payer: Medicare HMO | Admitting: Orthopedic Surgery

## 2018-05-06 ENCOUNTER — Encounter (INDEPENDENT_AMBULATORY_CARE_PROVIDER_SITE_OTHER): Payer: Self-pay | Admitting: Orthopedic Surgery

## 2018-05-06 VITALS — Ht 72.0 in | Wt 215.0 lb

## 2018-05-06 DIAGNOSIS — Z89432 Acquired absence of left foot: Secondary | ICD-10-CM

## 2018-05-06 NOTE — Progress Notes (Signed)
Office Visit Note   Patient: William Solis           Date of Birth: 23-Nov-1950           MRN: [id number] Visit Date: 05/06/2018              Requested by: Leamon Arnt, [address] [place], [place] [address] PCP: Leamon Arnt, MD  Chief Complaint  Patient presents with  . Left Foot - Routine Post Op    04/10/2018 left foot transmet amputation       HPI: Patient is a 68 year old gentleman who presents 4 weeks status post left transmetatarsal amputation.  Patient is currently on doxycycline dry dressing changes he states he has a little bit of swelling he is weightbearing with a walker with a postoperative shoe.  Assessment & Plan: Visit Diagnoses:  1. S/P transmetatarsal amputation of foot, left (HCC)     Plan: The sutures are harvested patient is given a prescription for [employer] for extra-depth shoe custom orthotic spacer carbon plate and a double upright brace.  Follow-Up Instructions: Return in about 4 weeks (around 06/03/2018).   Ortho Exam  Patient is alert, oriented, no adenopathy, well-dressed, normal affect, normal respiratory effort. Examination patient's foot is plantigrade there is complete healing of the wound.  We will harvest the sutures today.  Patient has about 10 degrees of dorsiflexion past neutral.  Imaging: No results found. No images are attached to the encounter.  Labs: Lab Results  Component Value Date   ESRSEDRATE 61 (H) 04/09/2018   ESRSEDRATE 30 (H) 10/19/2013   CRP 2.8 04/09/2018   REPTSTATUS 10/28/2013 FINAL 10/22/2013   GRAMSTAIN  10/19/2013    ABUNDANT WBC PRESENT,BOTH PMN AND MONONUCLEAR NO SQUAMOUS EPITHELIAL CELLS SEEN RARE GRAM POSITIVE COCCI IN PAIRS IN CLUSTERS Performed at [facility]  10/22/2013    NO GROWTH 5 DAYS Performed at [facility] 10/19/2013     Lab Results  Component Value Date   ALBUMIN 3.7 04/09/2018   ALBUMIN 4.2 06/02/2017   ALBUMIN  3.3 (L) 02/16/2015    Body mass index is 29.16 kg/m.  Orders:  No orders of the defined types were placed in this encounter.  No orders of the defined types were placed in this encounter.    Procedures: No procedures performed  Clinical Data: No additional findings.  ROS:  All other systems negative, except as noted in the HPI. Review of Systems  Objective: Vital Signs: Ht 6' (1.829 m)   Wt 215 lb (97.5 kg)   BMI 29.16 kg/m   Specialty Comments:  No specialty comments available.  PMFS History: Patient Active Problem List   Diagnosis Date Noted  . S/P transmetatarsal amputation of foot, left ([place]) 04/10/2018  . Osteomyelitis of left foot ([place]) 04/03/2018  . Postlaminectomy syndrome 04/01/2017  . Lumbar stenosis 03/09/2017  . Fibromyalgia 09/15/2016  . Risk for falls 06/25/2016  . OSA on CPAP 11/07/2015  . Bilateral edema of lower extremity 10/12/2015  . S/P removal of parathyroid gland ([place]) 12/21/2013  . History of hyperparathyroidism 11/22/2013  . History of osteomyelitis 10/25/2013  . Chronic low back pain 05/12/2012  . Narcotic dependence ([place]) 05/12/2012  . History of hepatitis C 04/19/2010  . Hypertension 04/19/2010  . Dry eye syndrome 11/20/2008  . MDD (major depressive disorder) 11/20/2008  . ADD (attention deficit disorder) 12/01/2006   Past Medical History:  Diagnosis Date  . ADHD,  adult residual type    takes Ritalin daily  . Anxiety   . Arthritis   . Bipolar disorder ([place])    "a touch"  . Cutaneous abscess of left foot 04/09/2018  . Depression    takes Lexapro daily  . Hepatitis C    resolved with treatment 2019. released from hepatology  . Hypertension    takes Lisinopril,Metoprolol,and Amlodipine daily  . Insomnia    takes Melatonin nightly  . Left foot pain 04/09/2018  . Muscle spasm    takes Flexeril daily  . Neuropathy involving both lower extremities    takes Gabapentin daily  . Open wound of left foot 04/03/2018  . Pneumonia    . Septic arthritis of left foot ([place]) 04/03/2018    Family History  Problem Relation Age of Onset  . Hypertension Mother   . Hypertension Father   . Diabetes type II Neg Hx     Past Surgical History:  Procedure Laterality Date  . AMPUTATION Left 10/21/2013   Procedure: LEFT FOOT SECOND RAY AMPUTATION ;  Surgeon: [name], MD;  Location: [facility];  Service: Orthopedics;  Laterality: Left;  . AMPUTATION Left 04/10/2018   Procedure: LEFT TRANSMETATARSAL AMPUTATION;  Surgeon: [name], MD;  Location: [facility];  Service: Orthopedics;  Laterality: Left;  . BACK SURGERY  2006   Disc  . COLONOSCOPY    . PARATHYROIDECTOMY N/A 12/21/2013   Procedure: PARATHYROIDECTOMY;  Surgeon: Ralene Ok, MD;  Location: [facility];  Service: General;  Laterality: N/A;  . SINUS EXPLORATION  2005/2007  . TEE WITHOUT CARDIOVERSION N/A 10/24/2013   Procedure: TRANSESOPHAGEAL ECHOCARDIOGRAM (TEE);  Surgeon: Fay Records, MD;  Location: [facility] ENDOSCOPY;  Service: Cardiovascular;  Laterality: N/A;   Social History   Occupational History  . Occupation: retired     Fish farm manager: [employer]  Tobacco Use  . Smoking status: Former Smoker    Years: 21.00    Types: Cigarettes, Cigars    Last attempt to quit: 10/20/1990    Years since quitting: 27.5  . Smokeless tobacco: Never Used  Substance and Sexual Activity  . Alcohol use: No    Alcohol/week: 0.0 standard drinks    Frequency: Never  . Drug use: No  . Sexual activity: Never

## 2018-05-10 NOTE — Telephone Encounter (Signed)
I called patient he states his transmetatarsal amputation looks like it is starting to breakdown and is red.  He will continue with his doxycycline and we will see him tomorrow morning in the office.

## 2018-05-11 ENCOUNTER — Other Ambulatory Visit: Payer: Self-pay

## 2018-05-11 ENCOUNTER — Encounter (INDEPENDENT_AMBULATORY_CARE_PROVIDER_SITE_OTHER): Payer: Self-pay | Admitting: Orthopedic Surgery

## 2018-05-11 ENCOUNTER — Ambulatory Visit (INDEPENDENT_AMBULATORY_CARE_PROVIDER_SITE_OTHER): Payer: Medicare HMO | Admitting: Orthopedic Surgery

## 2018-05-11 VITALS — Ht 72.0 in | Wt 215.0 lb

## 2018-05-11 DIAGNOSIS — Z89432 Acquired absence of left foot: Secondary | ICD-10-CM

## 2018-05-11 DIAGNOSIS — L02612 Cutaneous abscess of left foot: Secondary | ICD-10-CM

## 2018-05-11 MED ORDER — AMOXICILLIN-POT CLAVULANATE 875-125 MG PO TABS: 1.0000 | ORAL_TABLET | Freq: Two times a day (BID) | ORAL | 0 refills | Status: DC

## 2018-05-11 NOTE — Progress Notes (Signed)
Office Visit Note   Patient: William Solis           Date of Birth: Aug 26, 1950           MRN: [id number] Visit Date: 05/11/2018              Requested by: Leamon Arnt, [address] [place], [place] [address] PCP: Leamon Arnt, MD  Chief Complaint  Patient presents with  . Left Foot - Routine Post Op    04/10/18 left foot transmet amputation       HPI: Patient is a 68 year old gentleman who presents 4 weeks status post left transmetatarsal amputation.  Patient states that starting yesterday he has had increased redness swelling and warmth.  He states he has had a small amount of clear drainage but no odor.  He has been on doxycycline.  Patient states that Septra has upset his stomach and initially he had an upset stomach from doxycycline as well.  Assessment & Plan: Visit Diagnoses:  1. S/P transmetatarsal amputation of foot, left ([place])   2. Cutaneous abscess of left foot     Plan: We will call in a prescription for Augmentin.  Follow-up in 2 days.  Discussed that if were not seeing any improvement we would need to proceed with surgical debridement of the infection of the well-healed transmetatarsal amputation.  Follow-Up Instructions: Return in about 1 week (around 05/18/2018).   Ortho Exam  Patient is alert, oriented, no adenopathy, well-dressed, normal affect, normal respiratory effort. Examination patient has a strong dorsalis pedis pulse.  He has cellulitis over the forefoot the surgical incision has healed well there is some mild granulation tissue but no fluctuance no abscess.  Imaging: No results found. No images are attached to the encounter.  Labs: Lab Results  Component Value Date   ESRSEDRATE 61 (H) 04/09/2018   ESRSEDRATE 30 (H) 10/19/2013   CRP 2.8 04/09/2018   REPTSTATUS 10/28/2013 FINAL 10/22/2013   GRAMSTAIN  10/19/2013    ABUNDANT WBC PRESENT,BOTH PMN AND MONONUCLEAR NO SQUAMOUS EPITHELIAL CELLS SEEN RARE GRAM POSITIVE COCCI IN PAIRS  IN CLUSTERS Performed at [facility]  10/22/2013    NO GROWTH 5 DAYS Performed at [facility] 10/19/2013     Lab Results  Component Value Date   ALBUMIN 3.7 04/09/2018   ALBUMIN 4.2 06/02/2017   ALBUMIN 3.3 (L) 02/16/2015    Body mass index is 29.16 kg/m.  Orders:  No orders of the defined types were placed in this encounter.  Meds ordered this encounter  Medications  . amoxicillin-clavulanate (AUGMENTIN) 875-125 MG tablet    Sig: Take 1 tablet by mouth 2 (two) times daily.    Dispense:  30 tablet    Refill:  0     Procedures: No procedures performed  Clinical Data: No additional findings.  ROS:  All other systems negative, except as noted in the HPI. Review of Systems  Objective: Vital Signs: Ht 6' (1.829 m)   Wt 215 lb (97.5 kg)   BMI 29.16 kg/m   Specialty Comments:  No specialty comments available.  PMFS History: Patient Active Problem List   Diagnosis Date Noted  . S/P transmetatarsal amputation of foot, left ([place]) 04/10/2018  . Osteomyelitis of left foot ([place]) 04/03/2018  . Postlaminectomy syndrome 04/01/2017  . Lumbar stenosis 03/09/2017  . Fibromyalgia 09/15/2016  . Risk for falls 06/25/2016  . OSA on CPAP 11/07/2015  . Bilateral  edema of lower extremity 10/12/2015  . S/P removal of parathyroid gland ([place]) 12/21/2013  . History of hyperparathyroidism 11/22/2013  . History of osteomyelitis 10/25/2013  . Chronic low back pain 05/12/2012  . Narcotic dependence ([place]) 05/12/2012  . History of hepatitis C 04/19/2010  . Hypertension 04/19/2010  . Dry eye syndrome 11/20/2008  . MDD (major depressive disorder) 11/20/2008  . ADD (attention deficit disorder) 12/01/2006   Past Medical History:  Diagnosis Date  . ADHD, adult residual type    takes Ritalin daily  . Anxiety   . Arthritis   . Bipolar disorder ([place])    "a touch"  . Cutaneous abscess of left foot 04/09/2018  . Depression     takes Lexapro daily  . Hepatitis C    resolved with treatment 2019. released from hepatology  . Hypertension    takes Lisinopril,Metoprolol,and Amlodipine daily  . Insomnia    takes Melatonin nightly  . Left foot pain 04/09/2018  . Muscle spasm    takes Flexeril daily  . Neuropathy involving both lower extremities    takes Gabapentin daily  . Open wound of left foot 04/03/2018  . Pneumonia   . Septic arthritis of left foot ([place]) 04/03/2018    Family History  Problem Relation Age of Onset  . Hypertension Mother   . Hypertension Father   . Diabetes type II Neg Hx     Past Surgical History:  Procedure Laterality Date  . AMPUTATION Left 10/21/2013   Procedure: LEFT FOOT SECOND RAY AMPUTATION ;  Surgeon: [name], MD;  Location: [facility];  Service: Orthopedics;  Laterality: Left;  . AMPUTATION Left 04/10/2018   Procedure: LEFT TRANSMETATARSAL AMPUTATION;  Surgeon: [name], MD;  Location: [facility];  Service: Orthopedics;  Laterality: Left;  . BACK SURGERY  2006   Disc  . COLONOSCOPY    . PARATHYROIDECTOMY N/A 12/21/2013   Procedure: PARATHYROIDECTOMY;  Surgeon: Ralene Ok, MD;  Location: [facility];  Service: General;  Laterality: N/A;  . SINUS EXPLORATION  2005/2007  . TEE WITHOUT CARDIOVERSION N/A 10/24/2013   Procedure: TRANSESOPHAGEAL ECHOCARDIOGRAM (TEE);  Surgeon: Fay Records, MD;  Location: [facility] ENDOSCOPY;  Service: Cardiovascular;  Laterality: N/A;   Social History   Occupational History  . Occupation: retired     Fish farm manager: [employer]  Tobacco Use  . Smoking status: Former Smoker    Years: 21.00    Types: Cigarettes, Cigars    Last attempt to quit: 10/20/1990    Years since quitting: 27.5  . Smokeless tobacco: Never Used  Substance and Sexual Activity  . Alcohol use: No    Alcohol/week: 0.0 standard drinks    Frequency: Never  . Drug use: No  . Sexual activity: Never

## 2018-05-13 ENCOUNTER — Ambulatory Visit (INDEPENDENT_AMBULATORY_CARE_PROVIDER_SITE_OTHER): Payer: Medicare HMO | Admitting: Orthopedic Surgery

## 2018-05-13 ENCOUNTER — Other Ambulatory Visit: Payer: Self-pay

## 2018-05-13 ENCOUNTER — Encounter (INDEPENDENT_AMBULATORY_CARE_PROVIDER_SITE_OTHER): Payer: Self-pay | Admitting: Orthopedic Surgery

## 2018-05-13 VITALS — Ht 72.0 in | Wt 215.0 lb

## 2018-05-13 DIAGNOSIS — Z89432 Acquired absence of left foot: Secondary | ICD-10-CM

## 2018-05-13 NOTE — Progress Notes (Signed)
Office Visit Note   Patient: William Solis           Date of Birth: Jun 12, 1950           MRN: [id number] Visit Date: 05/13/2018              Requested by: Leamon Arnt, [address] [place], [place] [address] PCP: Leamon Arnt, MD  Chief Complaint  Patient presents with  . Left Foot - Routine Post Op    04/10/2018 left TMA      HPI: Patient presents 4 weeks status post left transmetatarsal amputation.  He is still on Augmentin.  He is weightbearing with a cane has some edema denies any pain or drainage.  Assessment & Plan: Visit Diagnoses:  1. S/P transmetatarsal amputation of foot, left ([place])     Plan: Discussed the importance of compression minimizing weightbearing elevation and complete the antibiotics.  Will reevaluate on Monday.  Follow-Up Instructions: Return in about 1 week (around 05/20/2018).   Ortho Exam  Patient is alert, oriented, no adenopathy, well-dressed, normal affect, normal respiratory effort. Examination patient is full weightbearing with a cane the redness has decreased there is no fluctuance there is no tenderness to palpation.  There is no drainage the wound edges are well approximated.  A extra-large stump shrinker was applied and this had a good fit.  Imaging: No results found. No images are attached to the encounter.  Labs: Lab Results  Component Value Date   ESRSEDRATE 61 (H) 04/09/2018   ESRSEDRATE 30 (H) 10/19/2013   CRP 2.8 04/09/2018   REPTSTATUS 10/28/2013 FINAL 10/22/2013   GRAMSTAIN  10/19/2013    ABUNDANT WBC PRESENT,BOTH PMN AND MONONUCLEAR NO SQUAMOUS EPITHELIAL CELLS SEEN RARE GRAM POSITIVE COCCI IN PAIRS IN CLUSTERS Performed at [facility]  10/22/2013    NO GROWTH 5 DAYS Performed at [facility] 10/19/2013     Lab Results  Component Value Date   ALBUMIN 3.7 04/09/2018   ALBUMIN 4.2 06/02/2017   ALBUMIN 3.3 (L) 02/16/2015    Body mass index is  29.16 kg/m.  Orders:  No orders of the defined types were placed in this encounter.  No orders of the defined types were placed in this encounter.    Procedures: No procedures performed  Clinical Data: No additional findings.  ROS:  All other systems negative, except as noted in the HPI. Review of Systems  Objective: Vital Signs: Ht 6' (1.829 m)   Wt 215 lb (97.5 kg)   BMI 29.16 kg/m   Specialty Comments:  No specialty comments available.  PMFS History: Patient Active Problem List   Diagnosis Date Noted  . S/P transmetatarsal amputation of foot, left ([place]) 04/10/2018  . Osteomyelitis of left foot ([place]) 04/03/2018  . Postlaminectomy syndrome 04/01/2017  . Lumbar stenosis 03/09/2017  . Fibromyalgia 09/15/2016  . Risk for falls 06/25/2016  . OSA on CPAP 11/07/2015  . Bilateral edema of lower extremity 10/12/2015  . S/P removal of parathyroid gland ([place]) 12/21/2013  . History of hyperparathyroidism 11/22/2013  . History of osteomyelitis 10/25/2013  . Chronic low back pain 05/12/2012  . Narcotic dependence ([place]) 05/12/2012  . History of hepatitis C 04/19/2010  . Hypertension 04/19/2010  . Dry eye syndrome 11/20/2008  . MDD (major depressive disorder) 11/20/2008  . ADD (attention deficit disorder) 12/01/2006   Past Medical History:  Diagnosis Date  . ADHD, adult residual type  takes Ritalin daily  . Anxiety   . Arthritis   . Bipolar disorder ([place])    "a touch"  . Cutaneous abscess of left foot 04/09/2018  . Depression    takes Lexapro daily  . Hepatitis C    resolved with treatment 2019. released from hepatology  . Hypertension    takes Lisinopril,Metoprolol,and Amlodipine daily  . Insomnia    takes Melatonin nightly  . Left foot pain 04/09/2018  . Muscle spasm    takes Flexeril daily  . Neuropathy involving both lower extremities    takes Gabapentin daily  . Open wound of left foot 04/03/2018  . Pneumonia   . Septic arthritis of left foot ([place])  04/03/2018    Family History  Problem Relation Age of Onset  . Hypertension Mother   . Hypertension Father   . Diabetes type II Neg Hx     Past Surgical History:  Procedure Laterality Date  . AMPUTATION Left 10/21/2013   Procedure: LEFT FOOT SECOND RAY AMPUTATION ;  Surgeon: [name], MD;  Location: [facility];  Service: Orthopedics;  Laterality: Left;  . AMPUTATION Left 04/10/2018   Procedure: LEFT TRANSMETATARSAL AMPUTATION;  Surgeon: [name], MD;  Location: [facility];  Service: Orthopedics;  Laterality: Left;  . BACK SURGERY  2006   Disc  . COLONOSCOPY    . PARATHYROIDECTOMY N/A 12/21/2013   Procedure: PARATHYROIDECTOMY;  Surgeon: Ralene Ok, MD;  Location: [facility];  Service: General;  Laterality: N/A;  . SINUS EXPLORATION  2005/2007  . TEE WITHOUT CARDIOVERSION N/A 10/24/2013   Procedure: TRANSESOPHAGEAL ECHOCARDIOGRAM (TEE);  Surgeon: Fay Records, MD;  Location: [facility] ENDOSCOPY;  Service: Cardiovascular;  Laterality: N/A;   Social History   Occupational History  . Occupation: retired     Fish farm manager: [employer]  Tobacco Use  . Smoking status: Former Smoker    Years: 21.00    Types: Cigarettes, Cigars    Last attempt to quit: 10/20/1990    Years since quitting: 27.5  . Smokeless tobacco: Never Used  Substance and Sexual Activity  . Alcohol use: No    Alcohol/week: 0.0 standard drinks    Frequency: Never  . Drug use: No  . Sexual activity: Never

## 2018-05-17 ENCOUNTER — Other Ambulatory Visit: Payer: Self-pay

## 2018-05-17 ENCOUNTER — Encounter (INDEPENDENT_AMBULATORY_CARE_PROVIDER_SITE_OTHER): Payer: Self-pay | Admitting: Orthopedic Surgery

## 2018-05-17 ENCOUNTER — Ambulatory Visit (INDEPENDENT_AMBULATORY_CARE_PROVIDER_SITE_OTHER): Payer: Medicare HMO | Admitting: Physician Assistant

## 2018-05-17 VITALS — Ht 72.0 in | Wt 215.0 lb

## 2018-05-17 DIAGNOSIS — Z89432 Acquired absence of left foot: Secondary | ICD-10-CM

## 2018-05-17 NOTE — Progress Notes (Signed)
Office Visit Note   Patient: William Solis           Date of Birth: 03-27-1950           MRN: [id number] Visit Date: 05/17/2018              Requested by: Leamon Arnt, [address] [place], [place] [address] PCP: Leamon Arnt, MD  Chief Complaint  Patient presents with  . Left Foot - Routine Post Op    04/10/18 left transmet amputation       HPI: The patient is a 68 year old gentleman who is seen for postoperative follow-up following a left transmetatarsal amputation on 04/10/2018.  He is weightbearing as tolerated with a cane and postop shoe.  He is on Augmentin twice daily.  He has noticed some minimal drainage on the dressing.  He does have a medical compression sock but he does not have it on today as it was drying after being washed..  Assessment & Plan: Visit Diagnoses:  1. S/P transmetatarsal amputation of foot, left (HCC)     Plan: Continue Augmentin , continue off loading and elevating the left foot as much as possible.  Continue to wear medical compression sock around-the-clock except for showering.  Follow-up in 1 week.   Follow-Up Instructions: Return in about 1 week (around 05/24/2018).   Ortho Exam  Patient is alert, oriented, no adenopathy, well-dressed, normal affect, normal respiratory effort. The left transmetatarsal amputation site has some very scant serous drainage over the incision line with some minimal yellow slough present.  There is some very mild erythema and edema residually.  He has a palpable dorsalis pedis pulse.  Imaging: No results found.   Labs: Lab Results  Component Value Date   ESRSEDRATE 61 (H) 04/09/2018   ESRSEDRATE 30 (H) 10/19/2013   CRP 2.8 04/09/2018   REPTSTATUS 10/28/2013 FINAL 10/22/2013   GRAMSTAIN  10/19/2013    ABUNDANT WBC PRESENT,BOTH PMN AND MONONUCLEAR NO SQUAMOUS EPITHELIAL CELLS SEEN RARE GRAM POSITIVE COCCI IN PAIRS IN CLUSTERS Performed at [facility]  10/22/2013    NO  GROWTH 5 DAYS Performed at [facility] 10/19/2013     Lab Results  Component Value Date   ALBUMIN 3.7 04/09/2018   ALBUMIN 4.2 06/02/2017   ALBUMIN 3.3 (L) 02/16/2015    Body mass index is 29.16 kg/m.  Orders:  No orders of the defined types were placed in this encounter.  No orders of the defined types were placed in this encounter.    Procedures: No procedures performed  Clinical Data: No additional findings.  ROS:  All other systems negative, except as noted in the HPI. Review of Systems  Objective: Vital Signs: Ht 6' (1.829 m)   Wt 215 lb (97.5 kg)   BMI 29.16 kg/m   Specialty Comments:  No specialty comments available.  PMFS History: Patient Active Problem List   Diagnosis Date Noted  . S/P transmetatarsal amputation of foot, left ([place]) 04/10/2018  . Osteomyelitis of left foot ([place]) 04/03/2018  . Postlaminectomy syndrome 04/01/2017  . Lumbar stenosis 03/09/2017  . Fibromyalgia 09/15/2016  . Risk for falls 06/25/2016  . OSA on CPAP 11/07/2015  . Bilateral edema of lower extremity 10/12/2015  . S/P removal of parathyroid gland ([facility]) 12/21/2013  . History of hyperparathyroidism 11/22/2013  . History of osteomyelitis 10/25/2013  . Chronic low back pain 05/12/2012  . Narcotic dependence ([place]) 05/12/2012  .  History of hepatitis C 04/19/2010  . Hypertension 04/19/2010  . Dry eye syndrome 11/20/2008  . MDD (major depressive disorder) 11/20/2008  . ADD (attention deficit disorder) 12/01/2006   Past Medical History:  Diagnosis Date  . ADHD, adult residual type    takes Ritalin daily  . Anxiety   . Arthritis   . Bipolar disorder ([place])    "a touch"  . Cutaneous abscess of left foot 04/09/2018  . Depression    takes Lexapro daily  . Hepatitis C    resolved with treatment 2019. released from hepatology  . Hypertension    takes Lisinopril,Metoprolol,and Amlodipine daily  . Insomnia    takes Melatonin nightly   . Left foot pain 04/09/2018  . Muscle spasm    takes Flexeril daily  . Neuropathy involving both lower extremities    takes Gabapentin daily  . Open wound of left foot 04/03/2018  . Pneumonia   . Septic arthritis of left foot ([place]) 04/03/2018    Family History  Problem Relation Age of Onset  . Hypertension Mother   . Hypertension Father   . Diabetes type II Neg Hx     Past Surgical History:  Procedure Laterality Date  . AMPUTATION Left 10/21/2013   Procedure: LEFT FOOT SECOND RAY AMPUTATION ;  Surgeon: [name], MD;  Location: [facility];  Service: Orthopedics;  Laterality: Left;  . AMPUTATION Left 04/10/2018   Procedure: LEFT TRANSMETATARSAL AMPUTATION;  Surgeon: [name], MD;  Location: [facility];  Service: Orthopedics;  Laterality: Left;  . BACK SURGERY  2006   Disc  . COLONOSCOPY    . PARATHYROIDECTOMY N/A 12/21/2013   Procedure: PARATHYROIDECTOMY;  Surgeon: Ralene Ok, MD;  Location: [facility];  Service: General;  Laterality: N/A;  . SINUS EXPLORATION  2005/2007  . TEE WITHOUT CARDIOVERSION N/A 10/24/2013   Procedure: TRANSESOPHAGEAL ECHOCARDIOGRAM (TEE);  Surgeon: Fay Records, MD;  Location[facility] ENDOSCOPY;  Service: Cardiovascular;  Laterality: N/A;   Social History   Occupational History  . Occupation: retired     Fish farm manager: [employer]  Tobacco Use  . Smoking status: Former Smoker    Years: 21.00    Types: Cigarettes, Cigars    Last attempt to quit: 10/20/1990    Years since quitting: 27.5  . Smokeless tobacco: Never Used  Substance and Sexual Activity  . Alcohol use: No    Alcohol/week: 0.0 standard drinks    Frequency: Never  . Drug use: No  . Sexual activity: Never

## 2018-05-18 ENCOUNTER — Encounter (INDEPENDENT_AMBULATORY_CARE_PROVIDER_SITE_OTHER): Payer: Self-pay | Admitting: Physician Assistant

## 2018-05-25 ENCOUNTER — Other Ambulatory Visit: Payer: Self-pay

## 2018-05-25 ENCOUNTER — Ambulatory Visit (INDEPENDENT_AMBULATORY_CARE_PROVIDER_SITE_OTHER): Payer: Medicare HMO | Admitting: Orthopedic Surgery

## 2018-05-25 ENCOUNTER — Encounter: Payer: Self-pay | Admitting: Orthopedic Surgery

## 2018-05-25 VITALS — Ht 72.0 in | Wt 215.0 lb

## 2018-05-25 DIAGNOSIS — Z89432 Acquired absence of left foot: Secondary | ICD-10-CM

## 2018-05-25 NOTE — Progress Notes (Signed)
Office Visit Note   Patient: William Solis           Date of Birth: 11/30/1950           MRN: [id number] Visit Date: 05/25/2018              Requested by: Leamon Arnt, [address] [place], [place] [address] PCP: Leamon Arnt, MD  Chief Complaint  Patient presents with  . Left Foot - Routine Post Op    04/10/2018 left TMA      HPI: Patient is a 68 year old gentleman who is 6 weeks status post left transmetatarsal amputation.  He is full weightbearing with a cane.  He states that he has no pain no odor no drainage she states the redness is resolving he states there is less redness and swelling in the morning he has completed his antibiotics.  Assessment & Plan: Visit Diagnoses:  1. S/P transmetatarsal amputation of foot, left ([place])     Plan: Patient will follow-up with [facility] for shoe spacer carbon plate and double upright brace.  Follow-Up Instructions: Return in about 4 weeks (around 06/22/2018).   Ortho Exam  Patient is alert, oriented, no adenopathy, well-dressed, normal affect, normal respiratory effort. Examination the redness and swelling and decreased there is no tenderness to palpation no clinical signs of infection with elevation there is decreased redness.  Imaging: No results found. No images are attached to the encounter.  Labs: Lab Results  Component Value Date   ESRSEDRATE 61 (H) 04/09/2018   ESRSEDRATE 30 (H) 10/19/2013   CRP 2.8 04/09/2018   REPTSTATUS 10/28/2013 FINAL 10/22/2013   GRAMSTAIN  10/19/2013    ABUNDANT WBC PRESENT,BOTH PMN AND MONONUCLEAR NO SQUAMOUS EPITHELIAL CELLS SEEN RARE GRAM POSITIVE COCCI IN PAIRS IN CLUSTERS Performed at [facility]  10/22/2013    NO GROWTH 5 DAYS Performed at [facility] 10/19/2013     Lab Results  Component Value Date   ALBUMIN 3.7 04/09/2018   ALBUMIN 4.2 06/02/2017   ALBUMIN 3.3 (L) 02/16/2015    Body mass index is 29.16  kg/m.  Orders:  No orders of the defined types were placed in this encounter.  No orders of the defined types were placed in this encounter.    Procedures: No procedures performed  Clinical Data: No additional findings.  ROS:  All other systems negative, except as noted in the HPI. Review of Systems  Objective: Vital Signs: Ht 6' (1.829 m)   Wt 215 lb (97.5 kg)   BMI 29.16 kg/m   Specialty Comments:  No specialty comments available.  PMFS History: Patient Active Problem List   Diagnosis Date Noted  . S/P transmetatarsal amputation of foot, left ([place]) 04/10/2018  . Osteomyelitis of left foot ([place]) 04/03/2018  . Postlaminectomy syndrome 04/01/2017  . Lumbar stenosis 03/09/2017  . Fibromyalgia 09/15/2016  . Risk for falls 06/25/2016  . OSA on CPAP 11/07/2015  . Bilateral edema of lower extremity 10/12/2015  . S/P removal of parathyroid gland ([facility]) 12/21/2013  . History of hyperparathyroidism 11/22/2013  . History of osteomyelitis 10/25/2013  . Chronic low back pain 05/12/2012  . Narcotic dependence ([facility]) 05/12/2012  . History of hepatitis C 04/19/2010  . Hypertension 04/19/2010  . Dry eye syndrome 11/20/2008  . MDD (major depressive disorder) 11/20/2008  . ADD (attention deficit disorder) 12/01/2006   Past Medical History:  Diagnosis Date  . ADHD, adult residual type  takes Ritalin daily  . Anxiety   . Arthritis   . Bipolar disorder ([facility])    "a touch"  . Cutaneous abscess of left foot 04/09/2018  . Depression    takes Lexapro daily  . Hepatitis C    resolved with treatment 2019. released from hepatology  . Hypertension    takes Lisinopril,Metoprolol,and Amlodipine daily  . Insomnia    takes Melatonin nightly  . Left foot pain 04/09/2018  . Muscle spasm    takes Flexeril daily  . Neuropathy involving both lower extremities    takes Gabapentin daily  . Open wound of left foot 04/03/2018  . Pneumonia   . Septic arthritis of left foot ([place]) 04/03/2018     Family History  Problem Relation Age of Onset  . Hypertension Mother   . Hypertension Father   . Diabetes type II Neg Hx     Past Surgical History:  Procedure Laterality Date  . AMPUTATION Left 10/21/2013   Procedure: LEFT FOOT SECOND RAY AMPUTATION ;  Surgeon: [name], MD;  Location: [facility];  Service: Orthopedics;  Laterality: Left;  . AMPUTATION Left 04/10/2018   Procedure: LEFT TRANSMETATARSAL AMPUTATION;  Surgeon: [name], MD;  Location: [facility];  Service: Orthopedics;  Laterality: Left;  . BACK SURGERY  2006   Disc  . COLONOSCOPY    . PARATHYROIDECTOMY N/A 12/21/2013   Procedure: PARATHYROIDECTOMY;  Surgeon: Ralene Ok, MD;  Location: [facility];  Service: General;  Laterality: N/A;  . SINUS EXPLORATION  2005/2007  . TEE WITHOUT CARDIOVERSION N/A 10/24/2013   Procedure: TRANSESOPHAGEAL ECHOCARDIOGRAM (TEE);  Surgeon: Fay Records, MD;  Location: [facility] ENDOSCOPY;  Service: Cardiovascular;  Laterality: N/A;   Social History   Occupational History  . Occupation: retired     Fish farm manager: [employer]  Tobacco Use  . Smoking status: Former Smoker    Years: 21.00    Types: Cigarettes, Cigars    Last attempt to quit: 10/20/1990    Years since quitting: 27.6  . Smokeless tobacco: Never Used  Substance and Sexual Activity  . Alcohol use: No    Alcohol/week: 0.0 standard drinks    Frequency: Never  . Drug use: No  . Sexual activity: Never

## 2018-06-02 ENCOUNTER — Encounter: Payer: Medicare HMO | Admitting: Family Medicine

## 2018-06-03 ENCOUNTER — Encounter: Payer: Self-pay | Admitting: Family Medicine

## 2018-06-03 ENCOUNTER — Ambulatory Visit (INDEPENDENT_AMBULATORY_CARE_PROVIDER_SITE_OTHER): Payer: Medicare HMO | Admitting: Family Medicine

## 2018-06-03 ENCOUNTER — Other Ambulatory Visit: Payer: Self-pay

## 2018-06-03 VITALS — BP 125/78 | Wt 215.0 lb

## 2018-06-03 DIAGNOSIS — I10 Essential (primary) hypertension: Secondary | ICD-10-CM | POA: Diagnosis not present

## 2018-06-03 DIAGNOSIS — N179 Acute kidney failure, unspecified: Secondary | ICD-10-CM | POA: Diagnosis not present

## 2018-06-03 DIAGNOSIS — Z89432 Acquired absence of left foot: Secondary | ICD-10-CM | POA: Diagnosis not present

## 2018-06-03 DIAGNOSIS — G8929 Other chronic pain: Secondary | ICD-10-CM | POA: Diagnosis not present

## 2018-06-03 DIAGNOSIS — M545 Low back pain, unspecified: Secondary | ICD-10-CM

## 2018-06-03 DIAGNOSIS — M797 Fibromyalgia: Secondary | ICD-10-CM

## 2018-06-03 DIAGNOSIS — F3341 Major depressive disorder, recurrent, in partial remission: Secondary | ICD-10-CM

## 2018-06-03 MED ORDER — AMLODIPINE BESYLATE 10 MG PO TABS: 10.0000 mg | ORAL_TABLET | Freq: Every day | ORAL | 3 refills | Status: DC

## 2018-06-03 MED ORDER — HYDROCHLOROTHIAZIDE 25 MG PO TABS: 25.0000 mg | ORAL_TABLET | Freq: Every day | ORAL | 3 refills | Status: DC

## 2018-06-03 MED ORDER — LISINOPRIL 40 MG PO TABS: 40.0000 mg | ORAL_TABLET | Freq: Every day | ORAL | 3 refills | Status: DC

## 2018-06-03 NOTE — Progress Notes (Signed)
Virtual Visit via Video Note  Subjective  CC:  Chief Complaint  Patient presents with  . Hypertension  . Depression  . Fibromyalgia     I connected with William Solis on 06/03/18 at  8:20 AM EDT by a video enabled telemedicine application and verified that I am speaking with the correct person using two identifiers. Location patient: Home Location provider: [facility] participating in the virtual visit: Kowen, [place], MD Lilli Light, [place] discussed the limitations of evaluation and management by telemedicine and the availability of in person appointments. The patient expressed understanding and agreed to proceed. HPI: William Solis is a 68 y.o. male who was contacted today to address the problems listed above in the chief complaint/HTN f/u:   Hypertension f/u: Control is good . Pt reports he is doing well. taking medications as instructed, no medication side effects noted, no TIAs, no chest pain on exertion, no dyspnea on exertion, no swelling of ankles.  Home readings have been in the normal range between 120-130 over 70s to 80s consistently. denies adverse effects from his BP medications. Compliance with medication is good.  He is due medication refills.  Status post transmetatarsal amputation of the foot in late March.  Now off antibiotics and doing better.  He still has weekly follow-up with orthopedics.  I have been following along.  Of note, his most recent CMP in the day of surgery showed an acute kidney injury of creatinine 1.98.  This has not been rechecked.  He is on an ACE inhibitor and NSAIDs daily.  He denies worsening lower extremity edema nausea or vomiting.  This does need to be rechecked.  Major depression: 6 months ago we restarted Celexa.  Fortunately he had a good response to this.  He now is feeling back to normal.  He denies symptoms of depression.  His PHQ 9 is negative.  No adverse effects.   He is also handling the partial amputation of his foot well.  He is dealing with the stressors of the state home order due to [place] well also.  Fibromyalgia and chronic pain: Chronic narcotics and NSAIDs and muscle relaxers.  He feels his pain is well controlled.  This is managed by neurology.  Health maintenance: His CPE was deferred to August.  BP Readings from Last 3 Encounters:  06/03/18 125/78  04/11/18 129/71  04/09/18 118/82   Wt Readings from Last 3 Encounters:  06/03/18 215 lb (97.5 kg)  05/25/18 215 lb (97.5 kg)  05/17/18 215 lb (97.5 kg)    Lab Results  Component Value Date   CHOL 173 06/02/2017   Lab Results  Component Value Date   HDL 42.00 06/02/2017   Lab Results  Component Value Date   LDLCALC 105 (H) 06/02/2017   Lab Results  Component Value Date   TRIG 129.0 06/02/2017   Lab Results  Component Value Date   CHOLHDL 4 06/02/2017   No results found for: LDLDIRECT Lab Results  Component Value Date   CREATININE 1.98 (H) 04/09/2018   BUN 29 (H) 04/09/2018   NA 134 (L) 04/09/2018   K 4.7 04/09/2018   CL 98 04/09/2018   CO2 24 04/09/2018    The 10-year ASCVD risk score William Solis DC Jr., et al., 2013) is: 14.2%   Values used to calculate the score:     Age: 37 years     Sex: Male  Is Non-Hispanic African American: No     Diabetic: No     Tobacco smoker: No     Systolic Blood Pressure: 010 mmHg     Is BP treated: Yes     HDL Cholesterol: 42 mg/dL     Total Cholesterol: 173 mg/dL  Assessment  1. Essential hypertension   2. Chronic bilateral low back pain without sciatica   3. Recurrent major depressive disorder, in partial remission ([place])   4. Fibromyalgia   5. AKI (acute kidney injury) ([place])   6. S/P transmetatarsal amputation of foot, left ([place])      Plan   See below for problem based assessment and plan documentation Orders Placed This Encounter  Procedures  . CBC with Differential/Platelet    Standing Status:   Future    Standing  Expiration Date:   07/03/2018  . Comprehensive metabolic panel    Standing Status:   Future    Standing Expiration Date:   07/03/2018  . Lipid panel    Standing Status:   Future    Standing Expiration Date:   07/03/2018  . POCT urinalysis dipstick    Standing Status:   Future    Standing Expiration Date:   07/03/2018     I discussed the assessment and treatment plan with the patient. The patient was provided an opportunity to ask questions and all were answered. The patient agreed with the plan and demonstrated an understanding of the instructions.   The patient was advised to call back or seek an in-person evaluation if the symptoms worsen or if the condition fails to improve as anticipated. Follow up: Return for as scheduled, complete physical.  09/07/2018  Meds ordered this encounter  Medications  . amLODipine (NORVASC) 10 MG tablet    Sig: Take 1 tablet (10 mg total) by mouth daily.    Dispense:  90 tablet    Refill:  3  . hydrochlorothiazide (HYDRODIURIL) 25 MG tablet    Sig: Take 1 tablet (25 mg total) by mouth daily.    Dispense:  90 tablet    Refill:  3  . lisinopril (ZESTRIL) 40 MG tablet    Sig: Take 1 tablet (40 mg total) by mouth daily.    Dispense:  90 tablet    Refill:  3      I reviewed the patients updated PMH, FH, and SocHx.    Patient Active Problem List   Diagnosis Date Noted  . Postlaminectomy syndrome 04/01/2017    Priority: High  . OSA on CPAP 11/07/2015    Priority: High  . History of osteomyelitis 10/25/2013    Priority: High  . Chronic low back pain 05/12/2012    Priority: High  . Narcotic dependence ([place]) 05/12/2012    Priority: High  . History of hepatitis C 04/19/2010    Priority: High  . Essential hypertension 04/19/2010    Priority: High  . MDD (major depressive disorder) 11/20/2008    Priority: High  . ADD (attention deficit disorder) 12/01/2006    Priority: High  . Lumbar stenosis 03/09/2017    Priority: Medium  . Fibromyalgia  09/15/2016    Priority: Medium  . Risk for falls 06/25/2016    Priority: Medium  . Bilateral edema of lower extremity 10/12/2015    Priority: Medium  . S/P removal of parathyroid gland ([place]) 12/21/2013    Priority: Low  . History of hyperparathyroidism 11/22/2013    Priority: Low  . Dry eye syndrome 11/20/2008    Priority:  Low  . S/P transmetatarsal amputation of foot, left ([place]) 04/10/2018  . Osteomyelitis of left foot ([place]) 04/03/2018   Current Meds  Medication Sig  . amLODipine (NORVASC) 10 MG tablet Take 1 tablet (10 mg total) by mouth daily.  [place] EX Apply 1 application topically 3 (three) times daily as needed (arthritis pain).  . Ascorbic Acid (VITAMIN C) 1000 MG tablet Take 1,500 mg by mouth daily.   . carvedilol (COREG) 12.5 MG tablet Take 12.5 mg by mouth 2 (two) times daily.  . cholecalciferol (VITAMIN D3) 25 MCG (1000 UT) tablet Take 1,000 Units by mouth daily.  . citalopram (CELEXA) 20 MG tablet Take 1 tablet (20 mg total) by mouth daily.  . Cyanocobalamin (VITAMIN B-12 PO) Take 1 tablet by mouth every 3 (three) days.  . cyclobenzaprine (FLEXERIL) 10 MG tablet Take 10 mg by mouth 3 (three) times daily as needed for muscle spasms.  . Glucosamine HCl 1500 MG TABS Take 1,500 mg by mouth daily.  . hydrochlorothiazide (HYDRODIURIL) 25 MG tablet Take 1 tablet (25 mg total) by mouth daily.  Marland Kitchen lidocaine (LIDODERM) 5 % Place 1 patch onto the skin daily as needed (pain). Remove & Discard patch within 12 hours or as directed by MD  . lisinopril (ZESTRIL) 40 MG tablet Take 1 tablet (40 mg total) by mouth daily.  . Melatonin 3 MG TABS Take 3 mg by mouth at bedtime.   . meloxicam (MOBIC) 15 MG tablet Take 15 mg by mouth daily as needed (arthritis pain (hands)).   . Multiple Vitamins-Minerals (MULTIVITAMIN WITH MINERALS) tablet Take 1 tablet by mouth daily.  . Omega-3 Fatty Acids (FISH OIL) 1200 MG CAPS Take 1,200 mg by mouth daily.   Marland Kitchen oxyCODONE (OXY IR/ROXICODONE) 5 MG immediate  release tablet Take 1-2 tablets (5-10 mg total) by mouth every 4 (four) hours as needed for moderate pain (pain score 4-6).  . Tetrahydrozoline HCl (VISINE OP) Place 1 drop into both eyes 3 (three) times daily as needed (dry eyes).  . [DISCONTINUED] amLODipine (NORVASC) 10 MG tablet Take 1 tablet (10 mg total) by mouth daily.  . [DISCONTINUED] aspirin EC 325 MG EC tablet Take 1 tablet (325 mg total) by mouth daily.  . [DISCONTINUED] hydrochlorothiazide (HYDRODIURIL) 25 MG tablet Take 1 tablet (25 mg total) by mouth daily.  . [DISCONTINUED] lisinopril (PRINIVIL,ZESTRIL) 40 MG tablet Take 1 tablet (40 mg total) by mouth daily.    Allergies: Patient has No Known Allergies. Family History: Patient family history includes Hypertension in his father and mother. Social History:  Patient  reports that he quit smoking about 27 years ago. His smoking use included cigarettes and cigars. He quit after 21.00 years of use. He has never used smokeless tobacco. He reports that he does not drink alcohol or use drugs.  Review of Systems: Constitutional: Negative for fever malaise or anorexia Cardiovascular: negative for chest pain Respiratory: negative for SOB or persistent cough Gastrointestinal: negative for abdominal pain  OBJECTIVE Vitals: BP 125/78   Wt 215 lb (97.5 kg)   BMI 29.16 kg/m  General: no acute distress , A&Ox3  Leamon Arnt, MD

## 2018-06-03 NOTE — Assessment & Plan Note (Signed)
Following along with Dr. Sharol Given; infection is resolving now off antibiotics. Healing well. Has close f/u. No new recs.

## 2018-06-03 NOTE — Assessment & Plan Note (Signed)
Now well controlled back on celexa 20 daily. Neg phq9. Continue medications long term. Handling/coping with amputation well.

## 2018-06-03 NOTE — Assessment & Plan Note (Signed)
Stable and well controlled on chronic narcotics and mobic. Will assess renal function. Managed by neurology

## 2018-06-03 NOTE — Assessment & Plan Note (Signed)
Pain is well controlled.

## 2018-06-03 NOTE — Assessment & Plan Note (Signed)
This medical condition is well controlled. There are no signs of complications, medication side effects, or red flags. Patient is instructed to continue the current treatment plan without change in therapies or medications.  Refilled medications. Check labs, renal and lytes.

## 2018-06-04 ENCOUNTER — Other Ambulatory Visit: Payer: Self-pay

## 2018-06-04 ENCOUNTER — Other Ambulatory Visit (INDEPENDENT_AMBULATORY_CARE_PROVIDER_SITE_OTHER): Payer: Medicare HMO

## 2018-06-04 DIAGNOSIS — N179 Acute kidney failure, unspecified: Secondary | ICD-10-CM

## 2018-06-04 DIAGNOSIS — Z89432 Acquired absence of left foot: Secondary | ICD-10-CM

## 2018-06-04 DIAGNOSIS — I10 Essential (primary) hypertension: Secondary | ICD-10-CM | POA: Diagnosis not present

## 2018-06-04 LAB — COMPREHENSIVE METABOLIC PANEL
ALT: 17 U/L (ref 0–53)
AST: 23 U/L (ref 0–37)
Albumin: 4 g/dL (ref 3.5–5.2)
Alkaline Phosphatase: 83 U/L (ref 39–117)
BUN: 21 mg/dL (ref 6–23)
CO2: 25 mEq/L (ref 19–32)
Calcium: 9.1 mg/dL (ref 8.4–10.5)
Chloride: 103 mEq/L (ref 96–112)
Creatinine, Ser: 1.14 mg/dL (ref 0.40–1.50)
GFR: 64.34 mL/min (ref >60.00)
Glucose, Bld: 100 mg/dL — ABNORMAL HIGH (ref 70–99)
Potassium: 4.3 mEq/L (ref 3.5–5.1)
Sodium: 139 mEq/L (ref 135–145)
Total Bilirubin: 0.6 mg/dL (ref 0.2–1.2)
Total Protein: 7.8 g/dL (ref 6.0–8.3)

## 2018-06-04 LAB — LIPID PANEL
Cholesterol: 155 mg/dL (ref 0–200)
HDL: 36.5 mg/dL — ABNORMAL LOW (ref >39.00)
LDL Cholesterol: 98 mg/dL (ref 0–99)
NonHDL: 118.52
Total CHOL/HDL Ratio: 4
Triglycerides: 101 mg/dL (ref 0.0–149.0)
VLDL: 20.2 mg/dL (ref 0.0–40.0)

## 2018-06-04 LAB — CBC WITH DIFFERENTIAL/PLATELET
Basophils Absolute: 0.1 10*3/uL (ref 0.0–0.1)
Basophils Relative: 0.7 % (ref 0.0–3.0)
Eosinophils Absolute: 0.3 10*3/uL (ref 0.0–0.7)
Eosinophils Relative: 3.7 % (ref 0.0–5.0)
HCT: 41.4 % (ref 39.0–52.0)
Hemoglobin: 13.9 g/dL (ref 13.0–17.0)
Lymphocytes Relative: 17.4 % (ref 12.0–46.0)
Lymphs Abs: 1.5 10*3/uL (ref 0.7–4.0)
MCHC: 33.6 g/dL (ref 30.0–36.0)
MCV: 87.9 fl (ref 78.0–100.0)
Monocytes Absolute: 0.8 10*3/uL (ref 0.1–1.0)
Monocytes Relative: 9.4 % (ref 3.0–12.0)
Neutro Abs: 5.9 10*3/uL (ref 1.4–7.7)
Neutrophils Relative %: 68.8 % (ref 43.0–77.0)
Platelets: 232 10*3/uL (ref 150.0–400.0)
RBC: 4.7 Mil/uL (ref 4.22–5.81)
RDW: 13.4 % (ref 11.5–15.5)
WBC: 8.5 10*3/uL (ref 4.0–10.5)

## 2018-06-04 LAB — POCT URINALYSIS DIPSTICK
Bilirubin, UA: NEGATIVE
Blood, UA: NEGATIVE
Glucose, UA: NEGATIVE
Ketones, UA: NEGATIVE
Leukocytes, UA: NEGATIVE
Nitrite, UA: NEGATIVE
Protein, UA: NEGATIVE
Spec Grav, UA: 1.01 (ref 1.010–1.025)
Urobilinogen, UA: 0.2 E.U./dL
pH, UA: 6 (ref 5.0–8.0)

## 2018-06-07 ENCOUNTER — Ambulatory Visit: Payer: Self-pay | Admitting: Orthopedic Surgery

## 2018-06-08 DIAGNOSIS — M48061 Spinal stenosis, lumbar region without neurogenic claudication: Secondary | ICD-10-CM | POA: Diagnosis not present

## 2018-06-08 DIAGNOSIS — M533 Sacrococcygeal disorders, not elsewhere classified: Secondary | ICD-10-CM | POA: Diagnosis not present

## 2018-06-08 DIAGNOSIS — M961 Postlaminectomy syndrome, not elsewhere classified: Secondary | ICD-10-CM | POA: Diagnosis not present

## 2018-06-08 DIAGNOSIS — G8929 Other chronic pain: Secondary | ICD-10-CM | POA: Diagnosis not present

## 2018-06-11 ENCOUNTER — Ambulatory Visit (INDEPENDENT_AMBULATORY_CARE_PROVIDER_SITE_OTHER): Payer: Medicare HMO | Admitting: Physician Assistant

## 2018-06-11 ENCOUNTER — Other Ambulatory Visit: Payer: Self-pay

## 2018-06-11 ENCOUNTER — Encounter: Payer: Self-pay | Admitting: Physician Assistant

## 2018-06-11 VITALS — Ht 72.0 in | Wt 215.0 lb

## 2018-06-11 DIAGNOSIS — Z89432 Acquired absence of left foot: Secondary | ICD-10-CM

## 2018-06-11 DIAGNOSIS — L97521 Non-pressure chronic ulcer of other part of left foot limited to breakdown of skin: Secondary | ICD-10-CM

## 2018-06-11 MED ORDER — AMOXICILLIN-POT CLAVULANATE 875-125 MG PO TABS: 1.0000 | ORAL_TABLET | Freq: Two times a day (BID) | ORAL | 0 refills | Status: DC

## 2018-06-11 NOTE — Progress Notes (Signed)
Office Visit Note   Patient: William Solis           Date of Birth: 04-Jun-1950           MRN: [id number] Visit Date: 06/11/2018              Requested by: Leamon Arnt, [place],  [address] PCP: Leamon Arnt, MD  Chief Complaint  Patient presents with  . Left Foot - Routine Post Op    04/10/18 left foot transmet amp      HPI: The patient is a 68 yo gentleman who is seen for post operative follow up of a left transmetatarsal amputation on 04/10/2018. He had been working with [employer] clinic for a shoe spacer, carbon plate and double upright bracing .  He developed a small ulcer over the plantar surface of the transmetatarsal amputation site and then started noticing redness over the residual foot over the past 2 days. No fevers or chills. He has tried to elevate and off load the foot as much as possible.   Assessment & Plan: Visit Diagnoses:  1. S/P transmetatarsal amputation of foot, left ([place])   2. Plantar ulcer of left foot, limited to breakdown of skin (HCC)     Plan: Begin Augmentin 875- 125 mg BID. Off load and elevate as much as possible.  iodosorb ointment to the ulcer daily after washing. Resume compression socks if able, otherwise Ace wrap for compression.  Counseled to go the ER if worsening , develops significant fever or chills  Follow up early next week.   Follow-Up Instructions: Return in about 4 days (around 06/15/2018).   Ortho Exam  Patient is alert, oriented, no adenopathy, well-dressed, normal affect, normal respiratory effort. There is erythema and edema of the residual left foot. Scabbing over the incision line without drainage but several blisters present. Plantar ulcer ~ 1 cm diameter and 2 mm depth without exposed tendon or bone.  Biphasic pedal pulses by Doppler.  Imaging: No results found. No images are attached to the encounter.  Labs: Lab Results  Component Value Date   ESRSEDRATE 61 (H) 04/09/2018   ESRSEDRATE 30 (H) 10/19/2013   CRP 2.8 04/09/2018   REPTSTATUS 10/28/2013 FINAL 10/22/2013   GRAMSTAIN  10/19/2013    ABUNDANT WBC PRESENT,BOTH PMN AND MONONUCLEAR NO SQUAMOUS EPITHELIAL CELLS SEEN RARE GRAM POSITIVE COCCI IN PAIRS IN CLUSTERS Performed at [facility]  10/22/2013    NO GROWTH 5 DAYS Performed at [facility] 10/19/2013     Lab Results  Component Value Date   ALBUMIN 4.0 06/04/2018   ALBUMIN 3.7 04/09/2018   ALBUMIN 4.2 06/02/2017    Body mass index is 29.16 kg/m.  Orders:  No orders of the defined types were placed in this encounter.  Meds ordered this encounter  Medications  . amoxicillin-clavulanate (AUGMENTIN) 875-125 MG tablet    Sig: Take 1 tablet by mouth 2 (two) times daily.    Dispense:  28 tablet    Refill:  0     Procedures: No procedures performed  Clinical Data: No additional findings.  ROS:  All other systems negative, except as noted in the HPI. Review of Systems  Objective: Vital Signs: Ht 6' (1.829 m)   Wt 215 lb (97.5 kg)   BMI 29.16 kg/m   Specialty Comments:  No specialty comments available.  PMFS History: Patient Active Problem List   Diagnosis Date  Noted  . S/P transmetatarsal amputation of foot, left ([place]) 04/10/2018  . Osteomyelitis of left foot ([place]) 04/03/2018  . Postlaminectomy syndrome 04/01/2017  . Lumbar stenosis 03/09/2017  . Fibromyalgia 09/15/2016  . Risk for falls 06/25/2016  . OSA on CPAP 11/07/2015  . Bilateral edema of lower extremity 10/12/2015  . S/P removal of parathyroid gland ([place]) 12/21/2013  . History of hyperparathyroidism 11/22/2013  . History of osteomyelitis 10/25/2013  . Chronic low back pain 05/12/2012  . Narcotic dependence ([place]) 05/12/2012  . History of hepatitis C 04/19/2010  . Essential hypertension 04/19/2010  . Dry eye syndrome 11/20/2008  . MDD (major depressive disorder) 11/20/2008  . ADD (attention deficit  disorder) 12/01/2006   Past Medical History:  Diagnosis Date  . ADHD, adult residual type    takes Ritalin daily  . Anxiety   . Arthritis   . Bipolar disorder ([place])    "a touch"  . Cutaneous abscess of left foot 04/09/2018  . Depression    takes Lexapro daily  . Hepatitis C    resolved with treatment 2019. released from hepatology  . Hypertension    takes Lisinopril,Metoprolol,and Amlodipine daily  . Insomnia    takes Melatonin nightly  . Left foot pain 04/09/2018  . Muscle spasm    takes Flexeril daily  . Neuropathy involving both lower extremities    takes Gabapentin daily  . Open wound of left foot 04/03/2018  . Pneumonia   . Septic arthritis of left foot ([place]) 04/03/2018    Family History  Problem Relation Age of Onset  . Hypertension Mother   . Hypertension Father   . Diabetes type II Neg Hx     Past Surgical History:  Procedure Laterality Date  . AMPUTATION Left 10/21/2013   Procedure: LEFT FOOT SECOND RAY AMPUTATION ;  Surgeon: [name], MD;  Location: [facility];  Service: Orthopedics;  Laterality: Left;  . AMPUTATION Left 04/10/2018   Procedure: LEFT TRANSMETATARSAL AMPUTATION;  Surgeon: [name], MD;  Location: [facility];  Service: Orthopedics;  Laterality: Left;  . BACK SURGERY  2006   Disc  . COLONOSCOPY    . PARATHYROIDECTOMY N/A 12/21/2013   Procedure: PARATHYROIDECTOMY;  Surgeon: Ralene Ok, MD;  Location: [facility];  Service: General;  Laterality: N/A;  . SINUS EXPLORATION  2005/2007  . TEE WITHOUT CARDIOVERSION N/A 10/24/2013   Procedure: TRANSESOPHAGEAL ECHOCARDIOGRAM (TEE);  Surgeon: Fay Records, MD;  Location: [facility] ENDOSCOPY;  Service: Cardiovascular;  Laterality: N/A;   Social History   Occupational History  . Occupation: retired     Fish farm manager: [employer]  Tobacco Use  . Smoking status: Former Smoker    Years: 21.00    Types: Cigarettes, Cigars    Last attempt to quit: 10/20/1990    Years since quitting: 27.6  . Smokeless tobacco:  Never Used  Substance and Sexual Activity  . Alcohol use: No    Alcohol/week: 0.0 standard drinks    Frequency: Never  . Drug use: No  . Sexual activity: Never

## 2018-06-15 ENCOUNTER — Other Ambulatory Visit: Payer: Self-pay

## 2018-06-15 ENCOUNTER — Ambulatory Visit (INDEPENDENT_AMBULATORY_CARE_PROVIDER_SITE_OTHER): Payer: Medicare HMO | Admitting: Orthopedic Surgery

## 2018-06-15 ENCOUNTER — Encounter: Payer: Self-pay | Admitting: Orthopedic Surgery

## 2018-06-15 VITALS — Ht 72.0 in | Wt 215.0 lb

## 2018-06-15 DIAGNOSIS — Z89432 Acquired absence of left foot: Secondary | ICD-10-CM

## 2018-06-16 ENCOUNTER — Encounter: Payer: Self-pay | Admitting: Orthopedic Surgery

## 2018-06-16 DIAGNOSIS — S98022A Partial traumatic amputation of left foot at ankle level, initial encounter: Secondary | ICD-10-CM | POA: Diagnosis not present

## 2018-06-16 NOTE — Progress Notes (Signed)
Office Visit Note   Patient: William Solis           Date of Birth: 10/21/1950           MRN: [id number] Visit Date: 06/15/2018              Requested by: Leamon Arnt, [place], [place] [address] PCP: Leamon Arnt, MD  Chief Complaint  Patient presents with  . Left Foot - Routine Post Op    04/10/18 left foot transmet amputation       HPI: Patient is a 68 year old gentleman who presents in follow-up for left transmetatarsal amputation.  Patient states that the swelling is going down he denies any drainage he states he has developed a new ulcer from weightbearing on the plantar aspect of his foot.  Assessment & Plan: Visit Diagnoses:  1. S/P transmetatarsal amputation of foot, left (HCC)     Plan: Discussed the importance of minimizing weightbearing to decrease the risk of worsening of the ulcer on the plantar aspect of the left foot.  Recommended following up with the orthotist for his extra-depth shoes double upright braces carbon plate custom orthotic and spacer.  Follow-Up Instructions: Return in about 2 weeks (around 06/29/2018).   Ortho Exam  Patient is alert, oriented, no adenopathy, well-dressed, normal affect, normal respiratory effort. Examination patient has a strong dorsalis pedis and posterior tibial pulse he has a new Wegner grade 1 ulcer on the plantar aspect of the left foot which is 10 mm in diameter 0.1 mm deep this does not probe to bone or tendon.  His surgical incision is well-healed the surrounding swelling has resolved there is no cellulitis no tenderness to palpation.  Imaging: No results found. No images are attached to the encounter.  Labs: Lab Results  Component Value Date   ESRSEDRATE 61 (H) 04/09/2018   ESRSEDRATE 30 (H) 10/19/2013   CRP 2.8 04/09/2018   REPTSTATUS 10/28/2013 FINAL 10/22/2013   GRAMSTAIN  10/19/2013    ABUNDANT WBC PRESENT,BOTH PMN AND MONONUCLEAR NO SQUAMOUS EPITHELIAL CELLS SEEN RARE GRAM  POSITIVE COCCI IN PAIRS IN CLUSTERS Performed at [facility]  10/22/2013    NO GROWTH 5 DAYS Performed at [facility] 10/19/2013     Lab Results  Component Value Date   ALBUMIN 4.0 06/04/2018   ALBUMIN 3.7 04/09/2018   ALBUMIN 4.2 06/02/2017    Body mass index is 29.16 kg/m.  Orders:  No orders of the defined types were placed in this encounter.  No orders of the defined types were placed in this encounter.    Procedures: No procedures performed  Clinical Data: No additional findings.  ROS:  All other systems negative, except as noted in the HPI. Review of Systems  Objective: Vital Signs: Ht 6' (1.829 m)   Wt 215 lb (97.5 kg)   BMI 29.16 kg/m   Specialty Comments:  No specialty comments available.  PMFS History: Patient Active Problem List   Diagnosis Date Noted  . S/P transmetatarsal amputation of foot, left ([place]) 04/10/2018  . Osteomyelitis of left foot ([place]) 04/03/2018  . Postlaminectomy syndrome 04/01/2017  . Lumbar stenosis 03/09/2017  . Fibromyalgia 09/15/2016  . Risk for falls 06/25/2016  . OSA on CPAP 11/07/2015  . Bilateral edema of lower extremity 10/12/2015  . S/P removal of parathyroid gland ([place]) 12/21/2013  . History of hyperparathyroidism 11/22/2013  . History of osteomyelitis 10/25/2013  .  Chronic low back pain 05/12/2012  . Narcotic dependence ([place]) 05/12/2012  . History of hepatitis C 04/19/2010  . Essential hypertension 04/19/2010  . Dry eye syndrome 11/20/2008  . MDD (major depressive disorder) 11/20/2008  . ADD (attention deficit disorder) 12/01/2006   Past Medical History:  Diagnosis Date  . ADHD, adult residual type    takes Ritalin daily  . Anxiety   . Arthritis   . Bipolar disorder ([place])    "a touch"  . Cutaneous abscess of left foot 04/09/2018  . Depression    takes Lexapro daily  . Hepatitis C    resolved with treatment 2019. released from hepatology  .  Hypertension    takes Lisinopril,Metoprolol,and Amlodipine daily  . Insomnia    takes Melatonin nightly  . Left foot pain 04/09/2018  . Muscle spasm    takes Flexeril daily  . Neuropathy involving both lower extremities    takes Gabapentin daily  . Open wound of left foot 04/03/2018  . Pneumonia   . Septic arthritis of left foot ([place]) 04/03/2018    Family History  Problem Relation Age of Onset  . Hypertension Mother   . Hypertension Father   . Diabetes type II Neg Hx     Past Surgical History:  Procedure Laterality Date  . AMPUTATION Left 10/21/2013   Procedure: LEFT FOOT SECOND RAY AMPUTATION ;  Surgeon: [name], MD;  Location: [facility];  Service: Orthopedics;  Laterality: Left;  . AMPUTATION Left 04/10/2018   Procedure: LEFT TRANSMETATARSAL AMPUTATION;  Surgeon: [name], MD;  Location: [facility];  Service: Orthopedics;  Laterality: Left;  . BACK SURGERY  2006   Disc  . COLONOSCOPY    . PARATHYROIDECTOMY N/A 12/21/2013   Procedure: PARATHYROIDECTOMY;  Surgeon: Ralene Ok, MD;  Location: [facility];  Service: General;  Laterality: N/A;  . SINUS EXPLORATION  2005/2007  . TEE WITHOUT CARDIOVERSION N/A 10/24/2013   Procedure: TRANSESOPHAGEAL ECHOCARDIOGRAM (TEE);  Surgeon: Fay Records, MD;  Location: [facility] ENDOSCOPY;  Service: Cardiovascular;  Laterality: N/A;   Social History   Occupational History  . Occupation: retired     Fish farm manager: [employer]  Tobacco Use  . Smoking status: Former Smoker    Years: 21.00    Types: Cigarettes, Cigars    Last attempt to quit: 10/20/1990    Years since quitting: 27.6  . Smokeless tobacco: Never Used  Substance and Sexual Activity  . Alcohol use: No    Alcohol/week: 0.0 standard drinks    Frequency: Never  . Drug use: No  . Sexual activity: Never

## 2018-06-19 ENCOUNTER — Telehealth: Payer: Self-pay | Admitting: Cardiology

## 2018-06-19 DIAGNOSIS — Z20822 Contact with and (suspected) exposure to covid-19: Secondary | ICD-10-CM

## 2018-06-19 NOTE — Telephone Encounter (Signed)
Left message for patient to call back [phone]  Call is in regards to recent office visit 5/26 and the possible exposure to Covid and to offer testing

## 2018-06-21 ENCOUNTER — Ambulatory Visit: Payer: Medicare HMO | Admitting: Orthopedic Surgery

## 2018-06-24 NOTE — Telephone Encounter (Signed)
Patient called and advised of the potential exposure to COVID-19 at [facility] on 06/11/18 and/or 06/15/18 at [facility] and free testing is being offered, patient verbalized understanding and agrees to testing. Appointment scheduled for tomorrow, 06/25/18 at 0900, advised location and to wear a mask for everyone in the vehicle, patient verbalized understanding.

## 2018-06-24 NOTE — Addendum Note (Signed)
Addended by: Matilde Sprang on: 06/24/2018 04:18 PM   Modules accepted: Orders

## 2018-06-25 ENCOUNTER — Other Ambulatory Visit: Payer: Medicare HMO

## 2018-06-25 DIAGNOSIS — Z20822 Contact with and (suspected) exposure to covid-19: Secondary | ICD-10-CM

## 2018-06-25 DIAGNOSIS — R6889 Other general symptoms and signs: Secondary | ICD-10-CM | POA: Diagnosis not present

## 2018-06-28 LAB — NOVEL CORONAVIRUS, NAA: SARS-CoV-2, NAA: NOT DETECTED

## 2018-06-29 ENCOUNTER — Other Ambulatory Visit: Payer: Self-pay

## 2018-06-29 ENCOUNTER — Encounter: Payer: Self-pay | Admitting: Orthopedic Surgery

## 2018-06-29 ENCOUNTER — Ambulatory Visit (INDEPENDENT_AMBULATORY_CARE_PROVIDER_SITE_OTHER): Payer: Medicare HMO | Admitting: Physician Assistant

## 2018-06-29 VITALS — Ht 72.0 in | Wt 215.0 lb

## 2018-06-29 DIAGNOSIS — Z89432 Acquired absence of left foot: Secondary | ICD-10-CM

## 2018-06-29 DIAGNOSIS — L97521 Non-pressure chronic ulcer of other part of left foot limited to breakdown of skin: Secondary | ICD-10-CM

## 2018-06-29 NOTE — Progress Notes (Signed)
Office Visit Note   Patient: William Solis           Date of Birth: 10/07/1950           MRN: [id number] Visit Date: 06/29/2018              Requested by: Leamon Arnt, [place], [place] [address] PCP: Leamon Arnt, MD  Chief Complaint  Patient presents with  . Left Foot - Routine Post Op    04/10/18 left TMA      HPI: The patient is a 67 yo gentleman who is seen for post operative follow up following a left transmetatarsal amputation on 04/10/2018. He has been continuing to off load and elevate as much as possible. He is wearing a compression sock. He has obtained an extra depth shoe with insert and spacer from [facility] clinic. He is very pleased with how the ulcer is improving.   Assessment & Plan: Visit Diagnoses:  1. S/P transmetatarsal amputation of foot, left ([place])   2. Plantar ulcer of left foot, limited to breakdown of skin ([place])     Plan: Continue to off load and elevate as much as possible. Continue shoe with custom orthotic/insert/spacer. Continue compression sock.  Follow up in about 10 days.   Follow-Up Instructions: Return in about 9 days (around 07/08/2018).   Ortho Exam  Patient is alert, oriented, no adenopathy, well-dressed, normal affect, normal respiratory effort. The left foot transmetatarsal amputation site is well healed and there is less edema. There is a small residual ulcer over the plantar surface  ~ 8  mm diameter without signs of infection or cellulitis of the foot. No drainage from the area. Palpable pedal pulses. Non tender to palpation.   Imaging: No results found.   Labs: Lab Results  Component Value Date   ESRSEDRATE 61 (H) 04/09/2018   ESRSEDRATE 30 (H) 10/19/2013   CRP 2.8 04/09/2018   REPTSTATUS 10/28/2013 FINAL 10/22/2013   GRAMSTAIN  10/19/2013    ABUNDANT WBC PRESENT,BOTH PMN AND MONONUCLEAR NO SQUAMOUS EPITHELIAL CELLS SEEN RARE GRAM POSITIVE COCCI IN PAIRS IN CLUSTERS Performed at [facility]  10/22/2013    NO GROWTH 5 DAYS Performed at [facility] 10/19/2013     Lab Results  Component Value Date   ALBUMIN 4.0 06/04/2018   ALBUMIN 3.7 04/09/2018   ALBUMIN 4.2 06/02/2017    Body mass index is 29.16 kg/m.  Orders:  No orders of the defined types were placed in this encounter.  No orders of the defined types were placed in this encounter.    Procedures: No procedures performed  Clinical Data: No additional findings.  ROS:  All other systems negative, except as noted in the HPI. Review of Systems  Objective: Vital Signs: Ht 6' (1.829 m)   Wt 215 lb (97.5 kg)   BMI 29.16 kg/m   Specialty Comments:  No specialty comments available.  PMFS History: Patient Active Problem List   Diagnosis Date Noted  . S/P transmetatarsal amputation of foot, left (William Solis) 04/10/2018  . Osteomyelitis of left foot (William Solis) 04/03/2018  . Postlaminectomy syndrome 04/01/2017  . Lumbar stenosis 03/09/2017  . Fibromyalgia 09/15/2016  . Risk for falls 06/25/2016  . OSA on CPAP 11/07/2015  . Bilateral edema of lower extremity 10/12/2015  . S/P removal of parathyroid gland (William Solis) 12/21/2013  . History of hyperparathyroidism 11/22/2013  . History of osteomyelitis 10/25/2013  .  Chronic low back pain 05/12/2012  . Narcotic dependence (William Solis) 05/12/2012  . History of hepatitis C 04/19/2010  . Essential hypertension 04/19/2010  . Dry eye syndrome 11/20/2008  . MDD (major depressive disorder) 11/20/2008  . ADD (attention deficit disorder) 12/01/2006   Past Medical History:  Diagnosis Date  . ADHD, adult residual type    takes Ritalin daily  . Anxiety   . Arthritis   . Bipolar disorder ([place])    "a touch"  . Cutaneous abscess of left foot 04/09/2018  . Depression    takes Lexapro daily  . Hepatitis C    resolved with treatment 2019. released from hepatology  . Hypertension    takes Lisinopril,Metoprolol,and Amlodipine  daily  . Insomnia    takes Melatonin nightly  . Left foot pain 04/09/2018  . Muscle spasm    takes Flexeril daily  . Neuropathy involving both lower extremities    takes Gabapentin daily  . Open wound of left foot 04/03/2018  . Pneumonia   . Septic arthritis of left foot ([place]) 04/03/2018    Family History  Problem Relation Age of Onset  . Hypertension Mother   . Hypertension Father   . Diabetes type II Neg Hx     Past Surgical History:  Procedure Laterality Date  . AMPUTATION Left 10/21/2013   Procedure: LEFT FOOT SECOND RAY AMPUTATION ;  Surgeon: [name], MD;  Location: [facility];  Service: Orthopedics;  Laterality: Left;  . AMPUTATION Left 04/10/2018   Procedure: LEFT TRANSMETATARSAL AMPUTATION;  Surgeon: [name], MD;  Location: [facility];  Service: Orthopedics;  Laterality: Left;  . BACK SURGERY  2006   Disc  . COLONOSCOPY    . PARATHYROIDECTOMY N/A 12/21/2013   Procedure: PARATHYROIDECTOMY;  Surgeon: Ralene Ok, MD;  Location: [facility];  Service: General;  Laterality: N/A;  . SINUS EXPLORATION  2005/2007  . TEE WITHOUT CARDIOVERSION N/A 10/24/2013   Procedure: TRANSESOPHAGEAL ECHOCARDIOGRAM (TEE);  Surgeon: Fay Records, MD;  Location[facility] ENDOSCOPY;  Service: Cardiovascular;  Laterality: N/A;   Social History   Occupational History  . Occupation: retired     Fish farm manager: [employer]  Tobacco Use  . Smoking status: Former Smoker    Years: 21.00    Types: Cigarettes, Cigars    Last attempt to quit: 10/20/1990    Years since quitting: 27.7  . Smokeless tobacco: Never Used  Substance and Sexual Activity  . Alcohol use: No    Alcohol/week: 0.0 standard drinks    Frequency: Never  . Drug use: No  . Sexual activity: Never

## 2018-07-08 ENCOUNTER — Ambulatory Visit (INDEPENDENT_AMBULATORY_CARE_PROVIDER_SITE_OTHER): Payer: Medicare HMO | Admitting: Orthopedic Surgery

## 2018-07-08 ENCOUNTER — Encounter: Payer: Self-pay | Admitting: Physician Assistant

## 2018-07-08 ENCOUNTER — Other Ambulatory Visit: Payer: Self-pay

## 2018-07-08 VITALS — Ht 72.0 in | Wt 215.0 lb

## 2018-07-08 DIAGNOSIS — Z89432 Acquired absence of left foot: Secondary | ICD-10-CM

## 2018-07-08 MED ORDER — MOMETASONE FUROATE 0.1 % EX CREA: 1.0000 "application " | TOPICAL_CREAM | Freq: Every day | CUTANEOUS | 3 refills | Status: DC

## 2018-07-08 NOTE — Progress Notes (Signed)
Office Visit Note   Patient: William Solis           Date of Birth: 1950/09/22           MRN: [id number] Visit Date: 07/08/2018              Requested by: Leamon Arnt, [place],  [place] [address] PCP: Leamon Arnt, MD  Chief Complaint  Patient presents with  . Left Foot - Routine Post Op    04/10/2018 left foot transmet amputation       HPI: Patient is a 68 year old gentleman who presents in follow-up status post a transmetatarsal amputation he is 3 months out.  Patient has spacer orthotics and extra-depth shoes he obtained from [facility].  Patient states he has recurrence of a fungal rash on his foot and ankle that he has had in the past.  He is currently wearing the medical compression stockings.  Assessment & Plan: Visit Diagnoses:  1. S/P transmetatarsal amputation of foot, left ([place])     Plan: A prescription was called in for mometasone.  Patient will follow-up as needed he can obtain extra compression stockings from [facility].  Follow-Up Instructions: No follow-ups on file.   Ortho Exam  Patient is alert, oriented, no adenopathy, well-dressed, normal affect, normal respiratory effort. Examination patient has excellent range of motion the ankle he still has a little bit of swelling in the foot and ankle but the incision is well-healed.  He does have circular rashes consistent with a fungal rash around the ankle.  Imaging: No results found. No images are attached to the encounter.  Labs: Lab Results  Component Value Date   ESRSEDRATE 61 (H) 04/09/2018   ESRSEDRATE 30 (H) 10/19/2013   CRP 2.8 04/09/2018   REPTSTATUS 10/28/2013 FINAL 10/22/2013   GRAMSTAIN  10/19/2013    ABUNDANT WBC PRESENT,BOTH PMN AND MONONUCLEAR NO SQUAMOUS EPITHELIAL CELLS SEEN RARE GRAM POSITIVE COCCI IN PAIRS IN CLUSTERS Performed at [facility]  10/22/2013    NO GROWTH 5 DAYS Performed at [facility]  10/19/2013     Lab Results  Component Value Date   ALBUMIN 4.0 06/04/2018   ALBUMIN 3.7 04/09/2018   ALBUMIN 4.2 06/02/2017    Body mass index is 29.16 kg/m.  Orders:  No orders of the defined types were placed in this encounter.  Meds ordered this encounter  Medications  . mometasone (ELOCON) 0.1 % cream    Sig: Apply 1 application topically daily.    Dispense:  45 g    Refill:  3     Procedures: No procedures performed  Clinical Data: No additional findings.  ROS:  All other systems negative, except as noted in the HPI. Review of Systems  Objective: Vital Signs: Ht 6' (1.829 m)   Wt 215 lb (97.5 kg)   BMI 29.16 kg/m   Specialty Comments:  No specialty comments available.  PMFS History: Patient Active Problem List   Diagnosis Date Noted  . S/P transmetatarsal amputation of foot, left ([place]) 04/10/2018  . Osteomyelitis of left foot ([place]) 04/03/2018  . Postlaminectomy syndrome 04/01/2017  . Lumbar stenosis 03/09/2017  . Fibromyalgia 09/15/2016  . Risk for falls 06/25/2016  . OSA on CPAP 11/07/2015  . Bilateral edema of lower extremity 10/12/2015  . S/P removal of parathyroid gland ([place]) 12/21/2013  . History of hyperparathyroidism 11/22/2013  . History of osteomyelitis 10/25/2013  . Chronic low  back pain 05/12/2012  . Narcotic dependence ([place]) 05/12/2012  . History of hepatitis C 04/19/2010  . Essential hypertension 04/19/2010  . Dry eye syndrome 11/20/2008  . MDD (major depressive disorder) 11/20/2008  . ADD (attention deficit disorder) 12/01/2006   Past Medical History:  Diagnosis Date  . ADHD, adult residual type    takes Ritalin daily  . Anxiety   . Arthritis   . Bipolar disorder ([place])    "a touch"  . Cutaneous abscess of left foot 04/09/2018  . Depression    takes Lexapro daily  . Hepatitis C    resolved with treatment 2019. released from hepatology  . Hypertension    takes Lisinopril,Metoprolol,and Amlodipine daily  . Insomnia     takes Melatonin nightly  . Left foot pain 04/09/2018  . Muscle spasm    takes Flexeril daily  . Neuropathy involving both lower extremities    takes Gabapentin daily  . Open wound of left foot 04/03/2018  . Pneumonia   . Septic arthritis of left foot ([place]) 04/03/2018    Family History  Problem Relation Age of Onset  . Hypertension Mother   . Hypertension Father   . Diabetes type II Neg Hx     Past Surgical History:  Procedure Laterality Date  . AMPUTATION Left 10/21/2013   Procedure: LEFT FOOT SECOND RAY AMPUTATION ;  Surgeon: [name], MD;  Location: [facility];  Service: Orthopedics;  Laterality: Left;  . AMPUTATION Left 04/10/2018   Procedure: LEFT TRANSMETATARSAL AMPUTATION;  Surgeon: [name], MD;  Location: [facility];  Service: Orthopedics;  Laterality: Left;  . BACK SURGERY  2006   Disc  . COLONOSCOPY    . PARATHYROIDECTOMY N/A 12/21/2013   Procedure: PARATHYROIDECTOMY;  Surgeon: Ralene Ok, MD;  Location: [facility];  Service: General;  Laterality: N/A;  . SINUS EXPLORATION  2005/2007  . TEE WITHOUT CARDIOVERSION N/A 10/24/2013   Procedure: TRANSESOPHAGEAL ECHOCARDIOGRAM (TEE);  Surgeon: Fay Records, MD;  Location[facility] ENDOSCOPY;  Service: Cardiovascular;  Laterality: N/A;   Social History   Occupational History  . Occupation: retired     Fish farm manager: [employer]  Tobacco Use  . Smoking status: Former Smoker    Years: 21.00    Types: Cigarettes, Cigars    Quit date: 10/20/1990    Years since quitting: 27.7  . Smokeless tobacco: Never Used  Substance and Sexual Activity  . Alcohol use: No    Alcohol/week: 0.0 standard drinks    Frequency: Never  . Drug use: No  . Sexual activity: Never

## 2018-08-09 ENCOUNTER — Other Ambulatory Visit: Payer: Self-pay | Admitting: *Deleted

## 2018-08-09 DIAGNOSIS — I10 Essential (primary) hypertension: Secondary | ICD-10-CM

## 2018-08-09 MED ORDER — LISINOPRIL 40 MG PO TABS: 40.0000 mg | ORAL_TABLET | Freq: Every day | ORAL | 3 refills | Status: DC

## 2018-08-09 MED ORDER — AMLODIPINE BESYLATE 10 MG PO TABS: 10.0000 mg | ORAL_TABLET | Freq: Every day | ORAL | 3 refills | Status: DC

## 2018-09-07 ENCOUNTER — Encounter: Payer: Medicare HMO | Admitting: Family Medicine

## 2018-09-07 DIAGNOSIS — G8929 Other chronic pain: Secondary | ICD-10-CM | POA: Diagnosis not present

## 2018-09-07 DIAGNOSIS — G894 Chronic pain syndrome: Secondary | ICD-10-CM | POA: Diagnosis not present

## 2018-09-07 DIAGNOSIS — M533 Sacrococcygeal disorders, not elsewhere classified: Secondary | ICD-10-CM | POA: Diagnosis not present

## 2018-09-07 DIAGNOSIS — M961 Postlaminectomy syndrome, not elsewhere classified: Secondary | ICD-10-CM | POA: Diagnosis not present

## 2018-09-30 ENCOUNTER — Encounter: Payer: Self-pay | Admitting: Family Medicine

## 2018-09-30 ENCOUNTER — Other Ambulatory Visit: Payer: Self-pay

## 2018-09-30 ENCOUNTER — Ambulatory Visit (INDEPENDENT_AMBULATORY_CARE_PROVIDER_SITE_OTHER): Payer: Medicare HMO | Admitting: Family Medicine

## 2018-09-30 VITALS — BP 132/76 | HR 93 | Temp 97.3°F | Resp 16 | Ht 72.0 in | Wt 213.8 lb

## 2018-09-30 DIAGNOSIS — Z23 Encounter for immunization: Secondary | ICD-10-CM

## 2018-09-30 DIAGNOSIS — I10 Essential (primary) hypertension: Secondary | ICD-10-CM

## 2018-09-30 DIAGNOSIS — F3341 Major depressive disorder, recurrent, in partial remission: Secondary | ICD-10-CM | POA: Diagnosis not present

## 2018-09-30 DIAGNOSIS — R251 Tremor, unspecified: Secondary | ICD-10-CM | POA: Diagnosis not present

## 2018-09-30 DIAGNOSIS — G4733 Obstructive sleep apnea (adult) (pediatric): Secondary | ICD-10-CM

## 2018-09-30 DIAGNOSIS — M545 Low back pain: Secondary | ICD-10-CM

## 2018-09-30 DIAGNOSIS — M797 Fibromyalgia: Secondary | ICD-10-CM | POA: Diagnosis not present

## 2018-09-30 DIAGNOSIS — G8929 Other chronic pain: Secondary | ICD-10-CM | POA: Diagnosis not present

## 2018-09-30 DIAGNOSIS — Z0000 Encounter for general adult medical examination without abnormal findings: Secondary | ICD-10-CM

## 2018-09-30 DIAGNOSIS — Z9989 Dependence on other enabling machines and devices: Secondary | ICD-10-CM

## 2018-09-30 LAB — COMPREHENSIVE METABOLIC PANEL
ALT: 27 U/L (ref 0–53)
AST: 28 U/L (ref 0–37)
Albumin: 4 g/dL (ref 3.5–5.2)
Alkaline Phosphatase: 100 U/L (ref 39–117)
BUN: 18 mg/dL (ref 6–23)
CO2: 25 mEq/L (ref 19–32)
Calcium: 9.3 mg/dL (ref 8.4–10.5)
Chloride: 100 mEq/L (ref 96–112)
Creatinine, Ser: 1 mg/dL (ref 0.40–1.50)
GFR: 74.77 mL/min (ref >60.00)
Glucose, Bld: 91 mg/dL (ref 70–99)
Potassium: 4.1 mEq/L (ref 3.5–5.1)
Sodium: 136 mEq/L (ref 135–145)
Total Bilirubin: 0.6 mg/dL (ref 0.2–1.2)
Total Protein: 7.8 g/dL (ref 6.0–8.3)

## 2018-09-30 LAB — LIPID PANEL
Cholesterol: 153 mg/dL (ref 0–200)
HDL: 35.7 mg/dL — ABNORMAL LOW (ref >39.00)
LDL Cholesterol: 95 mg/dL (ref 0–99)
NonHDL: 117.33
Total CHOL/HDL Ratio: 4
Triglycerides: 114 mg/dL (ref 0.0–149.0)
VLDL: 22.8 mg/dL (ref 0.0–40.0)

## 2018-09-30 LAB — CBC WITH DIFFERENTIAL/PLATELET
Basophils Absolute: 0 10*3/uL (ref 0.0–0.1)
Basophils Relative: 0.8 % (ref 0.0–3.0)
Eosinophils Absolute: 0.1 10*3/uL (ref 0.0–0.7)
Eosinophils Relative: 2 % (ref 0.0–5.0)
HCT: 48.9 % (ref 39.0–52.0)
Hemoglobin: 15.7 g/dL (ref 13.0–17.0)
Lymphocytes Relative: 15.8 % (ref 12.0–46.0)
Lymphs Abs: 0.9 10*3/uL (ref 0.7–4.0)
MCHC: 32.1 g/dL (ref 30.0–36.0)
MCV: 82.9 fl (ref 78.0–100.0)
Monocytes Absolute: 0.6 10*3/uL (ref 0.1–1.0)
Monocytes Relative: 10.3 % (ref 3.0–12.0)
Neutro Abs: 4.2 10*3/uL (ref 1.4–7.7)
Neutrophils Relative %: 71.1 % (ref 43.0–77.0)
Platelets: 198 10*3/uL (ref 150.0–400.0)
RBC: 5.9 Mil/uL — ABNORMAL HIGH (ref 4.22–5.81)
RDW: 15.4 % (ref 11.5–15.5)
WBC: 5.8 10*3/uL (ref 4.0–10.5)

## 2018-09-30 MED ORDER — CARVEDILOL 12.5 MG PO TABS: 12.5000 mg | ORAL_TABLET | Freq: Two times a day (BID) | ORAL | 3 refills | Status: AC

## 2018-09-30 MED ORDER — MELOXICAM 15 MG PO TABS: 15.0000 mg | ORAL_TABLET | Freq: Every day | ORAL | 3 refills | Status: DC | PRN

## 2018-09-30 NOTE — Progress Notes (Signed)
Subjective  Chief Complaint  Patient presents with  . Annual Exam    Fasting  . Hypertension    HPI: William Solis is a 68 y.o. male who presents to [facility] today for a Male Wellness Visit. He also has the concerns and/or needs as listed above in the chief complaint. These will be addressed in addition to the Health Maintenance Visit.   Wellness Visit: annual visit with health maintenance review and exam    HM: flu and pneumovax due. Fasting for labs. Diet and activity are stable although hungry and eating more,  In part to covid stay at home restrictions. Weight is stable.  Lifestyle: Body mass index is 29 kg/m. Wt Readings from Last 3 Encounters:  09/30/18 213 lb 12.8 oz (97 kg)  07/08/18 215 lb (97.5 kg)  06/29/18 215 lb (97.5 kg)     Chronic disease management visit and/or acute problem visit:  Chronic pain - flared over the last month. Tried to stop gabapentin and flexeril but since has restarted. Managed by specialist. No new sxs.   Depression is stable on celexa  HTN: Feeling well. Taking medications w/o adverse effects. No symptoms of CHF, angina; no palpitations, sob, cp or lower extremity edema. Compliant with meds.   Using cpap. Sleeps well.   Patient Active Problem List   Diagnosis Date Noted  . Postlaminectomy syndrome 04/01/2017    Priority: High  . OSA on CPAP 11/07/2015    Priority: High  . History of osteomyelitis 10/25/2013    Priority: High  . Chronic low back pain 05/12/2012    Priority: High  . Narcotic dependence ([place]) 05/12/2012    Priority: High  . History of hepatitis C 04/19/2010    Priority: High  . Essential hypertension 04/19/2010    Priority: High  . MDD (major depressive disorder) 11/20/2008    Priority: High  . ADD (attention deficit disorder) 12/01/2006    Priority: High  . Lumbar stenosis 03/09/2017    Priority: Medium  . Fibromyalgia 09/15/2016    Priority: Medium  . Risk for falls  06/25/2016    Priority: Medium  . Bilateral edema of lower extremity 10/12/2015    Priority: Medium  . S/P removal of parathyroid gland ([place]) 12/21/2013    Priority: Low  . History of hyperparathyroidism 11/22/2013    Priority: Low  . Dry eye syndrome 11/20/2008    Priority: Low  . Tremor 09/30/2018  . S/P transmetatarsal amputation of foot, left ([place]) 04/10/2018  . Osteomyelitis of left foot ([place]) 04/03/2018   Health Maintenance  Topic Date Due  . PNA vac Low Risk Adult (2 of 2 - PPSV23) 05/21/2018  . INFLUENZA VACCINE  08/21/2018  . COLONOSCOPY  08/12/2023  . TETANUS/TDAP  03/10/2027  . Hepatitis C Screening  Completed  . HIV Screening  Completed   Immunization History  Administered Date(s) Administered  . Fluad Quad(high Dose 65+) 09/30/2018  . Hepatitis B 09/09/2013, 10/12/2013, 03/21/2014  . Hepatitis B, adult 09/09/2013, 10/12/2013, 03/21/2014  . Hepatitis B, ped/adol 09/09/2013, 10/12/2013, 03/21/2014  . Influenza, High Dose Seasonal PF 11/11/2012, 10/05/2014, 10/12/2015  . Influenza, Seasonal, Injecte, Preservative Fre 11/11/2012, 10/05/2014, 10/12/2015  . Influenza,inj,Quad PF,6+ Mos 11/11/2012, 10/05/2014, 12/05/2016, 09/12/2017  . Influenza-Unspecified 11/11/2012, 10/05/2014, 10/12/2015, 09/12/2017  . Pneumococcal Conjugate-13 05/23/2016  . Pneumococcal Polysaccharide-23 05/20/2013, 09/30/2018  . Td 03/09/2017  . Tdap 01/21/2007  . Zoster 11/11/2012   We updated and reviewed the patient's past history in detail and  it is documented below. Allergies: Patient has No Known Allergies. Past Medical History  has a past medical history of ADHD, adult residual type, Anxiety, Arthritis, Bipolar disorder ([place]), Cutaneous abscess of left foot (04/09/2018), Depression, Hepatitis C, Hypertension, Insomnia, Left foot pain (04/09/2018), Muscle spasm, Neuropathy involving both lower extremities, Open wound of left foot (04/03/2018), Pneumonia, and Septic arthritis of left foot  ([place]) (04/03/2018). Past Surgical History Patient  has a past surgical history that includes Back surgery (2006); Sinus exploration (2005/2007); Amputation (Left, 10/21/2013); TEE without cardioversion (N/A, 10/24/2013); Parathyroidectomy (N/A, 12/21/2013); Colonoscopy; and Amputation (Left, 04/10/2018). Social History Patient  reports that he quit smoking about 27 years ago. His smoking use included cigarettes and cigars. He quit after 21.00 years of use. He has never used smokeless tobacco. He reports that he does not drink alcohol or use drugs. Family History family history includes Hypertension in his father and mother. Review of Systems: Constitutional: negative for fever or malaise Ophthalmic: negative for photophobia, double vision or loss of vision Cardiovascular: negative for chest pain, dyspnea on exertion, or new LE swelling Respiratory: negative for SOB or persistent cough Gastrointestinal: negative for abdominal pain, change in bowel habits or melena Genitourinary: negative for dysuria or gross hematuria Musculoskeletal: negative for new gait disturbance or muscular weakness; no falls s/p partial foot amputation on left Integumentary: negative for new or persistent rashes Neurological: negative for TIA or stroke symptoms Psychiatric: negative for SI or delusions Allergic/Immunologic: negative for hives  Patient Care Team    Relationship Specialty Notifications Start End  Leamon Arnt, MD PCP - General Family Medicine  11/30/13   [name], MD Consulting Physician Orthopedic Surgery  10/22/13   Lauraine Rinne, MD Consulting Physician Neurology  03/09/17   Dionne Milo, MD Consulting Physician Pulmonary Disease  03/09/17   Roosevelt Locks, [place]  Nurse Practitioner  03/09/17    Comment: hepatology   Objective  Vitals: BP 132/76   Pulse 93   Temp (!) 97.3 F (36.3 C) (Tympanic)   Resp 16   Ht 6' (1.829 m)   Wt 213 lb 12.8 oz (97 kg)   SpO2 96%   BMI 29.00 kg/m  General:   Well developed, well nourished, no acute distress  Psych:  Alert and orientedx3,normal mood and affect HEENT:  Normocephalic, atraumatic, non-icteric sclera, PERRL, oropharynx is clear without mass or exudate, supple neck without adenopathy, mass or thyromegaly Cardiovascular:  Normal S1, S2, RRR without gallop, rub or murmur, nondisplaced PMI,  Respiratory:  Good breath sounds bilaterally, CTAB with normal respiratory effort Gastrointestinal: normal bowel sounds, soft, non-tender, no noted masses. No HSM MSK: no contusions. Joints are without erythema or swelling. Spine and CVA region are nontender Skin:  Warm, no rashes or suspicious lesions noted Neurologic:    Mental status is normal. CN 2-11 are normal. Gross motor and sensory exams are normal. Stable gait, + essential tremor bilateral hands. No resting tremor GU: No inguinal hernias or adenopathy are appreciated bilaterally   Assessment  1. Annual physical exam   2. Essential hypertension   3. Chronic bilateral low back pain without sciatica   4. OSA on CPAP   5. Recurrent major depressive disorder, in partial remission ([place])   6. Fibromyalgia   7. Tremor   8. Need for immunization against influenza   9. Need for pneumococcal vaccination      Plan  Male Wellness Visit:  Age appropriate Health Maintenance and Prevention measures were discussed with patient. Included topics are  cancer screening recommendations, ways to keep healthy (see AVS) including dietary and exercise recommendations, regular eye and dental care, use of seat belts, and avoidance of moderate alcohol use and tobacco use.  crc screen up to date  BMI: discussed patient's BMI and encouraged positive lifestyle modifications to help get to or maintain a target BMI.  HM needs and immunizations were addressed and ordered. See below for orders. See HM and immunization section for updates. Flu and pneumovax today  Routine labs and screening tests ordered including cmp,  cbc and lipids where appropriate.  Discussed recommendations regarding Vit D and calcium supplementation (see AVS)  Due initial AWV   Chronic disease f/u and/or acute problem visit: (deemed necessary to be done in addition to the wellness visit):  Chronic problems are mostly well controlled. Refilled meds  Monitor mood on celexa  bp is well controlled. Recheck lytes and renal fxn. No sxs of cad.   Chronic pain on narcotics per specialists.   Follow up: Return in about 6 months (around 03/30/2019) for follow up Hypertension, recheck.   Commons side effects, risks, benefits, and alternatives for medications and treatment plan prescribed today were discussed, and the patient expressed understanding of the given instructions. Patient is instructed to call or message via MyChart if he/she has any questions or concerns regarding our treatment plan. No barriers to understanding were identified. We discussed Red Flag symptoms and signs in detail. Patient expressed understanding regarding what to do in case of urgent or emergency type symptoms.   Medication list was reconciled, printed and provided to the patient in AVS. Patient instructions and summary information was reviewed with the patient as documented in the AVS. This note was prepared with assistance of Dragon voice recognition software. Occasional wrong-word or sound-a-like substitutions may have occurred due to the inherent limitations of voice recognition software  Orders Placed This Encounter  Procedures  . Flu Vaccine QUAD High Dose(Fluad)  . Pneumococcal polysaccharide vaccine 23-valent greater than or equal to 2yo subcutaneous/IM  . Comprehensive metabolic panel  . CBC with Differential/Platelet  . Lipid panel   Meds ordered this encounter  Medications  . carvedilol (COREG) 12.5 MG tablet    Sig: Take 1 tablet (12.5 mg total) by mouth 2 (two) times daily.    Dispense:  180 tablet    Refill:  3  . meloxicam (MOBIC) 15 MG  tablet    Sig: Take 1 tablet (15 mg total) by mouth daily as needed (arthritis pain (hands)).    Dispense:  90 tablet    Refill:  3

## 2018-09-30 NOTE — Patient Instructions (Addendum)
Please return in 6 months for your annual complete physical; please come fasting.  Medicare recommends an Annual Wellness Visit for all patients. Please schedule this to be done with our Nurse Educator, Loma Sousa. This is an informative "talk" visit; it's goals are to ensure that your health care needs are being met and to give you education regarding avoiding falls, ensuring you are not suffering from depression or problems with memory or thinking, and to educate you on Advance Care Planning. It helps me take good care of you!  Today you were given your flu and pneumovax vaccinations.   If you have any questions or concerns, please don't hesitate to send me a message via MyChart or call the office at [phone]. Thank you for visiting with William Solis today! It's our pleasure caring for you.   Preventive Care 15 Years and Older, Male Preventive care refers to lifestyle choices and visits with your health care provider that can promote health and wellness. This includes:  A yearly physical exam. This is also called an annual well check.  Regular dental and eye exams.  Immunizations.  Screening for certain conditions.  Healthy lifestyle choices, such as diet and exercise. What can I expect for my preventive care visit? Physical exam Your health care provider will check:  Height and weight. These may be used to calculate body mass index (BMI), which is a measurement that tells if you are at a healthy weight.  Heart rate and blood pressure.  Your skin for abnormal spots. Counseling Your health care provider may ask you questions about:  Alcohol, tobacco, and drug use.  Emotional well-being.  Home and relationship well-being.  Sexual activity.  Eating habits.  History of falls.  Memory and ability to understand (cognition).  Work and work Statistician. What immunizations do I need?  Influenza (flu) vaccine  This is recommended every year. Tetanus, diphtheria, and pertussis  (Tdap) vaccine  You may need a Td booster every 10 years. Varicella (chickenpox) vaccine  You may need this vaccine if you have not already been vaccinated. Zoster (shingles) vaccine  You may need this after age 97. Pneumococcal conjugate (PCV13) vaccine  One dose is recommended after age 24. Pneumococcal polysaccharide (PPSV23) vaccine  One dose is recommended after age 36. Measles, mumps, and rubella (MMR) vaccine  You may need at least one dose of MMR if you were born in 1957 or later. You may also need a second dose. Meningococcal conjugate (MenACWY) vaccine  You may need this if you have certain conditions. Hepatitis A vaccine  You may need this if you have certain conditions or if you travel or work in places where you may be exposed to hepatitis A. Hepatitis B vaccine  You may need this if you have certain conditions or if you travel or work in places where you may be exposed to hepatitis B. Haemophilus influenzae type b (Hib) vaccine  You may need this if you have certain conditions. You may receive vaccines as individual doses or as more than one vaccine together in one shot (combination vaccines). Talk with your health care provider about the risks and benefits of combination vaccines. What tests do I need? Blood tests  Lipid and cholesterol levels. These may be checked every 5 years, or more frequently depending on your overall health.  Hepatitis C test.  Hepatitis B test. Screening  Lung cancer screening. You may have this screening every year starting at age 23 if you have a 30-pack-year history of smoking and  currently smoke or have quit within the past 15 years.  Colorectal cancer screening. All adults should have this screening starting at age 50 and continuing until age 58. Your health care provider may recommend screening at age 69 if you are at increased risk. You will have tests every 1-10 years, depending on your results and the type of screening test.   Prostate cancer screening. Recommendations will vary depending on your family history and other risks.  Diabetes screening. This is done by checking your blood sugar (glucose) after you have not eaten for a while (fasting). You may have this done every 1-3 years.  Abdominal aortic aneurysm (AAA) screening. You may need this if you are a current or former smoker.  Sexually transmitted disease (STD) testing. Follow these instructions at home: Eating and drinking  Eat a diet that includes fresh fruits and vegetables, whole grains, lean protein, and low-fat dairy products. Limit your intake of foods with high amounts of sugar, saturated fats, and salt.  Take vitamin and mineral supplements as recommended by your health care provider.  Do not drink alcohol if your health care provider tells you not to drink.  If you drink alcohol: ? Limit how much you have to 0-2 drinks a day. ? Be aware of how much alcohol is in your drink. In the [place] one drink equals one 12 oz bottle of beer (355 mL), one 5 oz glass of wine (148 mL), or one 1 oz glass of hard liquor (44 mL). Lifestyle  Take daily care of your teeth and gums.  Stay active. Exercise for at least 30 minutes on 5 or more days each week.  Do not use any products that contain nicotine or tobacco, such as cigarettes, e-cigarettes, and chewing tobacco. If you need help quitting, ask your health care provider.  If you are sexually active, practice safe sex. Use a condom or other form of protection to prevent STIs (sexually transmitted infections).  Talk with your health care provider about taking a low-dose aspirin or statin. What's next?  Visit your health care provider once a year for a well check visit.  Ask your health care provider how often you should have your eyes and teeth checked.  Stay up to date on all vaccines. This information is not intended to replace advice given to you by your health care provider. Make sure you  discuss any questions you have with your health care provider. Document Released: 02/02/2015 Document Revised: 12/31/2017 Document Reviewed: 12/31/2017 Elsevier Patient Education  2020 [employer].

## 2018-11-05 ENCOUNTER — Other Ambulatory Visit: Payer: Self-pay | Admitting: Family Medicine

## 2018-11-05 MED ORDER — CITALOPRAM HYDROBROMIDE 20 MG PO TABS: 20.0000 mg | ORAL_TABLET | Freq: Every day | ORAL | 3 refills | Status: DC

## 2018-11-05 NOTE — Telephone Encounter (Signed)
See below

## 2018-11-05 NOTE — Telephone Encounter (Signed)
Refilled to [facility]

## 2018-11-05 NOTE — Telephone Encounter (Signed)
rx refill citalopram (CELEXA) 20 MG tablet  [facility] - [place], [place] - [phone] ([phone] (Fax)

## 2018-12-08 DIAGNOSIS — M48061 Spinal stenosis, lumbar region without neurogenic claudication: Secondary | ICD-10-CM | POA: Diagnosis not present

## 2018-12-08 DIAGNOSIS — M533 Sacrococcygeal disorders, not elsewhere classified: Secondary | ICD-10-CM | POA: Diagnosis not present

## 2018-12-08 DIAGNOSIS — G8929 Other chronic pain: Secondary | ICD-10-CM | POA: Diagnosis not present

## 2018-12-08 DIAGNOSIS — M961 Postlaminectomy syndrome, not elsewhere classified: Secondary | ICD-10-CM | POA: Diagnosis not present

## 2018-12-08 DIAGNOSIS — G894 Chronic pain syndrome: Secondary | ICD-10-CM | POA: Diagnosis not present

## 2018-12-08 DIAGNOSIS — Z5181 Encounter for therapeutic drug level monitoring: Secondary | ICD-10-CM | POA: Diagnosis not present

## 2018-12-10 DIAGNOSIS — Z5181 Encounter for therapeutic drug level monitoring: Secondary | ICD-10-CM | POA: Diagnosis not present

## 2018-12-20 DIAGNOSIS — M5135 Other intervertebral disc degeneration, thoracolumbar region: Secondary | ICD-10-CM | POA: Diagnosis not present

## 2018-12-20 DIAGNOSIS — M9901 Segmental and somatic dysfunction of cervical region: Secondary | ICD-10-CM | POA: Diagnosis not present

## 2018-12-20 DIAGNOSIS — M9902 Segmental and somatic dysfunction of thoracic region: Secondary | ICD-10-CM | POA: Diagnosis not present

## 2018-12-20 DIAGNOSIS — M9903 Segmental and somatic dysfunction of lumbar region: Secondary | ICD-10-CM | POA: Diagnosis not present

## 2018-12-20 DIAGNOSIS — M5032 Other cervical disc degeneration, mid-cervical region, unspecified level: Secondary | ICD-10-CM | POA: Diagnosis not present

## 2018-12-20 DIAGNOSIS — M5136 Other intervertebral disc degeneration, lumbar region: Secondary | ICD-10-CM | POA: Diagnosis not present

## 2018-12-20 DIAGNOSIS — M9904 Segmental and somatic dysfunction of sacral region: Secondary | ICD-10-CM | POA: Diagnosis not present

## 2018-12-20 DIAGNOSIS — M5137 Other intervertebral disc degeneration, lumbosacral region: Secondary | ICD-10-CM | POA: Diagnosis not present

## 2018-12-27 DIAGNOSIS — M5135 Other intervertebral disc degeneration, thoracolumbar region: Secondary | ICD-10-CM | POA: Diagnosis not present

## 2018-12-27 DIAGNOSIS — M5032 Other cervical disc degeneration, mid-cervical region, unspecified level: Secondary | ICD-10-CM | POA: Diagnosis not present

## 2018-12-27 DIAGNOSIS — M9904 Segmental and somatic dysfunction of sacral region: Secondary | ICD-10-CM | POA: Diagnosis not present

## 2018-12-27 DIAGNOSIS — M9901 Segmental and somatic dysfunction of cervical region: Secondary | ICD-10-CM | POA: Diagnosis not present

## 2018-12-27 DIAGNOSIS — M5137 Other intervertebral disc degeneration, lumbosacral region: Secondary | ICD-10-CM | POA: Diagnosis not present

## 2018-12-27 DIAGNOSIS — M5136 Other intervertebral disc degeneration, lumbar region: Secondary | ICD-10-CM | POA: Diagnosis not present

## 2018-12-27 DIAGNOSIS — M9902 Segmental and somatic dysfunction of thoracic region: Secondary | ICD-10-CM | POA: Diagnosis not present

## 2018-12-27 DIAGNOSIS — M9903 Segmental and somatic dysfunction of lumbar region: Secondary | ICD-10-CM | POA: Diagnosis not present

## 2018-12-30 DIAGNOSIS — H35363 Drusen (degenerative) of macula, bilateral: Secondary | ICD-10-CM | POA: Diagnosis not present

## 2018-12-30 DIAGNOSIS — H35033 Hypertensive retinopathy, bilateral: Secondary | ICD-10-CM | POA: Diagnosis not present

## 2018-12-30 DIAGNOSIS — H2513 Age-related nuclear cataract, bilateral: Secondary | ICD-10-CM | POA: Diagnosis not present

## 2018-12-30 DIAGNOSIS — H25013 Cortical age-related cataract, bilateral: Secondary | ICD-10-CM | POA: Diagnosis not present

## 2019-01-10 ENCOUNTER — Ambulatory Visit (INDEPENDENT_AMBULATORY_CARE_PROVIDER_SITE_OTHER): Payer: Medicare HMO

## 2019-01-10 ENCOUNTER — Other Ambulatory Visit: Payer: Self-pay

## 2019-01-10 DIAGNOSIS — Z0000 Encounter for general adult medical examination without abnormal findings: Secondary | ICD-10-CM | POA: Diagnosis not present

## 2019-01-10 DIAGNOSIS — M5135 Other intervertebral disc degeneration, thoracolumbar region: Secondary | ICD-10-CM | POA: Diagnosis not present

## 2019-01-10 DIAGNOSIS — M9903 Segmental and somatic dysfunction of lumbar region: Secondary | ICD-10-CM | POA: Diagnosis not present

## 2019-01-10 DIAGNOSIS — M9901 Segmental and somatic dysfunction of cervical region: Secondary | ICD-10-CM | POA: Diagnosis not present

## 2019-01-10 DIAGNOSIS — M5136 Other intervertebral disc degeneration, lumbar region: Secondary | ICD-10-CM | POA: Diagnosis not present

## 2019-01-10 DIAGNOSIS — M5032 Other cervical disc degeneration, mid-cervical region, unspecified level: Secondary | ICD-10-CM | POA: Diagnosis not present

## 2019-01-10 DIAGNOSIS — M9902 Segmental and somatic dysfunction of thoracic region: Secondary | ICD-10-CM | POA: Diagnosis not present

## 2019-01-10 DIAGNOSIS — M9904 Segmental and somatic dysfunction of sacral region: Secondary | ICD-10-CM | POA: Diagnosis not present

## 2019-01-10 DIAGNOSIS — M5137 Other intervertebral disc degeneration, lumbosacral region: Secondary | ICD-10-CM | POA: Diagnosis not present

## 2019-01-10 NOTE — Patient Instructions (Signed)
[name] , Thank you for taking time to come for your Medicare Wellness Visit. I appreciate your ongoing commitment to your health goals. Please review the following plan we discussed and let me know if I can assist you in the future.   Screening recommendations/referrals: Colorectal Screening: up to date; last 08/11/13   Vision and Dental Exams: Recommended annual ophthalmology exams for early detection of glaucoma and other disorders of the eye Recommended annual dental exams for proper oral hygiene  Vaccinations: Influenza vaccine: completed 09/30/18 Pneumococcal vaccine: up to date; last 09/30/18 Tdap vaccine: up to date; last 03/09/17  Shingles vaccine: Please call your insurance company to determine your out of pocket expense for the Shingrix vaccine. You may receive this vaccine at your local pharmacy.  Advanced directives: Please bring a copy of your POA (Power of Attorney) and/or Living Will to your next appointment.  Goals: Recommend to drink at least 6-8 8oz glasses of water per day and consume a balanced diet rich in fresh fruits and vegetables.   Next appointment: Please schedule your Annual Wellness Visit with your Nurse Health Advisor in one year.  Preventive Care 33 Years and Older, Male Preventive care refers to lifestyle choices and visits with your health care provider that can promote health and wellness. What does preventive care include?  A yearly physical exam. This is also called an annual well check.  Dental exams once or twice a year.  Routine eye exams. Ask your health care provider how often you should have your eyes checked.  Personal lifestyle choices, including:  Daily care of your teeth and gums.  Regular physical activity.  Eating a healthy diet.  Avoiding tobacco and drug use.  Limiting alcohol use.  Practicing safe sex.  Taking low doses of aspirin every day if recommended by your health care provider..  Taking vitamin and mineral  supplements as recommended by your health care provider. What happens during an annual well check? The services and screenings done by your health care provider during your annual well check will depend on your age, overall health, lifestyle risk factors, and family history of disease. Counseling  Your health care provider may ask you questions about your:  Alcohol use.  Tobacco use.  Drug use.  Emotional well-being.  Home and relationship well-being.  Sexual activity.  Eating habits.  History of falls.  Memory and ability to understand (cognition).  Work and work Statistician. Screening  You may have the following tests or measurements:  Height, weight, and BMI.  Blood pressure.  Lipid and cholesterol levels. These may be checked every 5 years, or more frequently if you are over 47 years old.  Skin check.  Lung cancer screening. You may have this screening every year starting at age 17 if you have a 30-pack-year history of smoking and currently smoke or have quit within the past 15 years.  Fecal occult blood test (FOBT) of the stool. You may have this test every year starting at age 19.  Flexible sigmoidoscopy or colonoscopy. You may have a sigmoidoscopy every 5 years or a colonoscopy every 10 years starting at age 57.  Prostate cancer screening. Recommendations will vary depending on your family history and other risks.  Hepatitis C blood test.  Hepatitis B blood test.  Sexually transmitted disease (STD) testing.  Diabetes screening. This is done by checking your blood sugar (glucose) after you have not eaten for a while (fasting). You may have this done every 1-3 years.  Abdominal aortic  aneurysm (AAA) screening. You may need this if you are a current or former smoker.  Osteoporosis. You may be screened starting at age 73 if you are at high risk. Talk with your health care provider about your test results, treatment options, and if necessary, the need for more  tests. Vaccines  Your health care provider may recommend certain vaccines, such as:  Influenza vaccine. This is recommended every year.  Tetanus, diphtheria, and acellular pertussis (Tdap, Td) vaccine. You may need a Td booster every 10 years.  Zoster vaccine. You may need this after age 65.  Pneumococcal 13-valent conjugate (PCV13) vaccine. One dose is recommended after age 51.  Pneumococcal polysaccharide (PPSV23) vaccine. One dose is recommended after age 51. Talk to your health care provider about which screenings and vaccines you need and how often you need them. This information is not intended to replace advice given to you by your health care provider. Make sure you discuss any questions you have with your health care provider. Document Released: 02/02/2015 Document Revised: 09/26/2015 Document Reviewed: 11/07/2014 Elsevier Interactive Patient Education  2017 [place] Prevention in the Home Falls can cause injuries. They can happen to people of all ages. There are many things you can do to make your home safe and to help prevent falls. What can I do on the outside of my home?  Regularly fix the edges of walkways and driveways and fix any cracks.  Remove anything that might make you trip as you walk through a door, such as a raised step or threshold.  Trim any bushes or trees on the path to your home.  Use bright outdoor lighting.  Clear any walking paths of anything that might make someone trip, such as rocks or tools.  Regularly check to see if handrails are loose or broken. Make sure that both sides of any steps have handrails.  Any raised decks and porches should have guardrails on the edges.  Have any leaves, snow, or ice cleared regularly.  Use sand or salt on walking paths during winter.  Clean up any spills in your garage right away. This includes oil or grease spills. What can I do in the bathroom?  Use night lights.  Install grab bars by the  toilet and in the tub and shower. Do not use towel bars as grab bars.  Use non-skid mats or decals in the tub or shower.  If you need to sit down in the shower, use a plastic, non-slip stool.  Keep the floor dry. Clean up any water that spills on the floor as soon as it happens.  Remove soap buildup in the tub or shower regularly.  Attach bath mats securely with double-sided non-slip rug tape.  Do not have throw rugs and other things on the floor that can make you trip. What can I do in the bedroom?  Use night lights.  Make sure that you have a light by your bed that is easy to reach.  Do not use any sheets or blankets that are too big for your bed. They should not hang down onto the floor.  Have a firm chair that has side arms. You can use this for support while you get dressed.  Do not have throw rugs and other things on the floor that can make you trip. What can I do in the kitchen?  Clean up any spills right away.  Avoid walking on wet floors.  Keep items that you use a lot  in easy-to-reach places.  If you need to reach something above you, use a strong step stool that has a grab bar.  Keep electrical cords out of the way.  Do not use floor polish or wax that makes floors slippery. If you must use wax, use non-skid floor wax.  Do not have throw rugs and other things on the floor that can make you trip. What can I do with my stairs?  Do not leave any items on the stairs.  Make sure that there are handrails on both sides of the stairs and use them. Fix handrails that are broken or loose. Make sure that handrails are as long as the stairways.  Check any carpeting to make sure that it is firmly attached to the stairs. Fix any carpet that is loose or worn.  Avoid having throw rugs at the top or bottom of the stairs. If you do have throw rugs, attach them to the floor with carpet tape.  Make sure that you have a light switch at the top of the stairs and the bottom of  the stairs. If you do not have them, ask someone to add them for you. What else can I do to help prevent falls?  Wear shoes that:  Do not have high heels.  Have rubber bottoms.  Are comfortable and fit you well.  Are closed at the toe. Do not wear sandals.  If you use a stepladder:  Make sure that it is fully opened. Do not climb a closed stepladder.  Make sure that both sides of the stepladder are locked into place.  Ask someone to hold it for you, if possible.  Clearly mark and make sure that you can see:  Any grab bars or handrails.  First and last steps.  Where the edge of each step is.  Use tools that help you move around (mobility aids) if they are needed. These include:  Canes.  Walkers.  Scooters.  Crutches.  Turn on the lights when you go into a dark area. Replace any light bulbs as soon as they burn out.  Set up your furniture so you have a clear path. Avoid moving your furniture around.  If any of your floors are uneven, fix them.  If there are any pets around you, be aware of where they are.  Review your medicines with your doctor. Some medicines can make you feel dizzy. This can increase your chance of falling. Ask your doctor what other things that you can do to help prevent falls. This information is not intended to replace advice given to you by your health care provider. Make sure you discuss any questions you have with your health care provider. Document Released: 11/02/2008 Document Revised: 06/14/2015 Document Reviewed: 02/10/2014 Elsevier Interactive Patient Education  2017 [employer].

## 2019-01-10 NOTE — Progress Notes (Signed)
This visit is being conducted via phone call due to the COVID-19 pandemic. This patient has given me verbal consent via phone to conduct this visit, patient states they are participating from their home address. Some vital signs may be absent or patient reported.   Patient identification: identified by name, DOB, and current address.  Location provider: [facility], Office Persons participating in the virtual visit: Denman George LPN, patient, and Dr. Billey Chang     Subjective:   William Solis is a 68 y.o. male who presents for an Initial Medicare Annual Wellness Visit.  Review of Systems   Cardiac Risk Factors include: male gender;hypertension;advanced age (>93men, >65 women)   Objective:    There were no vitals filed for this visit. There is no height or weight on file to calculate BMI.  Advanced Directives 01/10/2019 04/10/2018 02/16/2015 12/16/2013 11/15/2013 10/19/2013 10/19/2013  Does Patient Have a Medical Advance Directive? Yes Yes Yes Yes No Yes No  Type of Advance Directive Living will;Healthcare Power of [place];Living will [place];Living will [place];Living will - Living will;Healthcare Power of Attorney -  Does patient want to make changes to medical advance directive? No - Patient declined No - Patient declined - - - No - Patient declined -  Copy of [place] in Chart? No - copy requested Yes - validated most recent copy scanned in chart (See row information) - Yes - No - copy requested -  Would patient like information on creating a medical advance directive? - - - - No - patient declined information - -    Current Medications (verified) Outpatient Encounter Medications as of 01/10/2019  Medication Sig  . amLODipine (NORVASC) 10 MG tablet Take 1 tablet (10 mg total) by mouth daily.  [place] EX Apply 1 application topically 3 (three) times daily as needed (arthritis pain).    . Ascorbic Acid (VITAMIN C) 1000 MG tablet Take 1,500 mg by mouth daily.   Marland Kitchen aspirin 81 MG chewable tablet Chew by mouth daily. Every 2 days  . carvedilol (COREG) 12.5 MG tablet Take 1 tablet (12.5 mg total) by mouth 2 (two) times daily.  . cholecalciferol (VITAMIN D3) 25 MCG (1000 UT) tablet Take 1,000 Units by mouth daily.  . citalopram (CELEXA) 20 MG tablet Take 1 tablet (20 mg total) by mouth daily.  . Cyanocobalamin (VITAMIN B-12 PO) Take 1 tablet by mouth every 3 (three) days.  . cyclobenzaprine (FLEXERIL) 10 MG tablet Take 10 mg by mouth 3 (three) times daily as needed for muscle spasms.  Marland Kitchen gabapentin (NEURONTIN) 300 MG capsule Take 1 capsule by mouth 3 (three) times daily as needed.  . Glucosamine HCl 1500 MG TABS Take 1,500 mg by mouth daily.  . hydrochlorothiazide (HYDRODIURIL) 25 MG tablet Take 1 tablet (25 mg total) by mouth daily.  Marland Kitchen lidocaine (LIDODERM) 5 % Place 1 patch onto the skin daily as needed (pain). Remove & Discard patch within 12 hours or as directed by MD  . lisinopril (ZESTRIL) 40 MG tablet Take 1 tablet (40 mg total) by mouth daily.  . Melatonin 3 MG TABS Take 3 mg by mouth at bedtime.   . meloxicam (MOBIC) 15 MG tablet Take 1 tablet (15 mg total) by mouth daily as needed (arthritis pain (hands)).  . mometasone (ELOCON) 0.1 % cream Apply 1 application topically daily.  . Multiple Vitamins-Minerals (MULTIVITAMIN WITH MINERALS) tablet Take 1 tablet by mouth daily.  Marland Kitchen  oxyCODONE (OXY IR/ROXICODONE) 5 MG immediate release tablet Take 1-2 tablets (5-10 mg total) by mouth every 4 (four) hours as needed for moderate pain (pain score 4-6).  . Tetrahydrozoline HCl (VISINE OP) Place 1 drop into both eyes 3 (three) times daily as needed (dry eyes).   No facility-administered encounter medications on file as of 01/10/2019.    Allergies (verified) Patient has no known allergies.   History: Past Medical History:  Diagnosis Date  . ADHD, adult residual type    takes Ritalin  daily  . Anxiety   . Arthritis   . Bipolar disorder ([place])    "a touch"  . Cutaneous abscess of left foot 04/09/2018  . Depression    takes Lexapro daily  . Hepatitis C    resolved with treatment 2019. released from hepatology  . Hypertension    takes Lisinopril,Metoprolol,and Amlodipine daily  . Insomnia    takes Melatonin nightly  . Left foot pain 04/09/2018  . Muscle spasm    takes Flexeril daily  . Neuropathy involving both lower extremities    takes Gabapentin daily  . Open wound of left foot 04/03/2018  . Pneumonia   . Septic arthritis of left foot ([place]) 04/03/2018   Past Surgical History:  Procedure Laterality Date  . AMPUTATION Left 10/21/2013   Procedure: LEFT FOOT SECOND RAY AMPUTATION ;  Surgeon: [name], MD;  Location: [facility];  Service: Orthopedics;  Laterality: Left;  . AMPUTATION Left 04/10/2018   Procedure: LEFT TRANSMETATARSAL AMPUTATION;  Surgeon: [name], MD;  Location: [facility];  Service: Orthopedics;  Laterality: Left;  . BACK SURGERY  2006   Disc  . COLONOSCOPY    . PARATHYROIDECTOMY N/A 12/21/2013   Procedure: PARATHYROIDECTOMY;  Surgeon: Ralene Ok, MD;  Location: [facility];  Service: General;  Laterality: N/A;  . SINUS EXPLORATION  2005/2007  . TEE WITHOUT CARDIOVERSION N/A 10/24/2013   Procedure: TRANSESOPHAGEAL ECHOCARDIOGRAM (TEE);  Surgeon: Fay Records, MD;  Location[facility]COPY;  Service: Cardiovascular;  Laterality: N/A;   Family History  Problem Relation Age of Onset  . Hypertension Mother   . Hypertension Father   . Diabetes type II Neg Hx    Social History   Socioeconomic History  . Marital status: Married    Spouse name: Not on file  . Number of children: Not on file  . Years of education: Not on file  . Highest education level: Not on file  Occupational History  . Occupation: retired     Fish farm manager: [employer]  Tobacco Use  . Smoking status: Former Smoker    Years: 21.00    Types: Cigarettes, Cigars    Quit  date: 10/20/1990    Years since quitting: 28.2  . Smokeless tobacco: Never Used  Substance and Sexual Activity  . Alcohol use: No    Alcohol/week: 0.0 standard drinks  . Drug use: No  . Sexual activity: Never  Other Topics Concern  . Not on file  Social History Narrative   Vegetarian    1 dog    Social Determinants of Health   Financial Resource Strain:   . Difficulty of Paying Living Expenses: Not on file  Food Insecurity:   . Worried About Charity fundraiser in the Last Year: Not on file  . Ran Out of Food in the Last Year: Not on file  Transportation Needs:   . Lack of Transportation (Medical): Not on file  . Lack of Transportation (  Non-Medical): Not on file  Physical Activity:   . Days of Exercise per Week: Not on file  . Minutes of Exercise per Session: Not on file  Stress:   . Feeling of Stress : Not on file  Social Connections:   . Frequency of Communication with Friends and Family: Not on file  . Frequency of Social Gatherings with Friends and Family: Not on file  . Attends Religious Services: Not on file  . Active Member of Clubs or Organizations: Not on file  . Attends Archivist Meetings: Not on file  . Marital Status: Not on file   Tobacco Counseling Counseling given: Not Answered   Clinical Intake:  Pre-visit preparation completed: Yes  Pain : (followed by pain managment)  Diabetes: No  How often do you need to have someone help you when you read instructions, pamphlets, or other written materials from your doctor or pharmacy?: 1 - Never  Interpreter Needed?: No  Information entered by :: Denman George LPN  Activities of Daily Living In your present state of health, do you have any difficulty performing the following activities: 01/10/2019 09/30/2018  Hearing? N N  Vision? N Y  Difficulty concentrating or making decisions? N N  Walking or climbing stairs? N N  Dressing or bathing? N N  Doing errands, shopping? N N  Preparing Food  and eating ? N -  Using the Toilet? N -  In the past six months, have you accidently leaked urine? N -  Do you have problems with loss of bowel control? N -  Managing your Medications? N -  Managing your Finances? N -  Housekeeping or managing your Housekeeping? N -  Some recent data might be hidden     Immunizations and Health Maintenance Immunization History  Administered Date(s) Administered  . Fluad Quad(high Dose 65+) 09/30/2018  . Hepatitis B 09/09/2013, 10/12/2013, 03/21/2014  . Hepatitis B, adult 09/09/2013, 10/12/2013, 03/21/2014  . Hepatitis B, ped/adol 09/09/2013, 10/12/2013, 03/21/2014  . Influenza, High Dose Seasonal PF 11/11/2012, 10/05/2014, 10/12/2015  . Influenza, Seasonal, Injecte, Preservative Fre 11/11/2012, 10/05/2014, 10/12/2015  . Influenza,inj,Quad PF,6+ Mos 11/11/2012, 10/05/2014, 12/05/2016, 09/12/2017  . Influenza-Unspecified 11/11/2012, 10/05/2014, 10/12/2015, 09/12/2017  . Pneumococcal Conjugate-13 05/23/2016  . Pneumococcal Polysaccharide-23 05/20/2013, 09/30/2018  . Td 03/09/2017  . Tdap 01/21/2007  . Zoster 11/11/2012   There are no preventive care reminders to display for this patient.  Patient Care Team: Leamon Arnt, MD as PCP - General (Family Medicine) [name], MD as Consulting Physician (Orthopedic Surgery) Lauraine Rinne, MD as Consulting Physician (Neurology) Dionne Milo, MD as Consulting Physician (Pulmonary Disease) Roosevelt Locks, CRNP (Nurse Practitioner) Marjean Donna, PA-C as Physician Assistant (Pain Medicine)  Indicate any recent Medical Services you may have received from other than [facility] providers in the past year (date may be approximate).    Assessment:   This is a routine wellness examination for [place].  Hearing/Vision screen No exam data present  Dietary issues and exercise activities discussed: Current Exercise Habits: Home exercise routine, Type of exercise: walking, Time (Minutes): 45, Frequency  (Times/Week): 6, Weekly Exercise (Minutes/Week): 270, Intensity: Mild  Goals   None    Depression Screen PHQ 2/9 Scores 01/10/2019 09/30/2018 06/03/2018 06/03/2018  PHQ - 2 Score 0 2 0 0  PHQ- 9 Score - 6 1 -    Fall Risk Fall Risk  01/10/2019 09/30/2018 06/03/2018 04/09/2018 03/09/2017  Falls in the past year? 0 0 0 0 No  Number falls in past  yr: - 0 0 0 -  Injury with Fall? 0 0 0 0 -  Risk for fall due to : - - - Impaired balance/gait;Orthopedic patient -  Follow up Falls evaluation completed;Education provided;Falls prevention discussed Falls evaluation completed Falls evaluation completed Falls evaluation completed -    Is the patient's home free of loose throw rugs in walkways, pet beds, electrical cords, etc?   yes      Grab bars in the bathroom? yes      Handrails on the stairs?   yes      Adequate lighting?   yes  Cognitive Function: no cognitive concerns at this time  Cognitive Testing  Alert? Yes         Normal Appearance? N/a  Oriented to person? Yes           Place? Yes  Time? Yes  Recall of three objects? Yes  Can perform simple calculations? Yes  Displays appropriate judgment? Yes  Can read the correct time from a watch face? Yes   Screening Tests Health Maintenance  Topic Date Due  . COLONOSCOPY  08/12/2023  . TETANUS/TDAP  03/10/2027  . INFLUENZA VACCINE  Completed  . Hepatitis C Screening  Completed  . PNA vac Low Risk Adult  Completed    Qualifies for Shingles Vaccine? Discussed and patient will check with pharmacy for coverage.  Patient education handout provided   Cancer Screenings: Lung: Low Dose CT Chest recommended if Age 36-80 years, 30 pack-year currently smoking OR have quit w/in 15years. Patient does not qualify. Colorectal: colonoscopy 08/11/13      Plan:  I have personally reviewed and addressed the Medicare Annual Wellness questionnaire and have noted the following in the patient's chart:  A. Medical and social history B. Use of alcohol,  tobacco or illicit drugs  C. Current medications and supplements D. Functional ability and status E.  Nutritional status F.  Physical activity G. Advance directives H. List of other physicians I.  Hospitalizations, surgeries, and ER visits in previous 12 months J.  [place] such as hearing and vision if needed, cognitive and depression L. Referrals, records requested, and appointments- none   In addition, I have reviewed and discussed with patient certain preventive protocols, quality metrics, and best practice recommendations. A written personalized care plan for preventive services as well as general preventive health recommendations were provided to patient.   Signed,  Denman George, LPN  Nurse Health Advisor   Nurse Notes: no additional

## 2019-01-26 ENCOUNTER — Other Ambulatory Visit: Payer: Self-pay

## 2019-01-26 ENCOUNTER — Encounter: Payer: Self-pay | Admitting: Family Medicine

## 2019-01-26 ENCOUNTER — Ambulatory Visit (INDEPENDENT_AMBULATORY_CARE_PROVIDER_SITE_OTHER): Payer: Medicare HMO | Admitting: Family Medicine

## 2019-01-26 VITALS — BP 142/82 | HR 81 | Temp 98.4°F | Ht 72.0 in | Wt 214.4 lb

## 2019-01-26 DIAGNOSIS — R202 Paresthesia of skin: Secondary | ICD-10-CM | POA: Diagnosis not present

## 2019-01-26 NOTE — Patient Instructions (Signed)
Please follow up as scheduled for your next visit with me: 03/30/2019   Use OTC capsaicin cream and increase your dose of gabapentin to calm day the burning pain on your skin.   If you have any questions or concerns, please don't hesitate to send me a message via MyChart or call the office at [phone]. Thank you for visiting with [place] today! It's our pleasure caring for you.

## 2019-01-26 NOTE — Progress Notes (Signed)
Subjective  CC:  Chief Complaint  Patient presents with  . Skin burning    Right side of stomach on skin    HPI: William Solis is a 69 y.o. male who presents to the office today to address the problems listed above in the chief complaint.  Marden Noble had a "slipped rib" with right back pain over the christmas holiday.  He stopped the help from his chiropractor who has been making adjustments to correct the problem.  His back pain is improving, he has had 2 manipulations and is seeing him again next week however over the last 2 weeks he has developed some hypersensitivity and burning pain over small area in the right anterior lower chest or upper abdominal wall.  He has not had any rash swelling GI symptoms fevers, chills although he has felt fatigued.  The pain or hypersensitivity is only present with light touch.  There are no positional symptoms.  No change with eating or drinking.  No GERD symptoms.  No shortness of breath or pleuritic chest pain. Assessment  1. Paresthesia of skin      Plan   Paresthesias from irritation of the nerve root likely from the recent rib manipulation: Educated and reassured.  Trial of capsaicin cream and increasing dose of gabapentin back to 3 times daily to help calm down symptoms.  Follow-up if unimproved.  Follow up: No follow-ups on file.  03/30/2019  No orders of the defined types were placed in this encounter.  No orders of the defined types were placed in this encounter.     I reviewed the patients updated PMH, FH, and SocHx.    Patient Active Problem List   Diagnosis Date Noted  . Postlaminectomy syndrome 04/01/2017    Priority: High  . OSA on CPAP 11/07/2015    Priority: High  . History of osteomyelitis 10/25/2013    Priority: High  . Chronic low back pain 05/12/2012    Priority: High  . Narcotic dependence ([place]) 05/12/2012    Priority: High  . History of hepatitis C 04/19/2010    Priority: High  . Essential hypertension 04/19/2010     Priority: High  . MDD (major depressive disorder) 11/20/2008    Priority: High  . ADD (attention deficit disorder) 12/01/2006    Priority: High  . Lumbar stenosis 03/09/2017    Priority: Medium  . Fibromyalgia 09/15/2016    Priority: Medium  . Risk for falls 06/25/2016    Priority: Medium  . Bilateral edema of lower extremity 10/12/2015    Priority: Medium  . S/P removal of parathyroid gland ([place]) 12/21/2013    Priority: Low  . History of hyperparathyroidism 11/22/2013    Priority: Low  . Dry eye syndrome 11/20/2008    Priority: Low  . Tremor 09/30/2018  . S/P transmetatarsal amputation of foot, left ([place]) 04/10/2018  . Osteomyelitis of left foot ([place]) 04/03/2018   Current Meds  Medication Sig  . amLODipine (NORVASC) 10 MG tablet Take 1 tablet (10 mg total) by mouth daily.  Rodman Key EX Apply 1 application topically 3 (three) times daily as needed (arthritis pain).  . Ascorbic Acid (VITAMIN C) 1000 MG tablet Take 1,500 mg by mouth daily.   Marland Kitchen aspirin 81 MG chewable tablet Chew by mouth daily. Every 2 days  . carvedilol (COREG) 12.5 MG tablet Take 1 tablet (12.5 mg total) by mouth 2 (two) times daily.  . cholecalciferol (VITAMIN D3) 25 MCG (1000 UT) tablet Take 1,000 Units by mouth  daily.  . citalopram (CELEXA) 20 MG tablet Take 1 tablet (20 mg total) by mouth daily.  . Cyanocobalamin (VITAMIN B-12 PO) Take 1 tablet by mouth every 3 (three) days.  . cyclobenzaprine (FLEXERIL) 10 MG tablet Take 10 mg by mouth 3 (three) times daily as needed for muscle spasms.  Marland Kitchen gabapentin (NEURONTIN) 300 MG capsule Take 1 capsule by mouth 3 (three) times daily as needed.  . hydrochlorothiazide (HYDRODIURIL) 25 MG tablet Take 1 tablet (25 mg total) by mouth daily.  Marland Kitchen lidocaine (LIDODERM) 5 % Place 1 patch onto the skin daily as needed (pain). Remove & Discard patch within 12 hours or as directed by MD  . lisinopril (ZESTRIL) 40 MG tablet Take 1 tablet (40 mg total) by mouth daily.  . Melatonin 3  MG TABS Take 3 mg by mouth at bedtime.   . meloxicam (MOBIC) 15 MG tablet Take 1 tablet (15 mg total) by mouth daily as needed (arthritis pain (hands)).  . Multiple Vitamins-Minerals (MULTIVITAMIN WITH MINERALS) tablet Take 1 tablet by mouth daily.  Marland Kitchen oxyCODONE (OXY IR/ROXICODONE) 5 MG immediate release tablet Take 1-2 tablets (5-10 mg total) by mouth every 4 (four) hours as needed for moderate pain (pain score 4-6).  . Tetrahydrozoline HCl (VISINE OP) Place 1 drop into both eyes 3 (three) times daily as needed (dry eyes).    Allergies: Patient has No Known Allergies. Family History: Patient family history includes Hypertension in his father and mother. Social History:  Patient  reports that he quit smoking about 28 years ago. His smoking use included cigarettes and cigars. He quit after 21.00 years of use. He has never used smokeless tobacco. He reports that he does not drink alcohol or use drugs.  Review of Systems: Constitutional: Negative for fever malaise or anorexia Cardiovascular: negative for chest pain Respiratory: negative for SOB or persistent cough Gastrointestinal: negative for abdominal pain  Objective  Vitals: BP (!) 142/82 (BP Location: Right Arm, Patient Position: Sitting, Cuff Size: Normal)   Pulse 81   Temp 98.4 F (36.9 C) (Temporal)   Ht 6' (1.829 m)   Wt 214 lb 6.4 oz (97.3 kg)   SpO2 97%   BMI 29.08 kg/m  General: no acute distress , A&Ox3 HEENT: PEERL, conjunctiva normal, Cardiovascular:  RRR without murmur or gallop.  Respiratory:  Good breath sounds bilaterally, CTAB with normal respiratory effort Gastrointestinal: soft, flat abdomen, normal active bowel sounds, no palpable masses, no hepatosplenomegaly, no appreciated hernias Skin:  Warm, no rashes, right lower chest wall upper abdominal wall with hypersensitivity burning pain with light touch otherwise nontender     Commons side effects, risks, benefits, and alternatives for medications and  treatment plan prescribed today were discussed, and the patient expressed understanding of the given instructions. Patient is instructed to call or message via MyChart if he/she has any questions or concerns regarding our treatment plan. No barriers to understanding were identified. We discussed Red Flag symptoms and signs in detail. Patient expressed understanding regarding what to do in case of urgent or emergency type symptoms.   Medication list was reconciled, printed and provided to the patient in AVS. Patient instructions and summary information was reviewed with the patient as documented in the AVS. This note was prepared with assistance of Dragon voice recognition software. Occasional wrong-word or sound-a-like substitutions may have occurred due to the inherent limitations of voice recognition software  This visit occurred during the SARS-CoV-2 public health emergency.  Safety protocols were in place, including screening  questions prior to the visit, additional usage of staff PPE, and extensive cleaning of exam room while observing appropriate contact time as indicated for disinfecting solutions.

## 2019-02-01 DIAGNOSIS — Z03818 Encounter for observation for suspected exposure to other biological agents ruled out: Secondary | ICD-10-CM | POA: Diagnosis not present

## 2019-02-01 DIAGNOSIS — Z20828 Contact with and (suspected) exposure to other viral communicable diseases: Secondary | ICD-10-CM | POA: Diagnosis not present

## 2019-02-28 ENCOUNTER — Other Ambulatory Visit: Payer: Self-pay | Admitting: Family Medicine

## 2019-02-28 DIAGNOSIS — M9901 Segmental and somatic dysfunction of cervical region: Secondary | ICD-10-CM | POA: Diagnosis not present

## 2019-02-28 DIAGNOSIS — M5136 Other intervertebral disc degeneration, lumbar region: Secondary | ICD-10-CM | POA: Diagnosis not present

## 2019-02-28 DIAGNOSIS — M9903 Segmental and somatic dysfunction of lumbar region: Secondary | ICD-10-CM | POA: Diagnosis not present

## 2019-02-28 DIAGNOSIS — M9902 Segmental and somatic dysfunction of thoracic region: Secondary | ICD-10-CM | POA: Diagnosis not present

## 2019-02-28 DIAGNOSIS — M5137 Other intervertebral disc degeneration, lumbosacral region: Secondary | ICD-10-CM | POA: Diagnosis not present

## 2019-02-28 DIAGNOSIS — I10 Essential (primary) hypertension: Secondary | ICD-10-CM

## 2019-02-28 DIAGNOSIS — M5032 Other cervical disc degeneration, mid-cervical region, unspecified level: Secondary | ICD-10-CM | POA: Diagnosis not present

## 2019-02-28 DIAGNOSIS — M5135 Other intervertebral disc degeneration, thoracolumbar region: Secondary | ICD-10-CM | POA: Diagnosis not present

## 2019-02-28 DIAGNOSIS — M9904 Segmental and somatic dysfunction of sacral region: Secondary | ICD-10-CM | POA: Diagnosis not present

## 2019-03-08 DIAGNOSIS — M533 Sacrococcygeal disorders, not elsewhere classified: Secondary | ICD-10-CM | POA: Diagnosis not present

## 2019-03-08 DIAGNOSIS — M961 Postlaminectomy syndrome, not elsewhere classified: Secondary | ICD-10-CM | POA: Diagnosis not present

## 2019-03-08 DIAGNOSIS — G8929 Other chronic pain: Secondary | ICD-10-CM | POA: Diagnosis not present

## 2019-03-08 DIAGNOSIS — G894 Chronic pain syndrome: Secondary | ICD-10-CM | POA: Diagnosis not present

## 2019-03-13 ENCOUNTER — Ambulatory Visit: Payer: Medicare HMO | Attending: Internal Medicine

## 2019-03-13 DIAGNOSIS — Z23 Encounter for immunization: Secondary | ICD-10-CM | POA: Insufficient documentation

## 2019-03-13 NOTE — Progress Notes (Signed)
   Covid-19 Vaccination Clinic  Name:  William Solis    MRN: [id number] DOB: 1950/06/13  03/13/2019  [name] was observed post Covid-19 immunization for 15 minutes without incidence. He was provided with Vaccine Information Sheet and instruction to access the V-Safe system.   [name] was instructed to call 911 with any severe reactions post vaccine: Marland Kitchen Difficulty breathing  . Swelling of your face and throat  . A fast heartbeat  . A bad rash all over your body  . Dizziness and weakness    Immunizations Administered    Name Date Dose VIS Date Route   Pfizer COVID-19 Vaccine 03/13/2019  8:27 AM 0.3 mL 12/31/2018 Intramuscular   Manufacturer: [place]   Lot: [id number]   [place]: [id number]

## 2019-03-21 DIAGNOSIS — M9902 Segmental and somatic dysfunction of thoracic region: Secondary | ICD-10-CM | POA: Diagnosis not present

## 2019-03-21 DIAGNOSIS — M5136 Other intervertebral disc degeneration, lumbar region: Secondary | ICD-10-CM | POA: Diagnosis not present

## 2019-03-21 DIAGNOSIS — M9901 Segmental and somatic dysfunction of cervical region: Secondary | ICD-10-CM | POA: Diagnosis not present

## 2019-03-21 DIAGNOSIS — M5032 Other cervical disc degeneration, mid-cervical region, unspecified level: Secondary | ICD-10-CM | POA: Diagnosis not present

## 2019-03-21 DIAGNOSIS — M5135 Other intervertebral disc degeneration, thoracolumbar region: Secondary | ICD-10-CM | POA: Diagnosis not present

## 2019-03-21 DIAGNOSIS — M5137 Other intervertebral disc degeneration, lumbosacral region: Secondary | ICD-10-CM | POA: Diagnosis not present

## 2019-03-21 DIAGNOSIS — M9903 Segmental and somatic dysfunction of lumbar region: Secondary | ICD-10-CM | POA: Diagnosis not present

## 2019-03-21 DIAGNOSIS — M9904 Segmental and somatic dysfunction of sacral region: Secondary | ICD-10-CM | POA: Diagnosis not present

## 2019-03-30 ENCOUNTER — Ambulatory Visit (INDEPENDENT_AMBULATORY_CARE_PROVIDER_SITE_OTHER): Payer: Medicare HMO | Admitting: Family Medicine

## 2019-03-30 ENCOUNTER — Other Ambulatory Visit: Payer: Self-pay

## 2019-03-30 ENCOUNTER — Encounter: Payer: Self-pay | Admitting: Family Medicine

## 2019-03-30 VITALS — BP 128/74 | HR 81 | Temp 98.3°F | Ht 72.0 in | Wt 207.4 lb

## 2019-03-30 DIAGNOSIS — M545 Low back pain, unspecified: Secondary | ICD-10-CM

## 2019-03-30 DIAGNOSIS — G8929 Other chronic pain: Secondary | ICD-10-CM

## 2019-03-30 DIAGNOSIS — F1120 Opioid dependence, uncomplicated: Secondary | ICD-10-CM

## 2019-03-30 DIAGNOSIS — Z9989 Dependence on other enabling machines and devices: Secondary | ICD-10-CM

## 2019-03-30 DIAGNOSIS — M48061 Spinal stenosis, lumbar region without neurogenic claudication: Secondary | ICD-10-CM | POA: Diagnosis not present

## 2019-03-30 DIAGNOSIS — G4733 Obstructive sleep apnea (adult) (pediatric): Secondary | ICD-10-CM

## 2019-03-30 DIAGNOSIS — R29898 Other symptoms and signs involving the musculoskeletal system: Secondary | ICD-10-CM | POA: Diagnosis not present

## 2019-03-30 DIAGNOSIS — R7989 Other specified abnormal findings of blood chemistry: Secondary | ICD-10-CM

## 2019-03-30 DIAGNOSIS — R748 Abnormal levels of other serum enzymes: Secondary | ICD-10-CM

## 2019-03-30 DIAGNOSIS — F3341 Major depressive disorder, recurrent, in partial remission: Secondary | ICD-10-CM

## 2019-03-30 DIAGNOSIS — I10 Essential (primary) hypertension: Secondary | ICD-10-CM | POA: Diagnosis not present

## 2019-03-30 LAB — TSH: TSH: 5.81 u[IU]/mL — ABNORMAL HIGH (ref 0.35–4.50)

## 2019-03-30 LAB — CBC WITH DIFFERENTIAL/PLATELET
Basophils Absolute: 0.1 10*3/uL (ref 0.0–0.1)
Basophils Relative: 0.8 % (ref 0.0–3.0)
Eosinophils Absolute: 0 10*3/uL (ref 0.0–0.7)
Eosinophils Relative: 0.7 % (ref 0.0–5.0)
HCT: 51 % (ref 39.0–52.0)
Hemoglobin: 16.4 g/dL (ref 13.0–17.0)
Lymphocytes Relative: 14.9 % (ref 12.0–46.0)
Lymphs Abs: 1.1 10*3/uL (ref 0.7–4.0)
MCHC: 32.1 g/dL (ref 30.0–36.0)
MCV: 79.2 fl (ref 78.0–100.0)
Monocytes Absolute: 1 10*3/uL (ref 0.1–1.0)
Monocytes Relative: 13.8 % — ABNORMAL HIGH (ref 3.0–12.0)
Neutro Abs: 5.2 10*3/uL (ref 1.4–7.7)
Neutrophils Relative %: 69.8 % (ref 43.0–77.0)
Platelets: 177 10*3/uL (ref 150.0–400.0)
RBC: 6.44 Mil/uL — ABNORMAL HIGH (ref 4.22–5.81)
RDW: 17.3 % — ABNORMAL HIGH (ref 11.5–15.5)
WBC: 7.5 10*3/uL (ref 4.0–10.5)

## 2019-03-30 LAB — COMPREHENSIVE METABOLIC PANEL
ALT: 63 U/L — ABNORMAL HIGH (ref 0–53)
AST: 80 U/L — ABNORMAL HIGH (ref 0–37)
Albumin: 3.6 g/dL (ref 3.5–5.2)
Alkaline Phosphatase: 259 U/L — ABNORMAL HIGH (ref 39–117)
BUN: 28 mg/dL — ABNORMAL HIGH (ref 6–23)
CO2: 22 mEq/L (ref 19–32)
Calcium: 9.4 mg/dL (ref 8.4–10.5)
Chloride: 101 mEq/L (ref 96–112)
Creatinine, Ser: 0.93 mg/dL (ref 0.40–1.50)
GFR: 81.18 mL/min (ref >60.00)
Glucose, Bld: 86 mg/dL (ref 70–99)
Potassium: 4.3 mEq/L (ref 3.5–5.1)
Sodium: 136 mEq/L (ref 135–145)
Total Bilirubin: 0.7 mg/dL (ref 0.2–1.2)
Total Protein: 7.9 g/dL (ref 6.0–8.3)

## 2019-03-30 LAB — SEDIMENTATION RATE: Sed Rate: 130 mm/hr — ABNORMAL HIGH (ref 0–20)

## 2019-03-30 LAB — CK: Total CK: 42 U/L (ref 7–232)

## 2019-03-30 NOTE — Patient Instructions (Signed)
Please return in September for your annual complete physical; please come fasting.  Please call you pain management specialist to discuss your weakness: I suspect they will want an MRI. I have ordered lab work to rule out other causes. I'll give you the results and then see if pain specialist can help or if I will need to do more.  Be cautious, do not fall!  If you have any questions or concerns, please don't hesitate to send me a message via MyChart or call the office at [phone]. Thank you for visiting with [place] today! It's our pleasure caring for you.

## 2019-03-30 NOTE — Progress Notes (Signed)
Subjective  CC:  Chief Complaint  Patient presents with  . Hypertension    checks readings at home occassionally. <140/80    HPI: William Solis is a 69 y.o. male who presents to the office today to address the problems listed above in the chief complaint.  Hypertension f/u: Control is good . Pt reports he is doing well. taking medications as instructed, no medication side effects noted, no TIAs, no chest pain on exertion, no dyspnea on exertion, no swelling of ankles. Reports home readings are consistently normal. Mildly elevated today because he hasn't yet had his medication.  He denies adverse effects from his BP medications. Compliance with medication is good.   C/o of 3-4 week h/o of leg weakness: notices when trying to climb his stairs. Can't go step by step due to weakness. Having to pull himself up. This is new. Never before had this problem. Doesn't notice other problems in legs. No new back pain or worsening back pain. Denies neurogenic claudication sxs. No b/b/ dysfunction. No falls or injuries. Denies numbness or tingling. No upper extremity sxs. No muscle pain or aches. His is not on a statin. No f/c/s. Has h/o spinal stenosis. Reviewed last MRI in 2015. Has posturgical changes, djd changes and spinal stenosis. On chronic pain mgt per specialist. Hasn't yet discussed with them. Pain is controlled.   Mood is well controlled. No worsening depressive sxs. On meds.   OSA on cpap - reports good compliance and control.    Assessment  1. Essential hypertension   2. Proximal leg weakness   3. Spinal stenosis of lumbar region without neurogenic claudication   4. Recurrent major depressive disorder, in partial remission ([place])   5. Chronic bilateral low back pain without sciatica   6. Narcotic dependence ([place])   7. OSA on CPAP      Plan    Hypertension f/u: BP control is well controlled. No change in meds  Hyperlipidemia f/u: discussed elevated risk score. Will consider  statin in the future but need to work up weakness first  Weak hip flexors on exam, bilateral: ? Myopathy or neurologic. rec f/u with back specialist for repeat mri and check lab work. Will involve neuro if needed as well. Pt to call pain specialist  Mood, osa and chronic pain are controlled.   HLD: monitor for now. rec low fat diet.     Education regarding management of these chronic disease states was given. Management strategies discussed on successive visits include dietary and exercise recommendations, goals of achieving and maintaining IBW, and lifestyle modifications aiming for adequate sleep and minimizing stressors.   Follow up: sept for cp  Orders Placed This Encounter  Procedures  . Sedimentation rate  . TSH  . CK (Creatine Kinase)  . Comprehensive metabolic panel  . CBC with Differential/Platelet   No orders of the defined types were placed in this encounter.     BP Readings from Last 3 Encounters:  03/30/19 128/74  01/26/19 (!) 142/82  09/30/18 132/76   Wt Readings from Last 3 Encounters:  03/30/19 207 lb 6.4 oz (94.1 kg)  01/26/19 214 lb 6.4 oz (97.3 kg)  09/30/18 213 lb 12.8 oz (97 kg)    Lab Results  Component Value Date   CHOL 153 09/30/2018   CHOL 155 06/04/2018   CHOL 173 06/02/2017   Lab Results  Component Value Date   HDL 35.70 (L) 09/30/2018   HDL 36.50 (L) 06/04/2018   HDL 42.00 06/02/2017  Lab Results  Component Value Date   LDLCALC 95 09/30/2018   LDLCALC 98 06/04/2018   LDLCALC 105 (H) 06/02/2017   Lab Results  Component Value Date   TRIG 114.0 09/30/2018   TRIG 101.0 06/04/2018   TRIG 129.0 06/02/2017   Lab Results  Component Value Date   CHOLHDL 4 09/30/2018   CHOLHDL 4 06/04/2018   CHOLHDL 4 06/02/2017   No results found for: LDLDIRECT Lab Results  Component Value Date   CREATININE 1.00 09/30/2018   BUN 18 09/30/2018   NA 136 09/30/2018   K 4.1 09/30/2018   CL 100 09/30/2018   CO2 25 09/30/2018    The 10-year  ASCVD risk score Mikey Bussing DC Jr., et al., 2013) is: 16%   Values used to calculate the score:     Age: 56 years     Sex: Male     Is Non-Hispanic African American: No     Diabetic: No     Tobacco smoker: No     Systolic Blood Pressure: 161 mmHg     Is BP treated: Yes     HDL Cholesterol: 35.7 mg/dL     Total Cholesterol: 153 mg/dL  I reviewed the patients updated PMH, FH, and SocHx.    Patient Active Problem List   Diagnosis Date Noted  . Postlaminectomy syndrome 04/01/2017    Priority: High  . OSA on CPAP 11/07/2015    Priority: High  . History of osteomyelitis 10/25/2013    Priority: High  . Chronic low back pain 05/12/2012    Priority: High  . Narcotic dependence ([place]) 05/12/2012    Priority: High  . History of hepatitis C 04/19/2010    Priority: High  . Essential hypertension 04/19/2010    Priority: High  . MDD (major depressive disorder) 11/20/2008    Priority: High  . ADD (attention deficit disorder) 12/01/2006    Priority: High  . S/P transmetatarsal amputation of foot, left ([place]) 04/10/2018    Priority: Medium  . Lumbar stenosis 03/09/2017    Priority: Medium  . Fibromyalgia 09/15/2016    Priority: Medium  . Risk for falls 06/25/2016    Priority: Medium  . Bilateral edema of lower extremity 10/12/2015    Priority: Medium  . Tremor 09/30/2018    Priority: Low  . S/P removal of parathyroid gland ([place]) 12/21/2013    Priority: Low  . History of hyperparathyroidism 11/22/2013    Priority: Low  . Dry eye syndrome 11/20/2008    Priority: Low  . Osteomyelitis of left foot ([place]) 04/03/2018    Allergies: Patient has no known allergies.  Social History: Patient  reports that he quit smoking about 28 years ago. His smoking use included cigarettes and cigars. He quit after 21.00 years of use. He has never used smokeless tobacco. He reports that he does not drink alcohol or use drugs.  Current Meds  Medication Sig  . amLODipine (NORVASC) 10 MG tablet Take 1 tablet  (10 mg total) by mouth daily.  Rodman Key EX Apply 1 application topically 3 (three) times daily as needed (arthritis pain).  . Ascorbic Acid (VITAMIN C) 1000 MG tablet Take 1,500 mg by mouth daily.   Marland Kitchen aspirin 81 MG chewable tablet Chew by mouth daily. Every 2 days  . carvedilol (COREG) 12.5 MG tablet Take 1 tablet (12.5 mg total) by mouth 2 (two) times daily.  . cholecalciferol (VITAMIN D3) 25 MCG (1000 UT) tablet Take 1,000 Units by mouth daily.  . citalopram (CELEXA)  20 MG tablet Take 1 tablet (20 mg total) by mouth daily.  . Cyanocobalamin (VITAMIN B-12 PO) Take 1 tablet by mouth every 3 (three) days.  . cyclobenzaprine (FLEXERIL) 10 MG tablet Take 10 mg by mouth 3 (three) times daily as needed for muscle spasms.  Marland Kitchen gabapentin (NEURONTIN) 300 MG capsule Take 1 capsule by mouth 3 (three) times daily as needed.  . Glucosamine HCl 1500 MG TABS Take 1,500 mg by mouth daily.  . hydrochlorothiazide (HYDRODIURIL) 25 MG tablet TAKE 1 TABLET (25 MG TOTAL) BY MOUTH DAILY.  Marland Kitchen lidocaine (LIDODERM) 5 % Place 1 patch onto the skin daily as needed (pain). Remove & Discard patch within 12 hours or as directed by MD  . lisinopril (ZESTRIL) 40 MG tablet Take 1 tablet (40 mg total) by mouth daily.  . Melatonin 3 MG TABS Take 3 mg by mouth at bedtime.   . meloxicam (MOBIC) 15 MG tablet Take 1 tablet (15 mg total) by mouth daily as needed (arthritis pain (hands)).  . Multiple Vitamins-Minerals (MULTIVITAMIN WITH MINERALS) tablet Take 1 tablet by mouth daily.  Marland Kitchen oxyCODONE (OXY IR/ROXICODONE) 5 MG immediate release tablet Take 1-2 tablets (5-10 mg total) by mouth every 4 (four) hours as needed for moderate pain (pain score 4-6).  . Tetrahydrozoline HCl (VISINE OP) Place 1 drop into both eyes 3 (three) times daily as needed (dry eyes).    Review of Systems: Cardiovascular: negative for chest pain, palpitations, leg swelling, orthopnea Respiratory: negative for SOB, wheezing or persistent cough Gastrointestinal:  negative for abdominal pain Genitourinary: negative for dysuria or gross hematuria  Objective  Vitals: BP 128/74 Comment: by consistent reported home readings  Pulse 81   Temp 98.3 F (36.8 C) (Temporal)   Ht 6' (1.829 m)   Wt 207 lb 6.4 oz (94.1 kg)   SpO2 97%   BMI 28.13 kg/m  General: no acute distress  Psych:  Alert and oriented, normal mood and affect HEENT:  Normocephalic, atraumatic, supple neck  Cardiovascular:  RRR without murmur. no edema Respiratory:  Good breath sounds bilaterally, CTAB with normal respiratory effort Skin:  Warm, no rashes Neurologic:   Mental status is normal MSK: hard for him to stand from seated position without arms, no ttp over mm, normal tone. Back: neg seated SLR bilaterally. Nontender.  Strength: 4/5 hip flexors bilaterally, 5/5 quads, hams, and lower leg strength. Has a hard time standing back up from a squat. No upper ext weakness Neuro: knee DTR 2/2   Commons side effects, risks, benefits, and alternatives for medications and treatment plan prescribed today were discussed, and the patient expressed understanding of the given instructions. Patient is instructed to call or message via MyChart if he/she has any questions or concerns regarding our treatment plan. No barriers to understanding were identified. We discussed Red Flag symptoms and signs in detail. Patient expressed understanding regarding what to do in case of urgent or emergency type symptoms.   Medication list was reconciled, printed and provided to the patient in AVS. Patient instructions and summary information was reviewed with the patient as documented in the AVS. This note was prepared with assistance of Dragon voice recognition software. Occasional wrong-word or sound-a-like substitutions may have occurred due to the inherent limitations of voice recognition software  This visit occurred during the SARS-CoV-2 public health emergency.  Safety protocols were in place, including  screening questions prior to the visit, additional usage of staff PPE, and extensive cleaning of exam room while observing appropriate contact  time as indicated for disinfecting solutions.

## 2019-03-31 ENCOUNTER — Other Ambulatory Visit: Payer: Self-pay

## 2019-03-31 ENCOUNTER — Telehealth: Payer: Self-pay

## 2019-03-31 ENCOUNTER — Other Ambulatory Visit: Payer: Self-pay | Admitting: Family Medicine

## 2019-03-31 DIAGNOSIS — R748 Abnormal levels of other serum enzymes: Secondary | ICD-10-CM

## 2019-03-31 NOTE — Progress Notes (Signed)
Please add on CRP, ggt, free t4, total t3, PTH, dx: elevated alk phosph and abnl tsh. Thanks, Dr. Jonni Sanger '

## 2019-03-31 NOTE — Addendum Note (Signed)
Addended by: Billey Chang on: 03/31/2019 10:22 AM   Modules accepted: Orders

## 2019-04-01 ENCOUNTER — Ambulatory Visit (INDEPENDENT_AMBULATORY_CARE_PROVIDER_SITE_OTHER): Payer: Medicare HMO

## 2019-04-01 ENCOUNTER — Other Ambulatory Visit: Payer: Self-pay

## 2019-04-01 ENCOUNTER — Other Ambulatory Visit (INDEPENDENT_AMBULATORY_CARE_PROVIDER_SITE_OTHER): Payer: Medicare HMO

## 2019-04-01 DIAGNOSIS — R748 Abnormal levels of other serum enzymes: Secondary | ICD-10-CM

## 2019-04-01 DIAGNOSIS — R29898 Other symptoms and signs involving the musculoskeletal system: Secondary | ICD-10-CM

## 2019-04-01 DIAGNOSIS — M545 Low back pain: Secondary | ICD-10-CM | POA: Diagnosis not present

## 2019-04-01 DIAGNOSIS — R7989 Other specified abnormal findings of blood chemistry: Secondary | ICD-10-CM

## 2019-04-01 LAB — GAMMA GT: GGT: 204 U/L — ABNORMAL HIGH (ref 7–51)

## 2019-04-01 LAB — T4, FREE: Free T4: 0.96 ng/dL (ref 0.60–1.60)

## 2019-04-01 LAB — C-REACTIVE PROTEIN: CRP: 3.7 mg/dL (ref 0.5–20.0)

## 2019-04-04 ENCOUNTER — Encounter: Payer: Self-pay | Admitting: Family Medicine

## 2019-04-04 LAB — PTH, INTACT AND CALCIUM
Calcium: 9.2 mg/dL (ref 8.6–10.3)
PTH: 27 pg/mL (ref 14–64)

## 2019-04-04 LAB — T3: T3, Total: 134 ng/dL (ref 76–181)

## 2019-04-06 ENCOUNTER — Ambulatory Visit: Payer: Medicare HMO | Attending: Internal Medicine

## 2019-04-06 DIAGNOSIS — Z23 Encounter for immunization: Secondary | ICD-10-CM

## 2019-04-06 NOTE — Progress Notes (Signed)
   Covid-19 Vaccination Clinic  Name:  CHRISTOPH COPELAN    MRN: [id number] DOB: 1950/04/12  04/06/2019  Mr. Valli was observed post Covid-19 immunization for 15 minutes without incident. He was provided with Vaccine Information Sheet and instruction to access the V-Safe system.   Mr. Keenum was instructed to call 911 with any severe reactions post vaccine: Marland Kitchen Difficulty breathing  . Swelling of face and throat  . A fast heartbeat  . A bad rash all over body  . Dizziness and weakness   Immunizations Administered    Name Date Dose VIS Date Route   Pfizer COVID-19 Vaccine 04/06/2019  8:26 AM 0.3 mL 12/31/2018 Intramuscular   Manufacturer: [place]   Lot: [id number]   [place]: [id number]

## 2019-04-11 ENCOUNTER — Telehealth: Payer: Self-pay

## 2019-04-11 ENCOUNTER — Ambulatory Visit
Admission: RE | Admit: 2019-04-11 | Discharge: 2019-04-11 | Disposition: A | Discharge status: Home / Self Care | Payer: Medicare HMO | Source: Ambulatory Visit | Attending: Family Medicine | Admitting: Family Medicine

## 2019-04-11 ENCOUNTER — Other Ambulatory Visit: Payer: Self-pay | Admitting: Family Medicine

## 2019-04-11 DIAGNOSIS — R748 Abnormal levels of other serum enzymes: Secondary | ICD-10-CM

## 2019-04-11 DIAGNOSIS — R160 Hepatomegaly, not elsewhere classified: Secondary | ICD-10-CM

## 2019-04-11 DIAGNOSIS — Z8619 Personal history of other infectious and parasitic diseases: Secondary | ICD-10-CM

## 2019-04-11 NOTE — Telephone Encounter (Signed)
FYI..William Solis form [facility] called for patient's US Abdomen report that was done today 04/11/2019. Please review. Thanks

## 2019-04-12 ENCOUNTER — Ambulatory Visit
Admission: RE | Admit: 2019-04-12 | Discharge: 2019-04-12 | Disposition: A | Discharge status: Home / Self Care | Payer: Medicare HMO | Source: Ambulatory Visit | Attending: Family Medicine | Admitting: Family Medicine

## 2019-04-12 ENCOUNTER — Other Ambulatory Visit: Payer: Self-pay | Admitting: Family Medicine

## 2019-04-12 ENCOUNTER — Other Ambulatory Visit: Payer: Self-pay

## 2019-04-12 DIAGNOSIS — R748 Abnormal levels of other serum enzymes: Secondary | ICD-10-CM | POA: Diagnosis not present

## 2019-04-12 DIAGNOSIS — R160 Hepatomegaly, not elsewhere classified: Secondary | ICD-10-CM | POA: Diagnosis not present

## 2019-04-12 DIAGNOSIS — K8020 Calculus of gallbladder without cholecystitis without obstruction: Secondary | ICD-10-CM | POA: Diagnosis not present

## 2019-04-12 DIAGNOSIS — K7689 Other specified diseases of liver: Secondary | ICD-10-CM | POA: Diagnosis not present

## 2019-04-12 DIAGNOSIS — Z8619 Personal history of other infectious and parasitic diseases: Secondary | ICD-10-CM

## 2019-04-12 LAB — POCT I-STAT CREATININE: Creatinine, Ser: 0.9 mg/dL (ref 0.61–1.24)

## 2019-04-12 MED ORDER — GADOBUTROL 1 MMOL/ML IV SOLN
9.0000 mL | Freq: Once | INTRAVENOUS | Status: AC | PRN
  Administered 2019-04-12: 9 mL via INTRAVENOUS

## 2019-04-13 ENCOUNTER — Telehealth: Payer: Self-pay

## 2019-04-13 ENCOUNTER — Other Ambulatory Visit: Payer: Self-pay | Admitting: Family Medicine

## 2019-04-13 DIAGNOSIS — Z8619 Personal history of other infectious and parasitic diseases: Secondary | ICD-10-CM

## 2019-04-13 DIAGNOSIS — R748 Abnormal levels of other serum enzymes: Secondary | ICD-10-CM

## 2019-04-13 DIAGNOSIS — C220 Liver cell carcinoma: Secondary | ICD-10-CM

## 2019-04-13 DIAGNOSIS — R160 Hepatomegaly, not elsewhere classified: Secondary | ICD-10-CM

## 2019-04-13 DIAGNOSIS — I10 Essential (primary) hypertension: Secondary | ICD-10-CM

## 2019-04-13 NOTE — Progress Notes (Signed)
Spoke with [name] to give him the results of the MRI: likely hepatocellular carcinoma; urgent referral to oncology placed

## 2019-04-13 NOTE — Progress Notes (Signed)
Left voice message on patient identified voice message system explaining we had received a referral from Dr. Jonni Sanger because his scan had show 2 large liver masses.  I informed him that we would like to see him on Friday 3/26 at 8:00 am with our Dr. Benay Spice to arrive at 7:45 am.  I asked him to call me back to let me know if he is able to come in on Friday.

## 2019-04-13 NOTE — Telephone Encounter (Signed)
Spoke with patient's wife William Solis regarding appointment on Friday 3/26 at 8:00 am with Dr. Benay Spice, she was given our physical location, parking information and my direct phone number.  I instructed her to arrive at least 15 minutes early to register.  She verbalized an understanding and agreed to the appointment.

## 2019-04-15 ENCOUNTER — Inpatient Hospital Stay: Payer: Medicare HMO

## 2019-04-15 ENCOUNTER — Other Ambulatory Visit: Payer: Self-pay

## 2019-04-15 ENCOUNTER — Inpatient Hospital Stay: Payer: Medicare HMO | Attending: Oncology | Admitting: Oncology

## 2019-04-15 VITALS — BP 128/79 | HR 83 | Temp 98.7°F | Resp 17 | Ht 72.0 in | Wt 209.9 lb

## 2019-04-15 DIAGNOSIS — F909 Attention-deficit hyperactivity disorder, unspecified type: Secondary | ICD-10-CM | POA: Diagnosis not present

## 2019-04-15 DIAGNOSIS — Z8619 Personal history of other infectious and parasitic diseases: Secondary | ICD-10-CM | POA: Diagnosis not present

## 2019-04-15 DIAGNOSIS — K769 Liver disease, unspecified: Secondary | ICD-10-CM | POA: Insufficient documentation

## 2019-04-15 DIAGNOSIS — R160 Hepatomegaly, not elsewhere classified: Secondary | ICD-10-CM | POA: Diagnosis not present

## 2019-04-15 DIAGNOSIS — K7460 Unspecified cirrhosis of liver: Secondary | ICD-10-CM | POA: Insufficient documentation

## 2019-04-15 DIAGNOSIS — F419 Anxiety disorder, unspecified: Secondary | ICD-10-CM | POA: Insufficient documentation

## 2019-04-15 DIAGNOSIS — F329 Major depressive disorder, single episode, unspecified: Secondary | ICD-10-CM | POA: Diagnosis not present

## 2019-04-15 DIAGNOSIS — Z87891 Personal history of nicotine dependence: Secondary | ICD-10-CM | POA: Diagnosis not present

## 2019-04-15 DIAGNOSIS — I10 Essential (primary) hypertension: Secondary | ICD-10-CM | POA: Diagnosis not present

## 2019-04-15 DIAGNOSIS — J45909 Unspecified asthma, uncomplicated: Secondary | ICD-10-CM | POA: Insufficient documentation

## 2019-04-15 LAB — PROTIME-INR
INR: 1 (ref 0.8–1.2)
Prothrombin Time: 13.2 seconds (ref 11.4–15.2)

## 2019-04-15 NOTE — Progress Notes (Signed)
[facility] Patient Consult   Requesting MD: Leamon Arnt, [place],  [place] [address]   KVON MCILHENNY 69 y.o.  25-Feb-1950    Reason for Consult: Liver masses   HPI: Mr. Zoll has a history of hepatitis C, treated in 2019.  He was noted to have evaded liver enzymes when he saw Dr. Jonni Sanger on 03/30/2019.  An abdominal ultrasound on 04/11/2019 revealed 2 large hepatic masses concerning for malignancy. He was referred for an MRI of the liver on 04/12/2019 this confirmed a dominant central hepatic mass measuring 16 x 13 x 12 cm.  A second lesion measured 7.5 x 7 x 5.5 cm in segment 6.  These lesions are consistent with hepatocellular carcinoma.  There are several other arterial phase enhancing lesions in both lobes of the liver.  There is suspicion for middle hepatic vein thrombosis and a small amount of tumor thrombus in the IVC.  There are enlarged periportal and celiac nodes.  He is referred for oncology evaluation.  He reports no known history of cirrhosis.  Past Medical History:  Diagnosis Date  . ADHD, adult residual type    takes Ritalin daily  . Anxiety   . Arthritis   . Bipolar disorder ([place])    "a touch"  . Cutaneous abscess of left foot 04/09/2018  . Depression    takes Lexapro daily  . Hepatitis C    resolved with treatment 2019. released from hepatology  . Hypertension    takes Lisinopril,Metoprolol,and Amlodipine daily  . Insomnia    takes Melatonin nightly  . Left foot pain 04/09/2018  . Muscle spasm    takes Flexeril daily  . Neuropathy involving both lower extremities    takes Gabapentin daily  . Open wound of left foot 04/03/2018  . Pneumonia   . Septic arthritis of left foot ([place]) 04/03/2018    .  Chronic back pain   .  Asthma  Past Surgical History:  Procedure Laterality Date  . AMPUTATION Left 10/21/2013   Procedure: LEFT FOOT SECOND RAY AMPUTATION ;  Surgeon: [name], MD;  Location: [facility];  Service:  Orthopedics;  Laterality: Left;  . AMPUTATION Left 04/10/2018   Procedure: LEFT TRANSMETATARSAL AMPUTATION;  Surgeon: [name], MD;  Location: [facility];  Service: Orthopedics;  Laterality: Left;  . BACK SURGERY  2006   Disc  . COLONOSCOPY    . PARATHYROIDECTOMY N/A 12/21/2013   Procedure: PARATHYROIDECTOMY;  Surgeon: Ralene Ok, MD;  Location: [facility];  Service: General;  Laterality: N/A;  . SINUS EXPLORATION  2005/2007  . TEE WITHOUT CARDIOVERSION N/A 10/24/2013   Procedure: TRANSESOPHAGEAL ECHOCARDIOGRAM (TEE);  Surgeon: Fay Records, MD;  [facility] ENDOSCOPY;  Service: Cardiovascular;  Laterality: N/A;    Medications: Reviewed  Allergies: No Known Allergies  Family history: A maternal great aunt had "cancer ".  Social History:   He lives with his wife in [place].  He is a retired Chief Financial Officer.  He quit smoking cigarettes in 1992.  No alcohol use.  No transfusion history.  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  ROS:   Positives include: Fullness in the right abdomen, right leg weakness progressive over several months-present since the time of back surgery, tenderness of the "skin "in the right upper quadrant  A complete ROS was otherwise negative.  Physical Exam:  Blood pressure 128/79, pulse 83, temperature 98.7 F (  37.1 C), temperature source Tympanic, resp. rate 17, height 6' (1.829 m), weight 209 lb 14.4 oz (95.2 kg), SpO2 94 %.  HEENT: Neck without mass Lungs: Lungs clear bilaterally Cardiac: Regular rate and rhythm Abdomen: Fullness in the right upper abdomen without a discrete liver edge.  No apparent ascites.  No splenomegaly  Vascular: No leg edema Lymph nodes: No cervical, supraclavicular, axillary, or inguinal nodes Neurologic: Alert and oriented, the motor exam appears intact in the upper and lower remedies bilaterally except for mild weakness with flexion at the right hip and dorsi flexion  of the left foot Skin: Spider telangiectasias at the upper chest and back Musculoskeletal: No spine tenderness   LAB:  CBC  Lab Results  Component Value Date   WBC 7.5 03/30/2019   HGB 16.4 03/30/2019   HCT 51.0 03/30/2019   MCV 79.2 03/30/2019   PLT 177.0 03/30/2019   NEUTROABS 5.2 03/30/2019        CMP  Lab Results  Component Value Date   NA 136 03/30/2019   K 4.3 03/30/2019   CL 101 03/30/2019   CO2 22 03/30/2019   GLUCOSE 86 03/30/2019   BUN 28 (H) 03/30/2019   CREATININE 0.90 04/12/2019   CALCIUM 9.2 04/01/2019   PROT 7.9 03/30/2019   ALBUMIN 3.6 03/30/2019   AST 80 (H) 03/30/2019   ALT 63 (H) 03/30/2019   ALKPHOS 259 (H) 03/30/2019   BILITOT 0.7 03/30/2019   GFRNONAA >60 02/16/2015   GFRAA >60 02/16/2015     Imaging:  MRI from 04/12/2019 reviewed with Mr. Isaacson and his wife.  I reviewed the images with the radiologist.  The radiologist confirmed the imaging features are consistent with a LiRads 5 criteria for [facility].   Assessment/Plan:   1. Hepatocellular carcinoma  Ultrasound abdomen 04/11/1999-2 solid hepatic masses, largest measuring 15 cm  MRI liver 04/12/2019-cirrhosis, dominant central hepatic mass, dominant segment 6 mass, several small enhancing lesions in the bilateral liver, imaging features consistent with LR-5 criteria 2. History of hepatitis C, treated in 2019 3. Depression 4. ADHD 5. Lower extremity neuropathy 6. Hypertension 7. Status post partial left foot amputation   Disposition: Mr. Colvin has a clinical presentation consistent with hepatocellular carcinoma.  He appears to have underlying cirrhosis.  He will not be a candidate for surgery.  We discussed treatment options including ablation therapy, embolization, and systemic therapy.  I will present his case at the GI tumor conference on 04/20/2019.  My initial impression is to consider embolization of the dominant disease versus initiating systemic therapy with  atezolizumab/bevacizumab.  He will return to the lab for an AFP today.  Mr. Maye will return for an office visit on 04/28/2019.   Betsy Coder, MD  04/15/2019, 8:12 AM

## 2019-04-15 NOTE — Progress Notes (Signed)
Brought patient back to exam room, obtained vital signs, reviewed medication list for his initial medical oncology appointment with Dr. Benay Spice. He was accompanied by his wife Santiago Glad.  I had spoken with her previously on the phone.  They were given my business card with my direct phone number and encouraged to call with any questions or concerns.  They have an understanding of what my role as GI nurse navigator is as well.  At the end of his visit I accompanied them to the lab for blood work.  They understand his case will be discussed on Wednesday 3/31 at Tumor Board and we will call them after with the recommendations.

## 2019-04-16 LAB — AFP TUMOR MARKER: AFP, Serum, Tumor Marker: 5199 ng/mL — ABNORMAL HIGH (ref 0.0–8.3)

## 2019-04-18 ENCOUNTER — Telehealth: Payer: Self-pay | Admitting: Oncology

## 2019-04-18 NOTE — Telephone Encounter (Signed)
Scheduled per los. Called and spoke with patient. Confirmed appt 

## 2019-04-18 NOTE — Telephone Encounter (Signed)
error 

## 2019-04-20 ENCOUNTER — Other Ambulatory Visit: Payer: Self-pay

## 2019-04-20 NOTE — Progress Notes (Signed)
Spoke with patient's wife Santiago Glad regarding results of discussion at Tumor Board this morning.  Explained there is no need for liver biopsy and he is not a candidate for surgery, transplant or embolization.  We will talk about systemic treatment at their appointment on 4/9.  She asked for earlier appointment as he is having increased pain in right abdominal area.  I have changed their appointment to 4/5 at 8:45. Dr. Benay Spice was made aware.

## 2019-04-25 ENCOUNTER — Ambulatory Visit: Payer: Medicare HMO | Admitting: Oncology

## 2019-04-25 ENCOUNTER — Inpatient Hospital Stay: Payer: Medicare HMO | Attending: Oncology | Admitting: Oncology

## 2019-04-25 ENCOUNTER — Encounter: Payer: Self-pay | Admitting: Oncology

## 2019-04-25 ENCOUNTER — Other Ambulatory Visit: Payer: Self-pay

## 2019-04-25 VITALS — BP 119/69 | HR 85 | Temp 98.0°F | Resp 18 | Ht 72.0 in | Wt 210.0 lb

## 2019-04-25 DIAGNOSIS — F909 Attention-deficit hyperactivity disorder, unspecified type: Secondary | ICD-10-CM | POA: Diagnosis not present

## 2019-04-25 DIAGNOSIS — Z8619 Personal history of other infectious and parasitic diseases: Secondary | ICD-10-CM | POA: Diagnosis not present

## 2019-04-25 DIAGNOSIS — Z7189 Other specified counseling: Secondary | ICD-10-CM | POA: Diagnosis not present

## 2019-04-25 DIAGNOSIS — R0609 Other forms of dyspnea: Secondary | ICD-10-CM | POA: Insufficient documentation

## 2019-04-25 DIAGNOSIS — I10 Essential (primary) hypertension: Secondary | ICD-10-CM | POA: Diagnosis not present

## 2019-04-25 DIAGNOSIS — Z7901 Long term (current) use of anticoagulants: Secondary | ICD-10-CM | POA: Diagnosis not present

## 2019-04-25 DIAGNOSIS — F329 Major depressive disorder, single episode, unspecified: Secondary | ICD-10-CM | POA: Insufficient documentation

## 2019-04-25 DIAGNOSIS — C220 Liver cell carcinoma: Secondary | ICD-10-CM | POA: Diagnosis not present

## 2019-04-25 DIAGNOSIS — Z5112 Encounter for antineoplastic immunotherapy: Secondary | ICD-10-CM | POA: Diagnosis not present

## 2019-04-25 DIAGNOSIS — R160 Hepatomegaly, not elsewhere classified: Secondary | ICD-10-CM

## 2019-04-25 MED ORDER — OXYCODONE HCL 10 MG PO TABS: 10.0000 mg | ORAL_TABLET | ORAL | 0 refills | Status: DC | PRN

## 2019-04-25 NOTE — Progress Notes (Signed)
START OFF PATHWAY REGIMEN - Other   [id number]:Atezolizumab 1,200 mg IV D1 + Bevacizumab 15 mg/kg IV D1 q21 Days:   A cycle is every 21 Days:     Atezolizumab      Bevacizumab-xxxx   **Always confirm dose/schedule in your pharmacy ordering system**  Patient Characteristics: Intent of Therapy: Non-Curative / Palliative Intent, Discussed with Patient 

## 2019-04-25 NOTE — Progress Notes (Signed)
  [facility] OFFICE PROGRESS NOTE   Diagnosis: Hepatocellular carcinoma  INTERVAL HISTORY:   William Solis returns prior to a scheduled visit.  He reports increased abdominal pain.  The pain improves with oxycodone, but he has a limited supply given to him by the pain clinic.  He has anorexia.  He has dyspnea when bending forward.  Objective:  Vital signs in last 24 hours:  Blood pressure 119/69, pulse 85, temperature 98 F (36.7 C), temperature source Temporal, resp. rate 18, height 6' (1.829 m), weight 210 lb (95.3 kg), SpO2 96 %.    Resp: Decreased breath sounds with inspiratory rales at the right posterior base, no respiratory distress Cardio: Regular rate and rhythm GI: The liver is palpable in the right upper abdomen, no splenomegaly Vascular: No leg edema    Portacath/PICC-without erythema  Lab Results:  Lab Results  Component Value Date   WBC 7.5 03/30/2019   HGB 16.4 03/30/2019   HCT 51.0 03/30/2019   MCV 79.2 03/30/2019   PLT 177.0 03/30/2019   NEUTROABS 5.2 03/30/2019    CMP  Lab Results  Component Value Date   NA 136 03/30/2019   K 4.3 03/30/2019   CL 101 03/30/2019   CO2 22 03/30/2019   GLUCOSE 86 03/30/2019   BUN 28 (H) 03/30/2019   CREATININE 0.90 04/12/2019   CALCIUM 9.2 04/01/2019   PROT 7.9 03/30/2019   ALBUMIN 3.6 03/30/2019   AST 80 (H) 03/30/2019   ALT 63 (H) 03/30/2019   ALKPHOS 259 (H) 03/30/2019   BILITOT 0.7 03/30/2019   GFRNONAA >60 02/16/2015   GFRAA >60 02/16/2015     Medications: I have reviewed the patient's current medications.   Assessment/Plan: 1. Hepatocellular carcinoma  Ultrasound abdomen 04/11/1999-2 solid hepatic masses, largest measuring 15 cm  MRI liver 04/12/2019-cirrhosis, dominant central hepatic mass, dominant segment 6 mass, several small enhancing lesions in the bilateral liver, imaging features consistent with LR-5 criteria 2. History of hepatitis C, treated in  2019 3. Depression 4. ADHD 5. Lower extremity neuropathy 6. Hypertension 7. Status post partial left foot amputation   Disposition: Mr. Cannedy has multifocal hepatocellular carcinoma.  I presented his case at the GI tumor conference last week.  He is not a candidate for surgery, ablation, or hepatic embolization.  His clinical status has declined since I saw him 2 weeks ago.  I recommend beginning systemic therapy with the atezolizumab and bevacizumab.  He agrees to proceed. We reviewed potential toxicities associated with atezolizumab including the chance of rash, diarrhea, pneumonitis, hepatitis, hypothyroidism, and other autoimmune toxicities.  We discussed the hypertension, bleeding, thromboembolic disease, bowel perforation, CNS toxicity, delayed wound healing, and nephrotoxicity associated with bevacizumab.  He will return for a chemotherapy teaching class and lab visit tomorrow.  He will be scheduled for cycle 1 atezolizumab and bevacizumab on 04/28/2019.  He will return for an office visit next week.  I refilled his prescription for oxycodone and increase the number of tablets. Betsy Coder, MD  04/25/2019  2:33 PM

## 2019-04-26 ENCOUNTER — Inpatient Hospital Stay: Payer: Medicare HMO

## 2019-04-26 ENCOUNTER — Other Ambulatory Visit: Payer: Self-pay

## 2019-04-26 ENCOUNTER — Encounter: Payer: Self-pay | Admitting: Oncology

## 2019-04-26 DIAGNOSIS — C220 Liver cell carcinoma: Secondary | ICD-10-CM | POA: Diagnosis not present

## 2019-04-26 DIAGNOSIS — Z7901 Long term (current) use of anticoagulants: Secondary | ICD-10-CM | POA: Diagnosis not present

## 2019-04-26 DIAGNOSIS — F909 Attention-deficit hyperactivity disorder, unspecified type: Secondary | ICD-10-CM | POA: Diagnosis not present

## 2019-04-26 DIAGNOSIS — R0609 Other forms of dyspnea: Secondary | ICD-10-CM | POA: Diagnosis not present

## 2019-04-26 DIAGNOSIS — R160 Hepatomegaly, not elsewhere classified: Secondary | ICD-10-CM

## 2019-04-26 DIAGNOSIS — I10 Essential (primary) hypertension: Secondary | ICD-10-CM | POA: Diagnosis not present

## 2019-04-26 DIAGNOSIS — Z8619 Personal history of other infectious and parasitic diseases: Secondary | ICD-10-CM | POA: Diagnosis not present

## 2019-04-26 DIAGNOSIS — F329 Major depressive disorder, single episode, unspecified: Secondary | ICD-10-CM | POA: Diagnosis not present

## 2019-04-26 DIAGNOSIS — Z5112 Encounter for antineoplastic immunotherapy: Secondary | ICD-10-CM | POA: Diagnosis not present

## 2019-04-26 LAB — CBC WITH DIFFERENTIAL (CANCER CENTER ONLY)
Abs Immature Granulocytes: 0.03 10*3/uL (ref 0.00–0.07)
Basophils Absolute: 0.1 10*3/uL (ref 0.0–0.1)
Basophils Relative: 1 %
Eosinophils Absolute: 0.1 10*3/uL (ref 0.0–0.5)
Eosinophils Relative: 1 %
HCT: 50.3 % (ref 39.0–52.0)
Hemoglobin: 15.2 g/dL (ref 13.0–17.0)
Immature Granulocytes: 0 %
Lymphocytes Relative: 11 %
Lymphs Abs: 1 10*3/uL (ref 0.7–4.0)
MCH: 24.4 pg — ABNORMAL LOW (ref 26.0–34.0)
MCHC: 30.2 g/dL (ref 30.0–36.0)
MCV: 80.7 fL (ref 80.0–100.0)
Monocytes Absolute: 1.4 10*3/uL — ABNORMAL HIGH (ref 0.1–1.0)
Monocytes Relative: 15 %
Neutro Abs: 6.4 10*3/uL (ref 1.7–7.7)
Neutrophils Relative %: 72 %
Platelet Count: 206 10*3/uL (ref 150–400)
RBC: 6.23 MIL/uL — ABNORMAL HIGH (ref 4.22–5.81)
RDW: 18.6 % — ABNORMAL HIGH (ref 11.5–15.5)
WBC Count: 9 10*3/uL (ref 4.0–10.5)
nRBC: 0 % (ref 0.0–0.2)

## 2019-04-26 LAB — CMP (CANCER CENTER ONLY)
ALT: 99 U/L — ABNORMAL HIGH (ref 0–44)
AST: 82 U/L — ABNORMAL HIGH (ref 15–41)
Albumin: 2.6 g/dL — ABNORMAL LOW (ref 3.5–5.0)
Alkaline Phosphatase: 295 U/L — ABNORMAL HIGH (ref 38–126)
Anion gap: 11 (ref 5–15)
BUN: 20 mg/dL (ref 8–23)
CO2: 23 mmol/L (ref 22–32)
Calcium: 8.7 mg/dL — ABNORMAL LOW (ref 8.9–10.3)
Chloride: 102 mmol/L (ref 98–111)
Creatinine: 0.83 mg/dL (ref 0.61–1.24)
GFR, Est AFR Am: >60 mL/min (ref >60)
GFR, Estimated: >60 mL/min (ref >60)
Glucose, Bld: 138 mg/dL — ABNORMAL HIGH (ref 70–99)
Potassium: 3.9 mmol/L (ref 3.5–5.1)
Sodium: 136 mmol/L (ref 135–145)
Total Bilirubin: 0.7 mg/dL (ref 0.3–1.2)
Total Protein: 7.4 g/dL (ref 6.5–8.1)

## 2019-04-26 NOTE — Progress Notes (Signed)
Met with patient at registration to introduce myself as Arboriculturist and to offer available resources.  Discussed one-time $1000 Radio broadcast assistant to assist with personal expenses while going through treatment.  Gave him my card if interested in applying and for any additional financial questions or concerns.

## 2019-04-27 ENCOUNTER — Telehealth: Payer: Self-pay | Admitting: *Deleted

## 2019-04-27 LAB — AFP TUMOR MARKER: AFP, Serum, Tumor Marker: 5690 ng/mL — ABNORMAL HIGH (ref 0.0–8.3)

## 2019-04-27 NOTE — Telephone Encounter (Signed)
Informed PA that he has increased pain now due to [facility] and Dr. Benay Spice feels he needs the pain medication increased to Q 4 hours prn. He is willing to manage his cancer pain if OK w/pain clinic. Opal Sidles, PA feels this is in his best interest and he can be referred back to them when MD feels it is appropriate and they will resume his pain management.

## 2019-04-28 ENCOUNTER — Inpatient Hospital Stay: Payer: Medicare HMO

## 2019-04-28 ENCOUNTER — Other Ambulatory Visit: Payer: Self-pay | Admitting: *Deleted

## 2019-04-28 ENCOUNTER — Other Ambulatory Visit: Payer: Self-pay

## 2019-04-28 VITALS — BP 145/80 | HR 86 | Temp 98.9°F | Resp 18

## 2019-04-28 DIAGNOSIS — R0609 Other forms of dyspnea: Secondary | ICD-10-CM | POA: Diagnosis not present

## 2019-04-28 DIAGNOSIS — Z5112 Encounter for antineoplastic immunotherapy: Secondary | ICD-10-CM | POA: Diagnosis not present

## 2019-04-28 DIAGNOSIS — C220 Liver cell carcinoma: Secondary | ICD-10-CM

## 2019-04-28 DIAGNOSIS — F909 Attention-deficit hyperactivity disorder, unspecified type: Secondary | ICD-10-CM | POA: Diagnosis not present

## 2019-04-28 DIAGNOSIS — F329 Major depressive disorder, single episode, unspecified: Secondary | ICD-10-CM | POA: Diagnosis not present

## 2019-04-28 DIAGNOSIS — Z7901 Long term (current) use of anticoagulants: Secondary | ICD-10-CM | POA: Diagnosis not present

## 2019-04-28 DIAGNOSIS — I10 Essential (primary) hypertension: Secondary | ICD-10-CM | POA: Diagnosis not present

## 2019-04-28 DIAGNOSIS — Z8619 Personal history of other infectious and parasitic diseases: Secondary | ICD-10-CM | POA: Diagnosis not present

## 2019-04-28 LAB — TOTAL PROTEIN, URINE DIPSTICK

## 2019-04-28 MED ORDER — SODIUM CHLORIDE 0.9 % IV SOLN
1200.0000 mg | Freq: Once | INTRAVENOUS | Status: AC
  Administered 2019-04-28: 15:00:00 1200 mg via INTRAVENOUS
  Filled 2019-04-28: qty 20

## 2019-04-28 MED ORDER — SODIUM CHLORIDE 0.9 % IV SOLN
Freq: Once | INTRAVENOUS | Status: AC
  Filled 2019-04-28: qty 250

## 2019-04-28 MED ORDER — SODIUM CHLORIDE 0.9 % IV SOLN
15.0000 mg/kg | Freq: Once | INTRAVENOUS | Status: AC
  Administered 2019-04-28: 1400 mg via INTRAVENOUS
  Filled 2019-04-28: qty 48

## 2019-04-28 NOTE — Progress Notes (Signed)
Urine protein ordered, will not delay treatment while awaiting results.  Larene Beach, PharmD

## 2019-04-28 NOTE — Patient Instructions (Signed)
[facility] Discharge Instructions for Patients Receiving Chemotherapy  Today you received the following chemotherapy agents atezolizumab; bevacizumab  To help prevent nausea and vomiting after your treatment, we encourage you to take your nausea medication as directed   If you develop nausea and vomiting that is not controlled by your nausea medication, call the clinic.   BELOW ARE SYMPTOMS THAT SHOULD BE REPORTED IMMEDIATELY:  *FEVER GREATER THAN 100.5 F  *CHILLS WITH OR WITHOUT FEVER  NAUSEA AND VOMITING THAT IS NOT CONTROLLED WITH YOUR NAUSEA MEDICATION  *UNUSUAL SHORTNESS OF BREATH  *UNUSUAL BRUISING OR BLEEDING  TENDERNESS IN MOUTH AND THROAT WITH OR WITHOUT PRESENCE OF ULCERS  *URINARY PROBLEMS  *BOWEL PROBLEMS  UNUSUAL RASH Items with * indicate a potential emergency and should be followed up as soon as possible.  Feel free to call the clinic should you have any questions or concerns. The clinic phone number is [phone].  Please show the [facility] at check-in to the Emergency Department and triage nurse.  Atezolizumab injection What is this medicine? ATEZOLIZUMAB (a te zoe LIZ ue mab) is a monoclonal antibody. It is used to treat bladder cancer (urothelial cancer), liver cancer, lung cancer, breast cancer, and melanoma. This medicine may be used for other purposes; ask your health care provider or pharmacist if you have questions. COMMON BRAND NAME(S): Tecentriq What should I tell my health care provider before I take this medicine? They need to know if you have any of these conditions: diabetes immune system problems infection inflammatory bowel disease liver disease lung or breathing disease lupus nervous system problems like myasthenia gravis or Guillain-Barre syndrome organ transplant an unusual or allergic reaction to atezolizumab, other medicines, foods, dyes, or preservatives pregnant or trying to get  pregnant breast-feeding How should I use this medicine? This medicine is for infusion into a vein. It is given by a health care professional in a hospital or clinic setting. A special MedGuide will be given to you before each treatment. Be sure to read this information carefully each time. Talk to your pediatrician regarding the use of this medicine in children. Special care may be needed. Overdosage: If you think you have taken too much of this medicine contact a poison control center or emergency room at once. NOTE: This medicine is only for you. Do not share this medicine with others. What if I miss a dose? It is important not to miss your dose. Call your doctor or health care professional if you are unable to keep an appointment. What may interact with this medicine? Interactions have not been studied. This list may not describe all possible interactions. Give your health care provider a list of all the medicines, herbs, non-prescription drugs, or dietary supplements you use. Also tell them if you smoke, drink alcohol, or use illegal drugs. Some items may interact with your medicine. What should I watch for while using this medicine? Your condition will be monitored carefully while you are receiving this medicine. You may need blood work done while you are taking this medicine. Do not become pregnant while taking this medicine or for at least 5 months after stopping it. Women should inform their doctor if they wish to become pregnant or think they might be pregnant. There is a potential for serious side effects to an unborn child. Talk to your health care professional or pharmacist for more information. Do not breast-feed an infant while taking this medicine or for at least 5 months after the  last dose. What side effects may I notice from receiving this medicine? Side effects that you should report to your doctor or health care professional as soon as possible: allergic reactions like skin  rash, itching or hives, swelling of the face, lips, or tongue black, tarry stools bloody or watery diarrhea breathing problems changes in vision chest pain or chest tightness chills facial flushing fever headache signs and symptoms of high blood sugar such as dizziness; dry mouth; dry skin; fruity breath; nausea; stomach pain; increased hunger or thirst; increased urination signs and symptoms of liver injury like dark yellow or brown urine; general ill feeling or flu-like symptoms; light-colored stools; loss of appetite; nausea; right upper belly pain; unusually weak or tired; yellowing of the eyes or skin stomach pain trouble passing urine or change in the amount of urine Side effects that usually do not require medical attention (report to your doctor or health care professional if they continue or are bothersome): bone pain cough diarrhea joint pain muscle pain muscle weakness swelling of arms or legs tiredness weight loss This list may not describe all possible side effects. Call your doctor for medical advice about side effects. You may report side effects to FDA at 1-800-FDA-1088. Where should I keep my medicine? This drug is given in a hospital or clinic and will not be stored at home. NOTE: This sheet is a summary. It may not cover all possible information. If you have questions about this medicine, talk to your doctor, pharmacist, or health care provider.  2020 Elsevier/Gold Standard (2018-08-27 13:11:14)   Bevacizumab injection What is this medicine? BEVACIZUMAB (be va SIZ yoo mab) is a monoclonal antibody. It is used to treat many types of cancer. This medicine may be used for other purposes; ask your health care provider or pharmacist if you have questions. COMMON BRAND NAME(S): Avastin, MVASI, Zirabev What should I tell my health care provider before I take this medicine? They need to know if you have any of these conditions:  diabetes  heart disease  high  blood pressure  history of coughing up blood  prior anthracycline chemotherapy (e.g., doxorubicin, daunorubicin, epirubicin)  recent or ongoing radiation therapy  recent or planning to have surgery  stroke  an unusual or allergic reaction to bevacizumab, hamster proteins, mouse proteins, other medicines, foods, dyes, or preservatives  pregnant or trying to get pregnant  breast-feeding How should I use this medicine? This medicine is for infusion into a vein. It is given by a health care professional in a hospital or clinic setting. Talk to your pediatrician regarding the use of this medicine in children. Special care may be needed. Overdosage: If you think you have taken too much of this medicine contact a poison control center or emergency room at once. NOTE: This medicine is only for you. Do not share this medicine with others. What if I miss a dose? It is important not to miss your dose. Call your doctor or health care professional if you are unable to keep an appointment. What may interact with this medicine? Interactions are not expected. This list may not describe all possible interactions. Give your health care provider a list of all the medicines, herbs, non-prescription drugs, or dietary supplements you use. Also tell them if you smoke, drink alcohol, or use illegal drugs. Some items may interact with your medicine. What should I watch for while using this medicine? Your condition will be monitored carefully while you are receiving this medicine. You will need important  blood work and urine testing done while you are taking this medicine. This medicine may increase your risk to bruise or bleed. Call your doctor or health care professional if you notice any unusual bleeding. Before having surgery, talk to your health care provider to make sure it is ok. This drug can increase the risk of poor healing of your surgical site or wound. You will need to stop this drug for 28 days  before surgery. After surgery, wait at least 28 days before restarting this drug. Make sure the surgical site or wound is healed enough before restarting this drug. Talk to your health care provider if questions. Do not become pregnant while taking this medicine or for 6 months after stopping it. Women should inform their doctor if they wish to become pregnant or think they might be pregnant. There is a potential for serious side effects to an unborn child. Talk to your health care professional or pharmacist for more information. Do not breast-feed an infant while taking this medicine and for 6 months after the last dose. This medicine has caused ovarian failure in some women. This medicine may interfere with the ability to have a child. You should talk to your doctor or health care professional if you are concerned about your fertility. What side effects may I notice from receiving this medicine? Side effects that you should report to your doctor or health care professional as soon as possible:  allergic reactions like skin rash, itching or hives, swelling of the face, lips, or tongue  chest pain or chest tightness  chills  coughing up blood  high fever  seizures  severe constipation  signs and symptoms of bleeding such as bloody or black, tarry stools; red or dark-brown urine; spitting up blood or brown material that looks like coffee grounds; red spots on the skin; unusual bruising or bleeding from the eye, gums, or nose  signs and symptoms of a blood clot such as breathing problems; chest pain; severe, sudden headache; pain, swelling, warmth in the leg  signs and symptoms of a stroke like changes in vision; confusion; trouble speaking or understanding; severe headaches; sudden numbness or weakness of the face, arm or leg; trouble walking; dizziness; loss of balance or coordination  stomach pain  sweating  swelling of legs or ankles  vomiting  weight gain Side effects that  usually do not require medical attention (report to your doctor or health care professional if they continue or are bothersome):  back pain  changes in taste  decreased appetite  dry skin  nausea  tiredness This list may not describe all possible side effects. Call your doctor for medical advice about side effects. You may report side effects to FDA at 1-800-FDA-1088. Where should I keep my medicine? This drug is given in a hospital or clinic and will not be stored at home. NOTE: This sheet is a summary. It may not cover all possible information. If you have questions about this medicine, talk to your doctor, pharmacist, or health care provider.  2020 Elsevier/Gold Standard (2018-11-03 10:50:46)

## 2019-04-29 ENCOUNTER — Other Ambulatory Visit: Payer: Self-pay | Admitting: *Deleted

## 2019-04-29 ENCOUNTER — Telehealth: Payer: Self-pay | Admitting: *Deleted

## 2019-04-29 ENCOUNTER — Ambulatory Visit: Payer: Medicare HMO | Admitting: Nurse Practitioner

## 2019-04-29 MED ORDER — PROCHLORPERAZINE MALEATE 10 MG PO TABS: 10.0000 mg | ORAL_TABLET | Freq: Four times a day (QID) | ORAL | 0 refills | Status: AC | PRN

## 2019-05-02 ENCOUNTER — Other Ambulatory Visit: Payer: Self-pay

## 2019-05-02 ENCOUNTER — Encounter (HOSPITAL_COMMUNITY): Payer: Self-pay | Admitting: Emergency Medicine

## 2019-05-02 ENCOUNTER — Emergency Department (HOSPITAL_COMMUNITY)
Admission: EM | Admit: 2019-05-02 | Discharge: 2019-05-02 | Disposition: A | Discharge status: Home / Self Care | Payer: Medicare HMO | Attending: Emergency Medicine | Admitting: Emergency Medicine

## 2019-05-02 ENCOUNTER — Emergency Department (HOSPITAL_COMMUNITY): Payer: Medicare HMO

## 2019-05-02 DIAGNOSIS — R69 Illness, unspecified: Secondary | ICD-10-CM

## 2019-05-02 DIAGNOSIS — R109 Unspecified abdominal pain: Secondary | ICD-10-CM | POA: Insufficient documentation

## 2019-05-02 DIAGNOSIS — R197 Diarrhea, unspecified: Secondary | ICD-10-CM | POA: Diagnosis not present

## 2019-05-02 DIAGNOSIS — I10 Essential (primary) hypertension: Secondary | ICD-10-CM | POA: Insufficient documentation

## 2019-05-02 DIAGNOSIS — Z87891 Personal history of nicotine dependence: Secondary | ICD-10-CM | POA: Insufficient documentation

## 2019-05-02 DIAGNOSIS — F909 Attention-deficit hyperactivity disorder, unspecified type: Secondary | ICD-10-CM | POA: Diagnosis not present

## 2019-05-02 DIAGNOSIS — R0602 Shortness of breath: Secondary | ICD-10-CM | POA: Insufficient documentation

## 2019-05-02 DIAGNOSIS — I2694 Multiple subsegmental pulmonary emboli without acute cor pulmonale: Secondary | ICD-10-CM | POA: Insufficient documentation

## 2019-05-02 DIAGNOSIS — Z79899 Other long term (current) drug therapy: Secondary | ICD-10-CM | POA: Insufficient documentation

## 2019-05-02 DIAGNOSIS — Z20822 Contact with and (suspected) exposure to covid-19: Secondary | ICD-10-CM | POA: Insufficient documentation

## 2019-05-02 DIAGNOSIS — R05 Cough: Secondary | ICD-10-CM | POA: Diagnosis not present

## 2019-05-02 DIAGNOSIS — I2699 Other pulmonary embolism without acute cor pulmonale: Secondary | ICD-10-CM | POA: Diagnosis not present

## 2019-05-02 DIAGNOSIS — C220 Liver cell carcinoma: Secondary | ICD-10-CM | POA: Diagnosis not present

## 2019-05-02 LAB — COMPREHENSIVE METABOLIC PANEL
ALT: 101 U/L — ABNORMAL HIGH (ref 0–44)
AST: 131 U/L — ABNORMAL HIGH (ref 15–41)
Albumin: 2.7 g/dL — ABNORMAL LOW (ref 3.5–5.0)
Alkaline Phosphatase: 264 U/L — ABNORMAL HIGH (ref 38–126)
Anion gap: 13 (ref 5–15)
BUN: 17 mg/dL (ref 8–23)
CO2: 23 mmol/L (ref 22–32)
Calcium: 8.2 mg/dL — ABNORMAL LOW (ref 8.9–10.3)
Chloride: 95 mmol/L — ABNORMAL LOW (ref 98–111)
Creatinine, Ser: 0.67 mg/dL (ref 0.61–1.24)
GFR calc Af Amer: >60 mL/min (ref >60)
GFR calc non Af Amer: >60 mL/min (ref >60)
Glucose, Bld: 74 mg/dL (ref 70–99)
Potassium: 4 mmol/L (ref 3.5–5.1)
Sodium: 131 mmol/L — ABNORMAL LOW (ref 135–145)
Total Bilirubin: 1.6 mg/dL — ABNORMAL HIGH (ref 0.3–1.2)
Total Protein: 6.7 g/dL (ref 6.5–8.1)

## 2019-05-02 LAB — CBC WITH DIFFERENTIAL/PLATELET
Abs Immature Granulocytes: 0.09 10*3/uL — ABNORMAL HIGH (ref 0.00–0.07)
Basophils Absolute: 0 10*3/uL (ref 0.0–0.1)
Basophils Relative: 0 %
Eosinophils Absolute: 0 10*3/uL (ref 0.0–0.5)
Eosinophils Relative: 0 %
HCT: 51.1 % (ref 39.0–52.0)
Hemoglobin: 15.4 g/dL (ref 13.0–17.0)
Immature Granulocytes: 1 %
Lymphocytes Relative: 9 %
Lymphs Abs: 1.2 10*3/uL (ref 0.7–4.0)
MCH: 24 pg — ABNORMAL LOW (ref 26.0–34.0)
MCHC: 30.1 g/dL (ref 30.0–36.0)
MCV: 79.7 fL — ABNORMAL LOW (ref 80.0–100.0)
Monocytes Absolute: 2.3 10*3/uL — ABNORMAL HIGH (ref 0.1–1.0)
Monocytes Relative: 17 %
Neutro Abs: 9.9 10*3/uL — ABNORMAL HIGH (ref 1.7–7.7)
Neutrophils Relative %: 73 %
Platelets: 158 10*3/uL (ref 150–400)
RBC: 6.41 MIL/uL — ABNORMAL HIGH (ref 4.22–5.81)
RDW: 18.8 % — ABNORMAL HIGH (ref 11.5–15.5)
WBC: 13.5 10*3/uL — ABNORMAL HIGH (ref 4.0–10.5)
nRBC: 0 % (ref 0.0–0.2)

## 2019-05-02 LAB — URINALYSIS, ROUTINE W REFLEX MICROSCOPIC
Bilirubin Urine: NEGATIVE
Glucose, UA: NEGATIVE mg/dL
Hgb urine dipstick: NEGATIVE
Ketones, ur: NEGATIVE mg/dL
Leukocytes,Ua: NEGATIVE
Nitrite: NEGATIVE
Protein, ur: NEGATIVE mg/dL
Specific Gravity, Urine: 1.015 (ref 1.005–1.030)
pH: 6 (ref 5.0–8.0)

## 2019-05-02 LAB — LACTIC ACID, PLASMA
Lactic Acid, Venous: 1.8 mmol/L (ref 0.5–1.9)
Lactic Acid, Venous: 1.7 mmol/L (ref 0.5–1.9)

## 2019-05-02 LAB — LIPASE, BLOOD: Lipase: 20 U/L (ref 11–51)

## 2019-05-02 LAB — TROPONIN I (HIGH SENSITIVITY): Troponin I (High Sensitivity): 6 ng/L (ref <18)

## 2019-05-02 LAB — PROTIME-INR
INR: 1.2 (ref 0.8–1.2)
Prothrombin Time: 14.7 seconds (ref 11.4–15.2)

## 2019-05-02 LAB — BRAIN NATRIURETIC PEPTIDE: B Natriuretic Peptide: 419.6 pg/mL — ABNORMAL HIGH (ref 0.0–100.0)

## 2019-05-02 LAB — POC SARS CORONAVIRUS 2 AG -  ED: SARS Coronavirus 2 Ag: NEGATIVE

## 2019-05-02 MED ORDER — IOHEXOL 350 MG/ML SOLN
100.0000 mL | Freq: Once | INTRAVENOUS | Status: AC | PRN
  Administered 2019-05-02: 100 mL via INTRAVENOUS

## 2019-05-02 MED ORDER — MORPHINE SULFATE (PF) 4 MG/ML IV SOLN
4.0000 mg | Freq: Once | INTRAVENOUS | Status: AC
  Administered 2019-05-02: 14:00:00 4 mg via INTRAVENOUS
  Filled 2019-05-02: qty 1

## 2019-05-02 MED ORDER — SODIUM CHLORIDE (PF) 0.9 % IJ SOLN
INTRAMUSCULAR | Status: AC
  Filled 2019-05-02: qty 50

## 2019-05-02 MED ORDER — RIVAROXABAN (XARELTO) VTE STARTER PACK (15 & 20 MG): ORAL_TABLET | ORAL | 0 refills | Status: DC

## 2019-05-02 MED ORDER — RIVAROXABAN (XARELTO) EDUCATION KIT FOR DVT/PE PATIENTS
PACK | Freq: Once | Status: AC
  Filled 2019-05-02: qty 1

## 2019-05-02 MED ORDER — RIVAROXABAN 15 MG PO TABS
15.0000 mg | ORAL_TABLET | Freq: Once | ORAL | Status: AC
  Administered 2019-05-02: 17:00:00 15 mg via ORAL
  Filled 2019-05-02: qty 1

## 2019-05-02 NOTE — ED Provider Notes (Addendum)
Myalgia, fever on immunotherapy. Sepsis w/u. No Abx yet. Edema, possible CHF. S/E immune therapy. Physical Exam  BP (!) 153/126 (BP Location: Left Arm)   Pulse 82   Temp 100 F (37.8 C) (Rectal)   Resp (!) 27   Ht 6' (1.829 m)   Wt 95.3 kg   SpO2 93%   BMI 28.48 kg/m   Physical Exam  ED Course/Procedures     Procedures  MDM  Call Dr. Benay Spice with result. [id number], nurse [phone]. Consult: Reviewed with Dr. Benay Spice.  Agrees with proceeding with Xarelto for outpatient treatment.  No apparent contraindications at this time.  He will follow-up with the patient this week in the office.  I have reviewed the results with the patient.  He is alert and appropriate.  He denies any prior history of GI bleeding or varices.  Return precautions reviewed.     Charlesetta Shanks, MD 05/02/19 1504    Charlesetta Shanks, MD 05/02/19 [phone]

## 2019-05-02 NOTE — ED Notes (Signed)
Patient given [place] sandwich, graham crackers, saltine crackers, peanut butter and ice water

## 2019-05-02 NOTE — ED Provider Notes (Signed)
[facility] DEPT Provider Note   CSN: [id number] Arrival date & time: 05/02/19  0900     History Chief Complaint  Patient presents with  . Abdominal Pain  . Diarrhea  . Wheezing    William Solis is a 69 y.o. male.  The history is provided by the patient.  Shortness of Breath Severity:  Mild Onset quality:  Gradual Timing:  Constant Progression:  Unchanged Chronicity:  New Context comment:  Patient with new SOB and slightly worse abdominal pain and diarrhea after starting immunotherapy for liver cancer. Had temperature of 100.2 at home.  Relieved by:  Nothing Worsened by:  Nothing Associated symptoms: abdominal pain, cough, fever, rash and wheezing   Associated symptoms: no chest pain, no ear pain, no sore throat and no vomiting   Risk factors: hx of cancer   Risk factors: no hx of PE/DVT        Past Medical History:  Diagnosis Date  . ADHD, adult residual type    takes Ritalin daily  . Anxiety   . Arthritis   . Bipolar disorder ([place])    "a touch"  . Cutaneous abscess of left foot 04/09/2018  . Depression    takes Lexapro daily  . Hepatitis C    resolved with treatment 2019. released from hepatology  . Hypertension    takes Lisinopril,Metoprolol,and Amlodipine daily  . Insomnia    takes Melatonin nightly  . Left foot pain 04/09/2018  . Muscle spasm    takes Flexeril daily  . Neuropathy involving both lower extremities    takes Gabapentin daily  . Open wound of left foot 04/03/2018  . Pneumonia   . Septic arthritis of left foot ([place]) 04/03/2018    Patient Active Problem List   Diagnosis Date Noted  . Hepatocellular carcinoma ([place]) 04/25/2019  . Goals of care, counseling/discussion 04/25/2019  . Tremor 09/30/2018  . S/P transmetatarsal amputation of foot, left ([place]) 04/10/2018  . Osteomyelitis of left foot ([place]) 04/03/2018  . Postlaminectomy syndrome 04/01/2017  . Lumbar stenosis 03/09/2017  . Fibromyalgia 09/15/2016    . Risk for falls 06/25/2016  . OSA on CPAP 11/07/2015  . Bilateral edema of lower extremity 10/12/2015  . S/P removal of parathyroid gland ([place]) 12/21/2013  . History of hyperparathyroidism 11/22/2013  . History of osteomyelitis 10/25/2013  . Chronic low back pain 05/12/2012  . Narcotic dependence ([place]) 05/12/2012  . History of hepatitis C 04/19/2010  . Essential hypertension 04/19/2010  . Dry eye syndrome 11/20/2008  . MDD (major depressive disorder) 11/20/2008  . ADD (attention deficit disorder) 12/01/2006    Past Surgical History:  Procedure Laterality Date  . AMPUTATION Left 10/21/2013   Procedure: LEFT FOOT SECOND RAY AMPUTATION ;  Surgeon: [name], MD;  Location: [facility];  Service: Orthopedics;  Laterality: Left;  . AMPUTATION Left 04/10/2018   Procedure: LEFT TRANSMETATARSAL AMPUTATION;  Surgeon: [name], MD;  Location: [facility];  Service: Orthopedics;  Laterality: Left;  . BACK SURGERY  2006   Disc  . COLONOSCOPY    . PARATHYROIDECTOMY N/A 12/21/2013   Procedure: PARATHYROIDECTOMY;  Surgeon: Ralene Ok, MD;  Location: [facility];  Service: General;  Laterality: N/A;  . SINUS EXPLORATION  2005/2007  . TEE WITHOUT CARDIOVERSION N/A 10/24/2013   Procedure: TRANSESOPHAGEAL ECHOCARDIOGRAM (TEE);  Surgeon: Fay Records, MD;  [facility] ENDOSCOPY;  Service: Cardiovascular;  Laterality: N/A;       Family History  Problem Relation Age of Onset  .  Hypertension Mother   . Hypertension Father   . Diabetes type II Neg Hx     Social History   Tobacco Use  . Smoking status: Former Smoker    Years: 21.00    Types: Cigarettes, Cigars    Quit date: 10/20/1990    Years since quitting: 28.5  . Smokeless tobacco: Never Used  Substance Use Topics  . Alcohol use: No    Alcohol/week: 0.0 standard drinks  . Drug use: No    Home Medications Prior to Admission medications   Medication Sig Start Date End Date Taking? Authorizing Provider  acetaminophen (TYLENOL) 500 MG  tablet Take 500 mg by mouth every 6 (six) hours as needed for mild pain or headache.   Yes [provider]  amLODipine (NORVASC) 10 MG tablet TAKE 1 TABLET EVERY DAY Patient taking differently: Take 10 mg by mouth daily.  04/14/19  Yes Leamon Arnt, MD  ARNICA EX Apply 1 application topically 3 (three) times daily as needed (arthritis pain).   Yes [provider]  Ascorbic Acid (VITAMIN C) 1000 MG tablet Take 1,500 mg by mouth daily.    Yes [provider]  carvedilol (COREG) 12.5 MG tablet Take 1 tablet (12.5 mg total) by mouth 2 (two) times daily. 09/30/18  Yes Leamon Arnt, MD  cholecalciferol (VITAMIN D3) 25 MCG (1000 UT) tablet Take 1,000 Units by mouth daily.   Yes [provider]  citalopram (CELEXA) 20 MG tablet Take 1 tablet (20 mg total) by mouth daily. 11/05/18  Yes Leamon Arnt, MD  Cyanocobalamin (VITAMIN B-12 PO) Take 1 tablet by mouth every 3 (three) days.   Yes [provider]  cyclobenzaprine (FLEXERIL) 10 MG tablet Take 10 mg by mouth 3 (three) times daily as needed for muscle spasms.   Yes [provider]  gabapentin (NEURONTIN) 300 MG capsule Take 300 mg by mouth 3 (three) times daily as needed.  09/07/18  Yes [provider]  Glucosamine HCl 1500 MG TABS Take 1,500 mg by mouth daily.   Yes [provider]  hydrochlorothiazide (HYDRODIURIL) 25 MG tablet TAKE 1 TABLET (25 MG TOTAL) BY MOUTH DAILY. 02/28/19  Yes Leamon Arnt, MD  ibuprofen (ADVIL) 200 MG tablet Take 400 mg by mouth every 6 (six) hours as needed for fever or moderate pain.   Yes [provider]  lidocaine (LIDODERM) 5 % Place 1 patch onto the skin daily as needed (pain). Remove & Discard patch within 12 hours or as directed by MD   Yes [provider]  lisinopril (ZESTRIL) 40 MG tablet TAKE 1 TABLET (40 MG TOTAL) BY MOUTH DAILY. 04/14/19  Yes Leamon Arnt, MD  Melatonin 3 MG TABS Take 3 mg by mouth at bedtime.    Yes  [provider]  meloxicam (MOBIC) 15 MG tablet Take 1 tablet (15 mg total) by mouth daily as needed (arthritis pain (hands)). 09/30/18  Yes Leamon Arnt, MD  Multiple Vitamins-Minerals (MULTIVITAMIN WITH MINERALS) tablet Take 1 tablet by mouth daily.   Yes [provider]  Oxycodone HCl 10 MG TABS Take 1 tablet (10 mg total) by mouth every 4 (four) hours as needed. Take as needed for liver pain 04/25/19  Yes Ladell Pier, MD  Tetrahydrozoline HCl (VISINE OP) Place 1 drop into both eyes 3 (three) times daily as needed (dry eyes).   Yes [provider]  vitamin E (VITAMIN E) 180 MG (400 UNITS) capsule Take 400 Units by  mouth daily.   Yes [provider]  prochlorperazine (COMPAZINE) 10 MG tablet Take 1 tablet (10 mg total) by mouth every 6 (six) hours as needed for nausea. 04/29/19   Ladell Pier, MD    Allergies    Patient has no known allergies.  Review of Systems   Review of Systems  Constitutional: Positive for fever. Negative for chills.  HENT: Negative for ear pain and sore throat.   Eyes: Negative for pain and visual disturbance.  Respiratory: Positive for cough, shortness of breath and wheezing.   Cardiovascular: Negative for chest pain and palpitations.  Gastrointestinal: Positive for abdominal pain. Negative for vomiting.  Genitourinary: Negative for dysuria and hematuria.  Musculoskeletal: Negative for arthralgias and back pain.  Skin: Positive for rash. Negative for color change.  Neurological: Negative for seizures and syncope.  All other systems reviewed and are negative.   Physical Exam Updated Vital Signs  ED Triage Vitals  Enc Vitals Group     BP 05/02/19 0909 (!) 146/77     Pulse Rate 05/02/19 0909 80     Resp 05/02/19 0909 18     Temp 05/02/19 0909 99 F (37.2 C)     Temp Source 05/02/19 0909 Oral     SpO2 05/02/19 0909 94 %     Weight 05/02/19 0911 210 lb (95.3 kg)     Height 05/02/19 0911 6' (1.829 m)     Head  Circumference --      Peak Flow --      Pain Score 05/02/19 0916 4     Pain Loc --      Pain Edu? --      Excl. in [place]? --     Physical Exam Vitals and nursing note reviewed.  Constitutional:      General: He is not in acute distress.    Appearance: He is well-developed. He is not ill-appearing.  HENT:     Head: Normocephalic and atraumatic.  Eyes:     Conjunctiva/sclera: Conjunctivae normal.  Cardiovascular:     Rate and Rhythm: Normal rate and regular rhythm.     Heart sounds: No murmur.  Pulmonary:     Effort: Pulmonary effort is normal. No respiratory distress.     Breath sounds: Normal breath sounds.  Abdominal:     General: Abdomen is flat. There is distension.     Palpations: There is no shifting dullness or fluid wave.     Tenderness: There is generalized abdominal tenderness.  Genitourinary:    Testes:        Right: Swelling (1+) present.        Left: Swelling (1+) present.  Musculoskeletal:     Cervical back: Neck supple.  Skin:    General: Skin is warm and dry.     Capillary Refill: Capillary refill takes less than 2 seconds.  Neurological:     Mental Status: He is alert.     ED Results / Procedures / Treatments   Labs (all labs ordered are listed, but only abnormal results are displayed) Labs Reviewed  COMPREHENSIVE METABOLIC PANEL - Abnormal; Notable for the following components:      Result Value   Sodium 131 (*)    Chloride 95 (*)    Calcium 8.2 (*)    Albumin 2.7 (*)    AST 131 (*)    ALT 101 (*)    Alkaline Phosphatase 264 (*)    Total Bilirubin 1.6 (*)    All other components  within normal limits  CBC WITH DIFFERENTIAL/PLATELET - Abnormal; Notable for the following components:   WBC 13.5 (*)    RBC 6.41 (*)    MCV 79.7 (*)    MCH 24.0 (*)    RDW 18.8 (*)    Neutro Abs 9.9 (*)    Monocytes Absolute 2.3 (*)    Abs Immature Granulocytes 0.09 (*)    All other components within normal limits  CULTURE, BLOOD (ROUTINE X 2)  CULTURE, BLOOD  (ROUTINE X 2)  LIPASE, BLOOD  URINALYSIS, ROUTINE W REFLEX MICROSCOPIC  LACTIC ACID, PLASMA  PROTIME-INR  BRAIN NATRIURETIC PEPTIDE  LACTIC ACID, PLASMA  POC SARS CORONAVIRUS 2 AG -  ED  TROPONIN I (HIGH SENSITIVITY)    EKG None  Radiology DG Chest 2 View  Result Date: 05/02/2019 CLINICAL DATA:  Cough EXAM: CHEST - 2 VIEW COMPARISON:  04/10/2018 FINDINGS: Mild interstitial prominence with hazy patchy density. No significant pleural effusion. No pneumothorax. Stable cardiomediastinal contours with normal heart size. IMPRESSION: Mild interstitial prominence and hazy patchy atelectasis/consolidation may reflect mild edema or atypical pneumonia in the appropriate setting. Electronically Signed   By: Macy Mis M.D.   On: 05/02/2019 09:45    Procedures Procedures (including critical care time)  Medications Ordered in ED Medications  iohexol (OMNIPAQUE) 350 MG/ML injection 100 mL (has no administration in time range)  sodium chloride (PF) 0.9 % injection (has no administration in time range)  morphine 4 MG/ML injection 4 mg (4 mg Intravenous Given 05/02/19 1425)    ED Course  I have reviewed the triage vital signs and the nursing notes.  Pertinent labs & imaging results that were available during my care of the patient were reviewed by me and considered in my medical decision making (see chart for details).    MDM Rules/Calculators/A&P                      William Solis is a 69 year old male with history of liver cancer now on immunotherapy, bipolar who presents to the ED with shortness of breath, abdominal pain, fever.  Patient with temperature of 100.2 at home but 99 degrees here.  Otherwise unremarkable vitals.  Had first immunotherapy last week for liver cancer.  Patient with body aches, fever, shortness of breath and wheezing and diarrhea.  Mild rash to left upper arm.  Was told to come to the ED for evaluation.  Has overall clear breath sounds on exam.  Some mild  abdominal tenderness throughout but worse in the right upper quadrant.  Has some mild edema in his legs.  Will evaluate for infectious findings.  Could be immunotherapy side effect.  Chest x-ray has already been done that shows some atelectasis or mild edema, possibly atypical infection.  Patient is already vaccinated against coronavirus but will check point-of-care Covid test.  Likely will reach out to oncology team once work-up is completed.  We will get a CT scan of his chest to rule out PE/infectious process.  Will get a CT of his abdomen pelvis to look for any new intra-abdominal process.  Liver enzymes mildly elevated.  Lipase normal.  No significant electrolyte abnormality or kidney injury.  Troponin normal.  Lactic acid normal.  INR normal.  No urinary tract infection.  Awaiting CT scan of chest, abdomen and pelvis to evaluate further for infectious process, PE.  Will touch base with oncology.  Not sure if this is infectious process or side effect from immunotherapy.  Patient with  mildly elevated white count of 13.5.  Not neutropenic.  Point-of-care Covid test is negative.  Vital signs overall are stable.  CT scans are pending at this time.  Signed out oncoming ED staff.  Please see their note for further results, evaluation for disposition of the patient.  This chart was dictated using voice recognition software.  Despite best efforts to proofread,  errors can occur which can change the documentation meaning.    Final Clinical Impression(s) / ED Diagnoses Final diagnoses:  SOB (shortness of breath)    Rx / DC Orders ED Discharge Orders    None       Lennice Sites, DO 05/02/19 1452

## 2019-05-02 NOTE — Discharge Instructions (Addendum)
1.  Start Xarelto Dosepak as prescribed. 2.  Return to the emergency department if you have any worsening in your shortness of breath, lightheadedness, bleeding or other concerning symptoms. 3.  See Dr. Benay Spice for recheck this week. 4.  Do not take other medications in the class of nonsteroidal anti-inflammatories.  This includes naproxen, ibuprofen, Motrin, meloxicam.  Do not take daily aspirin. 5.  You may take Tylenol (acetaminophen), oxycodone and other pain medications that are not in the class of nonsteroidal anti-inflammatories. ___________________________________ Information on my medicine - XARELTO (rivaroxaban)  This medication education was reviewed with me or my healthcare representative as part of my discharge preparation.    WHY WAS XARELTO PRESCRIBED FOR YOU? Xarelto was prescribed to treat blood clots that may have been found in the veins of your legs (deep vein thrombosis) or in your lungs (pulmonary embolism) and to reduce the risk of them occurring again.  What do you need to know about Xarelto? The starting dose is one 15 mg tablet taken TWICE daily with food for the FIRST 21 DAYS then on (enter date)  05/24/2019  the dose is changed to one 20 mg tablet taken ONCE A DAY with your evening meal.  DO NOT stop taking Xarelto without talking to the health care provider who prescribed the medication.  Refill your prescription for 20 mg tablets before you run out.  After discharge, you should have regular check-up appointments with your healthcare provider that is prescribing your Xarelto.  In the future your dose may need to be changed if your kidney function changes by a significant amount.  What do you do if you miss a dose? If you are taking Xarelto TWICE DAILY and you miss a dose, take it as soon as you remember. You may take two 15 mg tablets (total 30 mg) at the same time then resume your regularly scheduled 15 mg twice daily the next day.  If you are taking Xarelto  ONCE DAILY and you miss a dose, take it as soon as you remember on the same day then continue your regularly scheduled once daily regimen the next day. Do not take two doses of Xarelto at the same time.   Important Safety Information Xarelto is a blood thinner medicine that can cause bleeding. You should call your healthcare provider right away if you experience any of the following: ? Bleeding from an injury or your nose that does not stop. ? Unusual colored urine (red or dark brown) or unusual colored stools (red or black). ? Unusual bruising for unknown reasons. ? A serious fall or if you hit your head (even if there is no bleeding).  Some medicines may interact with Xarelto and might increase your risk of bleeding while on Xarelto. To help avoid this, consult your healthcare provider or pharmacist prior to using any new prescription or non-prescription medications, including herbals, vitamins, non-steroidal anti-inflammatory drugs (NSAIDs) and supplements.  This website has more information on Xarelto: [email or web address]-[email or web address].[email or web address]

## 2019-05-02 NOTE — Progress Notes (Signed)
Patient's wife calls stating that over the weekend he developed wheezing, SOB and fever 100.2 and onset of diarrhea last night.  She called after hours nurse and was advised to take him to the emergency department.  She wanted to let us know they are the way there now.

## 2019-05-02 NOTE — ED Triage Notes (Signed)
Pt has liver cancer and had first immunotherapy infusion on Thursday. Started having wheezing, SOB. C/o abd pains with diarrhea since last night. This morning had fever 100.2 but didn't take any medications for it. Reports for a couple days had urinary urgency.

## 2019-05-05 ENCOUNTER — Telehealth: Payer: Self-pay | Admitting: Nurse Practitioner

## 2019-05-05 ENCOUNTER — Inpatient Hospital Stay (HOSPITAL_BASED_OUTPATIENT_CLINIC_OR_DEPARTMENT_OTHER): Payer: Medicare HMO | Admitting: Nurse Practitioner

## 2019-05-05 ENCOUNTER — Other Ambulatory Visit: Payer: Self-pay

## 2019-05-05 ENCOUNTER — Encounter: Payer: Self-pay | Admitting: Nurse Practitioner

## 2019-05-05 VITALS — BP 108/64 | HR 81 | Temp 97.3°F | Resp 18 | Ht 72.0 in | Wt 219.4 lb

## 2019-05-05 DIAGNOSIS — I10 Essential (primary) hypertension: Secondary | ICD-10-CM | POA: Diagnosis not present

## 2019-05-05 DIAGNOSIS — R0609 Other forms of dyspnea: Secondary | ICD-10-CM | POA: Diagnosis not present

## 2019-05-05 DIAGNOSIS — F909 Attention-deficit hyperactivity disorder, unspecified type: Secondary | ICD-10-CM | POA: Diagnosis not present

## 2019-05-05 DIAGNOSIS — Z5112 Encounter for antineoplastic immunotherapy: Secondary | ICD-10-CM | POA: Diagnosis not present

## 2019-05-05 DIAGNOSIS — Z8619 Personal history of other infectious and parasitic diseases: Secondary | ICD-10-CM | POA: Diagnosis not present

## 2019-05-05 DIAGNOSIS — Z7901 Long term (current) use of anticoagulants: Secondary | ICD-10-CM | POA: Diagnosis not present

## 2019-05-05 DIAGNOSIS — C220 Liver cell carcinoma: Secondary | ICD-10-CM | POA: Diagnosis not present

## 2019-05-05 DIAGNOSIS — F329 Major depressive disorder, single episode, unspecified: Secondary | ICD-10-CM | POA: Diagnosis not present

## 2019-05-05 MED ORDER — LEVALBUTEROL TARTRATE 45 MCG/ACT IN AERO: 1.0000 | INHALATION_SPRAY | Freq: Four times a day (QID) | RESPIRATORY_TRACT | 1 refills | Status: AC | PRN

## 2019-05-05 NOTE — Telephone Encounter (Signed)
Scheduled per los. Gave avs and calendar  

## 2019-05-05 NOTE — Progress Notes (Addendum)
[facility] OFFICE PROGRESS NOTE   Diagnosis: Hepatocellular carcinoma  INTERVAL HISTORY:   [name] returns as scheduled.  He completed cycle 1 atezolizumab/Avastin 04/28/2019.  He was seen in the emergency department with shortness of breath and worse abdominal pain.  Chest CT showed segmental pulmonary emboli within the right upper and left lower lobes, minimal clot burden and no evidence of right heart strain; trace right pleural effusion; minimal wedge-shaped subpleural consolidation right upper lobe possibly reflecting developing infarct; innumerable bilateral lung nodules worrisome for metastatic disease.  CT abdomen/pelvis showed stable multifocal hepatocellular carcinoma and stable portacaval adenopathy.  He was discharged home on Xarelto.  He reports some improvement in dyspnea since beginning Xarelto.  He continues to note wheezing.  He has been utilizing his wife's levalbuterol inhaler.  He denies bleeding.  On 05/01/2019 he had 3 loose stools and temperature 100.2.  He is no longer having loose stools.  No nausea or vomiting.  His weight has increased, appetite remains poor.  He denies leg swelling.  He feels his abdomen is more distended.  Objective:  Vital signs in last 24 hours:  Blood pressure 108/64, pulse 81, temperature (!) 97.3 F (36.3 C), temperature source Temporal, resp. rate 18, height 6' (1.829 m), weight 219 lb 6.4 oz (99.5 kg), SpO2 95 %.    Resp: Distant breath sounds.  No respiratory distress. Cardio: Regular rate and rhythm. GI: Liver palpable right upper abdomen.  Abdomen appears distended. Vascular: No leg edema. Neuro: Alert and oriented. Skin: No rash.   Lab Results:  Lab Results  Component Value Date   WBC 13.5 (H) 05/02/2019   HGB 15.4 05/02/2019   HCT 51.1 05/02/2019   MCV 79.7 (L) 05/02/2019   PLT 158 05/02/2019   NEUTROABS 9.9 (H) 05/02/2019    Imaging:  No results found.  Medications: I have reviewed the patient's  current medications.  Assessment/Plan: 1. Hepatocellular carcinoma  Ultrasound abdomen 04/11/1999-2 solid hepatic masses, largest measuring 15 cm  MRI liver 04/12/2019-cirrhosis, dominant central hepatic mass, dominant segment 6 mass, several small enhancing lesions in the bilateral liver, imaging features consistent with LR-5 criteria  Cycle 1 atezolizumab/Avastin 04/28/2019  CT chest 05/02/2019-segmental pulmonary emboli within the right upper and left lower lobes.  Innumerable bilateral pulmonary nodules. 2. History of hepatitis C, treated in 2019 3. Depression 4. ADHD 5. Lower extremity neuropathy 6. Hypertension 7. Status post partial left foot amputation 8. Chest CT 05/02/2019-segmental pulmonary emboli within the right upper and left lower lobes, minimal clot burden no evidence of right heart strain. Started on Xarelto.  Disposition: Mr. Priebe appears unchanged.  He completed cycle 1 atezolizumab/Avastin 04/28/2019.  He will return for cycle 2 on 05/19/2019.  He was seen in the emergency department on 05/02/2019 with shortness of breath.  He was found to have pulmonary emboli, bilateral lung nodules.  He has been started on Xarelto.  He and his wife understand the lung nodules are likely metastatic disease.  He will return for lab, follow-up, cycle 2 atezolizumab/Avastin in 2 weeks.  He will contact the office in the interim with any problems.  Patient seen with Dr. Benay Spice.  CT images reviewed on the computer with Mr. Marchiano and his wife.    Ned Card ANP/GNP-BC   05/05/2019  3:26 PM  This was a shared visit with Ned Card.  Mr. Hallstrom completed the first cycle of atezolizumab/Avastin.  He tolerated treatment well.  He was diagnosed with pulmonary emboli and lung nodules when  he was seen in the emergency room on 05/02/2019.  He will continue anticoagulation therapy.  He will use an inhaler as needed for wheezing.  Julieanne Manson, MD

## 2019-05-07 LAB — CULTURE, BLOOD (ROUTINE X 2)
Culture: NO GROWTH
Culture: NO GROWTH
Special Requests: ADEQUATE

## 2019-05-12 ENCOUNTER — Other Ambulatory Visit: Payer: Self-pay | Admitting: Oncology

## 2019-05-13 NOTE — Progress Notes (Signed)
Pharmacist Chemotherapy Monitoring - Follow Up Assessment    I verify that I have reviewed each item in the below checklist:  . Regimen for the patient is scheduled for the appropriate day and plan matches scheduled date. Marland Kitchen Appropriate non-routine labs are ordered dependent on drug ordered. . If applicable, additional medications reviewed and ordered per protocol based on lifetime cumulative doses and/or treatment regimen.   Plan for follow-up and/or issues identified: No . I-vent associated with next due treatment: No . MD and/or nursing notified: No   Kennith Center, Pharm.D., CPP 05/13/2019@4 :27 PM

## 2019-05-19 ENCOUNTER — Encounter: Payer: Self-pay | Admitting: Nurse Practitioner

## 2019-05-19 ENCOUNTER — Inpatient Hospital Stay: Payer: Medicare HMO

## 2019-05-19 ENCOUNTER — Telehealth: Payer: Self-pay | Admitting: Nurse Practitioner

## 2019-05-19 ENCOUNTER — Other Ambulatory Visit: Payer: Self-pay

## 2019-05-19 ENCOUNTER — Inpatient Hospital Stay (HOSPITAL_BASED_OUTPATIENT_CLINIC_OR_DEPARTMENT_OTHER): Payer: Medicare HMO | Admitting: Nurse Practitioner

## 2019-05-19 VITALS — BP 124/72 | HR 86 | Temp 98.9°F | Resp 18 | Ht 72.0 in | Wt 211.3 lb

## 2019-05-19 DIAGNOSIS — R0609 Other forms of dyspnea: Secondary | ICD-10-CM | POA: Diagnosis not present

## 2019-05-19 DIAGNOSIS — C220 Liver cell carcinoma: Secondary | ICD-10-CM

## 2019-05-19 DIAGNOSIS — Z7901 Long term (current) use of anticoagulants: Secondary | ICD-10-CM | POA: Diagnosis not present

## 2019-05-19 DIAGNOSIS — F329 Major depressive disorder, single episode, unspecified: Secondary | ICD-10-CM | POA: Diagnosis not present

## 2019-05-19 DIAGNOSIS — F909 Attention-deficit hyperactivity disorder, unspecified type: Secondary | ICD-10-CM | POA: Diagnosis not present

## 2019-05-19 DIAGNOSIS — Z5112 Encounter for antineoplastic immunotherapy: Secondary | ICD-10-CM | POA: Diagnosis not present

## 2019-05-19 DIAGNOSIS — Z8619 Personal history of other infectious and parasitic diseases: Secondary | ICD-10-CM | POA: Diagnosis not present

## 2019-05-19 DIAGNOSIS — I10 Essential (primary) hypertension: Secondary | ICD-10-CM | POA: Diagnosis not present

## 2019-05-19 LAB — CMP (CANCER CENTER ONLY)
ALT: 90 U/L — ABNORMAL HIGH (ref 0–44)
AST: 116 U/L — ABNORMAL HIGH (ref 15–41)
Albumin: 2.3 g/dL — ABNORMAL LOW (ref 3.5–5.0)
Alkaline Phosphatase: 257 U/L — ABNORMAL HIGH (ref 38–126)
Anion gap: 8 (ref 5–15)
BUN: 11 mg/dL (ref 8–23)
CO2: 25 mmol/L (ref 22–32)
Calcium: 8.6 mg/dL — ABNORMAL LOW (ref 8.9–10.3)
Chloride: 98 mmol/L (ref 98–111)
Creatinine: 0.75 mg/dL (ref 0.61–1.24)
GFR, Est AFR Am: >60 mL/min (ref >60)
GFR, Estimated: >60 mL/min (ref >60)
Glucose, Bld: 95 mg/dL (ref 70–99)
Potassium: 4.4 mmol/L (ref 3.5–5.1)
Sodium: 131 mmol/L — ABNORMAL LOW (ref 135–145)
Total Bilirubin: 1.1 mg/dL (ref 0.3–1.2)
Total Protein: 7.8 g/dL (ref 6.5–8.1)

## 2019-05-19 LAB — CBC WITH DIFFERENTIAL (CANCER CENTER ONLY)
Abs Immature Granulocytes: 0.08 10*3/uL — ABNORMAL HIGH (ref 0.00–0.07)
Basophils Absolute: 0.1 10*3/uL (ref 0.0–0.1)
Basophils Relative: 1 %
Eosinophils Absolute: 0 10*3/uL (ref 0.0–0.5)
Eosinophils Relative: 0 %
HCT: 49.8 % (ref 39.0–52.0)
Hemoglobin: 15.3 g/dL (ref 13.0–17.0)
Immature Granulocytes: 1 %
Lymphocytes Relative: 8 %
Lymphs Abs: 1.1 10*3/uL (ref 0.7–4.0)
MCH: 24.5 pg — ABNORMAL LOW (ref 26.0–34.0)
MCHC: 30.7 g/dL (ref 30.0–36.0)
MCV: 79.7 fL — ABNORMAL LOW (ref 80.0–100.0)
Monocytes Absolute: 2 10*3/uL — ABNORMAL HIGH (ref 0.1–1.0)
Monocytes Relative: 15 %
Neutro Abs: 10.8 10*3/uL — ABNORMAL HIGH (ref 1.7–7.7)
Neutrophils Relative %: 75 %
Platelet Count: 156 10*3/uL (ref 150–400)
RBC: 6.25 MIL/uL — ABNORMAL HIGH (ref 4.22–5.81)
RDW: 19.9 % — ABNORMAL HIGH (ref 11.5–15.5)
WBC Count: 14 10*3/uL — ABNORMAL HIGH (ref 4.0–10.5)
nRBC: 0 % (ref 0.0–0.2)

## 2019-05-19 MED ORDER — RIVAROXABAN 20 MG PO TABS: 20.0000 mg | ORAL_TABLET | Freq: Every day | ORAL | 2 refills | Status: DC

## 2019-05-19 MED ORDER — OXYCODONE HCL 10 MG PO TABS: 10.0000 mg | ORAL_TABLET | ORAL | 0 refills | Status: DC | PRN

## 2019-05-19 MED ORDER — SODIUM CHLORIDE 0.9 % IV SOLN
1200.0000 mg | Freq: Once | INTRAVENOUS | Status: AC
  Administered 2019-05-19: 14:00:00 1200 mg via INTRAVENOUS
  Filled 2019-05-19: qty 20

## 2019-05-19 MED ORDER — SODIUM CHLORIDE 0.9 % IV SOLN
15.0000 mg/kg | Freq: Once | INTRAVENOUS | Status: AC
  Administered 2019-05-19: 15:00:00 1400 mg via INTRAVENOUS
  Filled 2019-05-19: qty 48

## 2019-05-19 MED ORDER — SODIUM CHLORIDE 0.9 % IV SOLN
Freq: Once | INTRAVENOUS | Status: AC
  Filled 2019-05-19: qty 250

## 2019-05-19 NOTE — Progress Notes (Signed)
Per Dr. Benay Spice: OK to treat w/elevated AST/ALT--improved.

## 2019-05-19 NOTE — Progress Notes (Addendum)
  [facility] OFFICE PROGRESS NOTE   Diagnosis: Hepatocellular carcinoma  INTERVAL HISTORY:   William Solis returns as scheduled.  He completed cycle 1 atezolizumab/Avastin 04/28/2019.  No diarrhea or rash.  Some blood with nose blowing.  Occasional mild nosebleed.  Abdominal pain varies.  He takes oxycodone as needed.  Bowels moving regularly.  He continues to note dyspnea on exertion and wheezing.  The wheezing improves with use of the inhaler we prescribed last visit.  Objective:  Vital signs in last 24 hours:  Blood pressure 124/72, pulse 86, temperature 98.9 F (37.2 C), temperature source Temporal, resp. rate 18, height 6' (1.829 m), weight 211 lb 4.8 oz (95.8 kg), SpO2 95 %.    HEENT: No thrush or ulcers. Resp: Faint rales at both lung bases.  No wheezes.  Cardio: Regular rate and rhythm. GI: Abdomen is distended.  Liver palpable right upper abdomen. Vascular: No leg edema. Neuro: Alert and oriented. Skin: No rash.   Lab Results:  Lab Results  Component Value Date   WBC 14.0 (H) 05/19/2019   HGB 15.3 05/19/2019   HCT 49.8 05/19/2019   MCV 79.7 (L) 05/19/2019   PLT 156 05/19/2019   NEUTROABS 10.8 (H) 05/19/2019    Imaging:  No results found.  Medications: I have reviewed the patient's current medications.  Assessment/Plan: 1. Hepatocellular carcinoma  Ultrasound abdomen 04/11/1999-2 solid hepatic masses, largest measuring 15 cm  MRI liver 04/12/2019-cirrhosis, dominant central hepatic mass, dominant segment 6 mass, several small enhancing lesions in the bilateral liver, imaging features consistent with LR-5 criteria  Cycle 1 atezolizumab/Avastin 04/28/2019  CT chest 05/02/2019-segmental pulmonary emboli within the right upper and left lower lobes.  Innumerable bilateral pulmonary nodules.  Cycle 2 atezolizumab/Avastin 05/19/2019 2. History of hepatitis C, treatedin 2019 3. Depression 4. ADHD 5. Lower extremity neuropathy 6. Hypertension 7.  Status post partial left foot amputation 8. Chest CT 05/02/2019-segmental pulmonary emboli within the right upper and left lower lobes, minimal clot burden no evidence of right heart strain. Started on Xarelto.  Disposition: William Solis appears stable.  He has completed 1 cycle of atezolizumab/Avastin.  Plan to proceed with cycle 2 today as scheduled.  We reviewed the CBC and chemistry panel from today.  Labs adequate to proceed with treatment.  Liver enzymes with stable elevation.  He will return for lab, follow-up, cycle 3 atezolizumab/Avastin in 3 weeks.  He will contact the office in the interim with any problems.  Patient seen with Dr. Benay Spice.    Ned Card ANP/GNP-BC   05/19/2019  12:26 PM This was a shared visit with Ned Card.  William Solis tolerated the first cycle of atezolizumab/Avastin well.  He will complete cycle 2 today.  Julieanne Manson, MD

## 2019-05-19 NOTE — Telephone Encounter (Signed)
Scheduled appt per 4/29 los.  Printed calendar per pt request

## 2019-05-19 NOTE — Patient Instructions (Signed)
[facility] Discharge Instructions for Patients Receiving Chemotherapy  Today you received the following chemotherapy agents Tecentriq and Bevacuzimab.  To help prevent nausea and vomiting after your treatment, we encourage you to take your nausea medication as directed.   If you develop nausea and vomiting that is not controlled by your nausea medication, call the clinic.   BELOW ARE SYMPTOMS THAT SHOULD BE REPORTED IMMEDIATELY:  *FEVER GREATER THAN 100.5 F  *CHILLS WITH OR WITHOUT FEVER  NAUSEA AND VOMITING THAT IS NOT CONTROLLED WITH YOUR NAUSEA MEDICATION  *UNUSUAL SHORTNESS OF BREATH  *UNUSUAL BRUISING OR BLEEDING  TENDERNESS IN MOUTH AND THROAT WITH OR WITHOUT PRESENCE OF ULCERS  *URINARY PROBLEMS  *BOWEL PROBLEMS  UNUSUAL RASH Items with * indicate a potential emergency and should be followed up as soon as possible.  Feel free to call the clinic you have any questions or concerns. The clinic phone number is [phone].  Please show the [place] at check-in to the Emergency Department and triage nurse.

## 2019-06-03 NOTE — Progress Notes (Signed)
Pharmacist Chemotherapy Monitoring - Follow Up Assessment    I verify that I have reviewed each item in the below checklist:  . Regimen for the patient is scheduled for the appropriate day and plan matches scheduled date. Marland Kitchen Appropriate non-routine labs are ordered dependent on drug ordered. . If applicable, additional medications reviewed and ordered per protocol based on lifetime cumulative doses and/or treatment regimen.   Plan for follow-up and/or issues identified: No . I-vent associated with next due treatment: No . MD and/or nursing notified: No   Kennith Center, Pharm.D., CPP 06/03/2019@12 :44 PM

## 2019-06-05 ENCOUNTER — Other Ambulatory Visit: Payer: Self-pay | Admitting: Oncology

## 2019-06-09 ENCOUNTER — Telehealth: Payer: Self-pay | Admitting: Nurse Practitioner

## 2019-06-09 ENCOUNTER — Inpatient Hospital Stay: Payer: Medicare HMO

## 2019-06-09 ENCOUNTER — Inpatient Hospital Stay: Payer: Medicare HMO | Admitting: Nurse Practitioner

## 2019-06-09 ENCOUNTER — Inpatient Hospital Stay: Payer: Medicare HMO | Attending: Nurse Practitioner

## 2019-06-09 ENCOUNTER — Encounter: Payer: Self-pay | Admitting: Nurse Practitioner

## 2019-06-09 ENCOUNTER — Other Ambulatory Visit: Payer: Self-pay

## 2019-06-09 VITALS — HR 76 | Temp 97.7°F | Resp 18 | Ht 72.0 in | Wt 202.1 lb

## 2019-06-09 DIAGNOSIS — Z86711 Personal history of pulmonary embolism: Secondary | ICD-10-CM | POA: Diagnosis not present

## 2019-06-09 DIAGNOSIS — F329 Major depressive disorder, single episode, unspecified: Secondary | ICD-10-CM | POA: Insufficient documentation

## 2019-06-09 DIAGNOSIS — I10 Essential (primary) hypertension: Secondary | ICD-10-CM | POA: Insufficient documentation

## 2019-06-09 DIAGNOSIS — C220 Liver cell carcinoma: Secondary | ICD-10-CM

## 2019-06-09 DIAGNOSIS — Z7901 Long term (current) use of anticoagulants: Secondary | ICD-10-CM | POA: Diagnosis not present

## 2019-06-09 DIAGNOSIS — Z79899 Other long term (current) drug therapy: Secondary | ICD-10-CM | POA: Diagnosis not present

## 2019-06-09 DIAGNOSIS — F909 Attention-deficit hyperactivity disorder, unspecified type: Secondary | ICD-10-CM | POA: Insufficient documentation

## 2019-06-09 DIAGNOSIS — Z5112 Encounter for antineoplastic immunotherapy: Secondary | ICD-10-CM | POA: Diagnosis not present

## 2019-06-09 LAB — CMP (CANCER CENTER ONLY)
ALT: 66 U/L — ABNORMAL HIGH (ref 0–44)
AST: 101 U/L — ABNORMAL HIGH (ref 15–41)
Albumin: 2.2 g/dL — ABNORMAL LOW (ref 3.5–5.0)
Alkaline Phosphatase: 221 U/L — ABNORMAL HIGH (ref 38–126)
Anion gap: 10 (ref 5–15)
BUN: 16 mg/dL (ref 8–23)
CO2: 25 mmol/L (ref 22–32)
Calcium: 8.6 mg/dL — ABNORMAL LOW (ref 8.9–10.3)
Chloride: 95 mmol/L — ABNORMAL LOW (ref 98–111)
Creatinine: 0.79 mg/dL (ref 0.61–1.24)
GFR, Est AFR Am: >60 mL/min (ref >60)
GFR, Estimated: >60 mL/min (ref >60)
Glucose, Bld: 92 mg/dL (ref 70–99)
Potassium: 4.1 mmol/L (ref 3.5–5.1)
Sodium: 130 mmol/L — ABNORMAL LOW (ref 135–145)
Total Bilirubin: 1.1 mg/dL (ref 0.3–1.2)
Total Protein: 7.5 g/dL (ref 6.5–8.1)

## 2019-06-09 LAB — CBC WITH DIFFERENTIAL (CANCER CENTER ONLY)
Abs Immature Granulocytes: 0.04 10*3/uL (ref 0.00–0.07)
Basophils Absolute: 0.1 10*3/uL (ref 0.0–0.1)
Basophils Relative: 1 %
Eosinophils Absolute: 0.1 10*3/uL (ref 0.0–0.5)
Eosinophils Relative: 1 %
HCT: 49 % (ref 39.0–52.0)
Hemoglobin: 14.9 g/dL (ref 13.0–17.0)
Immature Granulocytes: 0 %
Lymphocytes Relative: 10 %
Lymphs Abs: 1.1 10*3/uL (ref 0.7–4.0)
MCH: 24.1 pg — ABNORMAL LOW (ref 26.0–34.0)
MCHC: 30.4 g/dL (ref 30.0–36.0)
MCV: 79.2 fL — ABNORMAL LOW (ref 80.0–100.0)
Monocytes Absolute: 1.7 10*3/uL — ABNORMAL HIGH (ref 0.1–1.0)
Monocytes Relative: 15 %
Neutro Abs: 8 10*3/uL — ABNORMAL HIGH (ref 1.7–7.7)
Neutrophils Relative %: 73 %
Platelet Count: 156 10*3/uL (ref 150–400)
RBC: 6.19 MIL/uL — ABNORMAL HIGH (ref 4.22–5.81)
RDW: 20.1 % — ABNORMAL HIGH (ref 11.5–15.5)
WBC Count: 10.9 10*3/uL — ABNORMAL HIGH (ref 4.0–10.5)
nRBC: 0 % (ref 0.0–0.2)

## 2019-06-09 LAB — TSH: TSH: 3.076 u[IU]/mL (ref 0.320–4.118)

## 2019-06-09 LAB — TOTAL PROTEIN, URINE DIPSTICK: Protein, ur: NEGATIVE mg/dL

## 2019-06-09 MED ORDER — SODIUM CHLORIDE 0.9 % IV SOLN
1200.0000 mg | Freq: Once | INTRAVENOUS | Status: AC
  Administered 2019-06-09: 1200 mg via INTRAVENOUS
  Filled 2019-06-09: qty 20

## 2019-06-09 MED ORDER — SODIUM CHLORIDE 0.9 % IV SOLN
Freq: Once | INTRAVENOUS | Status: AC
  Filled 2019-06-09: qty 250

## 2019-06-09 MED ORDER — SODIUM CHLORIDE 0.9 % IV SOLN
15.0000 mg/kg | Freq: Once | INTRAVENOUS | Status: AC
  Administered 2019-06-09: 1400 mg via INTRAVENOUS
  Filled 2019-06-09: qty 48

## 2019-06-09 MED ORDER — OXYCODONE HCL 10 MG PO TABS: 10.0000 mg | ORAL_TABLET | ORAL | 0 refills | Status: DC | PRN

## 2019-06-09 NOTE — Progress Notes (Signed)
Okay to treat with urine protein for 4/8 per Ned Card NP

## 2019-06-09 NOTE — Progress Notes (Addendum)
  [facility] OFFICE PROGRESS NOTE   Diagnosis: Hepatocellular carcinoma  INTERVAL HISTORY:   William Solis returns as scheduled.  He completed cycle 2 atezolizumab/Avastin 05/19/2019.  He continues to have pain at the right abdomen.  The pain is intermittently "sharp".  Energy level is poor.  Wife notes that he "sleeps a lot".  Some memory difficulty.  He reports he has "chronic depression", takes Celexa 20 mg daily.  He thinks his appetite is a little better.  He has lost weight.  No rash or diarrhea.  Objective:  Vital signs in last 24 hours:  Pulse 76, temperature 97.7 F (36.5 C), temperature source Temporal, resp. rate 18, height 6' (1.829 m), weight 202 lb 1.6 oz (91.7 kg), SpO2 96 %.    HEENT: No thrush or ulcers. Resp: Rales at both lung bases.  No wheezing.  No respiratory distress. Cardio: Regular rate and rhythm. GI: Liver palpable right abdomen. Vascular: No leg edema. Neuro: Alert and oriented. Skin: No rash.   Lab Results:  Lab Results  Component Value Date   WBC 10.9 (H) 06/09/2019   HGB 14.9 06/09/2019   HCT 49.0 06/09/2019   MCV 79.2 (L) 06/09/2019   PLT 156 06/09/2019   NEUTROABS 8.0 (H) 06/09/2019    Imaging:  No results found.  Medications: I have reviewed the patient's current medications.  Assessment/Plan: 1. Hepatocellular carcinoma  Ultrasound abdomen 04/11/1999-2 solid hepatic masses, largest measuring 15 cm  MRI liver 04/12/2019-cirrhosis, dominant central hepatic mass, dominant segment 6 mass, several small enhancing lesions in the bilateral liver, imaging features consistent with LR-5 criteria  Cycle 1 atezolizumab/Avastin 04/28/2019  CT chest 05/02/2019-segmental pulmonary emboli within the right upper and left lower lobes. Innumerable bilateral pulmonary nodules.  CT abdomen/pelvis 05/02/2019-stable multifocal hepatocellular carcinoma, stable portacaval adenopathy  Cycle 2 atezolizumab/Avastin 05/19/2019  Cycle 3  atezolizumab/Avastin 06/09/2019 2. History of hepatitis C, treatedin 2019 3. Depression 4. ADHD 5. Lower extremity neuropathy 6. Hypertension 7. Status post partial left foot amputation 8. Chest CT 05/02/2019-segmental pulmonary emboli within the right upper and left lower lobes, minimal clot burden no evidence of right heart strain. Started on Xarelto  Disposition: William Solis appears stable.  He has completed 2 cycles of atezolizumab/Avastin.  Plan to proceed with cycle 3 today as scheduled.  We will follow-up on the AFP from today.  We are referring him for restaging CT scans prior to his next visit.  We reviewed the CBC and chemistry panel from today.  Labs adequate to proceed as above.  He will return for lab, follow-up, atezolizumab/Avastin in 3 weeks.  He will contact the office in the interim with any problems.  Patient seen with Dr. Benay Spice.  Ned Card ANP/GNP-BC   06/09/2019  12:47 PM This was a shared visit with Ned Card.  William Solis was interviewed and examined.  He appears unchanged.  He has tolerated the atezolizumab/Avastin well.  He will complete another cycle today.  We discussed the treatment plan with William Solis and his wife.  He will undergo a restaging CT evaluation after this cycle.  Julieanne Manson, MD

## 2019-06-09 NOTE — Telephone Encounter (Signed)
Pt called in for appts per 5/20 los- scheduled and pt wife is aware of appts.

## 2019-06-09 NOTE — Patient Instructions (Signed)
[facility] Discharge Instructions for Patients Receiving Chemotherapy  Today you received the following chemotherapy agents: Tecentriq, Bevacizumab  To help prevent nausea and vomiting after your treatment, we encourage you to take your nausea medication as directed.   If you develop nausea and vomiting that is not controlled by your nausea medication, call the clinic.   BELOW ARE SYMPTOMS THAT SHOULD BE REPORTED IMMEDIATELY:  *FEVER GREATER THAN 100.5 F  *CHILLS WITH OR WITHOUT FEVER  NAUSEA AND VOMITING THAT IS NOT CONTROLLED WITH YOUR NAUSEA MEDICATION  *UNUSUAL SHORTNESS OF BREATH  *UNUSUAL BRUISING OR BLEEDING  TENDERNESS IN MOUTH AND THROAT WITH OR WITHOUT PRESENCE OF ULCERS  *URINARY PROBLEMS  *BOWEL PROBLEMS  UNUSUAL RASH Items with * indicate a potential emergency and should be followed up as soon as possible.  Feel free to call the clinic should you have any questions or concerns. The clinic phone number is [phone].  Please show the [facility] at check-in to the Emergency Department and triage nurse.

## 2019-06-10 LAB — AFP TUMOR MARKER: AFP, Serum, Tumor Marker: 10543 ng/mL — ABNORMAL HIGH (ref 0.0–8.3)

## 2019-06-23 NOTE — Progress Notes (Signed)
Pharmacist Chemotherapy Monitoring - Follow Up Assessment    I verify that I have reviewed each item in the below checklist:  . Regimen for the patient is scheduled for the appropriate day and plan matches scheduled date. Marland Kitchen Appropriate non-routine labs are ordered dependent on drug ordered. . If applicable, additional medications reviewed and ordered per protocol based on lifetime cumulative doses and/or treatment regimen.   Plan for follow-up and/or issues identified: No . I-vent associated with next due treatment: No . MD and/or nursing notified: No  Britt Boozer 06/23/2019 8:17 AM

## 2019-06-26 ENCOUNTER — Other Ambulatory Visit: Payer: Self-pay | Admitting: Oncology

## 2019-06-27 ENCOUNTER — Inpatient Hospital Stay: Payer: Medicare HMO

## 2019-06-28 ENCOUNTER — Ambulatory Visit (HOSPITAL_COMMUNITY)
Admission: RE | Admit: 2019-06-28 | Discharge: 2019-06-28 | Disposition: A | Discharge status: Home / Self Care | Payer: Medicare HMO | Source: Ambulatory Visit | Attending: Nurse Practitioner | Admitting: Nurse Practitioner

## 2019-06-28 ENCOUNTER — Encounter (HOSPITAL_COMMUNITY): Payer: Self-pay

## 2019-06-28 ENCOUNTER — Inpatient Hospital Stay: Payer: Medicare HMO | Attending: Nurse Practitioner

## 2019-06-28 ENCOUNTER — Other Ambulatory Visit: Payer: Self-pay

## 2019-06-28 DIAGNOSIS — Z86711 Personal history of pulmonary embolism: Secondary | ICD-10-CM | POA: Diagnosis not present

## 2019-06-28 DIAGNOSIS — F909 Attention-deficit hyperactivity disorder, unspecified type: Secondary | ICD-10-CM | POA: Diagnosis not present

## 2019-06-28 DIAGNOSIS — F329 Major depressive disorder, single episode, unspecified: Secondary | ICD-10-CM | POA: Insufficient documentation

## 2019-06-28 DIAGNOSIS — Z7901 Long term (current) use of anticoagulants: Secondary | ICD-10-CM | POA: Insufficient documentation

## 2019-06-28 DIAGNOSIS — Z9221 Personal history of antineoplastic chemotherapy: Secondary | ICD-10-CM | POA: Diagnosis not present

## 2019-06-28 DIAGNOSIS — C220 Liver cell carcinoma: Secondary | ICD-10-CM | POA: Insufficient documentation

## 2019-06-28 DIAGNOSIS — I10 Essential (primary) hypertension: Secondary | ICD-10-CM | POA: Diagnosis not present

## 2019-06-28 LAB — CMP (CANCER CENTER ONLY)
ALT: 80 U/L — ABNORMAL HIGH (ref 0–44)
AST: 129 U/L — ABNORMAL HIGH (ref 15–41)
Albumin: 2.4 g/dL — ABNORMAL LOW (ref 3.5–5.0)
Alkaline Phosphatase: 284 U/L — ABNORMAL HIGH (ref 38–126)
Anion gap: 15 (ref 5–15)
BUN: 12 mg/dL (ref 8–23)
CO2: 23 mmol/L (ref 22–32)
Calcium: 9 mg/dL (ref 8.9–10.3)
Chloride: 95 mmol/L — ABNORMAL LOW (ref 98–111)
Creatinine: 0.78 mg/dL (ref 0.61–1.24)
GFR, Est AFR Am: >60 mL/min (ref >60)
GFR, Estimated: >60 mL/min (ref >60)
Glucose, Bld: 91 mg/dL (ref 70–99)
Potassium: 3.9 mmol/L (ref 3.5–5.1)
Sodium: 133 mmol/L — ABNORMAL LOW (ref 135–145)
Total Bilirubin: 1.6 mg/dL — ABNORMAL HIGH (ref 0.3–1.2)
Total Protein: 7.9 g/dL (ref 6.5–8.1)

## 2019-06-28 LAB — CBC WITH DIFFERENTIAL (CANCER CENTER ONLY)
Abs Immature Granulocytes: 0.11 10*3/uL — ABNORMAL HIGH (ref 0.00–0.07)
Basophils Absolute: 0.1 10*3/uL (ref 0.0–0.1)
Basophils Relative: 1 %
Eosinophils Absolute: 0.2 10*3/uL (ref 0.0–0.5)
Eosinophils Relative: 2 %
HCT: 52.4 % — ABNORMAL HIGH (ref 39.0–52.0)
Hemoglobin: 16 g/dL (ref 13.0–17.0)
Immature Granulocytes: 1 %
Lymphocytes Relative: 9 %
Lymphs Abs: 1 10*3/uL (ref 0.7–4.0)
MCH: 24 pg — ABNORMAL LOW (ref 26.0–34.0)
MCHC: 30.5 g/dL (ref 30.0–36.0)
MCV: 78.7 fL — ABNORMAL LOW (ref 80.0–100.0)
Monocytes Absolute: 1.7 10*3/uL — ABNORMAL HIGH (ref 0.1–1.0)
Monocytes Relative: 14 %
Neutro Abs: 8.5 10*3/uL — ABNORMAL HIGH (ref 1.7–7.7)
Neutrophils Relative %: 73 %
Platelet Count: 121 10*3/uL — ABNORMAL LOW (ref 150–400)
RBC: 6.66 MIL/uL — ABNORMAL HIGH (ref 4.22–5.81)
RDW: 20.4 % — ABNORMAL HIGH (ref 11.5–15.5)
WBC Count: 11.6 10*3/uL — ABNORMAL HIGH (ref 4.0–10.5)
nRBC: 0 % (ref 0.0–0.2)

## 2019-06-28 MED ORDER — IOHEXOL 300 MG/ML  SOLN
100.0000 mL | Freq: Once | INTRAMUSCULAR | Status: AC | PRN
  Administered 2019-06-28: 100 mL via INTRAVENOUS

## 2019-06-28 MED ORDER — SODIUM CHLORIDE (PF) 0.9 % IJ SOLN
INTRAMUSCULAR | Status: AC
  Filled 2019-06-28: qty 50

## 2019-06-29 ENCOUNTER — Inpatient Hospital Stay: Payer: Medicare HMO | Admitting: Nurse Practitioner

## 2019-06-29 ENCOUNTER — Other Ambulatory Visit: Payer: Self-pay

## 2019-06-29 ENCOUNTER — Inpatient Hospital Stay: Payer: Medicare HMO

## 2019-06-29 ENCOUNTER — Encounter: Payer: Self-pay | Admitting: Nurse Practitioner

## 2019-06-29 ENCOUNTER — Telehealth: Payer: Self-pay | Admitting: Oncology

## 2019-06-29 VITALS — BP 134/78 | HR 74 | Temp 98.1°F | Resp 18 | Ht 72.0 in | Wt 194.1 lb

## 2019-06-29 DIAGNOSIS — C220 Liver cell carcinoma: Secondary | ICD-10-CM

## 2019-06-29 DIAGNOSIS — Z7901 Long term (current) use of anticoagulants: Secondary | ICD-10-CM | POA: Diagnosis not present

## 2019-06-29 DIAGNOSIS — Z9221 Personal history of antineoplastic chemotherapy: Secondary | ICD-10-CM | POA: Diagnosis not present

## 2019-06-29 DIAGNOSIS — F329 Major depressive disorder, single episode, unspecified: Secondary | ICD-10-CM | POA: Diagnosis not present

## 2019-06-29 DIAGNOSIS — F909 Attention-deficit hyperactivity disorder, unspecified type: Secondary | ICD-10-CM | POA: Diagnosis not present

## 2019-06-29 DIAGNOSIS — Z86711 Personal history of pulmonary embolism: Secondary | ICD-10-CM | POA: Diagnosis not present

## 2019-06-29 DIAGNOSIS — I10 Essential (primary) hypertension: Secondary | ICD-10-CM | POA: Diagnosis not present

## 2019-06-29 MED ORDER — OXYCODONE HCL 10 MG PO TABS: 10.0000 mg | ORAL_TABLET | ORAL | 0 refills | Status: DC | PRN

## 2019-06-29 NOTE — Progress Notes (Signed)
Sent email to [facility] Per Ned Card NP to get patients scans pushed to [facility]

## 2019-06-29 NOTE — Progress Notes (Addendum)
[facility] OFFICE PROGRESS NOTE   Diagnosis: Hepatocellular carcinoma  INTERVAL HISTORY:   William Solis returns as scheduled.  He completed cycle 3 atezolizumab/Avastin 06/09/2019.  He has periodic nausea/vomiting.  No diarrhea.  No rash.  Appetite remains poor.  No leg swelling.  Pain is unchanged.  He has labored breathing at times, periodic cough.  Blood with nose blowing.  Objective:  Vital signs in last 24 hours:  Blood pressure 134/78, pulse 74, temperature 98.1 F (36.7 C), temperature source Temporal, resp. rate 18, height 6' (1.829 m), weight 194 lb 1.6 oz (88 kg), SpO2 94 %.    Resp: Rales at both lung bases.  No respiratory distress. Cardio: Regular rate and rhythm. GI: Liver palpable throughout the right upper abdomen. Vascular: No leg edema.  Skin: No rash.   Lab Results:  Lab Results  Component Value Date   WBC 11.6 (H) 06/28/2019   HGB 16.0 06/28/2019   HCT 52.4 (H) 06/28/2019   MCV 78.7 (L) 06/28/2019   PLT 121 (L) 06/28/2019   NEUTROABS 8.5 (H) 06/28/2019    Imaging:  CT Chest W Contrast  Result Date: 06/28/2019 CLINICAL DATA:  Restage multifocal HCC EXAM: CT CHEST, ABDOMEN, AND PELVIS WITH CONTRAST TECHNIQUE: Multidetector CT imaging of the chest, abdomen and pelvis was performed following the standard protocol during bolus administration of intravenous contrast. Multiphasic contrast enhanced imaging was performed of the abdomen CONTRAST:  152mL OMNIPAQUE IOHEXOL 300 MG/ML SOLN, additional oral enteric contrast COMPARISON:  05/02/2019 FINDINGS: CT CHEST FINDINGS Cardiovascular: Aortic atherosclerosis. Normal heart size. No pericardial effusion. Although this examination is not tailored for the evaluation of pulmonary embolism, there is some evidence of persistent embolus, particularly in the segmental arteries of the right upper lobe (series 7, image 40) and left lower lobe (series 7, image 59). Mediastinum/Nodes: Calcified right hilar and  subcarinal lymph nodes. Thyroid gland, trachea, and esophagus demonstrate no significant findings. Lungs/Pleura: There are innumerable new and enlarged pulmonary nodules, an index nodule in the anterior right upper lobe measuring 1.1 cm, previously 0.7 cm (series 11, image 60). No pleural effusion or pneumothorax. Musculoskeletal: No chest wall mass or suspicious bone lesions identified. CT ABDOMEN PELVIS FINDINGS Hepatobiliary: Hepatomegaly and coarse contour of the liver. Slight interval enlargement of very bulky liver masses, dominant mass in the central right lobe of the liver measuring at least 16.5 x 13.4 cm, previously 15.8 x 12.6 cm when measured similarly (series 7, image 109). There are multiple new and enlarged much smaller lesions throughout the liver, for example in the liver dome a lesion measuring 1.1 cm, previously no greater than 0.7 cm (series 7, image 78). No gallstones, gallbladder wall thickening, or biliary dilatation. Pancreas: Unremarkable. No pancreatic ductal dilatation or surrounding inflammatory changes. Spleen: Mild splenomegaly, maximum coronal span 14.2 cm. Numerous splenic parenchymal calcifications in keeping with prior granulomatous infection. Adrenals/Urinary Tract: Adrenal glands are unremarkable. Kidneys are normal, without renal calculi, solid lesion, or hydronephrosis. Bladder is unremarkable. Stomach/Bowel: Stomach is within normal limits. Appendix is not clearly visualized. No evidence of bowel wall thickening, distention, or inflammatory changes. Sigmoid diverticulosis. Vascular/Lymphatic: Aortic atherosclerosis. No enlarged abdominal or pelvic lymph nodes. Reproductive: No mass or other abnormality. Other: No abdominal wall hernia or abnormality. Trace ascites in the abdomen. Musculoskeletal: No acute or significant osseous findings. IMPRESSION: 1. Slight interval enlargement of very bulky liver masses. There are multiple new and enlarged much smaller lesions throughout the  liver. Findings are consistent with worsened multifocal hepatocellular carcinoma.  2. There are innumerable new and enlarged pulmonary nodules. Findings are consistent with worsened pulmonary metastatic disease. 3. Stigmata of cirrhosis and portal hypertension. 4. Trace ascites in the abdomen. 5. Although this examination is not tailored for the evaluation of pulmonary embolism, there is some evidence of persistent embolus noted on CT angiogram dated 05/02/2019. No obvious new embolus appreciated. 6. Aortic Atherosclerosis (ICD10-I70.0). Electronically Signed   By: Eddie Candle M.D.   On: 06/28/2019 16:21   CT Abdomen Pelvis W Contrast  Result Date: 06/28/2019 CLINICAL DATA:  Restage multifocal HCC EXAM: CT CHEST, ABDOMEN, AND PELVIS WITH CONTRAST TECHNIQUE: Multidetector CT imaging of the chest, abdomen and pelvis was performed following the standard protocol during bolus administration of intravenous contrast. Multiphasic contrast enhanced imaging was performed of the abdomen CONTRAST:  119mL OMNIPAQUE IOHEXOL 300 MG/ML SOLN, additional oral enteric contrast COMPARISON:  05/02/2019 FINDINGS: CT CHEST FINDINGS Cardiovascular: Aortic atherosclerosis. Normal heart size. No pericardial effusion. Although this examination is not tailored for the evaluation of pulmonary embolism, there is some evidence of persistent embolus, particularly in the segmental arteries of the right upper lobe (series 7, image 40) and left lower lobe (series 7, image 59). Mediastinum/Nodes: Calcified right hilar and subcarinal lymph nodes. Thyroid gland, trachea, and esophagus demonstrate no significant findings. Lungs/Pleura: There are innumerable new and enlarged pulmonary nodules, an index nodule in the anterior right upper lobe measuring 1.1 cm, previously 0.7 cm (series 11, image 60). No pleural effusion or pneumothorax. Musculoskeletal: No chest wall mass or suspicious bone lesions identified. CT ABDOMEN PELVIS FINDINGS Hepatobiliary:  Hepatomegaly and coarse contour of the liver. Slight interval enlargement of very bulky liver masses, dominant mass in the central right lobe of the liver measuring at least 16.5 x 13.4 cm, previously 15.8 x 12.6 cm when measured similarly (series 7, image 109). There are multiple new and enlarged much smaller lesions throughout the liver, for example in the liver dome a lesion measuring 1.1 cm, previously no greater than 0.7 cm (series 7, image 78). No gallstones, gallbladder wall thickening, or biliary dilatation. Pancreas: Unremarkable. No pancreatic ductal dilatation or surrounding inflammatory changes. Spleen: Mild splenomegaly, maximum coronal span 14.2 cm. Numerous splenic parenchymal calcifications in keeping with prior granulomatous infection. Adrenals/Urinary Tract: Adrenal glands are unremarkable. Kidneys are normal, without renal calculi, solid lesion, or hydronephrosis. Bladder is unremarkable. Stomach/Bowel: Stomach is within normal limits. Appendix is not clearly visualized. No evidence of bowel wall thickening, distention, or inflammatory changes. Sigmoid diverticulosis. Vascular/Lymphatic: Aortic atherosclerosis. No enlarged abdominal or pelvic lymph nodes. Reproductive: No mass or other abnormality. Other: No abdominal wall hernia or abnormality. Trace ascites in the abdomen. Musculoskeletal: No acute or significant osseous findings. IMPRESSION: 1. Slight interval enlargement of very bulky liver masses. There are multiple new and enlarged much smaller lesions throughout the liver. Findings are consistent with worsened multifocal hepatocellular carcinoma. 2. There are innumerable new and enlarged pulmonary nodules. Findings are consistent with worsened pulmonary metastatic disease. 3. Stigmata of cirrhosis and portal hypertension. 4. Trace ascites in the abdomen. 5. Although this examination is not tailored for the evaluation of pulmonary embolism, there is some evidence of persistent embolus noted  on CT angiogram dated 05/02/2019. No obvious new embolus appreciated. 6. Aortic Atherosclerosis (ICD10-I70.0). Electronically Signed   By: Eddie Candle M.D.   On: 06/28/2019 16:21    Medications: I have reviewed the patient's current medications.  Assessment/Plan: 1. Hepatocellular carcinoma  Ultrasound abdomen 04/11/1999-2 solid hepatic masses, largest measuring 15 cm  MRI liver  04/12/2019-cirrhosis, dominant central hepatic mass, dominant segment 6 mass, several small enhancing lesions in the bilateral liver, imaging features consistent with LR-5 criteria  Cycle 1 atezolizumab/Avastin 04/28/2019  CT chest 05/02/2019-segmental pulmonary emboli within the right upper and left lower lobes. Innumerable bilateral pulmonary nodules.  CT abdomen/pelvis 05/02/2019-stable multifocal hepatocellular carcinoma, stable portacaval adenopathy  Cycle 2 atezolizumab/Avastin 05/19/2019  Cycle 3 atezolizumab/Avastin 06/09/2019  CTs 06/28/2019-interval enlargement of bulky liver masses.  Multiple new and enlarged much smaller lesions throughout the liver.  Innumerable new and enlarged pulmonary nodules. 2. History of hepatitis C, treatedin 2019 3. Depression 4. ADHD 5. Lower extremity neuropathy 6. Hypertension 7. Status post partial left foot amputation 8. Chest CT 05/02/2019-segmental pulmonary emboli within the right upper and left lower lobes, minimal clot burden no evidence of right heart strain. Started on Xarelto  Disposition: Mr. Fredericks appears unchanged.  He has completed 3 cycles of atezolizumab/Avastin.  Restaging CTs from yesterday show progression in the liver and lungs.  Current treatment will be discontinued.  Dr. Benay Spice reviewed options to include comfort/supportive care, trial of a TKI, chemotherapy, referral for a second opinion at [facility].  He agrees to the second opinion at [facility].  He will return for a follow-up visit here on 07/08/2019.  He will contact the office in the interim with any  problems.  Patient seen with Dr. Benay Spice.  CT images reviewed on the computer with Mr. Macpherson and his wife.    Ned Card ANP/GNP-BC   06/29/2019  10:21 AM This was a shared visit with Ned Card.  Mr. Wymore was interviewed and examined.  We reviewed the CT images with Mr. Virts and his wife.  The restaging CTs reveal evidence of disease progression in the liver and lungs.  The bevacizumab/atezolizumab will be discontinued.  We discussed treatment options including comfort care, a tyrosine kinase inhibitor, chemotherapy, and referral for a second opinion at [facility].  We will make a referral to [facility].

## 2019-06-29 NOTE — Telephone Encounter (Signed)
Scheduled per 6/9 los. Printed AVS and calendar for pt.

## 2019-07-05 ENCOUNTER — Telehealth: Payer: Self-pay | Admitting: *Deleted

## 2019-07-05 NOTE — Telephone Encounter (Signed)
Wife called to report that [facility] has not received records yet for his referral ordered last week.  Faxed referral order, demographics, insurance card, last office note, chemo record and last CT report to [phone] att: Dr. Altamease Oiler or Dr. Aleatha Borer. Contacted radiology to push over last CT films in [facility] to [facility]. Called wife back and she reports they have an appointment with Dr. Altamease Oiler on 07/07/19.

## 2019-07-07 DIAGNOSIS — I2699 Other pulmonary embolism without acute cor pulmonale: Secondary | ICD-10-CM | POA: Diagnosis not present

## 2019-07-07 DIAGNOSIS — R0902 Hypoxemia: Secondary | ICD-10-CM | POA: Diagnosis not present

## 2019-07-07 DIAGNOSIS — C228 Malignant neoplasm of liver, primary, unspecified as to type: Secondary | ICD-10-CM | POA: Diagnosis not present

## 2019-07-07 DIAGNOSIS — C7800 Secondary malignant neoplasm of unspecified lung: Secondary | ICD-10-CM | POA: Diagnosis not present

## 2019-07-08 ENCOUNTER — Other Ambulatory Visit: Payer: Self-pay

## 2019-07-08 ENCOUNTER — Telehealth: Payer: Self-pay | Admitting: Pharmacist

## 2019-07-08 ENCOUNTER — Telehealth: Payer: Self-pay

## 2019-07-08 ENCOUNTER — Inpatient Hospital Stay: Payer: Medicare HMO | Admitting: Oncology

## 2019-07-08 ENCOUNTER — Telehealth: Payer: Self-pay | Admitting: *Deleted

## 2019-07-08 ENCOUNTER — Telehealth: Payer: Self-pay | Admitting: Oncology

## 2019-07-08 ENCOUNTER — Other Ambulatory Visit: Payer: Self-pay | Admitting: Pharmacist

## 2019-07-08 VITALS — BP 124/68 | HR 78 | Temp 98.1°F | Resp 17 | Wt 191.0 lb

## 2019-07-08 DIAGNOSIS — I10 Essential (primary) hypertension: Secondary | ICD-10-CM | POA: Diagnosis not present

## 2019-07-08 DIAGNOSIS — C220 Liver cell carcinoma: Secondary | ICD-10-CM

## 2019-07-08 DIAGNOSIS — Z86711 Personal history of pulmonary embolism: Secondary | ICD-10-CM | POA: Diagnosis not present

## 2019-07-08 DIAGNOSIS — Z9221 Personal history of antineoplastic chemotherapy: Secondary | ICD-10-CM | POA: Diagnosis not present

## 2019-07-08 DIAGNOSIS — F909 Attention-deficit hyperactivity disorder, unspecified type: Secondary | ICD-10-CM | POA: Diagnosis not present

## 2019-07-08 DIAGNOSIS — Z7901 Long term (current) use of anticoagulants: Secondary | ICD-10-CM | POA: Diagnosis not present

## 2019-07-08 DIAGNOSIS — F329 Major depressive disorder, single episode, unspecified: Secondary | ICD-10-CM | POA: Diagnosis not present

## 2019-07-08 MED ORDER — LENVATINIB (12 MG DAILY DOSE) 3 X 4 MG PO CPPK: 12.0000 mg | ORAL_CAPSULE | Freq: Every day | ORAL | 0 refills | Status: DC

## 2019-07-08 MED ORDER — GABAPENTIN 300 MG PO CAPS: 300.0000 mg | ORAL_CAPSULE | Freq: Three times a day (TID) | ORAL | 0 refills | Status: DC | PRN

## 2019-07-08 MED ORDER — LENVIMA (12 MG DAILY DOSE) 3 X 4 MG PO CPPK: 12.0000 mg | ORAL_CAPSULE | Freq: Every day | ORAL | 0 refills | Status: DC

## 2019-07-08 MED ORDER — CITALOPRAM HYDROBROMIDE 10 MG PO TABS: 10.0000 mg | ORAL_TABLET | Freq: Every day | ORAL | 0 refills | Status: DC

## 2019-07-08 NOTE — Telephone Encounter (Signed)
[place] and confirmed his oxygen orders were received. They will reach out to patient today re: delivery. Called wife and gave her update.

## 2019-07-08 NOTE — Telephone Encounter (Signed)
Notified patient (unable to reach wife) that beginning tomorrow he needs to decrease Celexa to 10 mg daily and stop taking it the day prior to starting the lenvatinib. Oncology pharmacist will call next week and go over everything with him.

## 2019-07-08 NOTE — Telephone Encounter (Signed)
Oral Oncology Patient Advocate Encounter  Prior Authorization for William Solis has been approved.    PA# [id number] Effective dates: 07/08/19 through 01/04/20  Patients co-pay is $0301.31  Oral Oncology Clinic will continue to follow.    [name] [place] Phone [phone] Fax [phone] 07/08/2019 3:34 PM

## 2019-07-08 NOTE — Telephone Encounter (Signed)
Scheduled appt per 6/18 los - gave patient AVS

## 2019-07-08 NOTE — Telephone Encounter (Signed)
Oral Oncology Patient Advocate Encounter  Received notification from [facility] that prior authorization for William Solis is required.  PA submitted on CoverMyMeds Key [id number] Status is pending  Oral Oncology Clinic will continue to follow.  [place] Patient [place] Phone [phone] Fax [phone] 07/08/2019 3:15 PM

## 2019-07-08 NOTE — Telephone Encounter (Signed)
Oral Oncology Pharmacist Encounter  Received new prescription for Lenvima (lenvatinib) for the treatment of [place], planned duration until disease progression or unacceptable drug toxicity.  CMP from 06/28/19 assessed, no relevant lab abnormalities. Prescription dose and frequency assessed.   Current medication list in Epic reviewed, one DDIs with lenvatinib identified: - Citalopram: The interaction with lenvatinib and citalopram is category X (avoid combination) due to the increased risk of QTc prolongation. Patient's most recent ECG on 05/02/19 had a relatively high QTc at 499.  Recommend weaning Mr. Vences off of the citalopram before the start of lenvatinib. Mr. Benay Spice notified.   Prescription has been e-scribed to the [facility] for benefits analysis and approval.  Oral Oncology Clinic will continue to follow for insurance authorization, copayment issues, initial counseling and start date.  Darl Pikes, PharmD, BCPS, BCOP, CPP Hematology/Oncology Clinical Pharmacist Practitioner [facility] [employer] [facility] [phone]  07/08/2019 1:00 PM

## 2019-07-08 NOTE — Patient Instructions (Signed)
Please provide copy of your medical advanced directive to have scanned into chart.

## 2019-07-08 NOTE — Progress Notes (Signed)
[place] OFFICE PROGRESS NOTE   Diagnosis: Hepatocellular carcinoma  INTERVAL HISTORY:   William Solis returns for a scheduled visit.  He continues to feel weak.  He has exertional dyspnea.  He has right upper abdominal pain.  The pain is relieved with oxycodone.  He takes oxycodone approximately 3 times daily.  No bleeding. [name] is staying at home most of the time.  He is ambulatory in the home.  He saw Dr. Durene Cal off on 07/07/2019.  She recommends systemic therapy with lenvatinib versus supportive care.  Objective:  Vital signs in last 24 hours:  There were no vitals taken for this visit.    Resp: Lungs clear bilaterally, no respiratory distress Cardio: Regular rate and rhythm GI: Fullness in the right upper abdomen Vascular: No leg edema Neuro: Alert and oriented     Lab Results:  Lab Results  Component Value Date   WBC 11.6 (H) 06/28/2019   HGB 16.0 06/28/2019   HCT 52.4 (H) 06/28/2019   MCV 78.7 (L) 06/28/2019   PLT 121 (L) 06/28/2019   NEUTROABS 8.5 (H) 06/28/2019    CMP  Lab Results  Component Value Date   NA 133 (L) 06/28/2019   K 3.9 06/28/2019   CL 95 (L) 06/28/2019   CO2 23 06/28/2019   GLUCOSE 91 06/28/2019   BUN 12 06/28/2019   CREATININE 0.78 06/28/2019   CALCIUM 9.0 06/28/2019   PROT 7.9 06/28/2019   ALBUMIN 2.4 (L) 06/28/2019   AST 129 (H) 06/28/2019   ALT 80 (H) 06/28/2019   ALKPHOS 284 (H) 06/28/2019   BILITOT 1.6 (H) 06/28/2019   GFRNONAA >60 06/28/2019   GFRAA >60 06/28/2019    Medications: I have reviewed the patient's current medications.   Assessment/Plan: 1. Hepatocellular carcinoma  Ultrasound abdomen 04/11/1999-2 solid hepatic masses, largest measuring 15 cm  MRI liver 04/12/2019-cirrhosis, dominant central hepatic mass, dominant segment 6 mass, several small enhancing lesions in the bilateral liver, imaging features consistent with LR-5 criteria  Cycle 1 atezolizumab/Avastin 04/28/2019  CT chest  05/02/2019-segmental pulmonary emboli within the right upper and left lower lobes. Innumerable bilateral pulmonary nodules.  CT abdomen/pelvis 05/02/2019-stable multifocal hepatocellular carcinoma, stable portacaval adenopathy  Cycle 2 atezolizumab/Avastin 05/19/2019  Cycle 3 atezolizumab/Avastin 06/09/2019  CTs 06/28/2019-interval enlargement of bulky liver masses.  Multiple new and enlarged much smaller lesions throughout the liver.  Innumerable new and enlarged pulmonary nodules. 2. History of hepatitis C, treatedin 2019 3. Depression 4. ADHD 5. Lower extremity neuropathy 6. Hypertension 7. Status post partial left foot amputation 8. Chest CT 05/02/2019-segmental pulmonary emboli within the right upper and left lower lobes, minimal clot burden no evidence of right heart strain. Started on Xarelto    Disposition: [name] has metastatic hepatocellular carcinoma.  There is clinical and radiologic evidence of disease progression following atezolizumab/Avastin.  [name] saw Dr. Altamease Oiler at [facility] yesterday.  We discussed treatment options with him again today.  He understands no therapy will be curative.  We discussed the goals of systemic therapy are to palliate his symptoms and potentially prolong survival.  He has a borderline performance status to receive systemic therapy.  We discussed comfort care and a trial of lenvatinib.  We reviewed potential toxicities associated with lenvatinib including the chance of bleeding, hypertension, nausea, diarrhea, rash, and nephrotoxicity.  He was given reading materials on lenvatinib.  [name] would like to proceed with a trial of lenvatinib.  He is taking Celexa.  There is an interaction between Celexa  and lenvatinib.  Celexa will be tapered off.  I prescribed lenvatinib today with the plan to begin treatment on 07/13/2019.  He has exertional dyspnea and developed hypoxia with ambulation on room air.  We will arrange for home oxygen  therapy.  [name] will return for an office and lab visit on 07/22/2019.  We will monitor his clinical status closely and recommend hospice care if he continues to decline.  Betsy Coder, MD  07/08/2019  2:57 PM

## 2019-07-08 NOTE — Progress Notes (Addendum)
In office visibly short of breath w/increase in dyspnea w/exertion. 02 sat at rest on room air: 89% O2 sat w/ambulation on room air: 86%  O2 sat at rest w/oxygen at 2 l/min [place] 94 % O2 sat w/ambulation on oxygen at 2 l/min [place] 92%

## 2019-07-08 NOTE — Telephone Encounter (Signed)
Faxed office note and sat readings to [place].

## 2019-07-11 ENCOUNTER — Telehealth: Payer: Self-pay

## 2019-07-11 ENCOUNTER — Telehealth: Payer: Self-pay | Admitting: *Deleted

## 2019-07-11 NOTE — Telephone Encounter (Signed)
Oral Chemotherapy Pharmacist Encounter  Spoke to patient's wife Santiago Glad for education. She plans on picking up his Michel Santee from [place] tomorrow 07/12/19.  Patient Education I spoke with Santiago Glad for overview of new oral chemotherapy medication: Lenvima (lenvatinib) for the treatment of [place] [address], planned duration until disease progression or unacceptable drug toxicity.   Counseled Karen on administration, dosing, side effects, monitoring, drug-food interactions, safe handling, storage, and disposal. Patient will take 12 mg by mouth daily.  Side effects include but not limited to: diarrhea, hand-foot syndrome, N/V, fatigue, HTN, edema.    Reviewed citalopram drug-drug interaction with Santiago Glad. She stated that her husband started on the reduced dose citalopram (10mg ) on Saturday 07/09/19. She knows the citalopram should be stopped prior to starting the lenvatinib, ideally with a one day wash out period due to the half-life of citalopram. Discussed the risk the citalopram withdrawal.  Reviewed with Santiago Glad importance of keeping a medication schedule and plan for any missed doses.  Santiago Glad voiced understanding and appreciation. All questions answered. Medication handout placed in the mail.  Provided patient with Oral [facility] phone number. Patient knows to call the office with questions or concerns. Oral Chemotherapy [facility] will continue to follow.  Darl Pikes, PharmD, BCPS, BCOP, CPP Hematology/Oncology Clinical Pharmacist Practitioner [facility] [phone]  07/11/2019 3:05 PM

## 2019-07-11 NOTE — Telephone Encounter (Signed)
Called to report they are unable to process the oxygen order without notation in order if this is continuous or intermittent need and for length of time needed. Also need to document if O2 sat 86% w/ambulation was on room air or oxygen. Also stated MD needs to document the reason for hospital bed. Corrected the order for continuous for lifetime and corrected the sat documentation that was done in office and faxed back to [place] and noted on cover sheet that Dr. Benay Spice does not recall discussion of hospital bed. Called wife and let her know why the oxygen has not been delivered yet and that corrections were sent.

## 2019-07-11 NOTE — Telephone Encounter (Signed)
Oral Oncology Patient Advocate Encounter   Was successful in securing patient a $10000 grant from [employer] [place] (PAF) to provide copayment coverage for Lenvima.  This will keep the out of pocket expense at $0.     I have spoken with the patient.    The billing information is as follows and has been shared with [place]: [id number] PCN:  [id number] Member ID: [id number] Group ID: [id number] Dates of Eligibility: 07/08/19 through 07/07/20  [place] Patient [place] Phone [phone] Fax [phone] 07/11/2019 10:19 AM

## 2019-07-12 ENCOUNTER — Telehealth: Payer: Self-pay | Admitting: *Deleted

## 2019-07-12 MED FILL — LENVIMA 12 MG DAILY DOSE 4: 3 X 4 | 30 days supply | Qty: 90 | Fill #0

## 2019-07-12 NOTE — Telephone Encounter (Addendum)
Call to f/u on corrected oxygen orders and office note that was re-faxed on 6/21. Was informed they did not receive it. Refaxed to [phone] with note to call nurse when received. [employer] called and confirmed receipt of orders.

## 2019-07-14 ENCOUNTER — Telehealth: Payer: Self-pay | Admitting: *Deleted

## 2019-07-14 DIAGNOSIS — C220 Liver cell carcinoma: Secondary | ICD-10-CM | POA: Diagnosis not present

## 2019-07-14 DIAGNOSIS — C7800 Secondary malignant neoplasm of unspecified lung: Secondary | ICD-10-CM | POA: Diagnosis not present

## 2019-07-14 NOTE — Telephone Encounter (Signed)
Wife reports no oxygen delivery. She spoke with [employer] last night and was told they are "in the que" for delivery. Called [employer] and spoke with Abigail Butts, who confirmed this. Stressed to her that he needs this oxygen "yesterday" and is suffering waiting for delivery. Provided wife's phone # to call. She will send email to logistics to f/u and have them call back to confirm delivery.

## 2019-07-14 NOTE — Telephone Encounter (Signed)
Called wife back and she confirms they will be delivering is oxygen at 3:30 pm today.

## 2019-07-17 ENCOUNTER — Other Ambulatory Visit: Payer: Self-pay | Admitting: Oncology

## 2019-07-18 ENCOUNTER — Observation Stay (HOSPITAL_COMMUNITY): Payer: Medicare HMO

## 2019-07-18 ENCOUNTER — Inpatient Hospital Stay (HOSPITAL_COMMUNITY)
Admission: EM | Admit: 2019-07-18 | Discharge: 2019-07-26 | DRG: 083 | Disposition: A | Discharge status: Skilled Nursing Facility | Payer: Medicare HMO | Attending: Internal Medicine | Admitting: Internal Medicine

## 2019-07-18 ENCOUNTER — Emergency Department (HOSPITAL_COMMUNITY): Payer: Medicare HMO

## 2019-07-18 ENCOUNTER — Encounter (HOSPITAL_COMMUNITY): Payer: Self-pay | Admitting: Emergency Medicine

## 2019-07-18 ENCOUNTER — Other Ambulatory Visit: Payer: Self-pay

## 2019-07-18 DIAGNOSIS — R278 Other lack of coordination: Secondary | ICD-10-CM | POA: Diagnosis present

## 2019-07-18 DIAGNOSIS — M1990 Unspecified osteoarthritis, unspecified site: Secondary | ICD-10-CM | POA: Diagnosis present

## 2019-07-18 DIAGNOSIS — F319 Bipolar disorder, unspecified: Secondary | ICD-10-CM | POA: Diagnosis present

## 2019-07-18 DIAGNOSIS — H04129 Dry eye syndrome of unspecified lacrimal gland: Secondary | ICD-10-CM | POA: Diagnosis present

## 2019-07-18 DIAGNOSIS — Z7401 Bed confinement status: Secondary | ICD-10-CM | POA: Diagnosis not present

## 2019-07-18 DIAGNOSIS — Z8619 Personal history of other infectious and parasitic diseases: Secondary | ICD-10-CM | POA: Diagnosis not present

## 2019-07-18 DIAGNOSIS — Z515 Encounter for palliative care: Secondary | ICD-10-CM | POA: Diagnosis present

## 2019-07-18 DIAGNOSIS — Z86711 Personal history of pulmonary embolism: Secondary | ICD-10-CM

## 2019-07-18 DIAGNOSIS — S065X0A Traumatic subdural hemorrhage without loss of consciousness, initial encounter: Secondary | ICD-10-CM | POA: Diagnosis not present

## 2019-07-18 DIAGNOSIS — Z20822 Contact with and (suspected) exposure to covid-19: Secondary | ICD-10-CM | POA: Diagnosis present

## 2019-07-18 DIAGNOSIS — M6281 Muscle weakness (generalized): Secondary | ICD-10-CM | POA: Diagnosis not present

## 2019-07-18 DIAGNOSIS — M2550 Pain in unspecified joint: Secondary | ICD-10-CM | POA: Diagnosis not present

## 2019-07-18 DIAGNOSIS — G5790 Unspecified mononeuropathy of unspecified lower limb: Secondary | ICD-10-CM | POA: Diagnosis present

## 2019-07-18 DIAGNOSIS — M62838 Other muscle spasm: Secondary | ICD-10-CM | POA: Diagnosis present

## 2019-07-18 DIAGNOSIS — E440 Moderate protein-calorie malnutrition: Secondary | ICD-10-CM | POA: Diagnosis present

## 2019-07-18 DIAGNOSIS — Y92012 Bathroom of single-family (private) house as the place of occurrence of the external cause: Secondary | ICD-10-CM

## 2019-07-18 DIAGNOSIS — B1920 Unspecified viral hepatitis C without hepatic coma: Secondary | ICD-10-CM | POA: Diagnosis present

## 2019-07-18 DIAGNOSIS — R911 Solitary pulmonary nodule: Secondary | ICD-10-CM | POA: Diagnosis not present

## 2019-07-18 DIAGNOSIS — E871 Hypo-osmolality and hyponatremia: Secondary | ICD-10-CM | POA: Diagnosis present

## 2019-07-18 DIAGNOSIS — C7800 Secondary malignant neoplasm of unspecified lung: Secondary | ICD-10-CM | POA: Diagnosis present

## 2019-07-18 DIAGNOSIS — R531 Weakness: Secondary | ICD-10-CM

## 2019-07-18 DIAGNOSIS — G253 Myoclonus: Secondary | ICD-10-CM | POA: Diagnosis present

## 2019-07-18 DIAGNOSIS — R188 Other ascites: Secondary | ICD-10-CM | POA: Diagnosis not present

## 2019-07-18 DIAGNOSIS — Z9181 History of falling: Secondary | ICD-10-CM | POA: Diagnosis not present

## 2019-07-18 DIAGNOSIS — C787 Secondary malignant neoplasm of liver and intrahepatic bile duct: Secondary | ICD-10-CM | POA: Diagnosis not present

## 2019-07-18 DIAGNOSIS — Z9981 Dependence on supplemental oxygen: Secondary | ICD-10-CM

## 2019-07-18 DIAGNOSIS — I10 Essential (primary) hypertension: Secondary | ICD-10-CM | POA: Diagnosis present

## 2019-07-18 DIAGNOSIS — G4733 Obstructive sleep apnea (adult) (pediatric): Secondary | ICD-10-CM | POA: Diagnosis present

## 2019-07-18 DIAGNOSIS — G8929 Other chronic pain: Secondary | ICD-10-CM | POA: Diagnosis present

## 2019-07-18 DIAGNOSIS — M48061 Spinal stenosis, lumbar region without neurogenic claudication: Secondary | ICD-10-CM | POA: Diagnosis present

## 2019-07-18 DIAGNOSIS — F3341 Major depressive disorder, recurrent, in partial remission: Secondary | ICD-10-CM | POA: Diagnosis not present

## 2019-07-18 DIAGNOSIS — Z87891 Personal history of nicotine dependence: Secondary | ICD-10-CM

## 2019-07-18 DIAGNOSIS — Z79899 Other long term (current) drug therapy: Secondary | ICD-10-CM

## 2019-07-18 DIAGNOSIS — W19XXXA Unspecified fall, initial encounter: Secondary | ICD-10-CM | POA: Diagnosis present

## 2019-07-18 DIAGNOSIS — G4700 Insomnia, unspecified: Secondary | ICD-10-CM | POA: Diagnosis present

## 2019-07-18 DIAGNOSIS — S065X9A Traumatic subdural hemorrhage with loss of consciousness of unspecified duration, initial encounter: Principal | ICD-10-CM | POA: Diagnosis present

## 2019-07-18 DIAGNOSIS — R5381 Other malaise: Secondary | ICD-10-CM | POA: Diagnosis not present

## 2019-07-18 DIAGNOSIS — Z8701 Personal history of pneumonia (recurrent): Secondary | ICD-10-CM

## 2019-07-18 DIAGNOSIS — R627 Adult failure to thrive: Secondary | ICD-10-CM | POA: Diagnosis present

## 2019-07-18 DIAGNOSIS — J189 Pneumonia, unspecified organism: Secondary | ICD-10-CM | POA: Diagnosis not present

## 2019-07-18 DIAGNOSIS — I2699 Other pulmonary embolism without acute cor pulmonale: Secondary | ICD-10-CM | POA: Diagnosis not present

## 2019-07-18 DIAGNOSIS — C3490 Malignant neoplasm of unspecified part of unspecified bronchus or lung: Secondary | ICD-10-CM | POA: Diagnosis not present

## 2019-07-18 DIAGNOSIS — I6201 Nontraumatic acute subdural hemorrhage: Secondary | ICD-10-CM | POA: Diagnosis not present

## 2019-07-18 DIAGNOSIS — K7460 Unspecified cirrhosis of liver: Secondary | ICD-10-CM | POA: Diagnosis present

## 2019-07-18 DIAGNOSIS — J80 Acute respiratory distress syndrome: Secondary | ICD-10-CM | POA: Diagnosis not present

## 2019-07-18 DIAGNOSIS — F909 Attention-deficit hyperactivity disorder, unspecified type: Secondary | ICD-10-CM | POA: Diagnosis present

## 2019-07-18 DIAGNOSIS — R0489 Hemorrhage from other sites in respiratory passages: Secondary | ICD-10-CM | POA: Diagnosis not present

## 2019-07-18 DIAGNOSIS — R042 Hemoptysis: Secondary | ICD-10-CM | POA: Diagnosis not present

## 2019-07-18 DIAGNOSIS — R0602 Shortness of breath: Secondary | ICD-10-CM | POA: Diagnosis not present

## 2019-07-18 DIAGNOSIS — S0093XA Contusion of unspecified part of head, initial encounter: Secondary | ICD-10-CM | POA: Diagnosis not present

## 2019-07-18 DIAGNOSIS — Z6825 Body mass index (BMI) 25.0-25.9, adult: Secondary | ICD-10-CM

## 2019-07-18 DIAGNOSIS — C220 Liver cell carcinoma: Secondary | ICD-10-CM | POA: Diagnosis present

## 2019-07-18 DIAGNOSIS — R600 Localized edema: Secondary | ICD-10-CM | POA: Diagnosis not present

## 2019-07-18 DIAGNOSIS — I6200 Nontraumatic subdural hemorrhage, unspecified: Secondary | ICD-10-CM | POA: Diagnosis not present

## 2019-07-18 DIAGNOSIS — S065XAA Traumatic subdural hemorrhage with loss of consciousness status unknown, initial encounter: Secondary | ICD-10-CM | POA: Diagnosis present

## 2019-07-18 DIAGNOSIS — Z9221 Personal history of antineoplastic chemotherapy: Secondary | ICD-10-CM | POA: Diagnosis not present

## 2019-07-18 DIAGNOSIS — S065X0D Traumatic subdural hemorrhage without loss of consciousness, subsequent encounter: Secondary | ICD-10-CM | POA: Diagnosis not present

## 2019-07-18 DIAGNOSIS — J9621 Acute and chronic respiratory failure with hypoxia: Secondary | ICD-10-CM | POA: Diagnosis not present

## 2019-07-18 DIAGNOSIS — R7989 Other specified abnormal findings of blood chemistry: Secondary | ICD-10-CM | POA: Diagnosis not present

## 2019-07-18 DIAGNOSIS — R52 Pain, unspecified: Secondary | ICD-10-CM | POA: Diagnosis not present

## 2019-07-18 DIAGNOSIS — F329 Major depressive disorder, single episode, unspecified: Secondary | ICD-10-CM | POA: Diagnosis present

## 2019-07-18 DIAGNOSIS — Z66 Do not resuscitate: Secondary | ICD-10-CM | POA: Diagnosis present

## 2019-07-18 DIAGNOSIS — F419 Anxiety disorder, unspecified: Secondary | ICD-10-CM | POA: Diagnosis present

## 2019-07-18 DIAGNOSIS — R0902 Hypoxemia: Secondary | ICD-10-CM | POA: Diagnosis not present

## 2019-07-18 DIAGNOSIS — Z7901 Long term (current) use of anticoagulants: Secondary | ICD-10-CM

## 2019-07-18 DIAGNOSIS — R42 Dizziness and giddiness: Secondary | ICD-10-CM | POA: Diagnosis not present

## 2019-07-18 DIAGNOSIS — C7802 Secondary malignant neoplasm of left lung: Secondary | ICD-10-CM | POA: Diagnosis not present

## 2019-07-18 DIAGNOSIS — R58 Hemorrhage, not elsewhere classified: Secondary | ICD-10-CM | POA: Diagnosis not present

## 2019-07-18 DIAGNOSIS — Z8249 Family history of ischemic heart disease and other diseases of the circulatory system: Secondary | ICD-10-CM

## 2019-07-18 DIAGNOSIS — J9600 Acute respiratory failure, unspecified whether with hypoxia or hypercapnia: Secondary | ICD-10-CM | POA: Diagnosis not present

## 2019-07-18 LAB — BASIC METABOLIC PANEL
Anion gap: 15 (ref 5–15)
BUN: 20 mg/dL (ref 8–23)
CO2: 20 mmol/L — ABNORMAL LOW (ref 22–32)
Calcium: 8.4 mg/dL — ABNORMAL LOW (ref 8.9–10.3)
Chloride: 95 mmol/L — ABNORMAL LOW (ref 98–111)
Creatinine, Ser: 0.7 mg/dL (ref 0.61–1.24)
GFR calc Af Amer: >60 mL/min (ref >60)
GFR calc non Af Amer: >60 mL/min (ref >60)
Glucose, Bld: 90 mg/dL (ref 70–99)
Potassium: 5 mmol/L (ref 3.5–5.1)
Sodium: 130 mmol/L — ABNORMAL LOW (ref 135–145)

## 2019-07-18 LAB — CBC
HCT: 50.8 % (ref 39.0–52.0)
Hemoglobin: 15.8 g/dL (ref 13.0–17.0)
MCH: 24.2 pg — ABNORMAL LOW (ref 26.0–34.0)
MCHC: 31.1 g/dL (ref 30.0–36.0)
MCV: 77.7 fL — ABNORMAL LOW (ref 80.0–100.0)
Platelets: 96 10*3/uL — ABNORMAL LOW (ref 150–400)
RBC: 6.54 MIL/uL — ABNORMAL HIGH (ref 4.22–5.81)
RDW: 21.5 % — ABNORMAL HIGH (ref 11.5–15.5)
WBC: 14.6 10*3/uL — ABNORMAL HIGH (ref 4.0–10.5)
nRBC: 0.3 % — ABNORMAL HIGH (ref 0.0–0.2)

## 2019-07-18 LAB — URINALYSIS, ROUTINE W REFLEX MICROSCOPIC
Bacteria, UA: NONE SEEN
Bilirubin Urine: NEGATIVE
Glucose, UA: NEGATIVE mg/dL
Ketones, ur: NEGATIVE mg/dL
Leukocytes,Ua: NEGATIVE
Nitrite: NEGATIVE
Protein, ur: NEGATIVE mg/dL
Specific Gravity, Urine: 1.013 (ref 1.005–1.030)
pH: 5 (ref 5.0–8.0)

## 2019-07-18 LAB — SARS CORONAVIRUS 2 BY RT PCR (HOSPITAL ORDER, PERFORMED IN ~~LOC~~ HOSPITAL LAB): SARS Coronavirus 2: NEGATIVE

## 2019-07-18 LAB — CBG MONITORING, ED: Glucose-Capillary: 100 mg/dL — ABNORMAL HIGH (ref 70–99)

## 2019-07-18 MED ORDER — SALINE SPRAY 0.65 % NA SOLN: 1.0000 | NASAL | Status: DC | PRN

## 2019-07-18 MED ORDER — SODIUM CHLORIDE 0.9 % IV SOLN: 250.0000 mL | INTRAVENOUS | Status: DC | PRN

## 2019-07-18 MED ORDER — PROTHROMBIN COMPLEX CONC HUMAN 500 UNITS IV KIT
4116.0000 [IU] | PACK | Status: AC
  Administered 2019-07-18: 4116 [IU] via INTRAVENOUS
  Filled 2019-07-18: qty 4116

## 2019-07-18 MED ORDER — SODIUM CHLORIDE 0.9% FLUSH
3.0000 mL | Freq: Two times a day (BID) | INTRAVENOUS | Status: DC
  Administered 2019-07-18 – 2019-07-25 (×12): 3 mL via INTRAVENOUS

## 2019-07-18 MED ORDER — PROCHLORPERAZINE MALEATE 10 MG PO TABS: 10.0000 mg | ORAL_TABLET | Freq: Four times a day (QID) | ORAL | Status: DC | PRN

## 2019-07-18 MED ORDER — SODIUM CHLORIDE 0.9% FLUSH: 3.0000 mL | INTRAVENOUS | Status: DC | PRN

## 2019-07-18 MED ORDER — AMLODIPINE BESYLATE 5 MG PO TABS
10.0000 mg | ORAL_TABLET | Freq: Every day | ORAL | Status: DC
  Administered 2019-07-18 – 2019-07-26 (×9): 10 mg via ORAL
  Filled 2019-07-18 (×9): qty 2

## 2019-07-18 MED ORDER — ORAL CARE MOUTH RINSE
15.0000 mL | Freq: Two times a day (BID) | OROMUCOSAL | Status: DC
  Administered 2019-07-18 – 2019-07-25 (×11): 15 mL via OROMUCOSAL

## 2019-07-18 MED ORDER — CYCLOBENZAPRINE HCL 10 MG PO TABS
10.0000 mg | ORAL_TABLET | Freq: Three times a day (TID) | ORAL | Status: DC | PRN
  Administered 2019-07-18 – 2019-07-24 (×2): 10 mg via ORAL
  Filled 2019-07-18 (×5): qty 1

## 2019-07-18 MED ORDER — OXYCODONE HCL 5 MG PO TABS
10.0000 mg | ORAL_TABLET | ORAL | Status: DC | PRN
  Administered 2019-07-18 – 2019-07-26 (×21): 10 mg via ORAL
  Filled 2019-07-18 (×22): qty 2

## 2019-07-18 MED ORDER — SODIUM CHLORIDE 0.9 % IV SOLN: Freq: Once | INTRAVENOUS | Status: AC

## 2019-07-18 MED ORDER — TETRAHYDROZOLINE HCL 0.05 % OP SOLN
1.0000 [drp] | Freq: Three times a day (TID) | OPHTHALMIC | Status: DC
  Administered 2019-07-20 – 2019-07-21 (×2): 1 [drp] via OPHTHALMIC
  Filled 2019-07-18: qty 15

## 2019-07-18 MED ORDER — LEVALBUTEROL TARTRATE 45 MCG/ACT IN AERO: 1.0000 | INHALATION_SPRAY | Freq: Four times a day (QID) | RESPIRATORY_TRACT | Status: DC | PRN

## 2019-07-18 MED ORDER — GUAIFENESIN-DM 100-10 MG/5ML PO SYRP
5.0000 mL | ORAL_SOLUTION | ORAL | Status: DC | PRN
  Administered 2019-07-18 – 2019-07-24 (×9): 5 mL via ORAL
  Filled 2019-07-18 (×10): qty 10

## 2019-07-18 MED ORDER — SODIUM CHLORIDE 0.9% FLUSH
3.0000 mL | Freq: Once | INTRAVENOUS | Status: AC
  Administered 2019-07-18: 3 mL via INTRAVENOUS

## 2019-07-18 MED ORDER — LENVATINIB (12 MG DAILY DOSE) 3 X 4 MG PO CPPK: 12.0000 mg | ORAL_CAPSULE | Freq: Every day | ORAL | Status: DC

## 2019-07-18 MED ORDER — LISINOPRIL 20 MG PO TABS
40.0000 mg | ORAL_TABLET | Freq: Every day | ORAL | Status: DC
  Administered 2019-07-18: 40 mg via ORAL
  Filled 2019-07-18: qty 2

## 2019-07-18 MED ORDER — HYDROCHLOROTHIAZIDE 25 MG PO TABS
25.0000 mg | ORAL_TABLET | Freq: Every day | ORAL | Status: DC
  Administered 2019-07-18: 25 mg via ORAL
  Filled 2019-07-18: qty 1

## 2019-07-18 MED ORDER — LEVALBUTEROL HCL 0.63 MG/3ML IN NEBU: 0.6300 mg | INHALATION_SOLUTION | Freq: Four times a day (QID) | RESPIRATORY_TRACT | Status: DC | PRN

## 2019-07-18 MED ORDER — GABAPENTIN 300 MG PO CAPS
300.0000 mg | ORAL_CAPSULE | Freq: Three times a day (TID) | ORAL | Status: DC
  Administered 2019-07-18 – 2019-07-20 (×7): 300 mg via ORAL
  Filled 2019-07-18 (×7): qty 1

## 2019-07-18 MED ORDER — ENSURE ENLIVE PO LIQD
237.0000 mL | Freq: Two times a day (BID) | ORAL | Status: DC
  Administered 2019-07-19 – 2019-07-25 (×5): 237 mL via ORAL

## 2019-07-18 MED ORDER — SODIUM CHLORIDE 0.9 % IV SOLN: INTRAVENOUS | Status: DC

## 2019-07-18 MED ORDER — CARVEDILOL 12.5 MG PO TABS
12.5000 mg | ORAL_TABLET | Freq: Two times a day (BID) | ORAL | Status: DC
  Administered 2019-07-18 – 2019-07-26 (×17): 12.5 mg via ORAL
  Filled 2019-07-18 (×17): qty 1

## 2019-07-18 MED ORDER — FENTANYL CITRATE (PF) 100 MCG/2ML IJ SOLN
50.0000 ug | Freq: Once | INTRAMUSCULAR | Status: AC
  Administered 2019-07-18: 50 ug via INTRAVENOUS
  Filled 2019-07-18: qty 2

## 2019-07-18 MED ORDER — BENZONATATE 100 MG PO CAPS
100.0000 mg | ORAL_CAPSULE | Freq: Two times a day (BID) | ORAL | Status: DC | PRN
  Administered 2019-07-18 – 2019-07-23 (×3): 100 mg via ORAL
  Filled 2019-07-18 (×3): qty 1

## 2019-07-18 MED ORDER — MELATONIN 5 MG PO TABS
10.0000 mg | ORAL_TABLET | Freq: Every day | ORAL | Status: DC
  Administered 2019-07-18 – 2019-07-24 (×4): 10 mg via ORAL
  Filled 2019-07-18 (×5): qty 2

## 2019-07-18 MED ORDER — ACETAMINOPHEN 500 MG PO TABS
500.0000 mg | ORAL_TABLET | Freq: Four times a day (QID) | ORAL | Status: DC | PRN
  Administered 2019-07-18 – 2019-07-26 (×9): 500 mg via ORAL
  Filled 2019-07-18 (×9): qty 1

## 2019-07-18 MED ORDER — HYDROMORPHONE HCL 1 MG/ML IJ SOLN
0.5000 mg | Freq: Once | INTRAMUSCULAR | Status: AC
  Administered 2019-07-18: 0.5 mg via INTRAVENOUS
  Filled 2019-07-18: qty 1

## 2019-07-18 NOTE — ED Triage Notes (Signed)
Patient presents s/p fall with head injury. Patient is a cancer patient on daily oral chemo. Patient states his legs gave out while he was in the bathroom and he fell and hit his head on the door jam. Patient is on 2L [place] PRN at home, but has had an increased demand for it. Patient is on xarelto.

## 2019-07-18 NOTE — H&P (Signed)
History and Physical    William Solis VOH:[id number] DOB: 08/28/1950 DOA: 07/18/2019  PCP: Leamon Arnt, MD (Confirm with patient/family/NH records and if not entered, this has to be entered at [facility] point of entry) Patient coming from: home  I have personally briefly reviewed patient's old medical records in [place]  Chief Complaint: fall striking head  HPI: William Solis is a 69 y.o. male with medical history significant of hepatocarcinoma progressive with pulmonary mets, cirrhosis, asterixis, HTN, Hep C, bipolar disorder who fell at home taking a blow to the head. He presents to [facility] for evaluation of headache and balance problems.    ED Course: Afebrile VSS. Lab revealed mild leukocytosis. Chemistries normal. CT head reveals SDH. EDP consulted Dr. Ellene Route, Neurosurgeon on call, who advised observation admission with f/u CT head later in the day and to stop Xarelto. [facility] called to admit patient.  Review of Systems: As per HPI otherwise 10 point review of systems negative.    Past Medical History:  Diagnosis Date  . ADHD, adult residual type    takes Ritalin daily  . Anxiety   . Arthritis   . Bipolar disorder ([place])    "a touch"  . Cutaneous abscess of left foot 04/09/2018  . Depression    takes Lexapro daily  . Hepatitis C    resolved with treatment 2019. released from hepatology  . Hypertension    takes Lisinopril,Metoprolol,and Amlodipine daily  . Insomnia    takes Melatonin nightly  . Left foot pain 04/09/2018  . Muscle spasm    takes Flexeril daily  . Neuropathy involving both lower extremities    takes Gabapentin daily  . Open wound of left foot 04/03/2018  . Pneumonia   . Septic arthritis of left foot ([place]) 04/03/2018    Past Surgical History:  Procedure Laterality Date  . AMPUTATION Left 10/21/2013   Procedure: LEFT FOOT SECOND RAY AMPUTATION ;  Surgeon: [name], MD;  Location: [place];  Service: Orthopedics;  Laterality: Left;  . AMPUTATION  Left 04/10/2018   Procedure: LEFT TRANSMETATARSAL AMPUTATION;  Surgeon: [name], MD;  Location: [place];  Service: Orthopedics;  Laterality: Left;  . BACK SURGERY  2006   Disc  . COLONOSCOPY    . PARATHYROIDECTOMY N/A 12/21/2013   Procedure: PARATHYROIDECTOMY;  Surgeon: Ralene Ok, MD;  Location: [place];  Service: General;  Laterality: N/A;  . SINUS EXPLORATION  2005/2007  . TEE WITHOUT CARDIOVERSION N/A 10/24/2013   Procedure: TRANSESOPHAGEAL ECHOCARDIOGRAM (TEE);  Surgeon: Fay Records, MD;  Location[facility];  Service: Cardiovascular;  Laterality: N/A;   Soc Hx - married x 2: 1st ended in divorce after 29 years; 2nd now 12 years. He has two children and two step-children with one child living at home. He worked as an Recruitment consultant until retirement.    reports that he quit smoking about 28 years ago. His smoking use included cigarettes and cigars. He quit after 21.00 years of use. He has never used smokeless tobacco. He reports that he does not drink alcohol and does not use drugs.  No Known Allergies  Family History  Problem Relation Age of Onset  . Hypertension Mother   . Hypertension Father   . Diabetes type II Neg Hx      Prior to Admission medications   Medication Sig Start Date End Date Taking? Authorizing Provider  acetaminophen (TYLENOL) 500 MG tablet Take 500 mg by mouth every 6 (six) hours as  needed for mild pain or headache.   Yes [provider]  amLODipine (NORVASC) 10 MG tablet TAKE 1 TABLET EVERY DAY Patient taking differently: Take 10 mg by mouth daily.  04/14/19  Yes Leamon Arnt, MD  ARNICA EX Apply 1 application topically 3 (three) times daily as needed (arthritis pain).   Yes [provider]  Ascorbic Acid (VITAMIN C) 1000 MG tablet Take 500 mg by mouth daily.    Yes [provider]  carvedilol (COREG) 12.5 MG tablet Take 1 tablet (12.5 mg total) by mouth 2 (two) times daily. 09/30/18  Yes Leamon Arnt, MD    cholecalciferol (VITAMIN D3) 25 MCG (1000 UT) tablet Take 1,000 Units by mouth daily.   Yes [provider]  Cyanocobalamin (VITAMIN B-12 PO) Take 1 tablet by mouth every 3 (three) days.   Yes [provider]  cyclobenzaprine (FLEXERIL) 10 MG tablet Take 10 mg by mouth 3 (three) times daily as needed for muscle spasms.   Yes [provider]  gabapentin (NEURONTIN) 300 MG capsule Take 1 capsule (300 mg total) by mouth 3 (three) times daily as needed. Patient taking differently: Take 300 mg by mouth 3 (three) times daily as needed (pain).  07/08/19  Yes Ladell Pier, MD  hydrochlorothiazide (HYDRODIURIL) 25 MG tablet TAKE 1 TABLET (25 MG TOTAL) BY MOUTH DAILY. 02/28/19  Yes Leamon Arnt, MD  Lenvatinib 12 mg daily dose (LENVIMA, 12 MG DAILY DOSE,) 3 x 4 MG capsule Take 12 mg by mouth daily. 07/08/19  Yes Ladell Pier, MD  levalbuterol [facility] HFA) 45 MCG/ACT inhaler Inhale 1 puff into the lungs every 6 (six) hours as needed for wheezing. 05/05/19  Yes Owens Shark, NP  lidocaine (LIDODERM) 5 % Place 1 patch onto the skin daily as needed (pain). Pain. Remove & Discard patch within 12 hours or as directed by MD   Yes [provider]  lisinopril (ZESTRIL) 40 MG tablet TAKE 1 TABLET (40 MG TOTAL) BY MOUTH DAILY. 04/14/19  Yes Leamon Arnt, MD  Melatonin 10 MG TABS Take 10 mg by mouth at bedtime.   Yes [provider]  Multiple Vitamins-Minerals (MULTIVITAMIN WITH MINERALS) tablet Take 1 tablet by mouth daily.   Yes [provider]  Oxycodone HCl 10 MG TABS Take 1 tablet (10 mg total) by mouth every 4 (four) hours as needed. Patient taking differently: Take 10 mg by mouth every 4 (four) hours as needed (pain).  06/29/19  Yes Owens Shark, NP  prochlorperazine (COMPAZINE) 10 MG tablet Take 1 tablet (10 mg total) by mouth every 6 (six) hours as needed for nausea. 04/29/19  Yes Ladell Pier, MD  rivaroxaban (XARELTO) 20 MG TABS tablet Take 1  tablet (20 mg total) by mouth daily with supper. 05/19/19  Yes Owens Shark, NP  sodium chloride (OCEAN) 0.65 % SOLN nasal spray Place 1 spray into both nostrils as needed for congestion.   Yes [provider]  Tetrahydrozoline HCl (VISINE OP) Place 1 drop into both eyes 3 (three) times daily as needed (dry eyes).   Yes [provider]  vitamin E (VITAMIN E) 180 MG (400 UNITS) capsule Take 400 Units by mouth daily.   Yes [provider]  citalopram (CELEXA) 10 MG tablet Take 1 tablet (10 mg total) by mouth daily. Patient not taking: Reported on 07/18/2019 07/08/19   Ladell Pier, MD    Physical Exam: Vitals:   07/18/19 0400 07/18/19  0411 07/18/19 0430 07/18/19 0500  BP: (!) 150/84  (!) 142/79 (!) 149/86  Pulse: 87  91 91  Resp: (!) 22  (!) 24 (!) 23  SpO2: 95%  94% 92%  Weight:  82.1 kg    Height:  6' (1.829 m)      Constitutional: NAD, calm, comfortable Vitals:   07/18/19 0400 07/18/19 0411 07/18/19 0430 07/18/19 0500  BP: (!) 150/84  (!) 142/79 (!) 149/86  Pulse: 87  91 91  Resp: (!) 22  (!) 24 (!) 23  SpO2: 95%  94% 92%  Weight:  82.1 kg    Height:  6' (1.829 m)     General:  WNWD man in no distress Eyes: PERRL, lids and conjunctivae normal w/o icterus ENMT: Mucous membranes are moist. Posterior pharynx clear of any exudate or lesions.Normal dentition.  Neck: normal, supple, no masses, no thyromegaly Respiratory: clear to auscultation bilaterally, no wheezing, no crackles. Normal respiratory effort. No accessory muscle use.  Cardiovascular: Regular rate and rhythm, no murmurs / rubs / gallops. No extremity edema. 2+ pedal pulses. No carotid bruits.  Abdomen:  Bowel sounds positive. Liver edge palpated at level of umbilicus, left lob also palpable. Tender to palpation. No guarding or rebound. No caput medusa or spider angiomas Musculoskeletal: no clubbing / cyanosis. No joint deformity upper  extremities. Good ROM, no contractures. Normal muscle  tone.  Skin: no rashes, lesions, ulcers. No induration Neurologic: CN 2-12 grossly intact. Strength 4/5 in all 4.  Psychiatric: Normal judgment and insight. Alert and oriented x 3. Normal mood.     Labs on Admission: I have personally reviewed following labs and imaging studies  CBC: Recent Labs  Lab 07/18/19 0217  WBC 14.6*  HGB 15.8  HCT 50.8  MCV 77.7*  PLT 96*   Basic Metabolic Panel: Recent Labs  Lab 07/18/19 0217  NA 130*  K 5.0  CL 95*  CO2 20*  GLUCOSE 90  BUN 20  CREATININE 0.70  CALCIUM 8.4*   GFR: Estimated Creatinine Clearance: 99.7 mL/min (by C-G formula based on SCr of 0.7 mg/dL). Liver Function Tests: No results for input(s): AST, ALT, ALKPHOS, BILITOT, PROT, ALBUMIN in the last 168 hours. No results for input(s): LIPASE, AMYLASE in the last 168 hours. No results for input(s): AMMONIA in the last 168 hours. Coagulation Profile: No results for input(s): INR, PROTIME in the last 168 hours. Cardiac Enzymes: No results for input(s): CKTOTAL, CKMB, CKMBINDEX, TROPONINI in the last 168 hours. BNP (last 3 results) No results for input(s): PROBNP in the last 8760 hours. HbA1C: No results for input(s): HGBA1C in the last 72 hours. CBG: Recent Labs  Lab 07/18/19 0212  GLUCAP 100*   Lipid Profile: No results for input(s): CHOL, HDL, LDLCALC, TRIG, CHOLHDL, LDLDIRECT in the last 72 hours. Thyroid Function Tests: No results for input(s): TSH, T4TOTAL, FREET4, T3FREE, THYROIDAB in the last 72 hours. Anemia Panel: No results for input(s): VITAMINB12, FOLATE, FERRITIN, TIBC, IRON, RETICCTPCT in the last 72 hours. Urine analysis:    Component Value Date/Time   COLORURINE YELLOW 05/02/2019 0918   APPEARANCEUR CLEAR 05/02/2019 0918   LABSPEC 1.015 05/02/2019 0918   PHURINE 6.0 05/02/2019 0918   GLUCOSEU NEGATIVE 05/02/2019 0918   HGBUR NEGATIVE 05/02/2019 0918   BILIRUBINUR NEGATIVE 05/02/2019 0918   BILIRUBINUR Negative 06/04/2018 1435   KETONESUR  NEGATIVE 05/02/2019 0918   PROTEINUR NEGATIVE 06/09/2019 1550   UROBILINOGEN 0.2 06/04/2018 1435   NITRITE NEGATIVE 05/02/2019 0918   LEUKOCYTESUR NEGATIVE  05/02/2019 0918    Radiological Exams on Admission: CT HEAD WO CONTRAST  Result Date: 07/18/2019 CLINICAL DATA:  [place], hit head, anticoagulated, hepatocellular carcinoma EXAM: CT HEAD WITHOUT CONTRAST TECHNIQUE: Contiguous axial images were obtained from the base of the skull through the vertex without intravenous contrast. COMPARISON:  None. FINDINGS: Brain: There is a posterior falcine subdural hematoma measuring up to 6 mm. The subdural extends along the right side tentorium. No mass effect. No acute infarct. Lateral ventricles and remaining midline structures are unremarkable. Vascular: No hyperdense vessel or unexpected calcification. Skull: Normal. Negative for fracture or focal lesion. Sinuses/Orbits: Minimal fluid within the left maxillary sinus. Remaining paranasal sinuses are clear. Other: None. IMPRESSION: 1. Small posterior falcine subdural hematoma extending along the right side tentorium. Maximal thickness measures 6 mm. No mass effect. These results were called by telephone at the time of interpretation on 07/18/2019 at 2:20 am to provider PA Quincy Carnes, who verbally acknowledged these results. Electronically Signed   By: Randa Ngo M.D.   On: 07/18/2019 02:21    EKG: Independently reviewed. No EKG done in ED  Assessment/Plan Active Problems:   SDH (subdural hematoma) (HCC)   Essential hypertension   MDD (major depressive disorder)   Hepatocellular carcinoma (HCC)  (please populate well all problems here in Problem List. (For example, if patient is on BP meds at home and you resume or decide to hold them, it is a problem that needs to be her. Same for CAD, COPD, HLD and so on)   1. SDH - small SDH 2/2 trauma. Patient neurologically intact Plan Xarelto stopped, KCENTRA ADMINISTERED  F/u CT head w/o contrast at 1500 hrs  07/18/19  2. Oncology - progressive metastatic hepatocarcinoma with pulmonary mets Plan contnue home regimen  3. HTN - adequate control - will continue present regimen.  DVT prophylaxis: TED hose  Code Status: full code  Family Communication: Wife let about 3 Am but was fully informed by the EDP. Patient asked that she not be disturbed and can talk later in the day.   Disposition Plan: Home 24 hrs if SDH stable Consults called: Neurosurgery - Dr. Ellene Route consulted the EDP by phone.  (with names) Admission status: obs   Adella Hare MD [facility] Pager [phone]  If 7PM-7AM, please contact night-coverage [email or web address] Password [email or web address]  07/18/2019, 5:20 AM

## 2019-07-18 NOTE — Progress Notes (Signed)
Oral Chemotherapy Hold Policy - Lenvatinib  Lenvatinib (Lenvima) . Grade 3 or 4 renal failure or impairment . Grade 3 or 4 hepatotoxicity or hepatic failure . Persistent and intolerable grade 2 or 3 adverse reactions . Grade 4 laboratory abnormalities . Arterial thrombotic event . Grade 3 or 4 cardiac dysfunction . Grade 3 hypertension, persisting despite optimal medical management . Grade ? 3 QT prolongation . GI perforation or life-threatening fistula . Grade ? 3 hemorrhage . Nephrotic syndrome . ? 2 g proteinuria/24 hours . Reversible posterior leukoencephalopathy syndrome . Active infection  A/P: Patient admitted after fall with head subdural hematoma. Will hold chemo med per policy above for hemorrhage.   Adrian Saran, PharmD, BCPS 07/18/2019 9:25 AM

## 2019-07-18 NOTE — ED Notes (Signed)
Report called to [facility] RN 4th floor.

## 2019-07-18 NOTE — Progress Notes (Signed)
Patient's wife calls to let us know that patient fell and hit his head has a small "brain bleed".   Currently admitted to [facility] room 1428.  Dr. Benay Spice was made aware.

## 2019-07-18 NOTE — Progress Notes (Signed)
PT Cancellation Note  Patient Details Name: William Solis MRN: [id number] DOB: 05/28/1950   Cancelled Treatment:    Reason Eval/Treat Not Completed: Medical issues which prohibited therapy, Noted for CT F/U, Palliative C/S. Will check back tomorrow.    Claretha Cooper 07/18/2019, 1:51 PM Tresa Endo PT Acute Rehabilitation Services Pager [phone] Office [phone]

## 2019-07-18 NOTE — ED Notes (Signed)
Urine Culture sent with UA

## 2019-07-18 NOTE — Progress Notes (Addendum)
William Solis is a 69 y.o. male with a history of hepatic carcinoma with pulmonary metastasis, cirrhosis, hypertension, HCV, bipolar disorder, recently diagnosed PE Xarelto who was admitted early this morning by Dr. Linda Hedges s/p fall and head injury resulting in SDH on CT scan.  Neurosurgery was called and advised observation admission with follow-up CT head and hold Xarelto.  Patient was given Kcentra.  Patient states that he has had increasing generalized weakness and was noted to have hypoxia on ambulation at recent oncology visit on 6/18 and was placed on oxygen therapy at home.  Additionally, he recently started therapy with Lenvima (lenvatinib) on 07/12/2019   PE: General: Awake and alert, no acute distress  Heart: S1 and S2 auscultated, no murmurs  Lungs: Clear to auscultation bilaterally, no wheeze  Neuro: Cranial nerves intact, reflexes intact   A/P  1. SDH s/p mechanical fall s/p Kcentra a. Follow-up repeat CTA head this afternoon b. Holding Xarelto 2. Generalized weakness Likely multifactorial: Possibly from hyponatremia and/or recently starting lenvatinib versus underlying malignancy  a. Reports he is mostly bedbound now b. PT c. IV fluids 3. Hyponatremia a. IV fluids 4. Bilateral segmental PE diagnosed 05/02/2019 a. Holding Xarelto in setting of SDH b. PE still present on recent CT chest (06/28/2019) 5. History of hepatic carcinoma with pulmonary metastasis a. Recently prescribed a trial of lenvatinib b. Discussed with Dr. Benay Spice today, hold chemo for today as it increases risk of bleeding c. Palliative care consult to consider hospice - discussed with patient and wife at bedside  Discussed with wife at bedside  Harold Hedge, [place] [facility] Pager [phone]

## 2019-07-18 NOTE — ED Provider Notes (Signed)
[facility] DEPT Provider Note  CSN: [id number] Arrival date & time: 07/18/19 0122  Chief Complaint(s) Fall, Head Injury, and Weakness  HPI William Solis is a 69 y.o. male with extensive past medical history listed below including hepatocellular carcinoma metastatic to the lungs who was recently diagnosed with PEs on Xarelto presents to the emergency department after a fall and head trauma.  Patient reports that he has intermittent jerks with his lower and upper extremities which caused him to be off balance.  While in the bathroom this evening, his knees gave out causing him to fall backwards striking his head.  He denied any loss of consciousness.  He is endorsing mild headache.  Denies any other injuries.  No new chest pain or shortness of breath, abdominal pain, nausea vomiting.  No other physical complaints, other than chronic back pain.  HPI  Past Medical History Past Medical History:  Diagnosis Date   ADHD, adult residual type    takes Ritalin daily   Anxiety    Arthritis    Bipolar disorder ([place])    "a touch"   Cutaneous abscess of left foot 04/09/2018   Depression    takes Lexapro daily   Hepatitis C    resolved with treatment 2019. released from hepatology   Hypertension    takes Lisinopril,Metoprolol,and Amlodipine daily   Insomnia    takes Melatonin nightly   Left foot pain 04/09/2018   Muscle spasm    takes Flexeril daily   Neuropathy involving both lower extremities    takes Gabapentin daily   Open wound of left foot 04/03/2018   Pneumonia    Septic arthritis of left foot ([place]) 04/03/2018   Patient Active Problem List   Diagnosis Date Noted   SDH (subdural hematoma) ([place]) 07/18/2019   Hepatocellular carcinoma ([place]) 04/25/2019   Goals of care, counseling/discussion 04/25/2019   Tremor 09/30/2018   S/P transmetatarsal amputation of foot, left ([place]) 04/10/2018   Osteomyelitis of left foot ([place]) 04/03/2018    Postlaminectomy syndrome 04/01/2017   Lumbar stenosis 03/09/2017   Fibromyalgia 09/15/2016   Risk for falls 06/25/2016   OSA on CPAP 11/07/2015   Bilateral edema of lower extremity 10/12/2015   S/P removal of parathyroid gland ([place]) 12/21/2013   History of hyperparathyroidism 11/22/2013   History of osteomyelitis 10/25/2013   Chronic low back pain 05/12/2012   Narcotic dependence ([place]) 05/12/2012   History of hepatitis C 04/19/2010   Essential hypertension 04/19/2010   Dry eye syndrome 11/20/2008   MDD (major depressive disorder) 11/20/2008   ADD (attention deficit disorder) 12/01/2006   Home Medication(s) Prior to Admission medications   Medication Sig Start Date End Date Taking? Authorizing Provider  acetaminophen (TYLENOL) 500 MG tablet Take 500 mg by mouth every 6 (six) hours as needed for mild pain or headache.   Yes [provider]  amLODipine (NORVASC) 10 MG tablet TAKE 1 TABLET EVERY DAY Patient taking differently: Take 10 mg by mouth daily.  04/14/19  Yes Leamon Arnt, MD  ARNICA EX Apply 1 application topically 3 (three) times daily as needed (arthritis pain).   Yes [provider]  Ascorbic Acid (VITAMIN C) 1000 MG tablet Take 500 mg by mouth daily.    Yes [provider]  carvedilol (COREG) 12.5 MG tablet Take 1 tablet (12.5 mg total) by mouth 2 (two) times daily. 09/30/18  Yes Leamon Arnt, MD  cholecalciferol (VITAMIN D3) 25 MCG (1000 UT) tablet Take 1,000 Units by mouth  daily.   Yes [provider]  Cyanocobalamin (VITAMIN B-12 PO) Take 1 tablet by mouth every 3 (three) days.   Yes [provider]  cyclobenzaprine (FLEXERIL) 10 MG tablet Take 10 mg by mouth 3 (three) times daily as needed for muscle spasms.   Yes [provider]  gabapentin (NEURONTIN) 300 MG capsule Take 1 capsule (300 mg total) by mouth 3 (three) times daily as needed. Patient taking differently: Take 300 mg by mouth 3 (three)  times daily as needed (pain).  07/08/19  Yes Ladell Pier, MD  hydrochlorothiazide (HYDRODIURIL) 25 MG tablet TAKE 1 TABLET (25 MG TOTAL) BY MOUTH DAILY. 02/28/19  Yes Leamon Arnt, MD  Lenvatinib 12 mg daily dose (LENVIMA, 12 MG DAILY DOSE,) 3 x 4 MG capsule Take 12 mg by mouth daily. 07/08/19  Yes Ladell Pier, MD  levalbuterol [facility] HFA) 45 MCG/ACT inhaler Inhale 1 puff into the lungs every 6 (six) hours as needed for wheezing. 05/05/19  Yes Owens Shark, NP  lidocaine (LIDODERM) 5 % Place 1 patch onto the skin daily as needed (pain). Pain. Remove & Discard patch within 12 hours or as directed by MD   Yes [provider]  lisinopril (ZESTRIL) 40 MG tablet TAKE 1 TABLET (40 MG TOTAL) BY MOUTH DAILY. 04/14/19  Yes Leamon Arnt, MD  Melatonin 10 MG TABS Take 10 mg by mouth at bedtime.   Yes [provider]  Multiple Vitamins-Minerals (MULTIVITAMIN WITH MINERALS) tablet Take 1 tablet by mouth daily.   Yes [provider]  Oxycodone HCl 10 MG TABS Take 1 tablet (10 mg total) by mouth every 4 (four) hours as needed. Patient taking differently: Take 10 mg by mouth every 4 (four) hours as needed (pain).  06/29/19  Yes Owens Shark, NP  prochlorperazine (COMPAZINE) 10 MG tablet Take 1 tablet (10 mg total) by mouth every 6 (six) hours as needed for nausea. 04/29/19  Yes Ladell Pier, MD  rivaroxaban (XARELTO) 20 MG TABS tablet Take 1 tablet (20 mg total) by mouth daily with supper. 05/19/19  Yes Owens Shark, NP  sodium chloride (OCEAN) 0.65 % SOLN nasal spray Place 1 spray into both nostrils as needed for congestion.   Yes [provider]  Tetrahydrozoline HCl (VISINE OP) Place 1 drop into both eyes 3 (three) times daily as needed (dry eyes).   Yes [provider]  vitamin E (VITAMIN E) 180 MG (400 UNITS) capsule Take 400 Units by mouth daily.   Yes [provider]  citalopram (CELEXA) 10 MG tablet Take 1 tablet (10 mg total) by mouth  daily. Patient not taking: Reported on 07/18/2019 07/08/19   Ladell Pier, MD                                                                                                                                    Past Surgical  History Past Surgical History:  Procedure Laterality Date   AMPUTATION Left 10/21/2013   Procedure: LEFT FOOT SECOND RAY AMPUTATION ;  Surgeon: [name], MD;  Location: [facility];  Service: Orthopedics;  Laterality: Left;   AMPUTATION Left 04/10/2018   Procedure: LEFT TRANSMETATARSAL AMPUTATION;  Surgeon: [name], MD;  Location: [facility];  Service: Orthopedics;  Laterality: Left;   BACK SURGERY  2006   Disc   COLONOSCOPY     PARATHYROIDECTOMY N/A 12/21/2013   Procedure: PARATHYROIDECTOMY;  Surgeon: Ralene Ok, MD;  Location: [facility];  Service: General;  Laterality: N/A;   SINUS EXPLORATION  2005/2007   TEE WITHOUT CARDIOVERSION N/A 10/24/2013   Procedure: TRANSESOPHAGEAL ECHOCARDIOGRAM (TEE);  Surgeon: Fay Records, MD;  [facility] END[facility];  Service: Cardiovascular;  Laterality: N/A;   Family History Family History  Problem Relation Age of Onset   Hypertension Mother    Hypertension Father    Diabetes type II Neg Hx     Social History Social History   Tobacco Use   Smoking status: Former Smoker    Years: 21.00    Types: Cigarettes, Cigars    Quit date: 10/20/1990    Years since quitting: 28.7   Smokeless tobacco: Never Used  Vaping Use   Vaping Use: Never used  Substance Use Topics   Alcohol use: No    Alcohol/week: 0.0 standard drinks   Drug use: No   Allergies Patient has no known allergies.  Review of Systems Review of Systems All other systems are reviewed and are negative for acute change except as noted in the HPI  Physical Exam Vital Signs  I have reviewed the triage vital signs BP 130/75 (BP Location: Left Arm)    Pulse 86    Resp (!) 23    Ht 6' (1.829 m)    Wt 82.1 kg    SpO2 93%    BMI 24.55 kg/m   Physical  Exam Vitals reviewed.  Constitutional:      General: He is not in acute distress.    Appearance: He is well-developed. He is not diaphoretic.  HENT:     Head: Normocephalic. Contusion present. No laceration.      Nose: Nose normal.  Eyes:     General: No scleral icterus.       Right eye: No discharge.        Left eye: No discharge.     Conjunctiva/sclera: Conjunctivae normal.     Pupils: Pupils are equal, round, and reactive to light.  Cardiovascular:     Rate and Rhythm: Normal rate and regular rhythm.     Heart sounds: No murmur heard.  No friction rub. No gallop.   Pulmonary:     Effort: Pulmonary effort is normal. No respiratory distress.     Breath sounds: Normal breath sounds. No stridor. No rales.  Abdominal:     General: There is distension.     Palpations: Abdomen is soft.     Tenderness: There is no abdominal tenderness.  Musculoskeletal:        General: No tenderness.     Cervical back: Normal range of motion and neck supple.  Skin:    General: Skin is warm and dry.     Coloration: Skin is jaundiced.     Findings: No erythema or rash.     Comments: Spider hemangiomas throughout face and torso  Neurological:     Mental Status: He is alert and oriented to person, place, and  time.     Comments: asterixis     ED Results and Treatments Labs (all labs ordered are listed, but only abnormal results are displayed) Labs Reviewed  BASIC METABOLIC PANEL - Abnormal; Notable for the following components:      Result Value   Sodium 130 (*)    Chloride 95 (*)    CO2 20 (*)    Calcium 8.4 (*)    All other components within normal limits  CBC - Abnormal; Notable for the following components:   WBC 14.6 (*)    RBC 6.54 (*)    MCV 77.7 (*)    MCH 24.2 (*)    RDW 21.5 (*)    Platelets 96 (*)    nRBC 0.3 (*)    All other components within normal limits  CBG MONITORING, ED - Abnormal; Notable for the following components:   Glucose-Capillary 100 (*)    All other  components within normal limits  SARS CORONAVIRUS 2 BY RT PCR (HOSPITAL ORDER, [facility])  URINALYSIS, ROUTINE W REFLEX MICROSCOPIC                                                                                                                         EKG  EKG Interpretation  Date/Time:    Ventricular Rate:    PR Interval:    QRS Duration:   QT Interval:    QTC Calculation:   R Axis:     Text Interpretation:        Radiology CT HEAD WO CONTRAST  Result Date: 07/18/2019 CLINICAL DATA:  [place], hit head, anticoagulated, hepatocellular carcinoma EXAM: CT HEAD WITHOUT CONTRAST TECHNIQUE: Contiguous axial images were obtained from the base of the skull through the vertex without intravenous contrast. COMPARISON:  None. FINDINGS: Brain: There is a posterior falcine subdural hematoma measuring up to 6 mm. The subdural extends along the right side tentorium. No mass effect. No acute infarct. Lateral ventricles and remaining midline structures are unremarkable. Vascular: No hyperdense vessel or unexpected calcification. Skull: Normal. Negative for fracture or focal lesion. Sinuses/Orbits: Minimal fluid within the left maxillary sinus. Remaining paranasal sinuses are clear. Other: None. IMPRESSION: 1. Small posterior falcine subdural hematoma extending along the right side tentorium. Maximal thickness measures 6 mm. No mass effect. These results were called by telephone at the time of interpretation on 07/18/2019 at 2:20 am to provider PA Quincy Carnes, who verbally acknowledged these results. Electronically Signed   By: Randa Ngo M.D.   On: 07/18/2019 02:21    Pertinent labs & imaging results that were available during my care of the patient were reviewed by me and considered in my medical decision making (see chart for details).  Medications Ordered in ED Medications  acetaminophen (TYLENOL) tablet 500 mg (has no administration in time range)  oxyCODONE (Oxy  IR/ROXICODONE) immediate release tablet 10 mg (has no administration in time range)  Lenvatinib 12 mg daily dose (LENVIMA) capsule 12 mg (has  no administration in time range)  amLODipine (NORVASC) tablet 10 mg (has no administration in time range)  carvedilol (COREG) tablet 12.5 mg (has no administration in time range)  hydrochlorothiazide (HYDRODIURIL) tablet 25 mg (has no administration in time range)  lisinopril (ZESTRIL) tablet 40 mg (has no administration in time range)  prochlorperazine (COMPAZINE) tablet 10 mg (has no administration in time range)  Melatonin TABS 10 mg (has no administration in time range)  cyclobenzaprine (FLEXERIL) tablet 10 mg (has no administration in time range)  gabapentin (NEURONTIN) capsule 300 mg (has no administration in time range)  levalbuterol (XOPENEX HFA) inhaler 1 puff (has no administration in time range)  sodium chloride (OCEAN) 0.65 % nasal spray 1 spray (has no administration in time range)  tetrahydrozoline 0.05 % ophthalmic solution 1 drop (has no administration in time range)  sodium chloride flush (NS) 0.9 % injection 3 mL (has no administration in time range)  sodium chloride flush (NS) 0.9 % injection 3 mL (has no administration in time range)  0.9 %  sodium chloride infusion (has no administration in time range)  sodium chloride flush (NS) 0.9 % injection 3 mL (3 mLs Intravenous Given 07/18/19 0200)  fentaNYL (SUBLIMAZE) injection 50 mcg (50 mcg Intravenous Given 07/18/19 0259)  prothrombin complex conc human (KCENTRA) IVPB 4,116 Units (0 Units Intravenous Stopped 07/18/19 0409)  HYDROmorphone (DILAUDID) injection 0.5 mg (0.5 mg Intravenous Given 07/18/19 0401)  0.9 %  sodium chloride infusion ( Intravenous New Bag/Given 07/18/19 0411)                                                                                                                                    Procedures .Critical Care Performed by: Fatima Blank, MD Authorized by:  Fatima Blank, MD    CRITICAL CARE Performed by: Grayce Sessions Kieron Kantner Total critical care time: 50 minutes Critical care time was exclusive of separately billable procedures and treating other patients. Critical care was necessary to treat or prevent imminent or life-threatening deterioration. Critical care was time spent personally by me on the following activities: development of treatment plan with patient and/or surrogate as well as nursing, discussions with consultants, evaluation of patient's response to treatment, examination of patient, obtaining history from patient or surrogate, ordering and performing treatments and interventions, ordering and review of laboratory studies, ordering and review of radiographic studies, pulse oximetry and re-evaluation of patient's condition.    (including critical care time)  Medical Decision Making / ED Course I have reviewed the nursing notes for this encounter and the patient's prior records (if available in EHR or on provided paperwork).   William Solis was evaluated in Emergency Department on 07/18/2019 for the symptoms described in the history of present illness. He was evaluated in the context of the global COVID-19 pandemic, which necessitated consideration that the patient might be at risk for infection with the SARS-CoV-2 virus that causes COVID-19. Institutional  protocols and algorithms that pertain to the evaluation of patients at risk for COVID-19 are in a state of rapid change based on information released by regulatory bodies including the CDC and federal and state organizations. These policies and algorithms were followed during the patient's care in the ED.  Fall resulting in head trauma on anticoagulation.  Patient is hemodynamically stable.  CT head notable for small posterior falx subdural hematoma.  Kcentra ordered.  Labs close to his baseline and reassuring.  Case discussed with Dr. Ellene Route from neurosurgery who felt  patient can stay here at [facility] long to be admitted and have repeat CT scan.  No emergent surgical intervention necessary at this time.  Patient admitted to medicine      Final Clinical Impression(s) / ED Diagnoses Final diagnoses:  SDH (subdural hematoma) ([place])      This chart was dictated using voice recognition software.  Despite best efforts to proofread,  errors can occur which can change the documentation meaning.   Fatima Blank, MD 07/18/19 [phone]

## 2019-07-19 DIAGNOSIS — S065X9A Traumatic subdural hemorrhage with loss of consciousness of unspecified duration, initial encounter: Secondary | ICD-10-CM | POA: Diagnosis present

## 2019-07-19 DIAGNOSIS — G8929 Other chronic pain: Secondary | ICD-10-CM | POA: Diagnosis present

## 2019-07-19 DIAGNOSIS — E440 Moderate protein-calorie malnutrition: Secondary | ICD-10-CM | POA: Diagnosis present

## 2019-07-19 DIAGNOSIS — Z9221 Personal history of antineoplastic chemotherapy: Secondary | ICD-10-CM

## 2019-07-19 DIAGNOSIS — R600 Localized edema: Secondary | ICD-10-CM | POA: Diagnosis not present

## 2019-07-19 DIAGNOSIS — E871 Hypo-osmolality and hyponatremia: Secondary | ICD-10-CM | POA: Diagnosis present

## 2019-07-19 DIAGNOSIS — Z20822 Contact with and (suspected) exposure to covid-19: Secondary | ICD-10-CM | POA: Diagnosis present

## 2019-07-19 DIAGNOSIS — W19XXXA Unspecified fall, initial encounter: Secondary | ICD-10-CM | POA: Diagnosis present

## 2019-07-19 DIAGNOSIS — G253 Myoclonus: Secondary | ICD-10-CM | POA: Diagnosis present

## 2019-07-19 DIAGNOSIS — S0093XA Contusion of unspecified part of head, initial encounter: Secondary | ICD-10-CM

## 2019-07-19 DIAGNOSIS — K7460 Unspecified cirrhosis of liver: Secondary | ICD-10-CM | POA: Diagnosis present

## 2019-07-19 DIAGNOSIS — G4700 Insomnia, unspecified: Secondary | ICD-10-CM | POA: Diagnosis present

## 2019-07-19 DIAGNOSIS — Z515 Encounter for palliative care: Secondary | ICD-10-CM

## 2019-07-19 DIAGNOSIS — F909 Attention-deficit hyperactivity disorder, unspecified type: Secondary | ICD-10-CM | POA: Diagnosis present

## 2019-07-19 DIAGNOSIS — J9621 Acute and chronic respiratory failure with hypoxia: Secondary | ICD-10-CM | POA: Diagnosis not present

## 2019-07-19 DIAGNOSIS — R531 Weakness: Secondary | ICD-10-CM | POA: Diagnosis not present

## 2019-07-19 DIAGNOSIS — R7989 Other specified abnormal findings of blood chemistry: Secondary | ICD-10-CM | POA: Diagnosis not present

## 2019-07-19 DIAGNOSIS — Z86711 Personal history of pulmonary embolism: Secondary | ICD-10-CM | POA: Diagnosis not present

## 2019-07-19 DIAGNOSIS — M1990 Unspecified osteoarthritis, unspecified site: Secondary | ICD-10-CM | POA: Diagnosis present

## 2019-07-19 DIAGNOSIS — R42 Dizziness and giddiness: Secondary | ICD-10-CM

## 2019-07-19 DIAGNOSIS — B1920 Unspecified viral hepatitis C without hepatic coma: Secondary | ICD-10-CM | POA: Diagnosis present

## 2019-07-19 DIAGNOSIS — Z66 Do not resuscitate: Secondary | ICD-10-CM

## 2019-07-19 DIAGNOSIS — Z8619 Personal history of other infectious and parasitic diseases: Secondary | ICD-10-CM | POA: Diagnosis not present

## 2019-07-19 DIAGNOSIS — F319 Bipolar disorder, unspecified: Secondary | ICD-10-CM | POA: Diagnosis present

## 2019-07-19 DIAGNOSIS — Y92012 Bathroom of single-family (private) house as the place of occurrence of the external cause: Secondary | ICD-10-CM | POA: Diagnosis not present

## 2019-07-19 DIAGNOSIS — F419 Anxiety disorder, unspecified: Secondary | ICD-10-CM | POA: Diagnosis present

## 2019-07-19 DIAGNOSIS — I2699 Other pulmonary embolism without acute cor pulmonale: Secondary | ICD-10-CM | POA: Diagnosis not present

## 2019-07-19 DIAGNOSIS — C220 Liver cell carcinoma: Secondary | ICD-10-CM

## 2019-07-19 DIAGNOSIS — R627 Adult failure to thrive: Secondary | ICD-10-CM | POA: Diagnosis present

## 2019-07-19 DIAGNOSIS — C7800 Secondary malignant neoplasm of unspecified lung: Secondary | ICD-10-CM | POA: Diagnosis present

## 2019-07-19 DIAGNOSIS — Z6825 Body mass index (BMI) 25.0-25.9, adult: Secondary | ICD-10-CM | POA: Diagnosis not present

## 2019-07-19 DIAGNOSIS — M48061 Spinal stenosis, lumbar region without neurogenic claudication: Secondary | ICD-10-CM | POA: Diagnosis present

## 2019-07-19 DIAGNOSIS — I10 Essential (primary) hypertension: Secondary | ICD-10-CM | POA: Diagnosis present

## 2019-07-19 DIAGNOSIS — R042 Hemoptysis: Secondary | ICD-10-CM | POA: Diagnosis not present

## 2019-07-19 DIAGNOSIS — M62838 Other muscle spasm: Secondary | ICD-10-CM | POA: Diagnosis present

## 2019-07-19 DIAGNOSIS — G4733 Obstructive sleep apnea (adult) (pediatric): Secondary | ICD-10-CM | POA: Diagnosis present

## 2019-07-19 LAB — BASIC METABOLIC PANEL
Anion gap: 10 (ref 5–15)
BUN: 25 mg/dL — ABNORMAL HIGH (ref 8–23)
CO2: 23 mmol/L (ref 22–32)
Calcium: 7.8 mg/dL — ABNORMAL LOW (ref 8.9–10.3)
Chloride: 95 mmol/L — ABNORMAL LOW (ref 98–111)
Creatinine, Ser: 0.94 mg/dL (ref 0.61–1.24)
GFR calc Af Amer: >60 mL/min (ref >60)
GFR calc non Af Amer: >60 mL/min (ref >60)
Glucose, Bld: 73 mg/dL (ref 70–99)
Potassium: 4.1 mmol/L (ref 3.5–5.1)
Sodium: 128 mmol/L — ABNORMAL LOW (ref 135–145)

## 2019-07-19 LAB — CBC
HCT: 46.1 % (ref 39.0–52.0)
Hemoglobin: 14 g/dL (ref 13.0–17.0)
MCH: 24.2 pg — ABNORMAL LOW (ref 26.0–34.0)
MCHC: 30.4 g/dL (ref 30.0–36.0)
MCV: 79.6 fL — ABNORMAL LOW (ref 80.0–100.0)
Platelets: 112 10*3/uL — ABNORMAL LOW (ref 150–400)
RBC: 5.79 MIL/uL (ref 4.22–5.81)
RDW: 21.5 % — ABNORMAL HIGH (ref 11.5–15.5)
WBC: 13.6 10*3/uL — ABNORMAL HIGH (ref 4.0–10.5)
nRBC: 0.1 % (ref 0.0–0.2)

## 2019-07-19 LAB — MAGNESIUM: Magnesium: 2 mg/dL (ref 1.7–2.4)

## 2019-07-19 MED ORDER — ADULT MULTIVITAMIN W/MINERALS CH
1.0000 | ORAL_TABLET | Freq: Every day | ORAL | Status: DC
  Administered 2019-07-19 – 2019-07-26 (×8): 1 via ORAL
  Filled 2019-07-19 (×8): qty 1

## 2019-07-19 MED ORDER — ALBUTEROL SULFATE (2.5 MG/3ML) 0.083% IN NEBU: 2.5000 mg | INHALATION_SOLUTION | Freq: Four times a day (QID) | RESPIRATORY_TRACT | Status: DC | PRN

## 2019-07-19 MED ORDER — SODIUM CHLORIDE 0.9 % IV SOLN: INTRAVENOUS | Status: AC

## 2019-07-19 MED ORDER — HYDRALAZINE HCL 20 MG/ML IJ SOLN: 10.0000 mg | Freq: Four times a day (QID) | INTRAMUSCULAR | Status: DC | PRN

## 2019-07-19 MED ORDER — LENVATINIB (12 MG DAILY DOSE) 3 X 4 MG PO CPPK
12.0000 mg | ORAL_CAPSULE | Freq: Every day | ORAL | Status: DC
  Administered 2019-07-19 – 2019-07-25 (×7): 12 mg via ORAL
  Filled 2019-07-19: qty 12

## 2019-07-19 NOTE — Progress Notes (Addendum)
HEMATOLOGY-ONCOLOGY PROGRESS NOTE  SUBJECTIVE: William Solis was admitted following a fall where he struck his head.  CT of the head showed subdural hematoma.  Neurosurgery consulted who recommended observation with follow-up head CT and to stop Xarelto.  The patient reports that he is feeling better today.  Reports that he still has some dizziness with coughing.  He is not having any headaches today.  Palliative care consult has been requested and is pending.  We attempted to contact his wife on his cell phone during our visit, but she was unable to participate in our discussion at that time.  Oncology History  Hepatocellular carcinoma ([place])  04/25/2019 Initial Diagnosis   Hepatocellular carcinoma ([place])   04/28/2019 - 06/09/2019 Chemotherapy   The patient had atezolizumab (TECENTRIQ) 1,200 mg in sodium chloride 0.9 % 250 mL chemo infusion, 1,200 mg, Intravenous, Once, 3 of 6 cycles Administration: 1,200 mg (04/28/2019), 1,200 mg (05/19/2019), 1,200 mg (06/09/2019) bevacizumab-bvzr (ZIRABEV) 1,400 mg in sodium chloride 0.9 % 100 mL chemo infusion, 15 mg/kg = 1,400 mg, Intravenous,  Once, 3 of 6 cycles Administration: 1,400 mg (04/28/2019), 1,400 mg (05/19/2019), 1,400 mg (06/09/2019)  for chemotherapy treatment.     PHYSICAL EXAMINATION:  Vitals:   07/18/19 2113 07/19/19 0614  BP: (!) 164/125 128/79  Pulse: 91 83  Resp: 16 (!) 21  Temp: 97.9 F (36.6 C) (!) 97.5 F (36.4 C)  SpO2: 91% 94%   Filed Weights   07/18/19 0411 07/18/19 0958  Weight: 82.1 kg 86.2 kg    Intake/Output from previous day: 06/28 0701 - 06/29 0700 In: 1120 [P.O.:120; I.V.:1000] Out: 550 [Urine:550]  GENERAL: Awake and alert, no distress LUNGS: clear to auscultation and percussion with normal breathing effort HEART: regular rate & rhythm  ABDOMEN: Fullness in the right upper abdomen NEURO: alert & oriented x 3 with fluent speech, no focal motor/sensory deficits  LABORATORY DATA:  I have reviewed the data as  listed CMP Latest Ref Rng & Units 07/19/2019 07/18/2019 06/28/2019  Glucose 70 - 99 mg/dL 73 90 91  BUN 8 - 23 mg/dL 25(H) 20 12  Creatinine 0.61 - 1.24 mg/dL 0.94 0.70 0.78  Sodium 135 - 145 mmol/L 128(L) 130(L) 133(L)  Potassium 3.5 - 5.1 mmol/L 4.1 5.0 3.9  Chloride 98 - 111 mmol/L 95(L) 95(L) 95(L)  CO2 22 - 32 mmol/L 23 20(L) 23  Calcium 8.9 - 10.3 mg/dL 7.8(L) 8.4(L) 9.0  Total Protein 6.5 - 8.1 g/dL - - 7.9  Total Bilirubin 0.3 - 1.2 mg/dL - - 1.6(H)  Alkaline Phos 38 - 126 U/L - - 284(H)  AST 15 - 41 U/L - - 129(H)  ALT 0 - 44 U/L - - 80(H)    Lab Results  Component Value Date   WBC 13.6 (H) 07/19/2019   HGB 14.0 07/19/2019   HCT 46.1 07/19/2019   MCV 79.6 (L) 07/19/2019   PLT 112 (L) 07/19/2019   NEUTROABS 8.5 (H) 06/28/2019    CT HEAD WO CONTRAST  Result Date: 07/18/2019 CLINICAL DATA:  Subdural hematoma, follow-up EXAM: CT HEAD WITHOUT CONTRAST TECHNIQUE: Contiguous axial images were obtained from the base of the skull through the vertex without intravenous contrast. COMPARISON:  07/18/2019 FINDINGS: Brain: Small subdural hematoma along the posterior falx measuring 4 mm compared to 6 mm previously. Blood extends along the right side of the tentorium. No mass effect or midline shift. No new areas of hemorrhage. Vascular: No hyperdense vessel or unexpected calcification. Skull: No acute calvarial abnormality. Sinuses/Orbits: Visualized paranasal  sinuses and mastoids clear. Orbital soft tissues unremarkable. Other: None IMPRESSION: Small subdural hematoma along the posterior falx and right tentorium, slightly decreased in size since prior study. Electronically Signed   By: Rolm Baptise M.D.   On: 07/18/2019 15:11   CT HEAD WO CONTRAST  Result Date: 07/18/2019 CLINICAL DATA:  [place], hit head, anticoagulated, hepatocellular carcinoma EXAM: CT HEAD WITHOUT CONTRAST TECHNIQUE: Contiguous axial images were obtained from the base of the skull through the vertex without intravenous  contrast. COMPARISON:  None. FINDINGS: Brain: There is a posterior falcine subdural hematoma measuring up to 6 mm. The subdural extends along the right side tentorium. No mass effect. No acute infarct. Lateral ventricles and remaining midline structures are unremarkable. Vascular: No hyperdense vessel or unexpected calcification. Skull: Normal. Negative for fracture or focal lesion. Sinuses/Orbits: Minimal fluid within the left maxillary sinus. Remaining paranasal sinuses are clear. Other: None. IMPRESSION: 1. Small posterior falcine subdural hematoma extending along the right side tentorium. Maximal thickness measures 6 mm. No mass effect. These results were called by telephone at the time of interpretation on 07/18/2019 at 2:20 am to provider PA Quincy Carnes, who verbally acknowledged these results. Electronically Signed   By: Randa Ngo M.D.   On: 07/18/2019 02:21   CT Chest W Contrast  Result Date: 06/28/2019 CLINICAL DATA:  Restage multifocal HCC EXAM: CT CHEST, ABDOMEN, AND PELVIS WITH CONTRAST TECHNIQUE: Multidetector CT imaging of the chest, abdomen and pelvis was performed following the standard protocol during bolus administration of intravenous contrast. Multiphasic contrast enhanced imaging was performed of the abdomen CONTRAST:  133mL OMNIPAQUE IOHEXOL 300 MG/ML SOLN, additional oral enteric contrast COMPARISON:  05/02/2019 FINDINGS: CT CHEST FINDINGS Cardiovascular: Aortic atherosclerosis. Normal heart size. No pericardial effusion. Although this examination is not tailored for the evaluation of pulmonary embolism, there is some evidence of persistent embolus, particularly in the segmental arteries of the right upper lobe (series 7, image 40) and left lower lobe (series 7, image 59). Mediastinum/Nodes: Calcified right hilar and subcarinal lymph nodes. Thyroid gland, trachea, and esophagus demonstrate no significant findings. Lungs/Pleura: There are innumerable new and enlarged pulmonary nodules,  an index nodule in the anterior right upper lobe measuring 1.1 cm, previously 0.7 cm (series 11, image 60). No pleural effusion or pneumothorax. Musculoskeletal: No chest wall mass or suspicious bone lesions identified. CT ABDOMEN PELVIS FINDINGS Hepatobiliary: Hepatomegaly and coarse contour of the liver. Slight interval enlargement of very bulky liver masses, dominant mass in the central right lobe of the liver measuring at least 16.5 x 13.4 cm, previously 15.8 x 12.6 cm when measured similarly (series 7, image 109). There are multiple new and enlarged much smaller lesions throughout the liver, for example in the liver dome a lesion measuring 1.1 cm, previously no greater than 0.7 cm (series 7, image 78). No gallstones, gallbladder wall thickening, or biliary dilatation. Pancreas: Unremarkable. No pancreatic ductal dilatation or surrounding inflammatory changes. Spleen: Mild splenomegaly, maximum coronal span 14.2 cm. Numerous splenic parenchymal calcifications in keeping with prior granulomatous infection. Adrenals/Urinary Tract: Adrenal glands are unremarkable. Kidneys are normal, without renal calculi, solid lesion, or hydronephrosis. Bladder is unremarkable. Stomach/Bowel: Stomach is within normal limits. Appendix is not clearly visualized. No evidence of bowel wall thickening, distention, or inflammatory changes. Sigmoid diverticulosis. Vascular/Lymphatic: Aortic atherosclerosis. No enlarged abdominal or pelvic lymph nodes. Reproductive: No mass or other abnormality. Other: No abdominal wall hernia or abnormality. Trace ascites in the abdomen. Musculoskeletal: No acute or significant osseous findings. IMPRESSION: 1. Slight interval enlargement  of very bulky liver masses. There are multiple new and enlarged much smaller lesions throughout the liver. Findings are consistent with worsened multifocal hepatocellular carcinoma. 2. There are innumerable new and enlarged pulmonary nodules. Findings are consistent  with worsened pulmonary metastatic disease. 3. Stigmata of cirrhosis and portal hypertension. 4. Trace ascites in the abdomen. 5. Although this examination is not tailored for the evaluation of pulmonary embolism, there is some evidence of persistent embolus noted on CT angiogram dated 05/02/2019. No obvious new embolus appreciated. 6. Aortic Atherosclerosis (ICD10-I70.0). Electronically Signed   By: Eddie Candle M.D.   On: 06/28/2019 16:21   CT Abdomen Pelvis W Contrast  Result Date: 06/28/2019 CLINICAL DATA:  Restage multifocal HCC EXAM: CT CHEST, ABDOMEN, AND PELVIS WITH CONTRAST TECHNIQUE: Multidetector CT imaging of the chest, abdomen and pelvis was performed following the standard protocol during bolus administration of intravenous contrast. Multiphasic contrast enhanced imaging was performed of the abdomen CONTRAST:  163mL OMNIPAQUE IOHEXOL 300 MG/ML SOLN, additional oral enteric contrast COMPARISON:  05/02/2019 FINDINGS: CT CHEST FINDINGS Cardiovascular: Aortic atherosclerosis. Normal heart size. No pericardial effusion. Although this examination is not tailored for the evaluation of pulmonary embolism, there is some evidence of persistent embolus, particularly in the segmental arteries of the right upper lobe (series 7, image 40) and left lower lobe (series 7, image 59). Mediastinum/Nodes: Calcified right hilar and subcarinal lymph nodes. Thyroid gland, trachea, and esophagus demonstrate no significant findings. Lungs/Pleura: There are innumerable new and enlarged pulmonary nodules, an index nodule in the anterior right upper lobe measuring 1.1 cm, previously 0.7 cm (series 11, image 60). No pleural effusion or pneumothorax. Musculoskeletal: No chest wall mass or suspicious bone lesions identified. CT ABDOMEN PELVIS FINDINGS Hepatobiliary: Hepatomegaly and coarse contour of the liver. Slight interval enlargement of very bulky liver masses, dominant mass in the central right lobe of the liver measuring at  least 16.5 x 13.4 cm, previously 15.8 x 12.6 cm when measured similarly (series 7, image 109). There are multiple new and enlarged much smaller lesions throughout the liver, for example in the liver dome a lesion measuring 1.1 cm, previously no greater than 0.7 cm (series 7, image 78). No gallstones, gallbladder wall thickening, or biliary dilatation. Pancreas: Unremarkable. No pancreatic ductal dilatation or surrounding inflammatory changes. Spleen: Mild splenomegaly, maximum coronal span 14.2 cm. Numerous splenic parenchymal calcifications in keeping with prior granulomatous infection. Adrenals/Urinary Tract: Adrenal glands are unremarkable. Kidneys are normal, without renal calculi, solid lesion, or hydronephrosis. Bladder is unremarkable. Stomach/Bowel: Stomach is within normal limits. Appendix is not clearly visualized. No evidence of bowel wall thickening, distention, or inflammatory changes. Sigmoid diverticulosis. Vascular/Lymphatic: Aortic atherosclerosis. No enlarged abdominal or pelvic lymph nodes. Reproductive: No mass or other abnormality. Other: No abdominal wall hernia or abnormality. Trace ascites in the abdomen. Musculoskeletal: No acute or significant osseous findings. IMPRESSION: 1. Slight interval enlargement of very bulky liver masses. There are multiple new and enlarged much smaller lesions throughout the liver. Findings are consistent with worsened multifocal hepatocellular carcinoma. 2. There are innumerable new and enlarged pulmonary nodules. Findings are consistent with worsened pulmonary metastatic disease. 3. Stigmata of cirrhosis and portal hypertension. 4. Trace ascites in the abdomen. 5. Although this examination is not tailored for the evaluation of pulmonary embolism, there is some evidence of persistent embolus noted on CT angiogram dated 05/02/2019. No obvious new embolus appreciated. 6. Aortic Atherosclerosis (ICD10-I70.0). Electronically Signed   By: Eddie Candle M.D.   On:  06/28/2019 16:21    ASSESSMENT  AND PLAN: 1. Hepatocellular carcinoma  Ultrasound abdomen 04/11/1999-2 solid hepatic masses, largest measuring 15 cm  MRI liver 04/12/2019-cirrhosis, dominant central hepatic mass, dominant segment 6 mass, several small enhancing lesions in the bilateral liver, imaging features consistent with LR-5 criteria  Cycle 1 atezolizumab/Avastin 04/28/2019  CT chest 05/02/2019-segmental pulmonary emboli within the right upper and left lower lobes. Innumerable bilateral pulmonary nodules.  CT abdomen/pelvis 05/02/2019-stable multifocal hepatocellular carcinoma, stable portacaval adenopathy  Cycle 2 atezolizumab/Avastin 05/19/2019  Cycle 3 atezolizumab/Avastin 06/09/2019  CTs 06/28/2019-interval enlargement of bulky liver masses.  Multiple new and enlarged much smaller lesions throughout the liver.  Innumerable new and enlarged pulmonary nodules.  Lenvatinib started 07/14/2019 2. History of hepatitis C, treatedin 2019 3. Depression 4. ADHD 5. Lower extremity neuropathy 6. Hypertension 7. Status post partial left foot amputation 8. Chest CT 05/02/2019-segmental pulmonary emboli within the right upper and left lower lobes, minimal clot burden no evidence of right heart strain. Started on Xarelto, discontinued on hospital admission 07/18/2019 9. Hospital admission 07/18/2019 for subdural hematoma following a fall  Mr. Rubin is now admitted for a subdural hematoma following a fall.  Xarelto has been stopped.  Repeat CT scan showed slightly decreased subdural hematoma compared to prior study.  Other than dizziness with coughing, he is asymptomatic.  Lenvatinib was placed on hold due to concern for increased risk of bleeding.  Recommendations: 1.  Okay to resume lenvatinib from our standpoint. 2.  Agree with palliative care consult for goals of care discussion. 3.  Continue to hold Xarelto, resume when appropriate per neurosurgery   LOS: 0 days   Mikey Bussing, DNP,  AGPCNP-BC, AOCNP 07/19/19' Mr. Weinkauf was interviewed and examined. He is known to me with a history advanced stage hepatocellular carcinoma. He began a trial of second line therapy with lenvatinib last week. He is admitted following a fall and was noted to have a subdural hematoma. He appears asymptomatic from the CNS hemorrhage. Xarelto anticoagulation has been placed on hold.  I recommend resuming lenvatinib. Mr. Dimartino and his wife understand the poor prognosis. The plan is to refer him for hospice care if his clinical status does not improve on lenvatinib. I recommend at least a 1-48-month trial of lenvatinib prior to assessing clinical response. We can make a hospice referral sooner if his clinical status declines.  Xarelto anticoagulation can be resumed when appropriate per neurosurgery.  Outpatient follow-up will be scheduled at the Cancer center.

## 2019-07-19 NOTE — Progress Notes (Signed)
PROGRESS NOTE    William Solis    Code Status: DNR  HGD:[id number] DOB: 1950-07-15 DOA: 07/18/2019 LOS: 0 days  PCP: Leamon Arnt, MD CC:  Chief Complaint  Patient presents with  . Fall  . Head Injury  . Weakness       Hospital Summary   William Solis is a 69 y.o. male with a history of hepatic carcinoma with pulmonary metastasis, cirrhosis follows with Dr. Benay Spice who was recently started on lenvatinib, hypertension, HCV, bipolar disorder, recently diagnosed PE Xarelto was recently started on O2 on 6/18 progressively worsening generalized weakness who was admitted on 6/28 s/p fall and head injury resulting in SDH on CT scan.  Neurosurgery was called and advised observation admission with follow-up CT head and hold Xarelto.  Patient was given Kcentra.  Follow-up CT scan: Small SDH slightly decreased in size  Due to the patient's history of progressive cancer with recently starting chemotherapy and progressive generalized weakness, PT, oncology and palliative care were consulted.    A & P   Active Problems:   Essential hypertension   MDD (major depressive disorder)   Hepatocellular carcinoma (HCC)   SDH (subdural hematoma) (HCC)   Failure to thrive in adult   1. SDH s/p mechanical fall s/p Kcentra a. Neurologically intact b. Follow-up CT head with small SDH slightly decreased in size c. Continue holding Xarelto  2. Generalized weakness/failure to thrive Likely multifactorial: Possibly from hyponatremia and/or recently starting lenvatinib in addition to progressively worsening malignancy  a. mostly bedbound now b. PT recommending SNF c. Continue IV fluids  3. Hyponatremia a. IV fluids b. Hold lisinopril and HCTZ  4. Bilateral segmental PE diagnosed 05/02/2019 with hypoxia a. Holding Xarelto in setting of SDH b. PE still present on recent CT chest (06/28/2019) c. Started on O2 therapy on 6/18 currently at baseline d. Xarelto can be resumed when appropriate  per neurosurgery  5. Hypertension a. Continue home amlodipine Coreg b. Hold lisinopril and HCTZ in setting of hyponatremia c. Add on as needed hydralazine in place of held antihypertensives  6. History of hepatic carcinoma with pulmonary metastasis a. Recently prescribed a trial of lenvatinib b. Chemo held on admission, restarted on 6/29 per oncology recommendations.  Oncology recommends continuing with chemo x1 to 2 months prior to assessing clinical response c. Palliative care consulted  7. Wheeze a. Add on albuterol  DVT prophylaxis: Place TED hose Start: 07/18/19 0515   Family Communication: Patient's wife has been updated   Disposition Plan: Discharge pending improvement in hyponatremia and palliative consult.  Will need plan regarding Xarelto at discharge.  Likely medically stable for discharge in 24 to 48 hours  Status is: Inpatient  Remains inpatient appropriate because:IV treatments appropriate due to intensity of illness or inability to take PO and Pending SNF placement   Dispo: The patient is from: Home              Anticipated d/c is to: SNF              Anticipated d/c date is: 1 day              Patient currently is not medically stable to d/c.          Pressure injury documentation    None  Consultants  Oncology Palliative care   Procedures  None  Antibiotics   Anti-infectives (From admission, onward)   None        Subjective   Patient seen  and examined in no acute distress and resting comfortably in chair with wife on the phone. No acute events overnight. PT at bedside. Denies any acute complaints at this time. Tolerating diet well.   Objective   Vitals:   07/18/19 1735 07/18/19 2113 07/19/19 0614 07/19/19 1429  BP: (!) 144/80 (!) 164/125 128/79 115/71  Pulse: 84 91 83 86  Resp: 20 16 (!) 21 17  Temp: 98.7 F (37.1 C) 97.9 F (36.6 C) (!) 97.5 F (36.4 C) 98.9 F (37.2 C)  TempSrc: Oral Oral Oral Oral  SpO2: 95% 91% 94% 90%    Weight:      Height:        Intake/Output Summary (Last 24 hours) at 07/19/2019 1514 Last data filed at 07/19/2019 1400 Gross per 24 hour  Intake 717.19 ml  Output 400 ml  Net 317.19 ml   Filed Weights   07/18/19 0411 07/18/19 0958  Weight: 82.1 kg 86.2 kg    Examination:  Physical Exam Vitals and nursing note reviewed. Exam conducted with a chaperone present.  Constitutional:      Comments: Chronically ill-appearing  HENT:     Head: Normocephalic and atraumatic.  Eyes:     Conjunctiva/sclera: Conjunctivae normal.  Cardiovascular:     Rate and Rhythm: Normal rate and regular rhythm.  Pulmonary:     Effort: Pulmonary effort is normal.     Breath sounds: Wheezing present.  Abdominal:     General: Abdomen is flat.     Palpations: Abdomen is soft.  Musculoskeletal:        General: No swelling or tenderness.  Skin:    Coloration: Skin is not jaundiced or pale.  Neurological:     Mental Status: He is alert. Mental status is at baseline.  Psychiatric:        Mood and Affect: Mood normal.        Behavior: Behavior normal.     Data Reviewed: I have personally reviewed following labs and imaging studies  CBC: Recent Labs  Lab 07/18/19 0217 07/19/19 0432  WBC 14.6* 13.6*  HGB 15.8 14.0  HCT 50.8 46.1  MCV 77.7* 79.6*  PLT 96* 892*   Basic Metabolic Panel: Recent Labs  Lab 07/18/19 0217 07/19/19 0432  NA 130* 128*  K 5.0 4.1  CL 95* 95*  CO2 20* 23  GLUCOSE 90 73  BUN 20 25*  CREATININE 0.70 0.94  CALCIUM 8.4* 7.8*  MG  --  2.0   GFR: Estimated Creatinine Clearance: 84.8 mL/min (by C-G formula based on SCr of 0.94 mg/dL). Liver Function Tests: No results for input(s): AST, ALT, ALKPHOS, BILITOT, PROT, ALBUMIN in the last 168 hours. No results for input(s): LIPASE, AMYLASE in the last 168 hours. No results for input(s): AMMONIA in the last 168 hours. Coagulation Profile: No results for input(s): INR, PROTIME in the last 168 hours. Cardiac  Enzymes: No results for input(s): CKTOTAL, CKMB, CKMBINDEX, TROPONINI in the last 168 hours. BNP (last 3 results) No results for input(s): PROBNP in the last 8760 hours. HbA1C: No results for input(s): HGBA1C in the last 72 hours. CBG: Recent Labs  Lab 07/18/19 0212  GLUCAP 100*   Lipid Profile: No results for input(s): CHOL, HDL, LDLCALC, TRIG, CHOLHDL, LDLDIRECT in the last 72 hours. Thyroid Function Tests: No results for input(s): TSH, T4TOTAL, FREET4, T3FREE, THYROIDAB in the last 72 hours. Anemia Panel: No results for input(s): VITAMINB12, FOLATE, FERRITIN, TIBC, IRON, RETICCTPCT in the last 72 hours. Sepsis Labs: No  results for input(s): PROCALCITON, LATICACIDVEN in the last 168 hours.  Recent Results (from the past 240 hour(s))  SARS Coronavirus 2 by RT PCR (hospital order, performed in [facility] hospital lab) Nasopharyngeal Nasopharyngeal Swab     Status: None   Collection Time: 07/18/19  4:14 AM   Specimen: Nasopharyngeal Swab  Result Value Ref Range Status   SARS Coronavirus 2 NEGATIVE NEGATIVE Final    Comment: (NOTE) SARS-CoV-2 target nucleic acids are NOT DETECTED.  The SARS-CoV-2 RNA is generally detectable in upper and lower respiratory specimens during the acute phase of infection. The lowest concentration of SARS-CoV-2 viral copies this assay can detect is 250 copies / mL. A negative result does not preclude SARS-CoV-2 infection and should not be used as the sole basis for treatment or other patient management decisions.  A negative result may occur with improper specimen collection / handling, submission of specimen other than nasopharyngeal swab, presence of viral mutation(s) within the areas targeted by this assay, and inadequate number of viral copies (<250 copies / mL). A negative result must be combined with clinical observations, patient history, and epidemiological information.  Fact Sheet for Patients:    StrictlyIdeas.no  Fact Sheet for Healthcare Providers: BankingDealers.co.za  This test is not yet approved or  cleared by the [place] FDA and has been authorized for detection and/or diagnosis of SARS-CoV-2 by FDA under an Emergency Use Authorization (EUA).  This EUA will remain in effect (meaning this test can be used) for the duration of the COVID-19 declaration under Section 564(b)(1) of the Act, 21 U.S.C. section 360bbb-3(b)(1), unless the authorization is terminated or revoked sooner.  Performed at [facility], [place] [address], [place], [place] [address]          Radiology Studies: CT HEAD WO CONTRAST  Result Date: 07/18/2019 CLINICAL DATA:  Subdural hematoma, follow-up EXAM: CT HEAD WITHOUT CONTRAST TECHNIQUE: Contiguous axial images were obtained from the base of the skull through the vertex without intravenous contrast. COMPARISON:  07/18/2019 FINDINGS: Brain: Small subdural hematoma along the posterior falx measuring 4 mm compared to 6 mm previously. Blood extends along the right side of the tentorium. No mass effect or midline shift. No new areas of hemorrhage. Vascular: No hyperdense vessel or unexpected calcification. Skull: No acute calvarial abnormality. Sinuses/Orbits: Visualized paranasal sinuses and mastoids clear. Orbital soft tissues unremarkable. Other: None IMPRESSION: Small subdural hematoma along the posterior falx and right tentorium, slightly decreased in size since prior study. Electronically Signed   By: Rolm Baptise M.D.   On: 07/18/2019 15:11   CT HEAD WO CONTRAST  Result Date: 07/18/2019 CLINICAL DATA:  [place], hit head, anticoagulated, hepatocellular carcinoma EXAM: CT HEAD WITHOUT CONTRAST TECHNIQUE: Contiguous axial images were obtained from the base of the skull through the vertex without intravenous contrast. COMPARISON:  None. FINDINGS: Brain: There is a posterior falcine subdural  hematoma measuring up to 6 mm. The subdural extends along the right side tentorium. No mass effect. No acute infarct. Lateral ventricles and remaining midline structures are unremarkable. Vascular: No hyperdense vessel or unexpected calcification. Skull: Normal. Negative for fracture or focal lesion. Sinuses/Orbits: Minimal fluid within the left maxillary sinus. Remaining paranasal sinuses are clear. Other: None. IMPRESSION: 1. Small posterior falcine subdural hematoma extending along the right side tentorium. Maximal thickness measures 6 mm. No mass effect. These results were called by telephone at the time of interpretation on 07/18/2019 at 2:20 am to provider PA Quincy Carnes, who verbally acknowledged these results. Electronically  Signed   By: Randa Ngo M.D.   On: 07/18/2019 02:21        Scheduled Meds: . amLODipine  10 mg Oral Daily  . carvedilol  12.5 mg Oral BID  . feeding supplement (ENSURE ENLIVE)  237 mL Oral BID BM  . gabapentin  300 mg Oral TID  . Lenvatinib 12 mg daily dose  12 mg Oral Daily  . mouth rinse  15 mL Mouth Rinse BID  . melatonin  10 mg Oral QHS  . sodium chloride flush  3 mL Intravenous Q12H  . tetrahydrozoline  1 drop Both Eyes TID   Continuous Infusions: . sodium chloride    . sodium chloride 100 mL/hr at 07/19/19 1438     Time spent: 25 minutes with over 50% of the time coordinating the patient's care    Harold Hedge, DO [facility] Pager [phone]  Call night coverage person covering after 7pm

## 2019-07-19 NOTE — Consult Note (Signed)
Consultation Note Date: 07/19/2019   Patient Name: William Solis  DOB: 03-05-1950  MRN: [id number]  Age / Sex: 69 y.o., male  PCP: William Arnt, MD Referring Physician: Harold Hedge, MD  Reason for Consultation: Establishing goals of care and Psychosocial/spiritual support  HPI/Patient Profile: 69 y.o. male  admitted on  07/18/2019 with past medical history significant of hepatocarcinoma daignosed in March 2021/disease progression following Atezolizumab/Avastin, cirrhosis, asterixis, HTN, Hep C, bipolar disorder who fell at home taking a blow to the head.   Understanding that disease is not curable he was started on trial of Lenvatinib hoping for prolonged quality survival time under Dr William Solis care.  He presents to [facility] for evaluation of headache and balance problems.    Head CT significant for small SDH.  Family reports continued physical and functional decline over the past few weeks.     Patient and family  face treatment option decisions, advanced directive decisions and anticipatory care needs.  Clinical Assessment and Goals of Care:   This NP William Solis reviewed medical records, received report from team, assessed the patient and then meet at the patient's bedside ( and returned later in the afternoon) with his wife/William Solis) to discuss diagnosis, prognosis, GOC, EOL wishes disposition and options.   Concept of Palliative Care was introduced as specialized medical care for people and their families living with serious illness.  If focuses on providing relief from the symptoms and stress of a serious illness.  The goal is to improve quality of life for both the patient and the family.  Created space and opportunity for patient and family to explore thoughts and feeling regarding patient's current medical situation.   A  discussion was had today regarding advanced directives.  Concepts  specific to code status, artifical feeding and hydration, continued IV antibiotics and rehospitalization was had.    The difference between a aggressive medical intervention path  and a palliative comfort care path for this patient at this time was had.  Both patient and his wife verbalize understanding of the seriousness of the current medical situation and the likely poor prognosis, however at this time patient "wants to try something", is hopeful for prolonged life. He is open to offered and available medical interventions to prolong life.     Quality is a priority.      Values and goals of care important to patient and family were attempted to be elicited.   MOST form introduced    Questions and concerns addressed.  Patient  encouraged to call with questions or concerns.     PMT will continue to support holistically.     Paperwork brought in from wife AD/Living will and HPOA copied and placed in hard chart    SUMMARY OF RECOMMENDATIONS    Code Status/Advance Care Planning:  DNR-documented today per patient and wife supports his decision.    Symptom Management:  Patient reports the pain is well controlled currently, continue  Pain: Oxycodone 10 mg every 4 hours as needed for pain  Tylenol 500 mg p.o. every 6 hours as needed for mild pain or headache  Palliative Prophylaxis:   Bowel Regimen, Delirium Protocol, Frequent Pain Assessment and Oral Care  Additional Recommendations (Limitations, Scope, Preferences):  Patient is hopeful for continued quality life, he is open for continued treatment for his cancer diagnosis.  He is willing to try SNF for rehab if eligible.  Psycho-social/Spiritual:   Desire for further Chaplaincy support:  declined  Additional Recommendations: Education on Hospice  Prognosis:   Will depend on desire for life prolonging measures  Discharge Planning: Patient is anticipating SNF for rehab, however he and his wife understand this  will depend on response to current medical interventions over the next days to weeks      Primary Diagnoses: Present on Admission:  Essential hypertension  MDD (major depressive disorder)  Hepatocellular carcinoma ([place])  SDH (subdural hematoma) ([place])   I have reviewed the medical record, interviewed the patient and family, and examined the patient. The following aspects are pertinent.  Past Medical History:  Diagnosis Date   ADHD, adult residual type    takes Ritalin daily   Anxiety    Arthritis    Bipolar disorder ([place])    "a touch"   Cutaneous abscess of left foot 04/09/2018   Depression    takes Lexapro daily   Hepatitis C    resolved with treatment 2019. released from hepatology   Hypertension    takes Lisinopril,Metoprolol,and Amlodipine daily   Insomnia    takes Melatonin nightly   Left foot pain 04/09/2018   Muscle spasm    takes Flexeril daily   Neuropathy involving both lower extremities    takes Gabapentin daily   Open wound of left foot 04/03/2018   Pneumonia    Septic arthritis of left foot ([place]) 04/03/2018   Social History   Socioeconomic History   Marital status: Married    Spouse name: Not on file   Number of children: Not on file   Years of education: Not on file   Highest education level: Not on file  Occupational History   Occupation: retired     Fish farm manager: [employer]  Tobacco Use   Smoking status: Former Smoker    Years: 21.00    Types: Cigarettes, Cigars    Quit date: 10/20/1990    Years since quitting: 28.7   Smokeless tobacco: Never Used  Vaping Use   Vaping Use: Never used  Substance and Sexual Activity   Alcohol use: No    Alcohol/week: 0.0 standard drinks   Drug use: No   Sexual activity: Never  Other Topics Concern   Not on file  Social History Narrative   Vegetarian    1 dog    Social Determinants of Health   Financial Resource Strain:    Difficulty of Paying Living Expenses:     Food Insecurity:    Worried About Charity fundraiser in the Last Year:    Arboriculturist in the Last Year:   Transportation Needs:    Film/video editor (Medical):    Lack of Transportation (Non-Medical):   Physical Activity:    Days of Exercise per Week:    Minutes of Exercise per Session:   Stress:    Feeling of Stress :   Social Connections:    Frequency of Communication with Friends and Family:    Frequency of Social Gatherings with Friends and Family:    Attends Religious Services:  Active Member of Clubs or Organizations:    Attends Music therapist:    Marital Status:    Family History  Problem Relation Age of Onset   Hypertension Mother    Hypertension Father    Diabetes type II Neg Hx    Scheduled Meds:  amLODipine  10 mg Oral Daily   carvedilol  12.5 mg Oral BID   feeding supplement (ENSURE ENLIVE)  237 mL Oral BID BM   gabapentin  300 mg Oral TID   hydrochlorothiazide  25 mg Oral Daily   lisinopril  40 mg Oral Daily   mouth rinse  15 mL Mouth Rinse BID   melatonin  10 mg Oral QHS   sodium chloride flush  3 mL Intravenous Q12H   tetrahydrozoline  1 drop Both Eyes TID   Continuous Infusions:  sodium chloride     PRN Meds:.sodium chloride, acetaminophen, benzonatate, cyclobenzaprine, guaiFENesin-dextromethorphan, levalbuterol, oxyCODONE, prochlorperazine, sodium chloride, sodium chloride flush Medications Prior to Admission:  Prior to Admission medications   Medication Sig Start Date End Date Taking? Authorizing Provider  acetaminophen (TYLENOL) 500 MG tablet Take 500 mg by mouth every 6 (six) hours as needed for mild pain or headache.   Yes [provider]  amLODipine (NORVASC) 10 MG tablet TAKE 1 TABLET EVERY DAY Patient taking differently: Take 10 mg by mouth daily.  04/14/19  Yes William Arnt, MD  ARNICA EX Apply 1 application topically 3 (three) times daily as needed (arthritis pain).   Yes  [provider]  Ascorbic Acid (VITAMIN C) 1000 MG tablet Take 500 mg by mouth daily.    Yes [provider]  carvedilol (COREG) 12.5 MG tablet Take 1 tablet (12.5 mg total) by mouth 2 (two) times daily. 09/30/18  Yes William Arnt, MD  cholecalciferol (VITAMIN D3) 25 MCG (1000 UT) tablet Take 1,000 Units by mouth daily.   Yes [provider]  Cyanocobalamin (VITAMIN B-12 PO) Take 1 tablet by mouth every 3 (three) days.   Yes [provider]  cyclobenzaprine (FLEXERIL) 10 MG tablet Take 10 mg by mouth 3 (three) times daily as needed for muscle spasms.   Yes [provider]  gabapentin (NEURONTIN) 300 MG capsule Take 1 capsule (300 mg total) by mouth 3 (three) times daily as needed. Patient taking differently: Take 300 mg by mouth 3 (three) times daily as needed (pain).  07/08/19  Yes Ladell Pier, MD  hydrochlorothiazide (HYDRODIURIL) 25 MG tablet TAKE 1 TABLET (25 MG TOTAL) BY MOUTH DAILY. 02/28/19  Yes William Arnt, MD  Lenvatinib 12 mg daily dose (LENVIMA, 12 MG DAILY DOSE,) 3 x 4 MG capsule Take 12 mg by mouth daily. 07/08/19  Yes Ladell Pier, MD  levalbuterol [facility] HFA) 45 MCG/ACT inhaler Inhale 1 puff into the lungs every 6 (six) hours as needed for wheezing. 05/05/19  Yes Owens Shark, NP  lidocaine (LIDODERM) 5 % Place 1 patch onto the skin daily as needed (pain). Pain. Remove & Discard patch within 12 hours or as directed by MD   Yes [provider]  lisinopril (ZESTRIL) 40 MG tablet TAKE 1 TABLET (40 MG TOTAL) BY MOUTH DAILY. 04/14/19  Yes William Arnt, MD  Melatonin 10 MG TABS Take 10 mg by mouth at bedtime.   Yes [provider]  Multiple Vitamins-Minerals (MULTIVITAMIN WITH MINERALS) tablet Take 1 tablet by mouth daily.   Yes [provider]  Oxycodone HCl 10 MG TABS Take 1  tablet (10 mg total) by mouth every 4 (four) hours as needed. Patient taking differently: Take 10 mg by mouth every 4 (four) hours  as needed (pain).  06/29/19  Yes Owens Shark, NP  prochlorperazine (COMPAZINE) 10 MG tablet Take 1 tablet (10 mg total) by mouth every 6 (six) hours as needed for nausea. 04/29/19  Yes Ladell Pier, MD  rivaroxaban (XARELTO) 20 MG TABS tablet Take 1 tablet (20 mg total) by mouth daily with supper. 05/19/19  Yes Owens Shark, NP  sodium chloride (OCEAN) 0.65 % SOLN nasal spray Place 1 spray into both nostrils as needed for congestion.   Yes [provider]  Tetrahydrozoline HCl (VISINE OP) Place 1 drop into both eyes 3 (three) times daily as needed (dry eyes).   Yes [provider]  vitamin E (VITAMIN E) 180 MG (400 UNITS) capsule Take 400 Units by mouth daily.   Yes [provider]  citalopram (CELEXA) 10 MG tablet Take 1 tablet (10 mg total) by mouth daily. Patient not taking: Reported on 07/18/2019 07/08/19   Ladell Pier, MD   No Known Allergies Review of Systems  Constitutional: Positive for fatigue.  Neurological: Positive for weakness.    Physical Exam Constitutional:      Appearance: He is underweight. He is ill-appearing.  Cardiovascular:     Rate and Rhythm: Normal rate.  Pulmonary:     Effort: Pulmonary effort is normal.  Skin:    General: Skin is warm and dry.  Neurological:     Mental Status: He is alert.     Vital Signs: BP 128/79 (BP Location: Left Arm)    Pulse 83    Temp (!) 97.5 F (36.4 C) (Oral)    Resp (!) 21    Ht 6' (1.829 m)    Wt 86.2 kg    SpO2 94%    BMI 25.77 kg/m  Pain Scale: 0-10   Pain Score: 5    SpO2: SpO2: 94 % O2 Device:SpO2: 94 % O2 Flow Rate: .O2 Flow Rate (L/min): 3 L/min  IO: Intake/output summary:   Intake/Output Summary (Last 24 hours) at 07/19/2019 0750 Last data filed at 07/18/2019 1630 Gross per 24 hour  Intake 1120 ml  Output 550 ml  Net 570 ml    LBM: Last BM Date: 07/17/19 Baseline Weight: Weight: 82.1 kg Most recent weight: Weight: 86.2 kg     Palliative Assessment/Data: 30 %        PMT will continue to support holistically  Discussed with bedside RN   Time In: 0800 Time Out: 1915 Time Total: 75 minutes Greater than 50%  of this time was spent counseling and coordinating care related to the above assessment and plan.  Signed by: William Lessen, NP   Please contact Palliative Medicine Team phone at [phone] for questions and concerns.  For individual provider: See Shea Evans

## 2019-07-19 NOTE — Progress Notes (Signed)
Initial Nutrition Assessment  DOCUMENTATION CODES:   Non-severe (moderate) malnutrition in context of chronic illness  INTERVENTION:   Magic cup TID with meals, each supplement provides 290 kcal and 9 grams of protein  MVI daily  Continue Ensure Enlive po BID, each supplement provides 350 kcal and 20 grams of protein  NUTRITION DIAGNOSIS:   Moderate Malnutrition related to chronic illness (cancer) as evidenced by energy intake < 75% for > or equal to 1 month, mild muscle depletion, mild fat depletion, percent weight loss, moderate fat depletion.    GOAL:   Patient will meet greater than or equal to 90% of their needs    MONITOR:   PO intake, Supplement acceptance, Weight trends, Labs, I & O's  REASON FOR ASSESSMENT:   Malnutrition Screening Tool    ASSESSMENT:   Pt admitted with small SDH s/p fall. PMH includes hepatocarcinoma progressive with pulmonary mets, cirrhosis, asterixis, HTN, Hep C, bipolar disorder.  Pt states he has had a poor appetite for ~1.5 months. He reports eating 1-2 meals/d and drinking one Ensure each day. Pt agreeable to trying two Ensures per day. Discussed methods for increasing calorie/protein intake.  Per wt readings, pt with a 9% wt loss x3 months, which is significant for time frame.   PO Intake: 100% x2 recorded meals   UOP: 595ml x24 hours I/O: +1,447.77ml since admit  Labs: Na 128 (L) Medications: Ensure Enlive BID  NUTRITION - FOCUSED PHYSICAL EXAM:    Most Recent Value  Orbital Region Mild depletion  Upper Arm Region Mild depletion  Thoracic and Lumbar Region Mild depletion  Buccal Region Moderate depletion  Temple Region Mild depletion  Clavicle Bone Region Mild depletion  Clavicle and Acromion Bone Region Mild depletion  Scapular Bone Region Mild depletion  Dorsal Hand Mild depletion  Patellar Region Mild depletion  Anterior Thigh Region Mild depletion  Posterior Calf Region Mild depletion  Edema (RD Assessment) Mild   Hair Reviewed  Eyes Reviewed  Mouth Reviewed  Skin Reviewed  Nails Reviewed       Diet Order:   Diet Order            Diet regular Room service appropriate? Yes; Fluid consistency: Thin  Diet effective now                 EDUCATION NEEDS:   Education needs have been addressed  Skin:  Skin Assessment: Reviewed RN Assessment  Last BM:  6/29  Height:   Ht Readings from Last 1 Encounters:  07/18/19 6' (1.829 m)    Weight:   Wt Readings from Last 10 Encounters:  07/18/19 86.2 kg  07/08/19 86.6 kg  06/29/19 88 kg  06/09/19 91.7 kg  05/19/19 95.8 kg  05/05/19 99.5 kg  05/02/19 95.3 kg  04/25/19 95.3 kg  04/15/19 95.2 kg  03/30/19 94.1 kg    BMI:  Body mass index is 25.77 kg/m.  Estimated Nutritional Needs:   Kcal:  1423-9532  Protein:  115-130 grams  Fluid:  >2L    Larkin Ina, MS, RD, LDN RD pager number and weekend/on-call pager number located in [place].

## 2019-07-19 NOTE — Discharge Instructions (Signed)
[facility] Stay Proper nutrition can help your body recover from illness and injury.   Foods and beverages high in protein, vitamins, and minerals help rebuild muscle loss, promote healing, & reduce fall risk.   The protein powder we discussed that can be added to liquids, soups, oatmeal, grits, etc is Unjury unflavored protein  In addition to eating healthy foods, a nutrition shake is an easy, delicious way to get the nutrition you need during and after your hospital stay  It is recommended that you continue to drink 2 bottles per day of:       Ensure enlive for at least 1 month (30 days) after your hospital stay   Tips for adding a nutrition shake into your routine: As allowed, drink one with vitamins or medications instead of water or juice Enjoy one as a tasty mid-morning or afternoon snack Drink cold or make a milkshake out of it Drink one instead of milk with cereal or snacks Use as a coffee creamer   Available at the following grocery stores and pharmacies:           * [facility] [place] [phone]            For COUPONS visit: www.ensure.com/join or [email or web address]-[email or web address]   Suggested Substitutions Ensure Plus = Boost Plus = Carnation Breakfast Essentials = Boost Compact Ensure Active Clear = Boost Breeze Glucerna Shake = Boost Glucose Control = Carnation Breakfast Essentials SUGAR FREE

## 2019-07-19 NOTE — Evaluation (Signed)
Physical Therapy Evaluation Patient Details Name: William Solis MRN: [id number] DOB: [birth year] Today's Date: 07/19/2019   History of Present Illness  William Solis is a 69 y.o. male with a history of hepatic carcinoma with pulmonary metastasis, cirrhosis, hypertension, HCV, bipolar disorder, recently diagnosed PE Xarelto who was admitted 07/18/19 s/p fall and head injury resulting in SDH on CT scan.  Clinical Impression  The patient is very weak, noted  Jerking of arms, spilling coffee, also jerking legs. Patient ambulated x 15' on 3 L [place], spo2 89%. The patient currently requires assistance to ambulate safely. Patient will require 24/7 assistance. Patient's wife on speaker phone during the PT eval. Patient may benefit from post acute Rehab. Noted Palliative medicine is consulting. Pt admitted with above diagnosis.  Pt currently with functional limitations due to the deficits listed below (see PT Problem List). Pt will benefit from skilled PT to increase their independence and safety with mobility to allow discharge to the venue listed below.      Follow Up Recommendations SNF;Supervision/Assistance - 24 hour    Equipment Recommendations    none  Recommendations for Other Services   OT    Precautions / Restrictions Precautions Precautions: Fall Precaution Comments: UE " dropsy", legs subject tp jerking/buckling      Mobility  Bed Mobility Overal bed mobility: Needs Assistance Bed Mobility: Sidelying to Sit;Rolling Rolling: Min guard Sidelying to sit: HOB elevated;Mod assist       General bed mobility comments: extra time and effort for patient to roll to side, use of rail, strill required mod assist with trunk and to scoot to bed edge.  Transfers Overall transfer level: Needs assistance Equipment used: Rolling walker (2 wheeled) Transfers: Sit to/from Stand Sit to Stand: Mod assist;From elevated surface         General transfer comment: steady assist to power up.  noted jerking of the legs at times and arms while holding RW  Ambulation/Gait Ambulation/Gait assistance: Mod assist Gait Distance (Feet): 15 Feet Assistive device: Rolling walker (2 wheeled) Gait Pattern/deviations: Step-to pattern;Step-through pattern;Trunk flexed Gait velocity: decr   General Gait Details: slow gait, did not use his shoes. steady assist for tuens.  Stairs            Wheelchair Mobility    Modified Rankin (Stroke Patients Only)       Balance Overall balance assessment: History of Falls;Needs assistance Sitting-balance support: Bilateral upper extremity supported;Feet supported Sitting balance-Leahy Scale: Fair     Standing balance support: During functional activity;Bilateral upper extremity supported Standing balance-Leahy Scale: Poor Standing balance comment: reliant on UE's                             Pertinent Vitals/Pain Pain Assessment: 0-10 Pain Score: 6  Pain Location: back 6, L thigh 4, head 3 Pain Descriptors / Indicators: Discomfort;Throbbing Pain Intervention(s): Premedicated before session;Monitored during session;Limited activity within patient's tolerance    Home Living Family/patient expects to be discharged to:: Private residence Living Arrangements: Spouse/significant other Available Help at Discharge: Family;Available 24 hours/day Type of Home: House Home Access: Stairs to enter Entrance Stairs-Rails: Left Entrance Stairs-Number of Steps: 13 Home Layout: Two level;Able to live on main level with bedroom/bathroom Home Equipment: Gilford Rile - 2 wheels;Cane - single point;Shower seat - built in      Prior Function Level of Independence: Needs assistance   Gait / Transfers Assistance Needed: recently using RW due to weakness  ADL's /  Homemaking Assistance Needed: wife assists        Hand Dominance   Dominant Hand: Right    Extremity/Trunk Assessment   Upper Extremity Assessment Upper Extremity Assessment:  Defer to OT evaluation (noted  intermittent jerking, noted to spill coffee x 3 while holding cup)    Lower Extremity Assessment Lower Extremity Assessment: LLE deficits/detail LLE Deficits / Details: 3+ knee extension, ? give way due to pain, L transmet amp LLE Sensation: history of peripheral neuropathy    Cervical / Trunk Assessment Cervical / Trunk Assessment: Kyphotic  Communication   Communication: No difficulties  Cognition Arousal/Alertness: Awake/alert Behavior During Therapy: WFL for tasks assessed/performed Overall Cognitive Status: Within Functional Limits for tasks assessed                                 General Comments: somewhat slow      General Comments      Exercises     Assessment/Plan    PT Assessment Patient needs continued PT services  PT Problem List Decreased strength;Decreased balance;Decreased knowledge of precautions;Decreased mobility;Decreased activity tolerance;Decreased safety awareness       PT Treatment Interventions DME instruction;Therapeutic activities;Gait training;Therapeutic exercise;Patient/family education;Functional mobility training    PT Goals (Current goals can be found in the Care Plan section)  Acute Rehab PT Goals Patient Stated Goal: to go home PT Goal Formulation: With patient/family Time For Goal Achievement: 08/02/19 Potential to Achieve Goals: Fair    Frequency Min 2X/week   Barriers to discharge Decreased caregiver support;Inaccessible home environment      Co-evaluation               AM-PAC PT "6 Clicks" Mobility  Outcome Measure Help needed turning from your back to your side while in a flat bed without using bedrails?: A Lot Help needed moving from lying on your back to sitting on the side of a flat bed without using bedrails?: A Lot Help needed moving to and from a bed to a chair (including a wheelchair)?: A Lot Help needed standing up from a chair using your arms (e.g., wheelchair or  bedside chair)?: A Lot Help needed to walk in hospital room?: A Lot Help needed climbing 3-5 steps with a railing? : Total 6 Click Score: 11    End of Session Equipment Utilized During Treatment: Gait belt Activity Tolerance: Patient limited by fatigue Patient left: with chair alarm set;in chair;with call bell/phone within reach Nurse Communication: Mobility status PT Visit Diagnosis: Unsteadiness on feet (R26.81);Pain;Difficulty in walking, not elsewhere classified (R26.2);History of falling (Z91.81) Pain - Right/Left: Left Pain - part of body: Leg    Time: 0910-0957 PT Time Calculation (min) (ACUTE ONLY): 47 min   Charges:   PT Evaluation $PT Eval Moderate Complexity: 1 Mod PT Treatments $Gait Training: 23-37 mins        Tresa Endo PT Acute Rehabilitation Services Pager [phone] Office [phone]   Claretha Cooper 07/19/2019, 1:54 PM

## 2019-07-19 NOTE — Plan of Care (Signed)
  Problem: Education: Goal: Knowledge of General Education information will improve Description: Including pain rating scale, medication(s)/side effects and non-pharmacologic comfort measures Outcome: Completed/Met   Problem: Health Behavior/Discharge Planning: Goal: Ability to manage health-related needs will improve Outcome: Progressing   Problem: Activity: Goal: Risk for activity intolerance will decrease Outcome: Progressing   Problem: Nutrition: Goal: Adequate nutrition will be maintained Outcome: Progressing   Problem: Coping: Goal: Level of anxiety will decrease Outcome: Progressing   Problem: Elimination: Goal: Will not experience complications related to bowel motility Outcome: Completed/Met Goal: Will not experience complications related to urinary retention Outcome: Completed/Met   Problem: Pain Managment: Goal: General experience of comfort will improve Outcome: Progressing   Problem: Safety: Goal: Ability to remain free from injury will improve Outcome: Progressing   Problem: Skin Integrity: Goal: Risk for impaired skin integrity will decrease Outcome: Progressing

## 2019-07-20 ENCOUNTER — Telehealth: Payer: Self-pay | Admitting: Nurse Practitioner

## 2019-07-20 ENCOUNTER — Encounter: Payer: Self-pay | Admitting: *Deleted

## 2019-07-20 DIAGNOSIS — Z66 Do not resuscitate: Secondary | ICD-10-CM

## 2019-07-20 DIAGNOSIS — E440 Moderate protein-calorie malnutrition: Secondary | ICD-10-CM | POA: Insufficient documentation

## 2019-07-20 DIAGNOSIS — Z515 Encounter for palliative care: Secondary | ICD-10-CM

## 2019-07-20 DIAGNOSIS — R531 Weakness: Secondary | ICD-10-CM

## 2019-07-20 LAB — BASIC METABOLIC PANEL
Anion gap: 11 (ref 5–15)
BUN: 37 mg/dL — ABNORMAL HIGH (ref 8–23)
CO2: 23 mmol/L (ref 22–32)
Calcium: 8.1 mg/dL — ABNORMAL LOW (ref 8.9–10.3)
Chloride: 97 mmol/L — ABNORMAL LOW (ref 98–111)
Creatinine, Ser: 0.93 mg/dL (ref 0.61–1.24)
GFR calc Af Amer: >60 mL/min (ref >60)
GFR calc non Af Amer: >60 mL/min (ref >60)
Glucose, Bld: 85 mg/dL (ref 70–99)
Potassium: 4 mmol/L (ref 3.5–5.1)
Sodium: 131 mmol/L — ABNORMAL LOW (ref 135–145)

## 2019-07-20 LAB — CBC
HCT: 45.8 % (ref 39.0–52.0)
Hemoglobin: 14 g/dL (ref 13.0–17.0)
MCH: 24.1 pg — ABNORMAL LOW (ref 26.0–34.0)
MCHC: 30.6 g/dL (ref 30.0–36.0)
MCV: 78.8 fL — ABNORMAL LOW (ref 80.0–100.0)
Platelets: 114 10*3/uL — ABNORMAL LOW (ref 150–400)
RBC: 5.81 MIL/uL (ref 4.22–5.81)
RDW: 21.8 % — ABNORMAL HIGH (ref 11.5–15.5)
WBC: 12.4 10*3/uL — ABNORMAL HIGH (ref 4.0–10.5)
nRBC: 0 % (ref 0.0–0.2)

## 2019-07-20 MED ORDER — GABAPENTIN 300 MG PO CAPS
300.0000 mg | ORAL_CAPSULE | Freq: Every day | ORAL | Status: DC
  Administered 2019-07-22 – 2019-07-24 (×3): 300 mg via ORAL
  Filled 2019-07-20 (×5): qty 1

## 2019-07-20 MED ORDER — GABAPENTIN 300 MG PO CAPS
300.0000 mg | ORAL_CAPSULE | Freq: Every day | ORAL | Status: DC | PRN
  Administered 2019-07-25: 300 mg via ORAL
  Filled 2019-07-20: qty 1

## 2019-07-20 NOTE — Evaluation (Signed)
Occupational Therapy Evaluation Patient Details Name: William Solis MRN: [id number] DOB: [birth year] Today's Date: 07/20/2019    History of Present Illness William Solis is a 69 y.o. male with a history of hepatic carcinoma with pulmonary metastasis, cirrhosis, hypertension, HCV, bipolar disorder, recently diagnosed PE Xarelto who was admitted 07/18/19 s/p fall and head injury resulting in SDH on CT scan.   Clinical Impression   Patient with functional deficits listed below impacting safety and independence with self care. Patient require min A for bed mobility with cues for sequencing log roll. Min/mod A with sit to stand from edge of bed, cues for safe hand placement and hand over hand assist to grip walker due to spastic/jerky extremities. Patient having difficulty drinking and feeding himself due to jerky UEs. Will continue to follow.    Follow Up Recommendations  SNF;Supervision/Assistance - 24 hour    Equipment Recommendations  None recommended by OT       Precautions / Restrictions Precautions Precautions: Fall Precaution Comments: jerking extremities  Restrictions Weight Bearing Restrictions: No      Mobility Bed Mobility Overal bed mobility: Needs Assistance Bed Mobility: Rolling;Sidelying to Sit Rolling: Min guard Sidelying to sit: Min assist;HOB elevated       General bed mobility comments: cues for sequencing/reaching for bed rail, min A to elevate trunk to sitting  Transfers Overall transfer level: Needs assistance Equipment used: Rolling walker (2 wheeled) Transfers: Sit to/from Stand Sit to Stand: Min assist;Mod assist         General transfer comment: difficulty holding onto walker due to jerky UEs, min/mod A to power up to standing and assist with eccentric control into chair    Balance Overall balance assessment: History of Falls;Needs assistance Sitting-balance support: Bilateral upper extremity supported;Feet supported Sitting balance-Leahy  Scale: Fair     Standing balance support: During functional activity;Bilateral upper extremity supported Standing balance-Leahy Scale: Poor Standing balance comment: reliant on UE's                           ADL either performed or assessed with clinical judgement   ADL Overall ADL's : Needs assistance/impaired Eating/Feeding: Minimal assistance Eating/Feeding Details (indicate cue type and reason): due to jerking UEs Grooming: Minimal assistance;Sitting   Upper Body Bathing: Minimal assistance;Sitting;Moderate assistance   Lower Body Bathing: Maximal assistance;Sitting/lateral leans   Upper Body Dressing : Minimal assistance;Moderate assistance;Sitting   Lower Body Dressing: Maximal assistance;Sitting/lateral leans;Sit to/from stand   Toilet Transfer: Minimal assistance;Moderate assistance;Cueing for safety;BSC;RW Toilet Transfer Details (indicate cue type and reason): to recliner, decreased eccentric control  Toileting- Clothing Manipulation and Hygiene: Moderate assistance;Sitting/lateral lean;Sit to/from stand       Functional mobility during ADLs: Minimal assistance;Moderate assistance;Rolling walker;Cueing for safety General ADL Comments: patient requiring increased assistance with self care due to decreased coordination, activity tolerance, balance, safety, increased pain                  Pertinent Vitals/Pain Pain Assessment: Faces Faces Pain Scale: Hurts little more Pain Location: back, head, chest with coughing Pain Descriptors / Indicators: Discomfort;Throbbing Pain Intervention(s): Monitored during session     Hand Dominance Right   Extremity/Trunk Assessment Upper Extremity Assessment Upper Extremity Assessment: Generalized weakness   Lower Extremity Assessment Lower Extremity Assessment: Defer to PT evaluation   Cervical / Trunk Assessment Cervical / Trunk Assessment: Kyphotic   Communication Communication Communication: No  difficulties   Cognition Arousal/Alertness: Awake/alert Behavior During Therapy: [facility]  for tasks assessed/performed Overall Cognitive Status: Within Functional Limits for tasks assessed                                 General Comments: patient require increase time to find words at times              Home Living Family/patient expects to be discharged to:: Private residence Living Arrangements: Spouse/significant other Available Help at Discharge: Family;Available 24 hours/day Type of Home: House Home Access: Stairs to enter [employer] of Steps: 13 Entrance Stairs-Rails: Left Home Layout: Two level;Able to live on main level with bedroom/bathroom     Bathroom Shower/Tub: Walk-in shower   Bathroom Toilet: Handicapped height     Home Equipment: Environmental consultant - 2 wheels;Cane - single point;Shower seat - built in;Bedside commode;Hand held shower head          Prior Functioning/Environment Level of Independence: Needs assistance  Gait / Transfers Assistance Needed: recently using RW due to weakness ADL's / Homemaking Assistance Needed: patient reports up until recent fall was I            OT Problem List: Decreased activity tolerance;Impaired balance (sitting and/or standing);Decreased coordination;Decreased safety awareness;Pain      OT Treatment/Interventions: Self-care/ADL training;Therapeutic exercise;Neuromuscular education;Energy conservation;DME and/or AE instruction;Therapeutic activities;Patient/family education;Balance training    OT Goals(Current goals can be found in the care plan section) Acute Rehab OT Goals Patient Stated Goal: to go home OT Goal Formulation: With patient Time For Goal Achievement: Aug 25, 2019 Potential to Achieve Goals: Good  OT Frequency: Min 2X/week    AM-PAC OT "6 Clicks" Daily Activity     Outcome Measure Help from another person eating meals?: None Help from another person taking care of personal grooming?: A  Little Help from another person toileting, which includes using toliet, bedpan, or urinal?: A Lot Help from another person bathing (including washing, rinsing, drying)?: A Lot Help from another person to put on and taking off regular upper body clothing?: A Lot Help from another person to put on and taking off regular lower body clothing?: A Lot 6 Click Score: 15   End of Session Equipment Utilized During Treatment: Surveyor, mining Communication: Mobility status  Activity Tolerance: Patient tolerated treatment well Patient left: in chair;with call bell/phone within reach;with chair alarm set  OT Visit Diagnosis: Unsteadiness on feet (R26.81);Other abnormalities of gait and mobility (R26.89);History of falling (Z91.81);Pain Pain - part of body:  (back)                Time: 4854-6270 OT Time Calculation (min): 23 min Charges:  OT General Charges $OT Visit: 1 Visit OT Evaluation $OT Eval Moderate Complexity: 1 Mod OT Treatments $Self Care/Home Management : 8-22 mins  Delbert Phenix OT Pager: [phone]  Rosemary Holms 07/20/2019, 12:42 PM

## 2019-07-20 NOTE — Progress Notes (Signed)
Patient ID: William Solis, male   DOB: 1950-04-05, 69 y.o.   MRN: [id number]  This NP visited patient at the bedside as a follow up to  yesterday's [place] for palliative medicine needs and emotional support.  Met as scheduled at bedside with wife/Karen for continued converaton regarding current medcial situation; diagnosis, prognosis, goals of care, end-of-life wishes, disposition and options.   Dr Bonner Puna to room to update patient and explore decisions regarding treatment options specific to IVC filter.  I offered education regarding the difference between an aggressive medical intervention path and a palliative comfort path.     Emotional support offered  I shared my concerns addressed regarding patient functional status and energy level as it relates to successful rehabilitation.   For today patient and his wife will continue to process the information regarding IVC filter before making a decision.  Plan for now is when medically stable discharge to skilled nursing facility for ongoing rehab and continue with oral chemotherapy.  Discussed with patient/family  the importance of continued conversation with each other  and their  medical providers regarding overall plan of care and treatment options,  ensuring decisions are within the context of the patients values and GOCs.  Questions and concerns addressed   Discussed with Dr Bonner Puna  Palliative medicine will continue to support holistically  Total time spent on the unit was 35 minutes  Greater than 50% of the time was spent in counseling and coordination of care  Wadie Lessen NP  Palliative Medicine Team Team Phone # [phone] Pager [phone]

## 2019-07-20 NOTE — Progress Notes (Signed)
Patient leaving hospital and wishes to reschedule 7/2 visit to week of 07/25/19. Per Dr. Benay Spice: Lab/OV on 7/8 w/NP. Scheduling message sent.

## 2019-07-20 NOTE — TOC Progression Note (Signed)
Transition of Care [facility]) - Progression Note    Patient Details  Name: ARRICK DUTTON MRN: [id number] Date of Birth: 10-11-1950  Transition of Care [facility]) CM/SW Contact  Purcell Mouton, RN Phone Number: 07/20/2019, 11:50 AM  Clinical Narrative:     FL2 faxed and PASRR was completed. Plan to discharge to a SNF.   Expected Discharge Plan: [place] Barriers to Discharge: No Barriers Identified  Expected Discharge Plan and Services Expected Discharge Plan: [place]   Discharge Planning Services: CM Consult   Living arrangements for the past 2 months: Single Family Home                                       Social Determinants of Health (SDOH) Interventions    Readmission Risk Interventions No flowsheet data found.

## 2019-07-20 NOTE — Progress Notes (Signed)
PROGRESS NOTE  William Solis  VOH:[id number] DOB: 07/22/1950 DOA: 07/18/2019 PCP: Leamon Arnt, MD  Outpatient Specialists: Oncology, Dr. Benay Spice Brief Narrative: William Solis is a 69 y.o. male with a history of hepatic carcinoma with pulmonary metastases on lenvatinib, hepatic cirrhosis, HCV, HTN, bipolar disorder, and recently diagnosed PE on xarelto who presented 6/28 after a fall found to have SDH. William Solis was given and he had no focal neurological deficits reported on exam. Neurosurgery recommended observation and serial CT which showed a slight decrease in the small SDH. Due to recent weakness and now fall, PT was consulted and recommends SNF placement which is being pursued. Palliative care was consulted as well as the patient's oncologist. Anticoagulation has been held. He has developed some jerking movements and gabapentin dosing is being adjusted.   Assessment & Plan: Active Problems:   Essential hypertension   MDD (major depressive disorder)   Hepatocellular carcinoma (HCC)   SDH (subdural hematoma) (HCC)   Failure to thrive in adult   Palliative care by specialist   DNR (do not resuscitate)   Malnutrition of moderate degree  SDH: In setting of anticoagulation. s/p Kcentra with improvement on serial CT head.  - Hold xarelto.  - Monitor neurological status  Weakness, failure to thrive:  - PT/OT recommend SNF which the patient agrees to, and is being pursued.   Periodic jerking movements: Odd presentation as there is minimal tremor component, and only minimal asterixis on exam. These movements are usually spontaneous jerks or loss of muscle contraction affecting all extremities. This may have been the cause of the fall but has been noticed more since admission. He usually only takes gabapentin at bedtime, occasionally an additional dose during the day, though he's been getting it TID here per his MAR.  - May be metabolic/related to gabapentin and hepatic dysfunction.  No sustained component or LOC to suggest seizure.  - Hold gabapentin for today, restart prn and qHS 7/1.  - PT/OT, fall precautions, assistance with feeding/drinking.   Bilateral segmental PE Dx 4/12, still present 6/8: No documented DVT, so IVC filter unlikely to be of benefit. - Holding anticoagulation after prolonged discussion of risks and benefits. Also discussed with Dr. Benay Spice who will follow up with the patient shortly after discharge. He will discuss reinitiation of anticoagulation at that time.   Metastatic hepatic carcinoma:  - Continue lenvatinib per oncology.  - Continue palliative care following at SNF.  Wheezing: Stable/improved.  - Prn albuterol.   Hyponatremia: Improved.  - Monitor in AM. DC IVF as he's taking adequate po.   HTN:  - Continue norvasc, coreg - Holding HCTZ, lisinopril due to hyponatremia.  - prn hydralazine  DVT prophylaxis: SCDs Code Status: DNR Family Communication: Wife at bedside on PM rounds Disposition Plan:  Status is: Inpatient  Remains inpatient appropriate because:Unsafe d/c plan   Dispo: The patient is from: Home              Anticipated d/c is to: SNF              Anticipated d/c date is: 1 day              Patient currently is not medically stable to d/c.  Consultants:   Oncology, Dr. Benay Spice  Palliative care  Neurosurgery  Procedures:   None  Antimicrobials:  None   Subjective: Feels diffusely weak but no focal weakness or numbness. Jerking movements becoming more prominent and interrupting all aspects of ADLs. No dyspnea  or chest pain currently. No bleeding.   Objective: Vitals:   07/19/19 2044 07/19/19 2147 07/20/19 0356 07/20/19 1522  BP:   138/80 133/72  Pulse: 72  90 82  Resp:   20 (!) 22  Temp:   99.4 F (37.4 C) 97.9 F (36.6 C)  TempSrc:   Oral Oral  SpO2: 91% 95% 92% 91%  Weight:      Height:        Intake/Output Summary (Last 24 hours) at 07/20/2019 1630 Last data filed at 07/20/2019  1100 Gross per 24 hour  Intake 3576.96 ml  Output 1250 ml  Net 2326.96 ml   Filed Weights   07/18/19 0411 07/18/19 0958  Weight: 82.1 kg 86.2 kg    Gen: 69 y.o. chronically ill-appearing male in no distress, pleasant Pulm: Non-labored breathing 2L O2. Clear to auscultation bilaterally.  CV: Regular rate and rhythm. No murmur, rub, or gallop. No JVD, + pedal edema. GI: Abdomen soft, hepatomegaly, non-tender, slightly distended but not taut, with normoactive bowel sounds. Ext: Warm, no deformities Skin: No rashes, lesions or ulcers Neuro: Alert and oriented. No focal neurological deficits in 4 extremities, mild asterixis. Has normal strength throughout with unpredictable giving out occasionally. Psych: Judgement and insight appear fair. Mood depressed & affect appropriate.   Data Reviewed: I have personally reviewed following labs and imaging studies  CBC: Recent Labs  Lab 07/18/19 0217 07/19/19 0432 07/20/19 0423  WBC 14.6* 13.6* 12.4*  HGB 15.8 14.0 14.0  HCT 50.8 46.1 45.8  MCV 77.7* 79.6* 78.8*  PLT 96* 112* 672*   Basic Metabolic Panel: Recent Labs  Lab 07/18/19 0217 07/19/19 0432 07/20/19 0423  NA 130* 128* 131*  K 5.0 4.1 4.0  CL 95* 95* 97*  CO2 20* 23 23  GLUCOSE 90 73 85  BUN 20 25* 37*  CREATININE 0.70 0.94 0.93  CALCIUM 8.4* 7.8* 8.1*  MG  --  2.0  --    GFR: Estimated Creatinine Clearance: 85.8 mL/min (by C-G formula based on SCr of 0.93 mg/dL). Liver Function Tests: No results for input(s): AST, ALT, ALKPHOS, BILITOT, PROT, ALBUMIN in the last 168 hours. No results for input(s): LIPASE, AMYLASE in the last 168 hours. No results for input(s): AMMONIA in the last 168 hours. Coagulation Profile: No results for input(s): INR, PROTIME in the last 168 hours. Cardiac Enzymes: No results for input(s): CKTOTAL, CKMB, CKMBINDEX, TROPONINI in the last 168 hours. BNP (last 3 results) No results for input(s): PROBNP in the last 8760 hours. HbA1C: No  results for input(s): HGBA1C in the last 72 hours. CBG: Recent Labs  Lab 07/18/19 0212  GLUCAP 100*   Lipid Profile: No results for input(s): CHOL, HDL, LDLCALC, TRIG, CHOLHDL, LDLDIRECT in the last 72 hours. Thyroid Function Tests: No results for input(s): TSH, T4TOTAL, FREET4, T3FREE, THYROIDAB in the last 72 hours. Anemia Panel: No results for input(s): VITAMINB12, FOLATE, FERRITIN, TIBC, IRON, RETICCTPCT in the last 72 hours. Urine analysis:    Component Value Date/Time   COLORURINE AMBER (A) 07/18/2019 0859   APPEARANCEUR CLEAR 07/18/2019 0859   LABSPEC 1.013 07/18/2019 0859   PHURINE 5.0 07/18/2019 0859   GLUCOSEU NEGATIVE 07/18/2019 0859   HGBUR SMALL (A) 07/18/2019 0859   BILIRUBINUR NEGATIVE 07/18/2019 0859   BILIRUBINUR Negative 06/04/2018 [place] 07/18/2019 0859   PROTEINUR NEGATIVE 07/18/2019 0859   UROBILINOGEN 0.2 06/04/2018 1435   NITRITE NEGATIVE 07/18/2019 0859   LEUKOCYTESUR NEGATIVE 07/18/2019 0859   Recent Results (from  the past 240 hour(s))  SARS Coronavirus 2 by RT PCR (hospital order, performed in [facility] hospital lab) Nasopharyngeal Nasopharyngeal Swab     Status: None   Collection Time: 07/18/19  4:14 AM   Specimen: Nasopharyngeal Swab  Result Value Ref Range Status   SARS Coronavirus 2 NEGATIVE NEGATIVE Final    Comment: (NOTE) SARS-CoV-2 target nucleic acids are NOT DETECTED.  The SARS-CoV-2 RNA is generally detectable in upper and lower respiratory specimens during the acute phase of infection. The lowest concentration of SARS-CoV-2 viral copies this assay can detect is 250 copies / mL. A negative result does not preclude SARS-CoV-2 infection and should not be used as the sole basis for treatment or other patient management decisions.  A negative result may occur with improper specimen collection / handling, submission of specimen other than nasopharyngeal swab, presence of viral mutation(s) within the areas targeted by  this assay, and inadequate number of viral copies (<250 copies / mL). A negative result must be combined with clinical observations, patient history, and epidemiological information.  Fact Sheet for Patients:   StrictlyIdeas.no  Fact Sheet for Healthcare Providers: BankingDealers.co.za  This test is not yet approved or  cleared by the [place] FDA and has been authorized for detection and/or diagnosis of SARS-CoV-2 by FDA under an Emergency Use Authorization (EUA).  This EUA will remain in effect (meaning this test can be used) for the duration of the COVID-19 declaration under Section 564(b)(1) of the Act, 21 U.S.C. section 360bbb-3(b)(1), unless the authorization is terminated or revoked sooner.  Performed at [facility], [place] [address], [place], [place] [address]       Radiology Studies: No results found.  Scheduled Meds: . amLODipine  10 mg Oral Daily  . carvedilol  12.5 mg Oral BID  . feeding supplement (ENSURE ENLIVE)  237 mL Oral BID BM  . [START ON 07/21/2019] gabapentin  300 mg Oral QHS  . Lenvatinib 12 mg daily dose  12 mg Oral Daily  . mouth rinse  15 mL Mouth Rinse BID  . melatonin  10 mg Oral QHS  . multivitamin with minerals  1 tablet Oral Daily  . sodium chloride flush  3 mL Intravenous Q12H  . tetrahydrozoline  1 drop Both Eyes TID   Continuous Infusions: . sodium chloride       LOS: 1 day   Time spent: 25 minutes.  Patrecia Pour, MD [facility] [email or web address] 07/20/2019, 4:30 PM

## 2019-07-20 NOTE — Telephone Encounter (Signed)
Scheduled apt per 6/30 sch msg - pt wife if aware of appts.

## 2019-07-20 NOTE — NC FL2 (Signed)
[place] LEVEL OF CARE SCREENING TOOL     IDENTIFICATION  Patient Name: William Solis Birthdate: 11/06/1950 Sex: male Admission Date (Current Location): 07/18/2019  [facility] and [place] Number:  Herbalist and Address:  [facility],  [place], [place]      Provider Number: [id number]  Attending Physician Name and Address:  Patrecia Pour, MD  Relative Name and Phone Number:  William Solis wife [phone]    Current Level of Care: Hospital Recommended Level of Care: [place] Prior Approval Number:    Date Approved/Denied:   PASRR Number: [id number]  Discharge Plan: SNF    Current Diagnoses: Patient Active Problem List   Diagnosis Date Noted  . Malnutrition of moderate degree 07/20/2019  . Palliative care by specialist   . DNR (do not resuscitate)   . Failure to thrive in adult 07/19/2019  . SDH (subdural hematoma) ([place]) 07/18/2019  . Hepatocellular carcinoma ([place]) 04/25/2019  . Goals of care, counseling/discussion 04/25/2019  . Tremor 09/30/2018  . S/P transmetatarsal amputation of foot, left ([place]) 04/10/2018  . Osteomyelitis of left foot ([place]) 04/03/2018  . Postlaminectomy syndrome 04/01/2017  . Lumbar stenosis 03/09/2017  . Fibromyalgia 09/15/2016  . Risk for falls 06/25/2016  . OSA on CPAP 11/07/2015  . Bilateral edema of lower extremity 10/12/2015  . S/P removal of parathyroid gland ([place]) 12/21/2013  . History of hyperparathyroidism 11/22/2013  . History of osteomyelitis 10/25/2013  . Chronic low back pain 05/12/2012  . Narcotic dependence ([place]) 05/12/2012  . History of hepatitis C 04/19/2010  . Essential hypertension 04/19/2010  . Dry eye syndrome 11/20/2008  . MDD (major depressive disorder) 11/20/2008  . ADD (attention deficit disorder) 12/01/2006    Orientation RESPIRATION BLADDER Height & Weight     Self, Time, Situation, Place  O2 (4L O2  [place]) Continent, External catheter  Weight: 86.2 kg Height:  6' (182.9 cm)  BEHAVIORAL SYMPTOMS/MOOD NEUROLOGICAL BOWEL NUTRITION STATUS      Continent Diet (Regular diet)  AMBULATORY STATUS COMMUNICATION OF NEEDS Skin   Extensive Assist Verbally Bruising, Other (Comment) (Back and arms bruising. Amputation of Half of left foot.)                       Personal Care Assistance Level of Assistance  Bathing, Feeding, Dressing Bathing Assistance: Limited assistance Feeding assistance: Limited assistance Dressing Assistance: Limited assistance     Functional Limitations Info  Sight, Hearing, Speech Sight Info: Adequate Hearing Info: Adequate Speech Info: Adequate    SPECIAL CARE FACTORS FREQUENCY  PT (By licensed PT), OT (By licensed OT)     PT Frequency: 5x week OT Frequency: 5x week            Contractures Contractures Info: Not present    Additional Factors Info  Code Status, Allergies Code Status Info: DNR Allergies Info: No Known Allergies           Current Medications (07/20/2019):  This is the current hospital active medication list Current Facility-Administered Medications  Medication Dose Route Frequency Provider Last Rate Last Admin  . 0.9 %  sodium chloride infusion  250 mL Intravenous PRN Norins, Heinz Knuckles, MD      . acetaminophen (TYLENOL) tablet 500 mg  500 mg Oral Q6H PRN Neena Rhymes, MD   500 mg at 07/19/19 1543  . albuterol (PROVENTIL) (2.5 MG/3ML) 0.083% nebulizer solution 2.5 mg  2.5 mg Nebulization Q6H PRN Neysa Bonito,  Chauncy Passy, MD      . amLODipine (NORVASC) tablet 10 mg  10 mg Oral Daily Norins, Heinz Knuckles, MD   10 mg at 07/20/19 0949  . benzonatate (TESSALON) capsule 100 mg  100 mg Oral BID PRN Harold Hedge, MD   100 mg at 07/18/19 1725  . carvedilol (COREG) tablet 12.5 mg  12.5 mg Oral BID Neena Rhymes, MD   12.5 mg at 07/20/19 0950  . cyclobenzaprine (FLEXERIL) tablet 10 mg  10 mg Oral TID PRN Neena Rhymes, MD   10 mg at 07/18/19 0948  . feeding supplement (ENSURE  ENLIVE) (ENSURE ENLIVE) liquid 237 mL  237 mL Oral BID BM Harold Hedge, MD   237 mL at 07/20/19 0952  . [START ON 07/21/2019] gabapentin (NEURONTIN) capsule 300 mg  300 mg Oral QHS Vance Gather B, MD      . gabapentin (NEURONTIN) capsule 300 mg  300 mg Oral Daily PRN Patrecia Pour, MD      . guaiFENesin-dextromethorphan (ROBITUSSIN DM) 100-10 MG/5ML syrup 5 mL  5 mL Oral Q4H PRN Harold Hedge, MD   5 mL at 07/20/19 0950  . hydrALAZINE (APRESOLINE) injection 10 mg  10 mg Intravenous Q6H PRN Harold Hedge, MD      . Lenvatinib 12 mg daily dose (LENVIMA) capsule 12 mg  12 mg Oral Daily Harold Hedge, MD   12 mg at 07/19/19 1806  . MEDLINE mouth rinse  15 mL Mouth Rinse BID Harold Hedge, MD   15 mL at 07/18/19 2219  . melatonin tablet 10 mg  10 mg Oral QHS Norins, Heinz Knuckles, MD   10 mg at 07/18/19 2147  . multivitamin with minerals tablet 1 tablet  1 tablet Oral Daily Harold Hedge, MD   1 tablet at 07/20/19 0950  . oxyCODONE (Oxy IR/ROXICODONE) immediate release tablet 10 mg  10 mg Oral Q4H PRN Norins, Heinz Knuckles, MD   10 mg at 07/20/19 0950  . prochlorperazine (COMPAZINE) tablet 10 mg  10 mg Oral Q6H PRN Norins, Heinz Knuckles, MD      . sodium chloride (OCEAN) 0.65 % nasal spray 1 spray  1 spray Each Nare PRN Norins, Heinz Knuckles, MD      . sodium chloride flush (NS) 0.9 % injection 3 mL  3 mL Intravenous Q12H Norins, Heinz Knuckles, MD   3 mL at 07/19/19 0858  . sodium chloride flush (NS) 0.9 % injection 3 mL  3 mL Intravenous PRN Norins, Heinz Knuckles, MD      . tetrahydrozoline 0.05 % ophthalmic solution 1 drop  1 drop Both Eyes TID Norins, Heinz Knuckles, MD         Discharge Medications: Please see discharge summary for a list of discharge medications.  Relevant Imaging Results:  Relevant Lab Results:   Additional Information [phone]  Purcell Mouton, RN

## 2019-07-21 DIAGNOSIS — R531 Weakness: Secondary | ICD-10-CM

## 2019-07-21 LAB — BASIC METABOLIC PANEL
Anion gap: 15 (ref 5–15)
BUN: 39 mg/dL — ABNORMAL HIGH (ref 8–23)
CO2: 19 mmol/L — ABNORMAL LOW (ref 22–32)
Calcium: 8.1 mg/dL — ABNORMAL LOW (ref 8.9–10.3)
Chloride: 97 mmol/L — ABNORMAL LOW (ref 98–111)
Creatinine, Ser: 0.78 mg/dL (ref 0.61–1.24)
GFR calc Af Amer: >60 mL/min (ref >60)
GFR calc non Af Amer: >60 mL/min (ref >60)
Glucose, Bld: 92 mg/dL (ref 70–99)
Potassium: 4.7 mmol/L (ref 3.5–5.1)
Sodium: 131 mmol/L — ABNORMAL LOW (ref 135–145)

## 2019-07-21 LAB — CBC
HCT: 45.3 % (ref 39.0–52.0)
Hemoglobin: 13.8 g/dL (ref 13.0–17.0)
MCH: 24.2 pg — ABNORMAL LOW (ref 26.0–34.0)
MCHC: 30.5 g/dL (ref 30.0–36.0)
MCV: 79.5 fL — ABNORMAL LOW (ref 80.0–100.0)
Platelets: 97 10*3/uL — ABNORMAL LOW (ref 150–400)
RBC: 5.7 MIL/uL (ref 4.22–5.81)
RDW: 22 % — ABNORMAL HIGH (ref 11.5–15.5)
WBC: 12.7 10*3/uL — ABNORMAL HIGH (ref 4.0–10.5)
nRBC: 0 % (ref 0.0–0.2)

## 2019-07-21 LAB — PHOSPHORUS: Phosphorus: 3.1 mg/dL (ref 2.5–4.6)

## 2019-07-21 LAB — MAGNESIUM: Magnesium: 2.3 mg/dL (ref 1.7–2.4)

## 2019-07-21 NOTE — Progress Notes (Signed)
PROGRESS NOTE  William Solis  DOB: July 31, 1950  PCP: Leamon Arnt, MD MBT:[id number]  DOA: 07/18/2019  LOS: 2 days   Chief Complaint  Patient presents with  . Fall  . Head Injury  . Weakness   Brief narrative: William Solis is a 69 y.o. male with a history of hepatic carcinoma with pulmonary metastases on lenvatinib, hepatic cirrhosis, HCV, HTN, bipolar disorder, and recently diagnosed PE on xarelto. Patient presented to ED on 6/28 after a fall.  On imaging, he was found to have SDH.  No neurological deficit on exam. Kcentra was given to reverse anticoagulation due to Xarelto.   Neurosurgery recommended observation and serial CT which showed a slight decrease in the small SDH.  Due to recent weakness and now fall, PT was consulted and rec amended SNF placement which is being pursued.  Palliative care and oncology consultation were obtained.  Anticoagulation is being held.   Subjective: Patient was seen and examined this morning.  Pleasant elderly Caucasian male.  Lying down in bed.  Not in distress.  No new symptoms.  Assessment/Plan: Subdural hematoma after a fall  - in setting of anticoagulation.  - s/p Kcentra with improvement on serial CT head.  - xarelto remains on hold.  - Monitor neurological status  Weakness, failure to thrive:  - PT/OT recommend SNF which the patient agrees to, and is being pursued.   Periodic jerking movements:  -Odd presentation as there is minimal tremor component, and only minimal asterixis on exam. These movements are usually spontaneous jerks or loss of muscle contraction affecting all extremities. This may have been the cause of the fall but has been noticed more since admission. He usually only takes gabapentin at bedtime, occasionally an additional dose during the day.  However, for unknown reason, patient was getting it more often in the hospital.  -Jerking movements are suspected to be secondary to high total dose of gabapentin.   Though he's been getting it TID here per his MAR.  - tell when it has been switched to as needed at bedtime. - PT/OT, fall precautions, assistance with feeding/drinking.   Bilateral segmental PE  -Initially diagnosed on 4/12, still present 6/8. No documented DVT, so IVC filter unlikely to be of benefit. -Prior to admission, patient was on Xarelto.  Per previous documentations, discussion was held between patient and his oncologist as well.  Xarelto is currently on hold.  Post discharge, patient will follow up with his oncologist for discussion about risk and benefit of reinitiation of anticoagulation. -On low-flow oxygen by nasal cannula.  Metastatic hepatic carcinoma:  -Continue lenvatinib per oncology.  -Continue palliative care following at SNF.  Hyponatremia -Sodium remains low, 131 today.  HTN -Blood pressure stable on Coreg and Norvasc.   - Holding HCTZ, lisinopril due to hyponatremia.  - prn hydralazine  Mobility: PT eval obtained.  SNF recommended.  Nutritional status: Body mass index is 25.77 kg/m. Nutrition Problem: Moderate Malnutrition Etiology: chronic illness (cancer) Signs/Symptoms: energy intake < 75% for > or equal to 1 month, mild muscle depletion, mild fat depletion, percent weight loss, moderate fat depletion Percent weight loss: 9 % Diet Order            Diet regular Room service appropriate? Yes; Fluid consistency: Thin  Diet effective now                 Code Status:   Code Status: DNR  DVT prophylaxis: Place TED hose Start: 07/18/19 0515  Antimicrobials:  None currently Fluid: None  Consultants: Palliative care, oncology Family Communication:  None at bedside Status is: Inpatient  Dispo: The patient is from: Home              Anticipated d/c is to: SNF              Anticipated d/c date is: Medically stable for discharge.  Pending SNF              Patient currently is medically stable to d/c.       Infusions:  . sodium chloride       Scheduled Meds: . amLODipine  10 mg Oral Daily  . carvedilol  12.5 mg Oral BID  . feeding supplement (ENSURE ENLIVE)  237 mL Oral BID BM  . gabapentin  300 mg Oral QHS  . Lenvatinib 12 mg daily dose  12 mg Oral Daily  . mouth rinse  15 mL Mouth Rinse BID  . melatonin  10 mg Oral QHS  . multivitamin with minerals  1 tablet Oral Daily  . sodium chloride flush  3 mL Intravenous Q12H  . tetrahydrozoline  1 drop Both Eyes TID    Antimicrobials: Anti-infectives (From admission, onward)   None      PRN meds: sodium chloride, acetaminophen, albuterol, benzonatate, cyclobenzaprine, gabapentin, guaiFENesin-dextromethorphan, hydrALAZINE, oxyCODONE, prochlorperazine, sodium chloride, sodium chloride flush   Objective: Vitals:   07/21/19 0550 07/21/19 1233  BP: 137/79 117/82  Pulse: 78 76  Resp: 16 20  Temp: 98.2 F (36.8 C) 98.1 F (36.7 C)  SpO2: 92% 93%    Intake/Output Summary (Last 24 hours) at 07/21/2019 1303 Last data filed at 07/21/2019 1030 Gross per 24 hour  Intake 323 ml  Output 1050 ml  Net -727 ml   Filed Weights   07/18/19 0411 07/18/19 0958  Weight: 82.1 kg 86.2 kg   Weight change:  Body mass index is 25.77 kg/m.   Physical Exam: General exam: Appears calm and comfortable.  Skin: No rashes, lesions or ulcers. HEENT: Atraumatic, normocephalic, supple neck, no obvious bleeding Lungs: Clear to auscultation bilaterally CVS: Regular rate and rhythm, no murmur GI/Abd soft, nontender, nondistended, bowel present CNS: Alert, awake, appropriately answers Psychiatry: Depressed look Extremities: No pedal edema, no calf tenderness  Data Review: I have personally reviewed the laboratory data and studies available.  Recent Labs  Lab 07/18/19 0217 07/19/19 0432 07/20/19 0423 07/21/19 0431  WBC 14.6* 13.6* 12.4* 12.7*  HGB 15.8 14.0 14.0 13.8  HCT 50.8 46.1 45.8 45.3  MCV 77.7* 79.6* 78.8* 79.5*  PLT 96* 112* 114* 97*   Recent Labs  Lab 07/18/19 0217  07/19/19 0432 07/20/19 0423 07/21/19 0431  NA 130* 128* 131* 131*  K 5.0 4.1 4.0 4.7  CL 95* 95* 97* 97*  CO2 20* 23 23 19*  GLUCOSE 90 73 85 92  BUN 20 25* 37* 39*  CREATININE 0.70 0.94 0.93 0.78  CALCIUM 8.4* 7.8* 8.1* 8.1*  MG  --  2.0  --  2.3  PHOS  --   --   --  3.1    Signed, Terrilee Croak, MD [facility] Pager: [phone] (Secure Chat preferred). 07/21/2019

## 2019-07-21 NOTE — TOC Progression Note (Signed)
Transition of Care [facility]) - Progression Note    Patient Details  Name: William Solis MRN: [id number] Date of Birth: 1950/02/25  [facility] CM/SW Contact  Purcell Mouton, RN Phone Number: 07/21/2019, 1:45 PM  Clinical Narrative:    Spoke with pt concerning discharge plan. Pt states, "I want to go home, but my wife want me to go to Nursing Home. She is in charge."  A call to pt's wife Mrs. Kubicki who selected [facility].  Pt will need COVID test before going to SNF.    Expected Discharge Plan: [place] Barriers to Discharge: No Barriers Identified  Expected Discharge Plan and Services Expected Discharge Plan: [place]   Discharge Planning Services: CM Consult   Living arrangements for the past 2 months: Single Family Home                                       Social Determinants of Health (SDOH) Interventions    Readmission Risk Interventions No flowsheet data found.

## 2019-07-21 NOTE — Progress Notes (Signed)
PT Cancellation Note  Patient Details Name: William Solis MRN: [id number] DOB: Apr 25, 1950   Cancelled Treatment:    Reason Eval/Treat Not Completed: Other (comment)patient eating lunch at 2:30. RN reports patient was assisted to [facility] and was 2 assist with much difficulty, jerking continues. Will check back another time.   Claretha Cooper 07/21/2019, 3:45 PM [place] Pager [phone] Office [phone]

## 2019-07-21 NOTE — Progress Notes (Signed)
Manufacturing engineer [facility])  Discussed pt going home with hospice with PMT.    Attempted to reach out to family, but no answer and unable to leave msg.  Pt would like to continue his oral chemo, which is not normally under the hospice plan of care.  This will require approval from our medical director.  [place] liaison will attempt again to get in touch with family and assist with d/c planning however we can.  Venia Carbon RN, BSN, [facility] (please check AMION for daily coverage)

## 2019-07-21 NOTE — Progress Notes (Signed)
Patient ID: William Solis, male   DOB: 07/22/1950, 68 y.o.   MRN: [id number]  This NP visited patient at the bedside as a follow up to for palliative medicine needs and emotional support.  Met again at bedside with wife/William Solis for continued converaton regarding current medcial situation.  [name] is extremely weak, he is frustrated and tired "of all this". His functional status is poor. His abdomen is distended and tender to touch.  He is verbalizing that he does not want to transition to SNF for rehabilitation at this time.       He wants to go home and do the best that he can. He wants to continue to take his LENVIMA, he has his own supply. He does not want to be hospitalized.  He lives at home with his wife, there is no other support.  I believe that this patient's prognosis is limited, likely less than 6 months.  Patient and his wife are both aware of the seriousness of his illness,  understandably  patient continues to hope for improvement in continuing the LENIVA.  Patient and his wife are hopeful for  hospice services in the home at this difficult time. Patient verbalizes if I get worse then "just take me to the hospice".  Education offered regarding the natural trajectory and expectations at EOL.   I spoke to hospice liaison/Jennifer and patient will be reviewed for hospice services.  Hopefully he can dc home tomorrow with hospice services and take this one day at a time.  Emotional support offered  Discussed with patient/family  the importance of continued conversation with each other  and their  medical providers regarding overall plan of care and treatment options,  ensuring decisions are within the context of the patients values and GOCs.  Palliative medicine will continue to support holistically  Total time spent on the unit was 35 minutes  Greater than 50% of the time was spent in counseling and coordination of care  Mary Larach NP  Palliative Medicine Team Team Phone  # [phone] Pager [phone]   

## 2019-07-22 ENCOUNTER — Telehealth: Payer: Self-pay | Admitting: *Deleted

## 2019-07-22 ENCOUNTER — Inpatient Hospital Stay: Payer: Medicare HMO

## 2019-07-22 ENCOUNTER — Inpatient Hospital Stay: Payer: Medicare HMO | Admitting: Oncology

## 2019-07-22 NOTE — Telephone Encounter (Addendum)
Husband told her that Dr. Benay Spice gave OK for him to go home today. She has serious concerns about this and wants to talk with Dr. Benay Spice.  Message forwarded to MD desk. Per Dr. Benay Spice: D/C today was not discussed with patient. Feels he would do better in rehab situation prior to going home. Called wife back and left this message on her voice mail.

## 2019-07-22 NOTE — Care Management Important Message (Signed)
Important Message  Patient Details IM Letter given to Gabriel Earing RN Case Manager to present to the Patient Name: William Solis MRN: [id number] Date of Birth: 1950-06-25   Medicare Important Message Given:  Yes     Kerin Salen 07/22/2019, 11:23 AM

## 2019-07-22 NOTE — Progress Notes (Signed)
PROGRESS NOTE  William Solis  DOB: May 25, 1950  PCP: Leamon Arnt, MD VOH:[id number]  DOA: 07/18/2019  LOS: 3 days   Chief Complaint  Patient presents with  . Fall  . Head Injury  . Weakness   Brief narrative: William Solis is a 69 y.o. male with a history of hepatic carcinoma with pulmonary metastases on lenvatinib, hepatic cirrhosis, HCV, HTN, bipolar disorder, and recently diagnosed PE on xarelto. Patient presented to ED on 6/28 after a fall.  On imaging, he was found to have SDH.  No neurological deficit on exam. Kcentra was given to reverse anticoagulation due to Xarelto.   Neurosurgery recommended observation and serial CT which showed a slight decrease in the small SDH.  Due to recent weakness and now fall, PT was consulted and rec amended SNF placement which is being pursued.  Palliative care and oncology consultation were obtained.  Anticoagulation is being held.   Subjective: Patient was seen and examined this morning.  Pleasant elderly Caucasian male.  Lying down in bed.  Seems uncomfortable.  Cannot point to exact complaint. Chart reviewed.  Palliative care consultation obtained yesterday.  Tentative plan was to discharge patient to home with hospice services. Per patient's wife, she is unable to take care of him at home.   Plan for discharge to SNF with palliative services.  Assessment/Plan: Subdural hematoma after a fall  - in setting of anticoagulation.  - s/p Kcentra with improvement on serial CT head.  - xarelto remains on hold.  - Monitor neurological status  Weakness, failure to thrive:  - PT/OT recommend SNF which the patient agrees to, and is being pursued.   Periodic jerking movements:  -Odd presentation as there is minimal tremor component, and only minimal asterixis on exam. These movements are usually spontaneous jerks or loss of muscle contraction affecting all extremities. This may have been the cause of the fall but has been noticed more  since admission. He usually only takes gabapentin at bedtime, occasionally an additional dose during the day.  However, for unknown reason, patient was getting it more often in the hospital.  -Jerking movements are suspected to be secondary to high total dose of gabapentin.  Though he's been getting it TID here per his MAR.  - tell when it has been switched to as needed at bedtime. - PT/OT, fall precautions, assistance with feeding/drinking.   Bilateral segmental PE  -Initially diagnosed on 4/12, still present 6/8. No documented DVT, so IVC filter unlikely to be of benefit. -Prior to admission, patient was on Xarelto.  Per previous documentations, discussion was held between patient and his oncologist as well.  Xarelto is currently on hold.  Post discharge, patient will follow up with his oncologist for discussion about risk and benefit of reinitiation of anticoagulation. -On low-flow oxygen by nasal cannula.  Metastatic hepatic carcinoma:  -Continue lenvatinib per oncology.  -Continue palliative care following at SNF.  Hyponatremia -Sodium remains low, 131 today.  HTN -Blood pressure stable on Coreg and Norvasc.   - Holding HCTZ, lisinopril due to hyponatremia.  - prn hydralazine  Mobility: PT eval obtained.  SNF recommended.   Nutritional status: Body mass index is 25.77 kg/m. Nutrition Problem: Moderate Malnutrition Etiology: chronic illness (cancer) Signs/Symptoms: energy intake < 75% for > or equal to 1 month, mild muscle depletion, mild fat depletion, percent weight loss, moderate fat depletion Percent weight loss: 9 % Diet Order            Diet regular  Room service appropriate? Yes; Fluid consistency: Thin  Diet effective now                 Code Status:   Code Status: DNR  DVT prophylaxis: Place TED hose Start: 07/18/19 0515   Antimicrobials:  None currently Fluid: None  Consultants: Palliative care, oncology Family Communication:  None at bedside   Status  is: Inpatient  Dispo: The patient is from: Home              Anticipated d/c is to: SNF              Anticipated d/c date is: Medically stable for discharge.  Pending SNF              Patient currently is medically stable to d/c.  Palliative care consultation obtained yesterday.  Tentative plan was to discharge patient to home with hospice services. Per patient's wife, she is unable to take care of him at home.   Plan for discharge to SNF with palliative services.   Infusions:  . sodium chloride      Scheduled Meds: . amLODipine  10 mg Oral Daily  . carvedilol  12.5 mg Oral BID  . feeding supplement (ENSURE ENLIVE)  237 mL Oral BID BM  . gabapentin  300 mg Oral QHS  . Lenvatinib 12 mg daily dose  12 mg Oral Daily  . mouth rinse  15 mL Mouth Rinse BID  . melatonin  10 mg Oral QHS  . multivitamin with minerals  1 tablet Oral Daily  . sodium chloride flush  3 mL Intravenous Q12H  . tetrahydrozoline  1 drop Both Eyes TID    Antimicrobials: Anti-infectives (From admission, onward)   None      PRN meds: sodium chloride, acetaminophen, albuterol, benzonatate, cyclobenzaprine, gabapentin, guaiFENesin-dextromethorphan, hydrALAZINE, oxyCODONE, prochlorperazine, sodium chloride, sodium chloride flush   Objective: Vitals:   07/22/19 0553 07/22/19 1241  BP: 138/82 140/83  Pulse: 74 79  Resp: 20 20  Temp: 97.7 F (36.5 C) 98.6 F (37 C)  SpO2: 92% 94%    Intake/Output Summary (Last 24 hours) at 07/22/2019 1438 Last data filed at 07/22/2019 1105 Gross per 24 hour  Intake 240 ml  Output 1400 ml  Net -1160 ml   Filed Weights   07/18/19 0411 07/18/19 0958  Weight: 82.1 kg 86.2 kg   Weight change:  Body mass index is 25.77 kg/m.   Physical Exam: General exam: Appears calm and comfortable.  Generalized discomfort. Skin: No rashes, lesions or ulcers. HEENT: Atraumatic, normocephalic, supple neck, no obvious bleeding Lungs: Clear to auscultation bilaterally CVS: Regular  rate and rhythm, no murmur GI/Abd soft, nontender, nondistended, bowel present CNS: Alert, awake, appropriately answers Psychiatry: Depressed look Extremities: No pedal edema, no calf tenderness  Data Review: I have personally reviewed the laboratory data and studies available.  Recent Labs  Lab 07/18/19 0217 07/19/19 0432 07/20/19 0423 07/21/19 0431  WBC 14.6* 13.6* 12.4* 12.7*  HGB 15.8 14.0 14.0 13.8  HCT 50.8 46.1 45.8 45.3  MCV 77.7* 79.6* 78.8* 79.5*  PLT 96* 112* 114* 97*   Recent Labs  Lab 07/18/19 0217 07/19/19 0432 07/20/19 0423 07/21/19 0431  NA 130* 128* 131* 131*  K 5.0 4.1 4.0 4.7  CL 95* 95* 97* 97*  CO2 20* 23 23 19*  GLUCOSE 90 73 85 92  BUN 20 25* 37* 39*  CREATININE 0.70 0.94 0.93 0.78  CALCIUM 8.4* 7.8* 8.1* 8.1*  MG  --  2.0  --  2.3  PHOS  --   --   --  3.1    Signed, Terrilee Croak, MD [facility] Pager: [phone] (Secure Chat preferred). 07/22/2019

## 2019-07-22 NOTE — TOC Progression Note (Signed)
Transition of Care [facility]) - Progression Note    Patient Details  Name: William Solis MRN: [id number] Date of Birth: [birth year]  [facility] CM/SW Contact  Purcell Mouton, RN Phone Number: 07/22/2019, 12:56 PM  Clinical Narrative:    Spoke with pt's wife who want pt to go to SNF related to pt being weak and no one is there to assist him. Will get [facility] rep to call her to go over their services.    Expected Discharge Plan: [place] Barriers to Discharge: No Barriers Identified  Expected Discharge Plan and Services Expected Discharge Plan: [place]   Discharge Planning Services: CM Consult   Living arrangements for the past 2 months: Single Family Home                                       Social Determinants of Health (SDOH) Interventions    Readmission Risk Interventions No flowsheet data found.

## 2019-07-22 NOTE — Progress Notes (Signed)
[facility] Documentation  Pt has been approved for hospice services at home pending that oral chemo will stop after pt completes what he already has.   Writer spoke with pt's wife who is unsure as to what the best option is for both her and pt. Stated that she does not feel that she can care for pt at home. RN discussed the differences between palliative and hospice in detail. Wife will speak with pt and get back with Probation officer.   Please do not hesitate to outreach with any questions and thank you for the referral.   Freddie Breech, RN  [facility] Liaison [phone]

## 2019-07-22 NOTE — Progress Notes (Signed)
PT Cancellation Note  Patient Details Name: William Solis MRN: [id number] DOB: 1950-08-31   Cancelled Treatment:    Reason Eval/Treat Not Completed: Fatigue/lethargy limiting ability to participate  Pt reports now is not a good time as he is tired and has constant interruptions throughout the night and day keeping him awake.  Pt would just like to rest uninterrupted.  Notified RN and NT and placed sign of door to allow pt time to rest.   William Solis,KATHrine E 07/22/2019, 3:25 PM Arlyce Dice, DPT Acute Rehabilitation Services Pager: [phone] Office: [phone]

## 2019-07-23 NOTE — Progress Notes (Signed)
PROGRESS NOTE  William Solis  DOB: 01-09-1951  PCP: Leamon Arnt, MD ACZ:[id number]  DOA: 07/18/2019  LOS: 4 days   Chief Complaint  Patient presents with  . Fall  . Head Injury  . Weakness   Brief narrative: William Solis is a 69 y.o. male with a history of hepatic carcinoma with pulmonary metastases on lenvatinib, hepatic cirrhosis, HCV, HTN, bipolar disorder, and recently diagnosed PE on xarelto. Patient presented to ED on 6/28 after a fall.  On imaging, he was found to have SDH.  No neurological deficit on exam. Kcentra was given to reverse anticoagulation due to Xarelto.   Neurosurgery recommended observation and serial CT which showed a slight decrease in the small SDH.  Due to recent weakness and now fall, PT was consulted and rec amended SNF placement which is being pursued.  Palliative care and oncology consultation were obtained.  Anticoagulation is being held.   Subjective: Patient was seen and examined this morning.  Pleasant elderly Caucasian male.  Lying down in bed.  Not in distress.  No new symptoms. At this time, patient is pending placement.  Assessment/Plan: Subdural hematoma after a fall  - in setting of anticoagulation.  - s/p Kcentra with improvement on serial CT head.  - xarelto remains on hold.  - Monitor neurological status  Weakness, failure to thrive:  - PT/OT recommend SNF which the patient agrees to, and is being pursued.   Periodic jerking movements:  -Odd presentation as there is minimal tremor component, and only minimal asterixis on exam. These movements are usually spontaneous jerks or loss of muscle contraction affecting all extremities. This may have been the cause of the fall but has been noticed more since admission. He usually only takes gabapentin at bedtime, occasionally an additional dose during the day.  However, for unknown reason, patient was getting it more often in the hospital.  -Jerking movements are suspected to be  secondary to high total dose of gabapentin.  Though he's been getting it TID here per his MAR.  - tell when it has been switched to as needed at bedtime. - PT/OT, fall precautions, assistance with feeding/drinking.   Bilateral segmental PE  -Initially diagnosed on 4/12, still present 6/8. No documented DVT, so IVC filter unlikely to be of benefit. -Prior to admission, patient was on Xarelto.  Per previous documentations, discussion was held between patient and his oncologist as well.  Xarelto is currently on hold.  Post discharge, patient will follow up with his oncologist for discussion about risk and benefit of reinitiation of anticoagulation. -On low-flow oxygen by nasal cannula.  Metastatic hepatic carcinoma:  -Continue lenvatinib per oncology.  -Continue palliative care following at SNF.  Hyponatremia -Sodium remains low, 131 today.  HTN -Blood pressure stable on Coreg and Norvasc.   -HCTZ, lisinopril on hold due to hyponatremia.  -prn hydralazine  Mobility: PT eval obtained.  SNF recommended.   Nutritional status: Body mass index is 25.77 kg/m. Nutrition Problem: Moderate Malnutrition Etiology: chronic illness (cancer) Signs/Symptoms: energy intake < 75% for > or equal to 1 month, mild muscle depletion, mild fat depletion, percent weight loss, moderate fat depletion Percent weight loss: 9 % Diet Order            Diet regular Room service appropriate? Yes; Fluid consistency: Thin  Diet effective now                 Code Status:   Code Status: DNR  DVT prophylaxis: Place  TED hose Start: 07/18/19 0515   Antimicrobials:  None currently Fluid: None  Consultants: Palliative care, oncology Family Communication:  None at bedside   Status is: Inpatient  Dispo: The patient is from: Home              Anticipated d/c is to: SNF with palliative services              Anticipated d/c date is: Medically stable for discharge.  Pending SNF              Patient currently  is medically stable to d/c.    Infusions:  . sodium chloride      Scheduled Meds: . amLODipine  10 mg Oral Daily  . carvedilol  12.5 mg Oral BID  . feeding supplement (ENSURE ENLIVE)  237 mL Oral BID BM  . gabapentin  300 mg Oral QHS  . Lenvatinib 12 mg daily dose  12 mg Oral Daily  . mouth rinse  15 mL Mouth Rinse BID  . melatonin  10 mg Oral QHS  . multivitamin with minerals  1 tablet Oral Daily  . sodium chloride flush  3 mL Intravenous Q12H  . tetrahydrozoline  1 drop Both Eyes TID    Antimicrobials: Anti-infectives (From admission, onward)   None      PRN meds: sodium chloride, acetaminophen, albuterol, benzonatate, cyclobenzaprine, gabapentin, guaiFENesin-dextromethorphan, hydrALAZINE, oxyCODONE, prochlorperazine, sodium chloride, sodium chloride flush   Objective: Vitals:   07/23/19 0614 07/23/19 1234  BP: 133/80 137/81  Pulse: 69 72  Resp: 19 18  Temp: (!) 97.2 F (36.2 C) 97.6 F (36.4 C)  SpO2: 95% 94%    Intake/Output Summary (Last 24 hours) at 07/23/2019 1438 Last data filed at 07/23/2019 8119 Gross per 24 hour  Intake 540 ml  Output 750 ml  Net -210 ml   Filed Weights   07/18/19 0411 07/18/19 0958  Weight: 82.1 kg 86.2 kg   Weight change:  Body mass index is 25.77 kg/m.   Physical Exam: General exam: Appears calm and comfortable.  Not in physical distress Skin: No rashes, lesions or ulcers. HEENT: Atraumatic, normocephalic, supple neck, no obvious bleeding Lungs: Clear to auscultation bilaterally.  No crackles or wheezing. CVS: Regular rate and rhythm, no murmur GI/Abd soft, nontender, nondistended, bowel present CNS: Alert, awake, appropriately answers Psychiatry: Depressed look Extremities: No pedal edema, no calf tenderness  Data Review: I have personally reviewed the laboratory data and studies available.  Recent Labs  Lab 07/18/19 0217 07/19/19 0432 07/20/19 0423 07/21/19 0431  WBC 14.6* 13.6* 12.4* 12.7*  HGB 15.8 14.0 14.0  13.8  HCT 50.8 46.1 45.8 45.3  MCV 77.7* 79.6* 78.8* 79.5*  PLT 96* 112* 114* 97*   Recent Labs  Lab 07/18/19 0217 07/19/19 0432 07/20/19 0423 07/21/19 0431  NA 130* 128* 131* 131*  K 5.0 4.1 4.0 4.7  CL 95* 95* 97* 97*  CO2 20* 23 23 19*  GLUCOSE 90 73 85 92  BUN 20 25* 37* 39*  CREATININE 0.70 0.94 0.93 0.78  CALCIUM 8.4* 7.8* 8.1* 8.1*  MG  --  2.0  --  2.3  PHOS  --   --   --  3.1    Signed, Terrilee Croak, MD [facility] Pager: [phone] (Secure Chat preferred). 07/23/2019

## 2019-07-23 NOTE — Progress Notes (Signed)
PT Cancellation Note  Patient Details Name: William Solis MRN: [id number] DOB: 04/16/1950   Cancelled Treatment:    Reason Eval/Treat Not Completed: Other (comment)Pt declined PT due to nurse tech in room replacing catheter.  When asked if PT could return later, he stated "not today, I'm likely discharging tomorrow."  Will f/u as able. Abran Richard, PT Acute Rehab Services Pager [phone] [facility] [place] 07/23/2019, 3:44 PM

## 2019-07-23 NOTE — Progress Notes (Signed)
Melatonin 10 mg tab, having error with scanned barcode. Called Pharmacy, spoke to [place], made aware of the trouble scanning med. Med given to patient with [name] as 2nd verifier.

## 2019-07-24 LAB — GLUCOSE, CAPILLARY: Glucose-Capillary: 116 mg/dL — ABNORMAL HIGH (ref 70–99)

## 2019-07-24 NOTE — Progress Notes (Signed)
PROGRESS NOTE  William Solis  DOB: Apr 25, 1950  PCP: Leamon Arnt, MD VHQ:[id number]  DOA: 07/18/2019  LOS: 5 days   Chief Complaint  Patient presents with  . Fall  . Head Injury  . Weakness   Brief narrative: William Solis is a 69 y.o. male with a history of hepatic carcinoma with pulmonary metastases on lenvatinib, hepatic cirrhosis, HCV, HTN, bipolar disorder, and recently diagnosed PE on xarelto. Patient presented to ED on 6/28 after a fall.  On imaging, he was found to have SDH.  No neurological deficit on exam. Kcentra was given to reverse anticoagulation due to Xarelto.   Neurosurgery recommended observation and serial CT which showed a slight decrease in the small SDH.  Due to recent weakness and now fall, PT was consulted and recommended SNF placement which is being pursued.  Palliative care and oncology consultation were obtained.  Anticoagulation is being held.   Subjective: Patient was seen and examined this morning. Not in distress.  Pain controlled.  Chart reviewed.  No fever. Heart rate in 70s, blood pressure mostly in the 130s to 140s, more than 90% O2 sat on room air.  Last blood work from 7/1.   Assessment/Plan: Subdural hematoma after a fall  - in setting of anticoagulation.  - s/p Kcentra with improvement on serial CT head.  - xarelto remains on hold.  - Monitor neurological status  Weakness, failure to thrive:  - PT/OT recommend SNF which the patient agrees to, and is being pursued.   Periodic myoclonic jerks -On presentation, patient had intermittent spontaneous jerks. -It was thought to be due to higher dose of gabapentin. Gabapentin has been switched to bedtime only. Myoclonic jerks improved.   Bilateral segmental PE  -Initially diagnosed on 4/12, still present 6/8. No documented DVT, so IVC filter unlikely to be of benefit. -Prior to admission, patient was on Xarelto.  Per previous documentations, discussion was held between patient  and his oncologist as well.  Xarelto is currently on hold.  Post discharge, patient will follow up with his oncologist for discussion about risk and benefit of reinitiation of anticoagulation. -On low-flow oxygen by nasal cannula.  Metastatic hepatic carcinoma:  -Continue lenvatinib per oncology.  -Continue palliative care following at SNF.  Hyponatremia -Sodium remains low, 131 last blood work on 7/1.  HTN -Blood pressure stable on Coreg and Norvasc.   -HCTZ, lisinopril on hold due to hyponatremia.  -prn hydralazine  Mobility: PT eval obtained.  SNF recommended.   Nutritional status: Body mass index is 25.77 kg/m. Nutrition Problem: Moderate Malnutrition Etiology: chronic illness (cancer) Signs/Symptoms: energy intake < 75% for > or equal to 1 month, mild muscle depletion, mild fat depletion, percent weight loss, moderate fat depletion Percent weight loss: 9 % Diet Order            Diet regular Room service appropriate? Yes; Fluid consistency: Thin  Diet effective now                 Code Status:   Code Status: DNR  DVT prophylaxis: Place TED hose Start: 07/18/19 0515   Antimicrobials:  None currently Fluid: None  Consultants: Palliative care, oncology Family Communication:  None at bedside   Status is: Inpatient  Dispo: The patient is from: Home              Anticipated d/c is to: SNF with palliative services              Anticipated d/c date  is: Medically stable for discharge.  Pending SNF              Patient currently is medically stable to d/c.    Infusions:  . sodium chloride      Scheduled Meds: . amLODipine  10 mg Oral Daily  . carvedilol  12.5 mg Oral BID  . feeding supplement (ENSURE ENLIVE)  237 mL Oral BID BM  . gabapentin  300 mg Oral QHS  . Lenvatinib 12 mg daily dose  12 mg Oral Daily  . mouth rinse  15 mL Mouth Rinse BID  . melatonin  10 mg Oral QHS  . multivitamin with minerals  1 tablet Oral Daily  . sodium chloride flush  3 mL  Intravenous Q12H  . tetrahydrozoline  1 drop Both Eyes TID    Antimicrobials: Anti-infectives (From admission, onward)   None      PRN meds: sodium chloride, acetaminophen, albuterol, benzonatate, cyclobenzaprine, gabapentin, guaiFENesin-dextromethorphan, hydrALAZINE, oxyCODONE, prochlorperazine, sodium chloride, sodium chloride flush   Objective: Vitals:   07/23/19 1953 07/24/19 0505  BP: (!) 149/82 (!) 146/84  Pulse: 77 73  Resp: 19 20  Temp: 98.3 F (36.8 C) (!) 97.4 F (36.3 C)  SpO2: 91% 91%    Intake/Output Summary (Last 24 hours) at 07/24/2019 0904 Last data filed at 07/23/2019 2005 Gross per 24 hour  Intake 200 ml  Output 800 ml  Net -600 ml   Filed Weights   07/18/19 0411 07/18/19 0958  Weight: 82.1 kg 86.2 kg   Weight change:  Body mass index is 25.77 kg/m.   Physical Exam: General exam: Appears calm and comfortable.  Not in physical distress.  Pain controlled Skin: No rashes, lesions or ulcers. HEENT: Atraumatic, normocephalic, supple neck, no obvious bleeding Lungs: Clear to auscultation bilaterally.  No crackles or wheezing. CVS: Regular rate and rhythm, no murmur GI/Abd soft, nontender, nondistended, bowel present CNS: Alert, awake, appropriately answers Psychiatry: Depressed look Extremities: No pedal edema, no calf tenderness  Data Review: I have personally reviewed the laboratory data and studies available.  Recent Labs  Lab 07/18/19 0217 07/19/19 0432 07/20/19 0423 07/21/19 0431  WBC 14.6* 13.6* 12.4* 12.7*  HGB 15.8 14.0 14.0 13.8  HCT 50.8 46.1 45.8 45.3  MCV 77.7* 79.6* 78.8* 79.5*  PLT 96* 112* 114* 97*   Recent Labs  Lab 07/18/19 0217 07/19/19 0432 07/20/19 0423 07/21/19 0431  NA 130* 128* 131* 131*  K 5.0 4.1 4.0 4.7  CL 95* 95* 97* 97*  CO2 20* 23 23 19*  GLUCOSE 90 73 85 92  BUN 20 25* 37* 39*  CREATININE 0.70 0.94 0.93 0.78  CALCIUM 8.4* 7.8* 8.1* 8.1*  MG  --  2.0  --  2.3  PHOS  --   --   --  3.1     Signed, Terrilee Croak, MD [facility] Pager: [phone] (Secure Chat preferred). 07/24/2019

## 2019-07-24 NOTE — Progress Notes (Signed)
PT Cancellation Note  Patient Details Name: William Solis MRN: [id number] DOB: July 04, 1950   Cancelled Treatment:    Reason Eval/Treat Not Completed:  Pt declined participation on today. He appears very drowsy-had trouble keeping his eyes open. Explained to pt that he will need to try to work with [place] on tomorrow, if able.  [facility]  Office: [phone] Pager: [phone]       ,

## 2019-07-24 NOTE — Progress Notes (Signed)
Melatonin 5 mg tab, still having Error in scanning barcode. Spoke to Clarise Cruz, Cabin crew to face. Will fix the issue. Med given, [name], 2nd verifier.

## 2019-07-25 MED ORDER — MELATONIN 5 MG PO TABS: 10.0000 mg | ORAL_TABLET | Freq: Every evening | ORAL | Status: DC | PRN

## 2019-07-25 NOTE — Progress Notes (Signed)
PROGRESS NOTE  William Solis  DOB: [birth year]  PCP: William Arnt, MD YIR:[id number]  DOA: 07/18/2019  LOS: 6 days   Chief Complaint  Patient presents with  . Fall  . Head Injury  . Weakness   Brief narrative: William Solis is a 69 y.o. male with a history of hepatic carcinoma with pulmonary metastases on lenvatinib, hepatic cirrhosis, HCV, HTN, bipolar disorder, and recently diagnosed PE on xarelto. Patient presented to ED on 6/28 after a fall.  On imaging, he was found to have SDH.  No neurological deficit on exam. Kcentra was given to reverse anticoagulation due to Xarelto.   Neurosurgery recommended observation and serial CT which showed a slight decrease in the small SDH.  Due to recent weakness and now fall, PT was consulted and recommended SNF placement which is being pursued.  Palliative care and oncology consultation were obtained.  Anticoagulation is being held.   Subjective: Patient was seen and examined this morning. Not in distress.  Pain controlled. No new symptoms.  Essentially no change in last 48 hours  Chart reviewed.  No fever. Heart rate in 70s, blood pressure mostly in the 130s to 140s, more than 90% O2 sat on room air.  Last blood work from 7/1.   Assessment/Plan: Subdural hematoma after a fall  -in setting of anticoagulation.  -s/p Kcentra with improvement on serial CT head.  -xarelto remains on hold.  -Monitor neurological status  Weakness, failure to thrive:  - PT/OT recommend SNF which the patient agrees to, and is being pursued.   Periodic myoclonic jerks -On presentation, patient had intermittent spontaneous jerks. -It was thought to be due to higher dose of gabapentin. Gabapentin has been switched to bedtime only. Myoclonic jerks improved.   Bilateral segmental PE  -Initially diagnosed on 4/12, still present 6/8. No documented DVT, so IVC filter unlikely to be of benefit. -Prior to admission, patient was on Xarelto.  Per  previous documentations, discussion was held between patient and his oncologist as well.  Xarelto is currently on hold.  Post discharge, patient will follow up with his oncologist for discussion about risk and benefit of reinitiation of anticoagulation. -On low-flow oxygen by nasal cannula.  Metastatic hepatic carcinoma:  -Continue lenvatinib per oncology.  -Continue palliative care following at SNF.  Hyponatremia -Sodium remains low, 131 last blood work on 7/1.  HTN -Blood pressure stable on Coreg and Norvasc.   -HCTZ, lisinopril on hold due to hyponatremia.  -prn hydralazine  Mobility: PT eval obtained.  SNF recommended.   Nutritional status: Body mass index is 25.77 kg/m. Nutrition Problem: Moderate Malnutrition Etiology: chronic illness (cancer) Signs/Symptoms: energy intake < 75% for > or equal to 1 month, mild muscle depletion, mild fat depletion, percent weight loss, moderate fat depletion Percent weight loss: 9 % Diet Order            Diet regular Room service appropriate? Yes; Fluid consistency: Thin  Diet effective now                 Code Status:   Code Status: DNR  DVT prophylaxis: Place TED hose Start: 07/18/19 0515   Antimicrobials:  None currently Fluid: None  Consultants: Palliative care, oncology Family Communication: : Updated patient's wife Ms. William Solis  Status is: Inpatient  Dispo: The patient is from: Home              Anticipated d/c is to: SNF with palliative services  Anticipated d/c date is: Medically stable for discharge. Pending SNF.              Patient currently is medically stable to d/c.   Infusions:  . sodium chloride      Scheduled Meds: . amLODipine  10 mg Oral Daily  . carvedilol  12.5 mg Oral BID  . feeding supplement (ENSURE ENLIVE)  237 mL Oral BID BM  . Lenvatinib 12 mg daily dose  12 mg Oral Daily  . mouth rinse  15 mL Mouth Rinse BID  . multivitamin with minerals  1 tablet Oral Daily  . sodium  chloride flush  3 mL Intravenous Q12H  . tetrahydrozoline  1 drop Both Eyes TID    Antimicrobials: Anti-infectives (From admission, onward)   None      PRN meds: sodium chloride, acetaminophen, albuterol, benzonatate, cyclobenzaprine, gabapentin, guaiFENesin-dextromethorphan, hydrALAZINE, melatonin, oxyCODONE, prochlorperazine, sodium chloride, sodium chloride flush   Objective: Vitals:   07/25/19 0549 07/25/19 1427  BP: 121/77 121/90  Pulse: 79 81  Resp:  20  Temp: 98.1 F (36.7 C) 97.7 F (36.5 C)  SpO2: 93% 90%    Intake/Output Summary (Last 24 hours) at 07/25/2019 1626 Last data filed at 07/25/2019 1500 Gross per 24 hour  Intake 660 ml  Output 150 ml  Net 510 ml   Filed Weights   07/18/19 0411 07/18/19 0958  Weight: 82.1 kg 86.2 kg   Weight change:  Body mass index is 25.77 kg/m.   Physical Exam: General exam: Appears calm and comfortable. Not in physical distress. Pain controlled. Skin: No rashes, lesions or ulcers. HEENT: Atraumatic, normocephalic, supple neck, no obvious bleeding Lungs: Clear to auscultation bilaterally.  No crackles or wheezing. CVS: Regular rate and rhythm, no murmur GI/Abd soft, nontender, nondistended, bowel present CNS: Alert, awake, appropriately answers Psychiatry: Depressed look Extremities: No pedal edema, no calf tenderness  Data Review: I have personally reviewed the laboratory data and studies available.  Recent Labs  Lab 07/19/19 0432 07/20/19 0423 07/21/19 0431  WBC 13.6* 12.4* 12.7*  HGB 14.0 14.0 13.8  HCT 46.1 45.8 45.3  MCV 79.6* 78.8* 79.5*  PLT 112* 114* 97*   Recent Labs  Lab 07/19/19 0432 07/20/19 0423 07/21/19 0431  NA 128* 131* 131*  K 4.1 4.0 4.7  CL 95* 97* 97*  CO2 23 23 19*  GLUCOSE 73 85 92  BUN 25* 37* 39*  CREATININE 0.94 0.93 0.78  CALCIUM 7.8* 8.1* 8.1*  MG 2.0  --  2.3  PHOS  --   --  3.1   Signed, Terrilee Croak, MD [facility] Pager: [phone] (Secure Chat  preferred). 07/25/2019

## 2019-07-25 NOTE — Progress Notes (Signed)
PT Cancellation Note  Patient Details Name: WHEELER INCORVAIA MRN: [id number] DOB: 1950/08/17   Cancelled Treatment:    Reason Eval/Treat Not Completed: Medical issues which prohibited therapy , patient currently being fed, coughing. Patient noted now with tremors of the  Head, appears much worse tremors/jerking. Will check back. Patient stated" I am leaving today."  Claretha Cooper 07/25/2019, 9:26 AM  [place] Pager [phone] Office [phone]

## 2019-07-25 NOTE — Care Management Important Message (Signed)
Important Message  Patient Details IM Letter given to Gabriel Earing RN Case Manager to present to the Patient Name: William Solis MRN: [id number] Date of Birth: 1950/06/23   Medicare Important Message Given:  Yes     Kerin Salen 07/25/2019, 10:54 AM

## 2019-07-26 DIAGNOSIS — R52 Pain, unspecified: Secondary | ICD-10-CM | POA: Diagnosis not present

## 2019-07-26 DIAGNOSIS — R58 Hemorrhage, not elsewhere classified: Secondary | ICD-10-CM | POA: Diagnosis not present

## 2019-07-26 DIAGNOSIS — M2550 Pain in unspecified joint: Secondary | ICD-10-CM | POA: Diagnosis not present

## 2019-07-26 DIAGNOSIS — C220 Liver cell carcinoma: Secondary | ICD-10-CM | POA: Diagnosis not present

## 2019-07-26 DIAGNOSIS — C3490 Malignant neoplasm of unspecified part of unspecified bronchus or lung: Secondary | ICD-10-CM | POA: Diagnosis not present

## 2019-07-26 DIAGNOSIS — F319 Bipolar disorder, unspecified: Secondary | ICD-10-CM | POA: Diagnosis not present

## 2019-07-26 DIAGNOSIS — R531 Weakness: Secondary | ICD-10-CM | POA: Diagnosis not present

## 2019-07-26 DIAGNOSIS — Z7401 Bed confinement status: Secondary | ICD-10-CM | POA: Diagnosis not present

## 2019-07-26 DIAGNOSIS — E440 Moderate protein-calorie malnutrition: Secondary | ICD-10-CM | POA: Diagnosis not present

## 2019-07-26 DIAGNOSIS — R7989 Other specified abnormal findings of blood chemistry: Secondary | ICD-10-CM | POA: Diagnosis not present

## 2019-07-26 DIAGNOSIS — Z20822 Contact with and (suspected) exposure to covid-19: Secondary | ICD-10-CM | POA: Diagnosis not present

## 2019-07-26 DIAGNOSIS — G4733 Obstructive sleep apnea (adult) (pediatric): Secondary | ICD-10-CM | POA: Diagnosis not present

## 2019-07-26 DIAGNOSIS — R627 Adult failure to thrive: Secondary | ICD-10-CM | POA: Diagnosis not present

## 2019-07-26 DIAGNOSIS — W19XXXA Unspecified fall, initial encounter: Secondary | ICD-10-CM | POA: Diagnosis not present

## 2019-07-26 DIAGNOSIS — R5381 Other malaise: Secondary | ICD-10-CM | POA: Diagnosis not present

## 2019-07-26 DIAGNOSIS — C7800 Secondary malignant neoplasm of unspecified lung: Secondary | ICD-10-CM | POA: Diagnosis not present

## 2019-07-26 DIAGNOSIS — R0902 Hypoxemia: Secondary | ICD-10-CM | POA: Diagnosis not present

## 2019-07-26 DIAGNOSIS — C7802 Secondary malignant neoplasm of left lung: Secondary | ICD-10-CM | POA: Diagnosis not present

## 2019-07-26 DIAGNOSIS — R0489 Hemorrhage from other sites in respiratory passages: Secondary | ICD-10-CM | POA: Diagnosis not present

## 2019-07-26 DIAGNOSIS — R600 Localized edema: Secondary | ICD-10-CM | POA: Diagnosis not present

## 2019-07-26 DIAGNOSIS — I10 Essential (primary) hypertension: Secondary | ICD-10-CM | POA: Diagnosis not present

## 2019-07-26 DIAGNOSIS — R911 Solitary pulmonary nodule: Secondary | ICD-10-CM | POA: Diagnosis not present

## 2019-07-26 DIAGNOSIS — Z8619 Personal history of other infectious and parasitic diseases: Secondary | ICD-10-CM | POA: Diagnosis not present

## 2019-07-26 DIAGNOSIS — R0602 Shortness of breath: Secondary | ICD-10-CM | POA: Diagnosis not present

## 2019-07-26 DIAGNOSIS — Z9181 History of falling: Secondary | ICD-10-CM | POA: Diagnosis not present

## 2019-07-26 DIAGNOSIS — C787 Secondary malignant neoplasm of liver and intrahepatic bile duct: Secondary | ICD-10-CM | POA: Diagnosis not present

## 2019-07-26 DIAGNOSIS — Z66 Do not resuscitate: Secondary | ICD-10-CM | POA: Diagnosis not present

## 2019-07-26 DIAGNOSIS — J189 Pneumonia, unspecified organism: Secondary | ICD-10-CM | POA: Diagnosis not present

## 2019-07-26 DIAGNOSIS — Z515 Encounter for palliative care: Secondary | ICD-10-CM | POA: Diagnosis not present

## 2019-07-26 DIAGNOSIS — J9621 Acute and chronic respiratory failure with hypoxia: Secondary | ICD-10-CM | POA: Diagnosis not present

## 2019-07-26 DIAGNOSIS — I2699 Other pulmonary embolism without acute cor pulmonale: Secondary | ICD-10-CM | POA: Diagnosis not present

## 2019-07-26 DIAGNOSIS — M6281 Muscle weakness (generalized): Secondary | ICD-10-CM | POA: Diagnosis not present

## 2019-07-26 DIAGNOSIS — R188 Other ascites: Secondary | ICD-10-CM | POA: Diagnosis not present

## 2019-07-26 DIAGNOSIS — S065X0D Traumatic subdural hemorrhage without loss of consciousness, subsequent encounter: Secondary | ICD-10-CM | POA: Diagnosis not present

## 2019-07-26 DIAGNOSIS — R042 Hemoptysis: Secondary | ICD-10-CM | POA: Diagnosis not present

## 2019-07-26 MED ORDER — OXYCODONE HCL 10 MG PO TABS: 10.0000 mg | ORAL_TABLET | ORAL | 0 refills | Status: AC | PRN

## 2019-07-26 MED ORDER — ENSURE ENLIVE PO LIQD: 237.0000 mL | ORAL | Status: DC

## 2019-07-26 MED ORDER — ALBUTEROL SULFATE (2.5 MG/3ML) 0.083% IN NEBU: 2.5000 mg | INHALATION_SOLUTION | Freq: Four times a day (QID) | RESPIRATORY_TRACT | 12 refills | Status: AC | PRN

## 2019-07-26 MED ORDER — ENSURE ENLIVE PO LIQD: 237.0000 mL | Freq: Two times a day (BID) | ORAL | 12 refills | Status: AC

## 2019-07-26 MED ORDER — BENZONATATE 100 MG PO CAPS: 100.0000 mg | ORAL_CAPSULE | Freq: Two times a day (BID) | ORAL | 0 refills | Status: AC | PRN

## 2019-07-26 MED ORDER — GABAPENTIN 300 MG PO CAPS: 300.0000 mg | ORAL_CAPSULE | Freq: Every day | ORAL | 0 refills | Status: AC | PRN

## 2019-07-26 MED ORDER — GUAIFENESIN-DM 100-10 MG/5ML PO SYRP: 5.0000 mL | ORAL_SOLUTION | ORAL | 0 refills | Status: AC | PRN

## 2019-07-26 NOTE — Consult Note (Signed)
   [facility] Inpatient Consult   07/26/2019  JHONNY CALIXTO 1950-08-06 [id number]   Patient chart reviewed for [place] [facility] CM) needs due to unplanned readmission risk score of 25%, high. Per review, current disposition plan is for SNF. No [facility] CM needs.  Netta Cedars, MSN, [facility] Liaison Nurse Mobile Phone [phone]  Toll free office [phone]

## 2019-07-26 NOTE — Progress Notes (Signed)
Patient ID: William Solis, male   DOB: [birth year], 69 y.o.   MRN: [id number]  This NP visited patient at the bedside as a follow up to for palliative medicine needs and emotional support.  William Solis is extremely weak, he is frustrated and tired "of all this". His functional status is poor. His abdomen is distended and tender to touch.  He tells me that the plan is that he will be discharged to a skilled nursing facility for rehabilitation.  He does want to continue with his chemotherapy and continue "to try to get better"  Spoke by phone with wife/William Solis for continued converaton regarding current medcial situation.  Education offered regarding concept specific to failure to thrive and the limitations of medical interventions to provide prolonged quality of life when the body does fail to thrive.   Patient and his wife are both aware of the seriousness of his illness,   Education offered regarding the natural trajectory and expectations at EOL.   Recommend palliative services at facility and is shift to hospice when patient is open to services.  Education offered regarding hospice services at residential hospice.  Patient tells me "I am not ready for that".  I believe that if the patient was in the mindset of a full comfort path he would indeed be eligible for residential hospice.  Emotional support offered  Discussed with patient/family  the importance of continued conversation with each other  and their  medical providers regarding overall plan of care and treatment options,  ensuring decisions are within the context of the patients values and GOCs.  Patient is high risk for decompensation.  Total time spent on the unit was 35 minutes  Greater than 50% of the time was spent in counseling and coordination of care  Wadie Lessen NP  Palliative Medicine Team Team Phone # [phone] Pager [phone]

## 2019-07-26 NOTE — Progress Notes (Signed)
Nutrition Follow-up  DOCUMENTATION CODES:   Non-severe (moderate) malnutrition in context of chronic illness  INTERVENTION:  - continue Magic Cup TID. - will decrease Ensure Enlive from BID to once/day. - will monitor for additional nutrition-related needs if patient is unable to d/c today.   NUTRITION DIAGNOSIS:   Moderate Malnutrition related to chronic illness (cancer) as evidenced by energy intake < 75% for > or equal to 1 month, mild muscle depletion, mild fat depletion, percent weight loss, moderate fat depletion. -ongoing  GOAL:   Patient will meet greater than or equal to 90% of their needs -improving  MONITOR:   PO intake, Supplement acceptance, Weight trends, Labs, I & O's  ASSESSMENT:   Pt admitted with small SDH s/p fall. PMH includes hepatocarcinoma progressive with pulmonary mets, cirrhosis, asterixis, HTN, Hep C, bipolar disorder.  Discharge order and discharge summary entered earlier this AM for discharge to SNF. Per flow sheet documentation, he ate 0% of breakfast and 25% of dinner on 7/4; 100% of breakfast, 75% of lunch, 0% of dinner on 7/5; 100% of breakfast this AM. He has been accepting Ensure a little less than 50% of the time offered.   Weight today appears to have been copied forward from 7/6.   Labs reviewed; Na: 131 mmol/l, Cl: 97 mol/l, BUN: 39 mg/dl, Ca: 8.1 mg/dl. Medications reviewed; 1 tablet multivitamin with minerals.   Diet Order:   Diet Order            Diet regular Room service appropriate? Yes; Fluid consistency: Thin  Diet effective now                 EDUCATION NEEDS:   Education needs have been addressed  Skin:  Skin Assessment: Reviewed RN Assessment  Last BM:  7/4  Height:   Ht Readings from Last 1 Encounters:  07/26/19 6' (1.829 m)    Weight:   Wt Readings from Last 1 Encounters:  07/26/19 86.2 kg     Estimated Nutritional Needs:  Kcal:  2449-7530 Protein:  115-130 grams Fluid:  >2L     Jarome Matin, MS, RD, LDN, CNSC Inpatient Clinical Dietitian RD pager # available in [facility]  After hours/weekend pager # available in [facility]

## 2019-07-26 NOTE — Discharge Summary (Signed)
Physician Discharge Summary  William Solis RXV:[id number] DOB: [birth year] DOA: 07/18/2019  PCP: William Arnt, MD  Admit date: 07/18/2019 Discharge date: 07/26/2019  Admitted From: Home Discharge disposition: SNF   Code Status: DNR  Diet Recommendation: As tolerated   Recommendations for Outpatient Follow-Up:   1. Follow-up with PCP as an outpatient 2. Follow-up with oncology as an outpatient  Discharge Diagnosis:   Active Problems:   Essential hypertension   MDD (major depressive disorder)   Hepatocellular carcinoma (HCC)   SDH (subdural hematoma) (HCC)   Failure to thrive in adult   Palliative care by specialist   DNR (do not resuscitate)   Malnutrition of moderate degree   Weakness generalized   History of Present Illness / Brief narrative:  William Solis a68 y.o.malewith a history of hepatic carcinoma with pulmonary metastases on lenvatinib, hepatic cirrhosis, HCV, HTN, bipolar disorder, and recently diagnosed PE on xarelto. Patient presented to ED on 6/28 after a fall.  On imaging, he was found to have SDH.  No neurological deficit on exam. Kcentra was given to reverse anticoagulation due to Xarelto.   Neurosurgery recommended observation and serial CT which showed a slight decrease in the small SDH.  Due to recent weakness and now fall, PT was consulted and recommended SNF placement which is being pursued.  Palliative care and oncology consultation were obtained.  Anticoagulation is being held.   Hospital Course:  Subdural hematoma after a fall  -in setting of anticoagulation.  -s/p Kcentra with improvement on serial CT head.  -xarelto remains on hold.  -Monitor neurological status  Weakness, failure to thrive:  - PT/OT recommend SNF.  Periodic myoclonic jerks -On presentation, patient had intermittent spontaneous jerks. -It was thought to be due to higher dose of gabapentin. Gabapentin has been switched to bedtime PRN only. Myoclonic jerks  improved.   Bilateral segmental PE  -Initially diagnosed on 4/12, still present 6/8. No documented DVT, so IVC filter unlikely to be of benefit. -Prior to admission, patient was on Xarelto.  Per previous documentations, discussion was held between patient and his oncologist as well.  Xarelto is currently on hold.  Post discharge, patient will follow up with his oncologist for discussion about risk and benefit of reinitiation of anticoagulation. -On low-flow oxygen by nasal cannula.  Metastatic hepatic carcinoma:  -Continue lenvatinib per oncology.  -Continue palliative care following at SNF.  Hyponatremia -Sodium remains low, 131 last blood work on 7/1.  HTN -Blood pressure stable on Coreg and Norvasc.   -HCTZ, lisinopril on hold due to hyponatremia.   Mobility: PT eval obtained.  SNF recommended.   Nutritional status: Body mass index is 25.77 kg/m. Nutrition Problem: Moderate Malnutrition Etiology: chronic illness (cancer) Signs/Symptoms: energy intake < 75% for > or equal to 1 month, mild muscle depletion, mild fat depletion, percent weight loss, moderate fat depletion Percent weight loss: 9 %  Code Status:  Code Status: DNR    Antimicrobials: None currently  Consultants: Palliative care, oncology Family Communication:: Updated patient's wife Ms. William Solis to discharge today to SNF with palliative services.  Wound care:    Subjective:  Seen and examined this morning.  Elderly Caucasian male.  Lying down in bed.  Opens eyes on verbal command.  Pain controlled.  Discharge Exam:   Vitals:   07/25/19 0549 07/25/19 1427 07/25/19 2109 07/26/19 0531  BP: 121/77 121/90 128/77 133/81  Pulse: 79 81 87 76  Resp:  20 20 (!) 22  Temp:  98.1 F (36.7 C) 97.7 F (36.5 C) 98.1 F (36.7 C) 99 F (37.2 C)  TempSrc: Oral Oral Oral Oral  SpO2: 93% 90% 92% 93%  Weight:    86.2 kg  Height:    6' (1.829 m)    Body mass index is 25.77 kg/m.  General exam:  Appears calm and comfortable.  Not in physical distress Skin: No rashes, lesions or ulcers. HEENT: Atraumatic, normocephalic, supple neck, no obvious bleeding Lungs: Clear to auscultate bilaterally CVS: Regular rate and rhythm, no murmur GI/Abd soft, nontender, nondistended, bowel sound present CNS: Opens eyes on verbal command, oriented to place and person Psychiatry: Depressed look Extremities: No pedal edema, no calf tenderness  Discharge Instructions:   Discharge Instructions    Amb Referral to Palliative Care   Complete by: As directed    Increase activity slowly   Complete by: As directed       Follow-up Information    William Arnt, MD Follow up.   Specialty: Family Medicine Contact information: [address] [place] [address] [phone]              Allergies as of 07/26/2019   No Known Allergies     Medication List    STOP taking these medications   citalopram 10 MG tablet Commonly known as: CELEXA   hydrochlorothiazide 25 MG tablet Commonly known as: HYDRODIURIL   lisinopril 40 MG tablet Commonly known as: ZESTRIL   Melatonin 10 MG Tabs   rivaroxaban 20 MG Tabs tablet Commonly known as: XARELTO     TAKE these medications   acetaminophen 500 MG tablet Commonly known as: TYLENOL Take 500 mg by mouth every 6 (six) hours as needed for mild pain or headache.   albuterol (2.5 MG/3ML) 0.083% nebulizer solution Commonly known as: PROVENTIL Take 3 mLs (2.5 mg total) by nebulization every 6 (six) hours as needed for wheezing or shortness of breath.   amLODipine 10 MG tablet Commonly known as: NORVASC TAKE 1 TABLET EVERY DAY   ARNICA EX Apply 1 application topically 3 (three) times daily as needed (arthritis pain).   benzonatate 100 MG capsule Commonly known as: TESSALON Take 1 capsule (100 mg total) by mouth 2 (two) times daily as needed for cough.   carvedilol 12.5 MG tablet Commonly known as: COREG Take 1 tablet (12.5 mg total) by  mouth 2 (two) times daily.   cholecalciferol 25 MCG (1000 UNIT) tablet Commonly known as: VITAMIN D3 Take 1,000 Units by mouth daily.   cyclobenzaprine 10 MG tablet Commonly known as: FLEXERIL Take 10 mg by mouth 3 (three) times daily as needed for muscle spasms.   feeding supplement (ENSURE ENLIVE) Liqd Take 237 mLs by mouth 2 (two) times daily between meals.   gabapentin 300 MG capsule Commonly known as: NEURONTIN Take 1 capsule (300 mg total) by mouth daily as needed for up to 7 days (pain). What changed:   when to take this  reasons to take this   guaiFENesin-dextromethorphan 100-10 MG/5ML syrup Commonly known as: ROBITUSSIN DM Take 5 mLs by mouth every 4 (four) hours as needed for cough.   Lenvima (12 MG Daily Dose) 3 x 4 MG capsule Generic drug: Lenvatinib 12 mg daily dose Take 12 mg by mouth daily.   levalbuterol 45 MCG/ACT inhaler Commonly known as: XOPENEX HFA Inhale 1 puff into the lungs every 6 (six) hours as needed for wheezing.   lidocaine 5 % Commonly known as: LIDODERM Place 1 patch onto the  skin daily as needed (pain). Pain. Remove & Discard patch within 12 hours or as directed by MD   multivitamin with minerals tablet Take 1 tablet by mouth daily.   Oxycodone HCl 10 MG Tabs Take 1 tablet (10 mg total) by mouth every 4 (four) hours as needed for up to 3 days (pain).   prochlorperazine 10 MG tablet Commonly known as: COMPAZINE Take 1 tablet (10 mg total) by mouth every 6 (six) hours as needed for nausea.   sodium chloride 0.65 % Soln nasal spray Commonly known as: OCEAN Place 1 spray into both nostrils as needed for congestion.   VISINE OP Place 1 drop into both eyes 3 (three) times daily as needed (dry eyes).   VITAMIN B-12 PO Take 1 tablet by mouth every 3 (three) days.   vitamin C 1000 MG tablet Take 500 mg by mouth daily.   vitamin E 180 MG (400 UNITS) capsule Generic drug: vitamin E Take 400 Units by mouth daily.       Time  coordinating discharge: 35 minutes  The results of significant diagnostics from this hospitalization (including imaging, microbiology, ancillary and laboratory) are listed below for reference.    Procedures and Diagnostic Studies:   CT HEAD WO CONTRAST  Result Date: 07/18/2019 CLINICAL DATA:  Subdural hematoma, follow-up EXAM: CT HEAD WITHOUT CONTRAST TECHNIQUE: Contiguous axial images were obtained from the base of the skull through the vertex without intravenous contrast. COMPARISON:  07/18/2019 FINDINGS: Brain: Small subdural hematoma along the posterior falx measuring 4 mm compared to 6 mm previously. Blood extends along the right side of the tentorium. No mass effect or midline shift. No new areas of hemorrhage. Vascular: No hyperdense vessel or unexpected calcification. Skull: No acute calvarial abnormality. Sinuses/Orbits: Visualized paranasal sinuses and mastoids clear. Orbital soft tissues unremarkable. Other: None IMPRESSION: Small subdural hematoma along the posterior falx and right tentorium, slightly decreased in size since prior study. Electronically Signed   By: Rolm Baptise M.D.   On: 07/18/2019 15:11   CT HEAD WO CONTRAST  Result Date: 07/18/2019 CLINICAL DATA:  [place], hit head, anticoagulated, hepatocellular carcinoma EXAM: CT HEAD WITHOUT CONTRAST TECHNIQUE: Contiguous axial images were obtained from the base of the skull through the vertex without intravenous contrast. COMPARISON:  None. FINDINGS: Brain: There is a posterior falcine subdural hematoma measuring up to 6 mm. The subdural extends along the right side tentorium. No mass effect. No acute infarct. Lateral ventricles and remaining midline structures are unremarkable. Vascular: No hyperdense vessel or unexpected calcification. Skull: Normal. Negative for fracture or focal lesion. Sinuses/Orbits: Minimal fluid within the left maxillary sinus. Remaining paranasal sinuses are clear. Other: None. IMPRESSION: 1. Small posterior  falcine subdural hematoma extending along the right side tentorium. Maximal thickness measures 6 mm. No mass effect. These results were called by telephone at the time of interpretation on 07/18/2019 at 2:20 am to provider PA Quincy Carnes, who verbally acknowledged these results. Electronically Signed   By: Randa Ngo M.D.   On: 07/18/2019 02:21     Labs:   Basic Metabolic Panel: Recent Labs  Lab 07/20/19 0423 07/21/19 0431  NA 131* 131*  K 4.0 4.7  CL 97* 97*  CO2 23 19*  GLUCOSE 85 92  BUN 37* 39*  CREATININE 0.93 0.78  CALCIUM 8.1* 8.1*  MG  --  2.3  PHOS  --  3.1   GFR Estimated Creatinine Clearance: 99.7 mL/min (by C-G formula based on SCr of 0.78 mg/dL). Liver Function Tests:  No results for input(s): AST, ALT, ALKPHOS, BILITOT, PROT, ALBUMIN in the last 168 hours. No results for input(s): LIPASE, AMYLASE in the last 168 hours. No results for input(s): AMMONIA in the last 168 hours. Coagulation profile No results for input(s): INR, PROTIME in the last 168 hours.  CBC: Recent Labs  Lab 07/20/19 0423 07/21/19 0431  WBC 12.4* 12.7*  HGB 14.0 13.8  HCT 45.8 45.3  MCV 78.8* 79.5*  PLT 114* 97*   Cardiac Enzymes: No results for input(s): CKTOTAL, CKMB, CKMBINDEX, TROPONINI in the last 168 hours. BNP: Invalid input(s): POCBNP CBG: Recent Labs  Lab 07/24/19 2208  GLUCAP 116*   D-Dimer No results for input(s): DDIMER in the last 72 hours. Hgb A1c No results for input(s): HGBA1C in the last 72 hours. Lipid Profile No results for input(s): CHOL, HDL, LDLCALC, TRIG, CHOLHDL, LDLDIRECT in the last 72 hours. Thyroid function studies No results for input(s): TSH, T4TOTAL, T3FREE, THYROIDAB in the last 72 hours.  Invalid input(s): FREET3 Anemia work up No results for input(s): VITAMINB12, FOLATE, FERRITIN, TIBC, IRON, RETICCTPCT in the last 72 hours. Microbiology Recent Results (from the past 240 hour(s))  SARS Coronavirus 2 by RT PCR (hospital order,  performed in [facility] hospital lab) Nasopharyngeal Nasopharyngeal Swab     Status: None   Collection Time: 07/18/19  4:14 AM   Specimen: Nasopharyngeal Swab  Result Value Ref Range Status   SARS Coronavirus 2 NEGATIVE NEGATIVE Final    Comment: (NOTE) SARS-CoV-2 target nucleic acids are NOT DETECTED.  The SARS-CoV-2 RNA is generally detectable in upper and lower respiratory specimens during the acute phase of infection. The lowest concentration of SARS-CoV-2 viral copies this assay can detect is 250 copies / mL. A negative result does not preclude SARS-CoV-2 infection and should not be used as the sole basis for treatment or other patient management decisions.  A negative result may occur with improper specimen collection / handling, submission of specimen other than nasopharyngeal swab, presence of viral mutation(s) within the areas targeted by this assay, and inadequate number of viral copies (<250 copies / mL). A negative result must be combined with clinical observations, patient history, and epidemiological information.  Fact Sheet for Patients:   StrictlyIdeas.no  Fact Sheet for Healthcare Providers: BankingDealers.co.za  This test is not yet approved or  cleared by the [place] FDA and has been authorized for detection and/or diagnosis of SARS-CoV-2 by FDA under an Emergency Use Authorization (EUA).  This EUA will remain in effect (meaning this test can be used) for the duration of the COVID-19 declaration under Section 564(b)(1) of the Act, 21 U.S.C. section 360bbb-3(b)(1), unless the authorization is terminated or revoked sooner.  Performed at [facility], [place] [address], [place], [place] [address]     Please note: You were cared for by a hospitalist during your hospital stay. Once you are discharged, your primary care physician will handle any further medical issues. Please note that NO REFILLS  for any discharge medications will be authorized once you are discharged, as it is imperative that you return to your primary care physician (or establish a relationship with a primary care physician if you do not have one) for your post hospital discharge needs so that they can reassess your need for medications and monitor your lab values.  Signed: Marlowe Aschoff Haille Pardi  [facility] 07/26/2019, 10:32 AM

## 2019-07-26 NOTE — TOC Progression Note (Signed)
Transition of Care [facility]) - Progression Note    Patient Details  Name: William Solis MRN: [id number] Date of Birth: 12/29/1950  [facility]) CM/SW Contact  Purcell Mouton, RN Phone Number: 07/26/2019, 11:02 AM  Clinical Narrative:    Pt was informed that his bed was ready at [facility]. Wife was called to make her aware also.    Expected Discharge Plan: [place] Barriers to Discharge: No Barriers Identified  Expected Discharge Plan and Services Expected Discharge Plan: [place]   Discharge Planning Services: CM Consult   Living arrangements for the past 2 months: Single Family Home Expected Discharge Date: 07/26/19                                     Social Determinants of Health (SDOH) Interventions    Readmission Risk Interventions No flowsheet data found.

## 2019-07-27 ENCOUNTER — Emergency Department (HOSPITAL_COMMUNITY): Payer: Medicare HMO

## 2019-07-27 ENCOUNTER — Inpatient Hospital Stay (HOSPITAL_COMMUNITY): Payer: Medicare HMO

## 2019-07-27 ENCOUNTER — Encounter (HOSPITAL_COMMUNITY): Payer: Self-pay | Admitting: Emergency Medicine

## 2019-07-27 ENCOUNTER — Other Ambulatory Visit: Payer: Self-pay

## 2019-07-27 ENCOUNTER — Inpatient Hospital Stay (HOSPITAL_COMMUNITY)
Admission: EM | Admit: 2019-07-27 | Discharge: 2019-07-29 | Disposition: A | Discharge status: Hospice | Payer: Medicare HMO | Source: Home / Self Care | Attending: Internal Medicine | Admitting: Internal Medicine

## 2019-07-27 ENCOUNTER — Telehealth: Payer: Self-pay

## 2019-07-27 DIAGNOSIS — Z8619 Personal history of other infectious and parasitic diseases: Secondary | ICD-10-CM

## 2019-07-27 DIAGNOSIS — R188 Other ascites: Secondary | ICD-10-CM | POA: Diagnosis present

## 2019-07-27 DIAGNOSIS — R042 Hemoptysis: Secondary | ICD-10-CM | POA: Diagnosis not present

## 2019-07-27 DIAGNOSIS — Z9981 Dependence on supplemental oxygen: Secondary | ICD-10-CM

## 2019-07-27 DIAGNOSIS — F319 Bipolar disorder, unspecified: Secondary | ICD-10-CM | POA: Insufficient documentation

## 2019-07-27 DIAGNOSIS — B1920 Unspecified viral hepatitis C without hepatic coma: Secondary | ICD-10-CM | POA: Diagnosis present

## 2019-07-27 DIAGNOSIS — Z66 Do not resuscitate: Secondary | ICD-10-CM

## 2019-07-27 DIAGNOSIS — J189 Pneumonia, unspecified organism: Secondary | ICD-10-CM | POA: Diagnosis not present

## 2019-07-27 DIAGNOSIS — Z20822 Contact with and (suspected) exposure to covid-19: Secondary | ICD-10-CM | POA: Diagnosis present

## 2019-07-27 DIAGNOSIS — R0489 Hemorrhage from other sites in respiratory passages: Secondary | ICD-10-CM

## 2019-07-27 DIAGNOSIS — C7800 Secondary malignant neoplasm of unspecified lung: Secondary | ICD-10-CM

## 2019-07-27 DIAGNOSIS — Z6825 Body mass index (BMI) 25.0-25.9, adult: Secondary | ICD-10-CM

## 2019-07-27 DIAGNOSIS — J9621 Acute and chronic respiratory failure with hypoxia: Secondary | ICD-10-CM | POA: Diagnosis present

## 2019-07-27 DIAGNOSIS — I2699 Other pulmonary embolism without acute cor pulmonale: Secondary | ICD-10-CM

## 2019-07-27 DIAGNOSIS — Z89432 Acquired absence of left foot: Secondary | ICD-10-CM

## 2019-07-27 DIAGNOSIS — W19XXXD Unspecified fall, subsequent encounter: Secondary | ICD-10-CM | POA: Diagnosis present

## 2019-07-27 DIAGNOSIS — I700 Atherosclerosis of aorta: Secondary | ICD-10-CM | POA: Diagnosis present

## 2019-07-27 DIAGNOSIS — Z79899 Other long term (current) drug therapy: Secondary | ICD-10-CM

## 2019-07-27 DIAGNOSIS — F419 Anxiety disorder, unspecified: Secondary | ICD-10-CM | POA: Diagnosis present

## 2019-07-27 DIAGNOSIS — G8929 Other chronic pain: Secondary | ICD-10-CM | POA: Diagnosis present

## 2019-07-27 DIAGNOSIS — G4733 Obstructive sleep apnea (adult) (pediatric): Secondary | ICD-10-CM | POA: Diagnosis present

## 2019-07-27 DIAGNOSIS — S065X9D Traumatic subdural hemorrhage with loss of consciousness of unspecified duration, subsequent encounter: Secondary | ICD-10-CM

## 2019-07-27 DIAGNOSIS — M545 Low back pain: Secondary | ICD-10-CM | POA: Diagnosis present

## 2019-07-27 DIAGNOSIS — S065X9A Traumatic subdural hemorrhage with loss of consciousness of unspecified duration, initial encounter: Principal | ICD-10-CM

## 2019-07-27 DIAGNOSIS — C3490 Malignant neoplasm of unspecified part of unspecified bronchus or lung: Secondary | ICD-10-CM | POA: Diagnosis not present

## 2019-07-27 DIAGNOSIS — F909 Attention-deficit hyperactivity disorder, unspecified type: Secondary | ICD-10-CM | POA: Diagnosis present

## 2019-07-27 DIAGNOSIS — C220 Liver cell carcinoma: Secondary | ICD-10-CM | POA: Diagnosis present

## 2019-07-27 DIAGNOSIS — R911 Solitary pulmonary nodule: Secondary | ICD-10-CM | POA: Diagnosis not present

## 2019-07-27 DIAGNOSIS — S065XAA Traumatic subdural hemorrhage with loss of consciousness status unknown, initial encounter: Secondary | ICD-10-CM | POA: Diagnosis present

## 2019-07-27 DIAGNOSIS — R600 Localized edema: Secondary | ICD-10-CM | POA: Diagnosis present

## 2019-07-27 DIAGNOSIS — E440 Moderate protein-calorie malnutrition: Secondary | ICD-10-CM | POA: Diagnosis present

## 2019-07-27 DIAGNOSIS — I10 Essential (primary) hypertension: Secondary | ICD-10-CM | POA: Diagnosis present

## 2019-07-27 DIAGNOSIS — C7801 Secondary malignant neoplasm of right lung: Secondary | ICD-10-CM | POA: Diagnosis present

## 2019-07-27 DIAGNOSIS — G5790 Unspecified mononeuropathy of unspecified lower limb: Secondary | ICD-10-CM | POA: Diagnosis present

## 2019-07-27 DIAGNOSIS — Z87891 Personal history of nicotine dependence: Secondary | ICD-10-CM

## 2019-07-27 DIAGNOSIS — Z8249 Family history of ischemic heart disease and other diseases of the circulatory system: Secondary | ICD-10-CM

## 2019-07-27 DIAGNOSIS — Z86711 Personal history of pulmonary embolism: Secondary | ICD-10-CM

## 2019-07-27 DIAGNOSIS — Z515 Encounter for palliative care: Secondary | ICD-10-CM | POA: Diagnosis present

## 2019-07-27 DIAGNOSIS — Z9989 Dependence on other enabling machines and devices: Secondary | ICD-10-CM

## 2019-07-27 DIAGNOSIS — R7989 Other specified abnormal findings of blood chemistry: Secondary | ICD-10-CM

## 2019-07-27 DIAGNOSIS — G4700 Insomnia, unspecified: Secondary | ICD-10-CM | POA: Diagnosis present

## 2019-07-27 DIAGNOSIS — C7802 Secondary malignant neoplasm of left lung: Secondary | ICD-10-CM | POA: Diagnosis present

## 2019-07-27 LAB — COMPREHENSIVE METABOLIC PANEL
ALT: 203 U/L — ABNORMAL HIGH (ref 0–44)
AST: 227 U/L — ABNORMAL HIGH (ref 15–41)
Albumin: 2 g/dL — ABNORMAL LOW (ref 3.5–5.0)
Alkaline Phosphatase: 282 U/L — ABNORMAL HIGH (ref 38–126)
Anion gap: 11 (ref 5–15)
BUN: 35 mg/dL — ABNORMAL HIGH (ref 8–23)
CO2: 22 mmol/L (ref 22–32)
Calcium: 8.2 mg/dL — ABNORMAL LOW (ref 8.9–10.3)
Chloride: 99 mmol/L (ref 98–111)
Creatinine, Ser: 0.74 mg/dL (ref 0.61–1.24)
GFR calc Af Amer: >60 mL/min (ref >60)
GFR calc non Af Amer: >60 mL/min (ref >60)
Glucose, Bld: 81 mg/dL (ref 70–99)
Potassium: 5 mmol/L (ref 3.5–5.1)
Sodium: 132 mmol/L — ABNORMAL LOW (ref 135–145)
Total Bilirubin: 2.5 mg/dL — ABNORMAL HIGH (ref 0.3–1.2)
Total Protein: 6.7 g/dL (ref 6.5–8.1)

## 2019-07-27 LAB — I-STAT CHEM 8, ED
BUN: 35 mg/dL — ABNORMAL HIGH (ref 8–23)
Calcium, Ion: 1.11 mmol/L — ABNORMAL LOW (ref 1.15–1.40)
Chloride: 101 mmol/L (ref 98–111)
Creatinine, Ser: 0.7 mg/dL (ref 0.61–1.24)
Glucose, Bld: 84 mg/dL (ref 70–99)
HCT: 52 % (ref 39.0–52.0)
Hemoglobin: 17.7 g/dL — ABNORMAL HIGH (ref 13.0–17.0)
Potassium: 4.9 mmol/L (ref 3.5–5.1)
Sodium: 134 mmol/L — ABNORMAL LOW (ref 135–145)
TCO2: 26 mmol/L (ref 22–32)

## 2019-07-27 LAB — CBC WITH DIFFERENTIAL/PLATELET
Abs Immature Granulocytes: 0.14 10*3/uL — ABNORMAL HIGH (ref 0.00–0.07)
Basophils Absolute: 0.1 10*3/uL (ref 0.0–0.1)
Basophils Relative: 1 %
Eosinophils Absolute: 0.3 10*3/uL (ref 0.0–0.5)
Eosinophils Relative: 2 %
HCT: 52.4 % — ABNORMAL HIGH (ref 39.0–52.0)
Hemoglobin: 15.5 g/dL (ref 13.0–17.0)
Immature Granulocytes: 1 %
Lymphocytes Relative: 7 %
Lymphs Abs: 1 10*3/uL (ref 0.7–4.0)
MCH: 24.3 pg — ABNORMAL LOW (ref 26.0–34.0)
MCHC: 29.6 g/dL — ABNORMAL LOW (ref 30.0–36.0)
MCV: 82 fL (ref 80.0–100.0)
Monocytes Absolute: 2.1 10*3/uL — ABNORMAL HIGH (ref 0.1–1.0)
Monocytes Relative: 14 %
Neutro Abs: 11.3 10*3/uL — ABNORMAL HIGH (ref 1.7–7.7)
Neutrophils Relative %: 75 %
Platelets: 92 10*3/uL — ABNORMAL LOW (ref 150–400)
RBC: 6.39 MIL/uL — ABNORMAL HIGH (ref 4.22–5.81)
RDW: 23.7 % — ABNORMAL HIGH (ref 11.5–15.5)
WBC: 14.8 10*3/uL — ABNORMAL HIGH (ref 4.0–10.5)
nRBC: 0 % (ref 0.0–0.2)

## 2019-07-27 LAB — TROPONIN I (HIGH SENSITIVITY)
Troponin I (High Sensitivity): 7 ng/L (ref <18)
Troponin I (High Sensitivity): 6 ng/L (ref <18)

## 2019-07-27 LAB — PROTIME-INR
INR: 1.3 — ABNORMAL HIGH (ref 0.8–1.2)
Prothrombin Time: 16 seconds — ABNORMAL HIGH (ref 11.4–15.2)

## 2019-07-27 LAB — TYPE AND SCREEN
ABO/RH(D): B POS
Antibody Screen: NEGATIVE

## 2019-07-27 LAB — SARS CORONAVIRUS 2 BY RT PCR (HOSPITAL ORDER, PERFORMED IN ~~LOC~~ HOSPITAL LAB): SARS Coronavirus 2: NEGATIVE

## 2019-07-27 LAB — MRSA PCR SCREENING: MRSA by PCR: POSITIVE — AB

## 2019-07-27 MED ORDER — ADULT MULTIVITAMIN W/MINERALS CH
1.0000 | ORAL_TABLET | Freq: Every day | ORAL | Status: DC
  Administered 2019-07-27 – 2019-07-28 (×2): 1 via ORAL
  Filled 2019-07-27 (×2): qty 1

## 2019-07-27 MED ORDER — AMLODIPINE BESYLATE 10 MG PO TABS
10.0000 mg | ORAL_TABLET | Freq: Every day | ORAL | Status: DC
  Administered 2019-07-27 – 2019-07-29 (×3): 10 mg via ORAL
  Filled 2019-07-27 (×3): qty 1

## 2019-07-27 MED ORDER — TRANEXAMIC ACID FOR INHALATION
500.0000 mg | Freq: Three times a day (TID) | RESPIRATORY_TRACT | Status: AC
  Administered 2019-07-27 (×2): 500 mg via RESPIRATORY_TRACT
  Filled 2019-07-27 (×2): qty 10

## 2019-07-27 MED ORDER — CARVEDILOL 12.5 MG PO TABS
12.5000 mg | ORAL_TABLET | Freq: Two times a day (BID) | ORAL | Status: DC
  Administered 2019-07-27 – 2019-07-29 (×5): 12.5 mg via ORAL
  Filled 2019-07-27 (×5): qty 1

## 2019-07-27 MED ORDER — SODIUM CHLORIDE 0.9 % IV SOLN
2.0000 g | Freq: Three times a day (TID) | INTRAVENOUS | Status: DC
  Administered 2019-07-27 – 2019-07-28 (×4): 2 g via INTRAVENOUS
  Filled 2019-07-27 (×5): qty 2

## 2019-07-27 MED ORDER — MORPHINE SULFATE (PF) 4 MG/ML IV SOLN
4.0000 mg | Freq: Once | INTRAVENOUS | Status: AC
  Administered 2019-07-27: 4 mg via INTRAVENOUS
  Filled 2019-07-27: qty 1

## 2019-07-27 MED ORDER — SODIUM CHLORIDE (PF) 0.9 % IJ SOLN
INTRAMUSCULAR | Status: AC
  Filled 2019-07-27: qty 50

## 2019-07-27 MED ORDER — CHLORHEXIDINE GLUCONATE CLOTH 2 % EX PADS: 6.0000 | MEDICATED_PAD | Freq: Every day | CUTANEOUS | Status: DC

## 2019-07-27 MED ORDER — BENZONATATE 100 MG PO CAPS
100.0000 mg | ORAL_CAPSULE | Freq: Two times a day (BID) | ORAL | Status: DC | PRN
  Administered 2019-07-27: 100 mg via ORAL
  Filled 2019-07-27 (×2): qty 1

## 2019-07-27 MED ORDER — GUAIFENESIN-DM 100-10 MG/5ML PO SYRP
5.0000 mL | ORAL_SOLUTION | ORAL | Status: DC | PRN
  Administered 2019-07-28: 5 mL via ORAL
  Filled 2019-07-27: qty 10

## 2019-07-27 MED ORDER — ACETAMINOPHEN 500 MG PO TABS: 500.0000 mg | ORAL_TABLET | Freq: Four times a day (QID) | ORAL | Status: DC | PRN

## 2019-07-27 MED ORDER — VANCOMYCIN HCL IN DEXTROSE 1-5 GM/200ML-% IV SOLN
1000.0000 mg | Freq: Once | INTRAVENOUS | Status: AC
  Administered 2019-07-27: 1000 mg via INTRAVENOUS
  Filled 2019-07-27: qty 200

## 2019-07-27 MED ORDER — MUPIROCIN 2 % EX OINT
1.0000 "application " | TOPICAL_OINTMENT | Freq: Two times a day (BID) | CUTANEOUS | Status: DC
  Administered 2019-07-27: 1 via NASAL
  Filled 2019-07-27: qty 22

## 2019-07-27 MED ORDER — TRANEXAMIC ACID FOR EPISTAXIS
500.0000 mg | Freq: Once | TOPICAL | Status: AC
  Administered 2019-07-27: 500 mg via TOPICAL
  Filled 2019-07-27: qty 10

## 2019-07-27 MED ORDER — OXYCODONE HCL 5 MG PO TABS
10.0000 mg | ORAL_TABLET | ORAL | Status: DC | PRN
  Administered 2019-07-27 – 2019-07-29 (×7): 10 mg via ORAL
  Filled 2019-07-27 (×7): qty 2

## 2019-07-27 MED ORDER — GABAPENTIN 300 MG PO CAPS: 300.0000 mg | ORAL_CAPSULE | Freq: Every day | ORAL | Status: DC | PRN

## 2019-07-27 MED ORDER — SODIUM CHLORIDE 0.9 % IV SOLN
2.0000 g | INTRAVENOUS | Status: AC
  Administered 2019-07-27: 2 g via INTRAVENOUS
  Filled 2019-07-27: qty 2

## 2019-07-27 MED ORDER — LENVATINIB (12 MG DAILY DOSE) 3 X 4 MG PO CPPK: 12.0000 mg | ORAL_CAPSULE | Freq: Every day | ORAL | Status: DC

## 2019-07-27 MED ORDER — ONDANSETRON HCL 4 MG/2ML IJ SOLN
4.0000 mg | Freq: Once | INTRAMUSCULAR | Status: AC
  Administered 2019-07-27: 4 mg via INTRAVENOUS
  Filled 2019-07-27: qty 2

## 2019-07-27 MED ORDER — ALBUTEROL SULFATE (2.5 MG/3ML) 0.083% IN NEBU: 2.5000 mg | INHALATION_SOLUTION | Freq: Four times a day (QID) | RESPIRATORY_TRACT | Status: DC | PRN

## 2019-07-27 MED ORDER — VITAMIN D 25 MCG (1000 UNIT) PO TABS
1000.0000 [IU] | ORAL_TABLET | Freq: Every day | ORAL | Status: DC
  Administered 2019-07-27 – 2019-07-28 (×2): 1000 [IU] via ORAL
  Filled 2019-07-27 (×2): qty 1

## 2019-07-27 MED ORDER — VANCOMYCIN HCL IN DEXTROSE 1-5 GM/200ML-% IV SOLN
1000.0000 mg | Freq: Three times a day (TID) | INTRAVENOUS | Status: DC
  Administered 2019-07-27 – 2019-07-28 (×3): 1000 mg via INTRAVENOUS
  Filled 2019-07-27 (×3): qty 200

## 2019-07-27 MED ORDER — LEVALBUTEROL TARTRATE 45 MCG/ACT IN AERO: 1.0000 | INHALATION_SPRAY | Freq: Four times a day (QID) | RESPIRATORY_TRACT | Status: DC | PRN

## 2019-07-27 MED ORDER — ONDANSETRON HCL 4 MG PO TABS: 4.0000 mg | ORAL_TABLET | Freq: Four times a day (QID) | ORAL | Status: DC | PRN

## 2019-07-27 MED ORDER — ONDANSETRON HCL 4 MG/2ML IJ SOLN: 4.0000 mg | Freq: Four times a day (QID) | INTRAMUSCULAR | Status: DC | PRN

## 2019-07-27 MED ORDER — CYCLOBENZAPRINE HCL 10 MG PO TABS: 10.0000 mg | ORAL_TABLET | Freq: Three times a day (TID) | ORAL | Status: DC | PRN

## 2019-07-27 MED ORDER — IOHEXOL 350 MG/ML SOLN
100.0000 mL | Freq: Once | INTRAVENOUS | Status: AC | PRN
  Administered 2019-07-27: 100 mL via INTRAVENOUS

## 2019-07-27 MED ORDER — ENSURE ENLIVE PO LIQD
237.0000 mL | Freq: Two times a day (BID) | ORAL | Status: DC
  Administered 2019-07-27 – 2019-07-29 (×3): 237 mL via ORAL

## 2019-07-27 MED ORDER — SALINE SPRAY 0.65 % NA SOLN: 1.0000 | NASAL | Status: DC | PRN

## 2019-07-27 NOTE — Telephone Encounter (Signed)
Received call from patient's wife William Solis that he was brought back to the hospital from [facility] very early this morning coughing up blood. Admitted to the hospital.  Dr. Benay Spice was made aware.

## 2019-07-27 NOTE — Progress Notes (Signed)
Pt very hesitant about utilizing CPAP QHS due to coughing up blood.  Pt states he will notify RT if he decides he wants to wear.  RN aware, RT to monitor and assess as needed.

## 2019-07-27 NOTE — Progress Notes (Signed)
Manufacturing engineer [facility]) Hospice and Palliative Care   William Solis was approved for hospice services last week when he was admitted.  Family ultimately refused hospice and wanted to pursue continued treatment.  Our palliative services were requested to follow him at [facility].  He was brought back to the ED prior to services being established (he d/c yesterday and returned today).  Please let us know if we can be of any assistance.  Venia Carbon RN, BSN, [place] [facility] Liaison

## 2019-07-27 NOTE — ED Provider Notes (Signed)
Pt signed out by Dr. Randal Buba pending CT angio chest.    IMPRESSION:  Positive exam for bilateral segmental pulmonary emboli as above  without evidence of heart strain.    Progression of innumerable bilateral pulmonary metastases, increased  in both size and number.    Posterior right upper lobe subpleural patchy airspace  disease/consolidation may represent pneumonia, aspiration or  pulmonary hemorrhage.    Increased dependent bibasilar atelectasis.    Known bulky multifocal hepatic lesions compatible with  hepatocellular carcinoma. Small amount of associated upper abdominal  ascites.    Aortic Atherosclerosis (ICD10-I70.0).   Pt was on Xarelto for a PE that was diagnosed in April.  He was admitted from 6/28 to 7/6 for a SDH.  He required Kcentra for Xarelto reversal.    Pulmonology has been consulted.  Pt d/w Dr. Nicoletta Dress ([facility]) for admission.    Isla Pence, MD 07/27/19 [phone]

## 2019-07-27 NOTE — Consult Note (Signed)
NAME:  William Solis, MRN:  [id number], DOB:  07-23-1950, LOS: 0 ADMISSION DATE:  07/27/2019, CONSULTATION DATE: July 7 REFERRING MD: Dr. Gilford Raid, CHIEF COMPLAINT: Coughing up blood  Brief History   69 year old male with metastatic hepatocellular carcinoma admitted on July 27, 2019 in the setting of the sudden onset of hemoptysis and acute on chronic respiratory failure with hypoxemia.  History of present illness   This is an unfortunate 69 year old male who was just discharged from our hospital yesterday who returned today in the setting of hemoptysis and acute on chronic respiratory failure with hypoxemia. He was diagnosed with hepatocellular carcinoma in April 2021, it was noted to be metastatic.  He has been treated by Dr. Julieanne Manson.  Treatment regimen has included atezolizumab, bevacizumab-bvzr and levatinib.  This year he has been noted to have innumerable pulmonary nodules.  He has been diagnosed with a pulmonary embolism this year in April 2021 and was treated initially on Xarelto.  Unfortunately he had a fall recently and developed a subdural hematoma which was managed conservatively with Kcentra.  Serial CT scans showed improvement of the bleed and Xarelto has been held.  He says that over the last several weeks he has had a cough which is productive of clear to sometimes brown mucus.  He has been using oxygen at 2 to 3 L for the last several weeks as well.  He has noted epistaxis periodically which she attributes to a dry nose and his oxygen use.  However, it has been several weeks since he has experienced epistaxis.  Yesterday he was discharged from this hospital to a rehab facility with plans for further rehab and potentially cancer treatment to resume later.  He says that yesterday he did not have any change in his baseline cough or any new shortness of breath.  However, around 4:30 this morning while staying at [facility] he woke up coughing up dark blood.  He says that he did not  have epistaxis at the time he did not feel any blood in his throat.  He came to the emergency room where he was noted to be hypoxemic, he was given nebulized TXA.  He now feels a bit better and his breathing has stabilized somewhat.  He had a CT angiogram attempted in the ER but the contrast infiltrated into his right arm.  Pulmonary and critical care medicine was consulted for evaluation of his hemoptysis.  Past Medical History  ADHD Bipolar disorder Depression Hepatitis C Hypertension Heptocellular carcinoma  Significant Hospital Events   7/7 admission  Consults:  Pulmonary  Procedures:    Significant Diagnostic Tests:  7/7 CT angiogram chest > pending  Micro Data:    Antimicrobials:    Interim history/subjective:  As above  Objective   Blood pressure 136/81, pulse 79, temperature 98.1 F (36.7 C), resp. rate 19, SpO2 100 %.       No intake or output data in the 24 hours ending 07/27/19 0723 There were no vitals filed for this visit.  Examination:  General:  Chronically ill appearing, resting comfortably in bed HENT: NCAT OP clear PULM: Crackles bases B, normal effort CV: RRR, no mgr GI: BS+, mild tenderness  MSK: normal bulk and tone Neuro: awake, alert,  [facility] Problem list     Assessment & Plan:   Hemoptysis: presumably due to multiple pulmonary metastases, nothing by history to support epistaxis Hold anticoagulation Nebulized tranexamic acid again x2 more doses Agree with obtaining a  CT of the chest, though I don't think it needs to be a CT angiogram because we would not anticoagulate him again: may help [place] localize source of bleeding if it worsens again After discussing goals of care with him it doesn't seem that he would be interested in a bronchoscopy  Acute on chronic respiratory failure with hypoxemia:  Wean off O2 to maintain O2 saturation > 88%  Code status: DNR  Prognosis: poor  Best practice:   Recommend  hospitalist service to admit and palliative care consultation  Labs   CBC: Recent Labs  Lab 07/21/19 0431 07/27/19 0533 07/27/19 0637  WBC 12.7* 14.8*  --   NEUTROABS  --  11.3*  --   HGB 13.8 15.5 17.7*  HCT 45.3 52.4* 52.0  MCV 79.5* 82.0  --   PLT 97* 92*  --     Basic Metabolic Panel: Recent Labs  Lab 07/21/19 0431 07/27/19 0533 07/27/19 0637  NA 131* 132* 134*  K 4.7 5.0 4.9  CL 97* 99 101  CO2 19* 22  --   GLUCOSE 92 81 84  BUN 39* 35* 35*  CREATININE 0.78 0.74 0.70  CALCIUM 8.1* 8.2*  --   MG 2.3  --   --   PHOS 3.1  --   --    GFR: Estimated Creatinine Clearance: 99.7 mL/min (by C-G formula based on SCr of 0.7 mg/dL). Recent Labs  Lab 07/21/19 0431 07/27/19 0533  WBC 12.7* 14.8*    Liver Function Tests: Recent Labs  Lab 07/27/19 0533  AST 227*  ALT 203*  ALKPHOS 282*  BILITOT 2.5*  PROT 6.7  ALBUMIN 2.0*   No results for input(s): LIPASE, AMYLASE in the last 168 hours. No results for input(s): AMMONIA in the last 168 hours.  ABG    Component Value Date/Time   TCO2 26 07/27/2019 0637     Coagulation Profile: Recent Labs  Lab 07/27/19 0533  INR 1.3*    Cardiac Enzymes: No results for input(s): CKTOTAL, CKMB, CKMBINDEX, TROPONINI in the last 168 hours.  HbA1C: No results found for: HGBA1C  CBG: Recent Labs  Lab 07/24/19 2208  GLUCAP 116*    Review of Systems:   Gen: Denies fever, chills, weight change, fatigue, night sweats HEENT: Denies blurred vision, double vision, hearing loss, tinnitus, sinus congestion, rhinorrhea, sore throat, neck stiffness, dysphagia PULM: per HPI  CV: Denies chest pain, edema, orthopnea, paroxysmal nocturnal dyspnea, palpitations GI: Denies abdominal pain, nausea, vomiting, diarrhea, hematochezia, melena, constipation, change in bowel habits GU: Denies dysuria, hematuria, polyuria, oliguria, urethral discharge Endocrine: Denies hot or cold intolerance, polyuria, polyphagia or appetite  change Derm: Denies rash, dry skin, scaling or peeling skin change Heme: Denies easy bruising, bleeding, bleeding gums Neuro: Denies headache, numbness, weakness, slurred speech, loss of memory or consciousness   Past Medical History  He,  has a past medical history of ADHD, adult residual type, Anxiety, Arthritis, Bipolar disorder ([place]), Cutaneous abscess of left foot (04/09/2018), Depression, Hepatitis C, Hypertension, Insomnia, Left foot pain (04/09/2018), Muscle spasm, Neuropathy involving both lower extremities, Open wound of left foot (04/03/2018), Pneumonia, and Septic arthritis of left foot ([place]) (04/03/2018).   Surgical History    Past Surgical History:  Procedure Laterality Date  . AMPUTATION Left 10/21/2013   Procedure: LEFT FOOT SECOND RAY AMPUTATION ;  Surgeon: [name], MD;  Location: [place];  Service: Orthopedics;  Laterality: Left;  . AMPUTATION Left 04/10/2018   Procedure: LEFT TRANSMETATARSAL AMPUTATION;  Surgeon: Sharol Given,  Illene Regulus, MD;  Location: [place];  Service: Orthopedics;  Laterality: Left;  . BACK SURGERY  2006   Disc  . COLONOSCOPY    . PARATHYROIDECTOMY N/A 12/21/2013   Procedure: PARATHYROIDECTOMY;  Surgeon: Ralene Ok, MD;  Location: [place];  Service: General;  Laterality: N/A;  . SINUS EXPLORATION  2005/2007  . TEE WITHOUT CARDIOVERSION N/A 10/24/2013   Procedure: TRANSESOPHAGEAL ECHOCARDIOGRAM (TEE);  Surgeon: Fay Records, MD;  [facility] ENDOSCOPY;  Service: Cardiovascular;  Laterality: N/A;     Social History   reports that he quit smoking about 28 years ago. His smoking use included cigarettes and cigars. He quit after 21.00 years of use. He has never used smokeless tobacco. He reports that he does not drink alcohol and does not use drugs.   Family History   His family history includes Hypertension in his father and mother. There is no history of Diabetes type II.   Allergies No Known Allergies   Home Medications  Prior to Admission medications    Medication Sig Start Date End Date Taking? Authorizing Provider  acetaminophen (TYLENOL) 500 MG tablet Take 500 mg by mouth every 6 (six) hours as needed for mild pain or headache.    [provider]  albuterol (PROVENTIL) (2.5 MG/3ML) 0.083% nebulizer solution Take 3 mLs (2.5 mg total) by nebulization every 6 (six) hours as needed for wheezing or shortness of breath. 07/26/19   Terrilee Croak, MD  amLODipine (NORVASC) 10 MG tablet TAKE 1 TABLET EVERY DAY Patient taking differently: Take 10 mg by mouth daily.  04/14/19   Leamon Arnt, MD  ARNICA EX Apply 1 application topically 3 (three) times daily as needed (arthritis pain).    [provider]  Ascorbic Acid (VITAMIN C) 1000 MG tablet Take 500 mg by mouth daily.     [provider]  benzonatate (TESSALON) 100 MG capsule Take 1 capsule (100 mg total) by mouth 2 (two) times daily as needed for cough. 07/26/19   Terrilee Croak, MD  carvedilol (COREG) 12.5 MG tablet Take 1 tablet (12.5 mg total) by mouth 2 (two) times daily. 09/30/18   Leamon Arnt, MD  cholecalciferol (VITAMIN D3) 25 MCG (1000 UT) tablet Take 1,000 Units by mouth daily.    [provider]  Cyanocobalamin (VITAMIN B-12 PO) Take 1 tablet by mouth every 3 (three) days.    [provider]  cyclobenzaprine (FLEXERIL) 10 MG tablet Take 10 mg by mouth 3 (three) times daily as needed for muscle spasms.    [provider]  feeding supplement, ENSURE ENLIVE, (ENSURE ENLIVE) LIQD Take 237 mLs by mouth 2 (two) times daily between meals. 07/26/19   Terrilee Croak, MD  gabapentin (NEURONTIN) 300 MG capsule Take 1 capsule (300 mg total) by mouth daily as needed for up to 7 days (pain). 07/26/19 08/02/19  Dahal, Marlowe Aschoff, MD  guaiFENesin-dextromethorphan (ROBITUSSIN DM) 100-10 MG/5ML syrup Take 5 mLs by mouth every 4 (four) hours as needed for cough. 07/26/19   Terrilee Croak, MD  Lenvatinib 12 mg daily dose (LENVIMA, 12 MG DAILY DOSE,) 3 x 4 MG capsule Take  12 mg by mouth daily. 07/08/19   Ladell Pier, MD  levalbuterol [facility] HFA) 45 MCG/ACT inhaler Inhale 1 puff into the lungs every 6 (six) hours as needed for wheezing. 05/05/19   Owens Shark, NP  lidocaine (LIDODERM) 5 % Place 1 patch onto the skin daily as needed (pain). Pain. Remove & Discard patch within 12 hours  or as directed by MD    [provider]  Multiple Vitamins-Minerals (MULTIVITAMIN WITH MINERALS) tablet Take 1 tablet by mouth daily.    [provider]  Oxycodone HCl 10 MG TABS Take 1 tablet (10 mg total) by mouth every 4 (four) hours as needed for up to 3 days (pain). 07/26/19 07/29/19  Terrilee Croak, MD  prochlorperazine (COMPAZINE) 10 MG tablet Take 1 tablet (10 mg total) by mouth every 6 (six) hours as needed for nausea. 04/29/19   Ladell Pier, MD  sodium chloride (OCEAN) 0.65 % SOLN nasal spray Place 1 spray into both nostrils as needed for congestion.    [provider]  Tetrahydrozoline HCl (VISINE OP) Place 1 drop into both eyes 3 (three) times daily as needed (dry eyes).    [provider]  vitamin E (VITAMIN E) 180 MG (400 UNITS) capsule Take 400 Units by mouth daily.    [provider]     Critical care time: 35 minutes     Roselie Awkward, MD [place] [facility] Pager: [phone] Cell: [phone] If no response, call [phone]

## 2019-07-27 NOTE — ED Notes (Signed)
Report given to 4th floor RN.

## 2019-07-27 NOTE — ED Triage Notes (Signed)
Patient in from [facility] due to patient beginning to cough up blood and sob, has hx of lung cancer, initially on 2L [place] @ 87% switched to NRB and sats maintained greater than 97%, had negative TB test recently, EMS saw some bright red blood in route to ED.

## 2019-07-27 NOTE — ED Notes (Addendum)
Attempted to call report to 4th floor. Respiratory will do his nebulizer when he is upstairs.

## 2019-07-27 NOTE — Progress Notes (Signed)
Bilateral lower extremity venous duplex completed. Refer to "CV Proc" under chart review to view preliminary results.  07/27/2019 3:38 PM Kelby Aline., MHA, RVT, RDCS, RDMS

## 2019-07-27 NOTE — Progress Notes (Signed)
Pharmacy Antibiotic Note  William Solis is a 69 y.o. male admitted on 07/27/2019 with pneumonia.  Pharmacy has been consulted for vanc/cefepime dosing. Note patient just discharged yesterday 7/6 to [facility] started coughing up blood in middle of night and now is going to be readmitted to [facility].   Plan:  Vanc 1g x 1 given at 0800 and Cefepime 2g x 1 given at 0700  Start Vancomycin 1g IV q8 - goal trough 15-20  Start cefepime 2g IV q8  Daily SCr     Temp (24hrs), Avg:98.5 F (36.9 C), Min:98.1 F (36.7 C), Max:98.9 F (37.2 C)  Recent Labs  Lab 07/21/19 0431 07/27/19 0533 07/27/19 0637  WBC 12.7* 14.8*  --   CREATININE 0.78 0.74 0.70    Estimated Creatinine Clearance: 99.7 mL/min (by C-G formula based on SCr of 0.7 mg/dL).    No Known Allergies   Thank you for allowing pharmacy to be a part of this patient's care.  Kara Mead 07/27/2019 9:25 AM

## 2019-07-27 NOTE — ED Provider Notes (Signed)
[facility] DEPT Provider Note   CSN: [id number] Arrival date & time: 07/27/19  7829     History No chief complaint on file.   William Solis is a 69 y.o. male.  The history is provided by the patient, medical records and the EMS personnel. The history is limited by the condition of the patient.  Cough Cough characteristics:  Productive Sputum characteristics:  Bloody Severity:  Moderate Onset quality:  Sudden Timing:  Intermittent Progression:  Unchanged Chronicity:  New Context: not animal exposure and not sick contacts   Relieved by:  Nothing Worsened by:  Nothing Ineffective treatments:  None tried Associated symptoms: no chest pain, no chills, no diaphoresis, no ear fullness, no ear pain, no eye discharge, no fever, no headaches, no myalgias, no rash, no rhinorrhea, no shortness of breath, no sinus congestion, no sore throat, no weight loss and no wheezing   Risk factors: no chemical exposure   Patient with lung CA and hepatocellular carcinoma who presents hemoptysis.  No f/c/r.       Past Medical History:  Diagnosis Date  . ADHD, adult residual type    takes Ritalin daily  . Anxiety   . Arthritis   . Bipolar disorder ([place])    "a touch"  . Cutaneous abscess of left foot 04/09/2018  . Depression    takes Lexapro daily  . Hepatitis C    resolved with treatment 2019. released from hepatology  . Hypertension    takes Lisinopril,Metoprolol,and Amlodipine daily  . Insomnia    takes Melatonin nightly  . Left foot pain 04/09/2018  . Muscle spasm    takes Flexeril daily  . Neuropathy involving both lower extremities    takes Gabapentin daily  . Open wound of left foot 04/03/2018  . Pneumonia   . Septic arthritis of left foot ([place]) 04/03/2018    Patient Active Problem List   Diagnosis Date Noted  . Weakness generalized   . Malnutrition of moderate degree 07/20/2019  . Palliative care by specialist   . DNR (do not resuscitate)     . Failure to thrive in adult 07/19/2019  . SDH (subdural hematoma) ([place]) 07/18/2019  . Hepatocellular carcinoma ([place]) 04/25/2019  . Goals of care, counseling/discussion 04/25/2019  . Tremor 09/30/2018  . S/P transmetatarsal amputation of foot, left ([place]) 04/10/2018  . Osteomyelitis of left foot ([place]) 04/03/2018  . Postlaminectomy syndrome 04/01/2017  . Lumbar stenosis 03/09/2017  . Fibromyalgia 09/15/2016  . Risk for falls 06/25/2016  . OSA on CPAP 11/07/2015  . Bilateral edema of lower extremity 10/12/2015  . S/P removal of parathyroid gland ([place]) 12/21/2013  . History of hyperparathyroidism 11/22/2013  . History of osteomyelitis 10/25/2013  . Chronic low back pain 05/12/2012  . Narcotic dependence ([place]) 05/12/2012  . History of hepatitis C 04/19/2010  . Essential hypertension 04/19/2010  . Dry eye syndrome 11/20/2008  . MDD (major depressive disorder) 11/20/2008  . ADD (attention deficit disorder) 12/01/2006    Past Surgical History:  Procedure Laterality Date  . AMPUTATION Left 10/21/2013   Procedure: LEFT FOOT SECOND RAY AMPUTATION ;  Surgeon: [name], MD;  Location: [place] [facility];  Service: Orthopedics;  Laterality: Left;  . AMPUTATION Left 04/10/2018   Procedure: LEFT TRANSMETATARSAL AMPUTATION;  Surgeon: [name], MD;  Location: [facility];  Service: Orthopedics;  Laterality: Left;  . BACK SURGERY  2006   Disc  . COLONOSCOPY    . PARATHYROIDECTOMY N/A 12/21/2013   Procedure: PARATHYROIDECTOMY;  Surgeon: Ralene Ok, MD;  Location: [facility];  Service: General;  Laterality: N/A;  . SINUS EXPLORATION  2005/2007  . TEE WITHOUT CARDIOVERSION N/A 10/24/2013   Procedure: TRANSESOPHAGEAL ECHOCARDIOGRAM (TEE);  Surgeon: Fay Records, MD;  Location: [facility] ENDOSCOPY;  Service: Cardiovascular;  Laterality: N/A;       Family History  Problem Relation Age of Onset  . Hypertension Mother   . Hypertension Father   . Diabetes type II Neg Hx     Social History   Tobacco Use  .  Smoking status: Former Smoker    Years: 21.00    Types: Cigarettes, Cigars    Quit date: 10/20/1990    Years since quitting: 28.7  . Smokeless tobacco: Never Used  Vaping Use  . Vaping Use: Never used  Substance Use Topics  . Alcohol use: No    Alcohol/week: 0.0 standard drinks  . Drug use: No    Home Medications Prior to Admission medications   Medication Sig Start Date End Date Taking? Authorizing Provider  acetaminophen (TYLENOL) 500 MG tablet Take 500 mg by mouth every 6 (six) hours as needed for mild pain or headache.    [provider]  albuterol (PROVENTIL) (2.5 MG/3ML) 0.083% nebulizer solution Take 3 mLs (2.5 mg total) by nebulization every 6 (six) hours as needed for wheezing or shortness of breath. 07/26/19   Terrilee Croak, MD  amLODipine (NORVASC) 10 MG tablet TAKE 1 TABLET EVERY DAY Patient taking differently: Take 10 mg by mouth daily.  04/14/19   Leamon Arnt, MD  ARNICA EX Apply 1 application topically 3 (three) times daily as needed (arthritis pain).    [provider]  Ascorbic Acid (VITAMIN C) 1000 MG tablet Take 500 mg by mouth daily.     [provider]  benzonatate (TESSALON) 100 MG capsule Take 1 capsule (100 mg total) by mouth 2 (two) times daily as needed for cough. 07/26/19   Terrilee Croak, MD  carvedilol (COREG) 12.5 MG tablet Take 1 tablet (12.5 mg total) by mouth 2 (two) times daily. 09/30/18   Leamon Arnt, MD  cholecalciferol (VITAMIN D3) 25 MCG (1000 UT) tablet Take 1,000 Units by mouth daily.    [provider]  Cyanocobalamin (VITAMIN B-12 PO) Take 1 tablet by mouth every 3 (three) days.    [provider]  cyclobenzaprine (FLEXERIL) 10 MG tablet Take 10 mg by mouth 3 (three) times daily as needed for muscle spasms.    [provider]  feeding supplement, ENSURE ENLIVE, (ENSURE ENLIVE) LIQD Take 237 mLs by mouth 2 (two) times daily between meals. 07/26/19   Terrilee Croak, MD  gabapentin (NEURONTIN) 300  MG capsule Take 1 capsule (300 mg total) by mouth daily as needed for up to 7 days (pain). 07/26/19 08/02/19  Dahal, Marlowe Aschoff, MD  guaiFENesin-dextromethorphan (ROBITUSSIN DM) 100-10 MG/5ML syrup Take 5 mLs by mouth every 4 (four) hours as needed for cough. 07/26/19   Terrilee Croak, MD  Lenvatinib 12 mg daily dose (LENVIMA, 12 MG DAILY DOSE,) 3 x 4 MG capsule Take 12 mg by mouth daily. 07/08/19   Ladell Pier, MD  levalbuterol [facility] HFA) 45 MCG/ACT inhaler Inhale 1 puff into the lungs every 6 (six) hours as needed for wheezing. 05/05/19   Owens Shark, NP  lidocaine (LIDODERM) 5 % Place 1 patch onto the skin daily as needed (pain). Pain. Remove & Discard patch within 12 hours or as directed by MD    [provider]  Multiple Vitamins-Minerals (MULTIVITAMIN WITH MINERALS) tablet Take 1 tablet by mouth daily.    [provider]  Oxycodone HCl 10 MG TABS Take 1 tablet (10 mg total) by mouth every 4 (four) hours as needed for up to 3 days (pain). 07/26/19 07/29/19  Terrilee Croak, MD  prochlorperazine (COMPAZINE) 10 MG tablet Take 1 tablet (10 mg total) by mouth every 6 (six) hours as needed for nausea. 04/29/19   Ladell Pier, MD  sodium chloride (OCEAN) 0.65 % SOLN nasal spray Place 1 spray into both nostrils as needed for congestion.    [provider]  Tetrahydrozoline HCl (VISINE OP) Place 1 drop into both eyes 3 (three) times daily as needed (dry eyes).    [provider]  vitamin E (VITAMIN E) 180 MG (400 UNITS) capsule Take 400 Units by mouth daily.    [provider]    Allergies    Patient has no known allergies.  Review of Systems   Review of Systems  Constitutional: Negative for chills, diaphoresis, fever and weight loss.  HENT: Negative for ear pain, rhinorrhea and sore throat.   Eyes: Negative for discharge.  Respiratory: Positive for cough. Negative for shortness of breath and wheezing.   Cardiovascular: Negative for chest pain.    Gastrointestinal: Negative for abdominal pain.  Genitourinary: Negative for difficulty urinating.  Musculoskeletal: Negative for myalgias.  Skin: Negative for rash.  Neurological: Negative for headaches.  Psychiatric/Behavioral: Negative for agitation.  All other systems reviewed and are negative.   Physical Exam Updated Vital Signs BP 136/81   Pulse 79   Temp 98.1 F (36.7 C)   Resp 19   SpO2 100%   Physical Exam Vitals and nursing note reviewed.  Constitutional:      General: He is not in acute distress.    Appearance: Normal appearance.  HENT:     Head: Normocephalic and atraumatic.     Nose: Nose normal.  Eyes:     Conjunctiva/sclera: Conjunctivae normal.     Pupils: Pupils are equal, round, and reactive to light.  Cardiovascular:     Rate and Rhythm: Normal rate and regular rhythm.     Pulses: Normal pulses.     Heart sounds: Normal heart sounds.  Pulmonary:     Effort: Pulmonary effort is normal.     Breath sounds: Normal breath sounds.  Abdominal:     General: Abdomen is flat. Bowel sounds are normal.     Tenderness: There is no abdominal tenderness. There is no guarding or rebound.  Musculoskeletal:        General: Normal range of motion.     Cervical back: Normal range of motion and neck supple.  Skin:    General: Skin is warm and dry.     Capillary Refill: Capillary refill takes less than 2 seconds.  Neurological:     General: No focal deficit present.     Mental Status: He is alert and oriented to person, place, and time.     Deep Tendon Reflexes: Reflexes normal.  Psychiatric:        Mood and Affect: Mood normal.        Behavior: Behavior normal.     ED Results / Procedures / Treatments   Labs (all labs ordered are listed, but only abnormal results are displayed) Results for orders placed or performed during the hospital encounter of 07/27/19  CBC with Differential/Platelet  Result Value Ref Range   WBC 14.8 (H) 4.0 -  10.5 K/uL   RBC 6.39  (H) 4.22 - 5.81 MIL/uL   Hemoglobin 15.5 13.0 - 17.0 g/dL   HCT 52.4 (H) 39 - 52 %   MCV 82.0 80.0 - 100.0 fL   MCH 24.3 (L) 26.0 - 34.0 pg   MCHC 29.6 (L) 30.0 - 36.0 g/dL   RDW 23.7 (H) 11.5 - 15.5 %   Platelets 92 (L) 150 - 400 K/uL   nRBC 0.0 0.0 - 0.2 %   Neutrophils Relative % 75 %   Neutro Abs 11.3 (H) 1.7 - 7.7 K/uL   Lymphocytes Relative 7 %   Lymphs Abs 1.0 0.7 - 4.0 K/uL   Monocytes Relative 14 %   Monocytes Absolute 2.1 (H) 0 - 1 K/uL   Eosinophils Relative 2 %   Eosinophils Absolute 0.3 0 - 0 K/uL   Basophils Relative 1 %   Basophils Absolute 0.1 0 - 0 K/uL   Immature Granulocytes 1 %   Abs Immature Granulocytes 0.14 (H) 0.00 - 0.07 K/uL   Polychromasia PRESENT   Protime-INR  Result Value Ref Range   Prothrombin Time 16.0 (H) 11.4 - 15.2 seconds   INR 1.3 (H) 0.8 - 1.2  I-stat chem 8, ED (not at [facility] or [facility])  Result Value Ref Range   Sodium 134 (L) 135 - 145 mmol/L   Potassium 4.9 3.5 - 5.1 mmol/L   Chloride 101 98 - 111 mmol/L   BUN 35 (H) 8 - 23 mg/dL   Creatinine, Ser 0.70 0.61 - 1.24 mg/dL   Glucose, Bld 84 70 - 99 mg/dL   Calcium, Ion 1.11 (L) 1.15 - 1.40 mmol/L   TCO2 26 22 - 32 mmol/L   Hemoglobin 17.7 (H) 13.0 - 17.0 g/dL   HCT 52.0 39 - 52 %  Type and screen  Result Value Ref Range   ABO/RH(D) PENDING    Antibody Screen PENDING    Sample Expiration      07/30/2019,2359 Performed at [facility], [place] [address] [place], [place] [address]    CT HEAD WO CONTRAST  Result Date: 07/18/2019 CLINICAL DATA:  Subdural hematoma, follow-up EXAM: CT HEAD WITHOUT CONTRAST TECHNIQUE: Contiguous axial images were obtained from the base of the skull through the vertex without intravenous contrast. COMPARISON:  07/18/2019 FINDINGS: Brain: Small subdural hematoma along the posterior falx measuring 4 mm compared to 6 mm previously. Blood extends along the right side of the tentorium. No mass effect or midline shift. No new areas of hemorrhage.  Vascular: No hyperdense vessel or unexpected calcification. Skull: No acute calvarial abnormality. Sinuses/Orbits: Visualized paranasal sinuses and mastoids clear. Orbital soft tissues unremarkable. Other: None IMPRESSION: Small subdural hematoma along the posterior falx and right tentorium, slightly decreased in size since prior study. Electronically Signed   By: Rolm Baptise M.D.   On: 07/18/2019 15:11   CT HEAD WO CONTRAST  Result Date: 07/18/2019 CLINICAL DATA:  [place], hit head, anticoagulated, hepatocellular carcinoma EXAM: CT HEAD WITHOUT CONTRAST TECHNIQUE: Contiguous axial images were obtained from the base of the skull through the vertex without intravenous contrast. COMPARISON:  None. FINDINGS: Brain: There is a posterior falcine subdural hematoma measuring up to 6 mm. The subdural extends along the right side tentorium. No mass effect. No acute infarct. Lateral ventricles and remaining midline structures are unremarkable. Vascular: No hyperdense vessel or unexpected calcification. Skull: Normal. Negative for fracture or focal lesion. Sinuses/Orbits: Minimal fluid within the left maxillary sinus. Remaining paranasal sinuses are clear. Other: None.  IMPRESSION: 1. Small posterior falcine subdural hematoma extending along the right side tentorium. Maximal thickness measures 6 mm. No mass effect. These results were called by telephone at the time of interpretation on 07/18/2019 at 2:20 am to provider PA Quincy Carnes, who verbally acknowledged these results. Electronically Signed   By: Randa Ngo M.D.   On: 07/18/2019 02:21   CT Chest W Contrast  Result Date: 06/28/2019 CLINICAL DATA:  Restage multifocal HCC EXAM: CT CHEST, ABDOMEN, AND PELVIS WITH CONTRAST TECHNIQUE: Multidetector CT imaging of the chest, abdomen and pelvis was performed following the standard protocol during bolus administration of intravenous contrast. Multiphasic contrast enhanced imaging was performed of the abdomen CONTRAST:   126mL OMNIPAQUE IOHEXOL 300 MG/ML SOLN, additional oral enteric contrast COMPARISON:  05/02/2019 FINDINGS: CT CHEST FINDINGS Cardiovascular: Aortic atherosclerosis. Normal heart size. No pericardial effusion. Although this examination is not tailored for the evaluation of pulmonary embolism, there is some evidence of persistent embolus, particularly in the segmental arteries of the right upper lobe (series 7, image 40) and left lower lobe (series 7, image 59). Mediastinum/Nodes: Calcified right hilar and subcarinal lymph nodes. Thyroid gland, trachea, and esophagus demonstrate no significant findings. Lungs/Pleura: There are innumerable new and enlarged pulmonary nodules, an index nodule in the anterior right upper lobe measuring 1.1 cm, previously 0.7 cm (series 11, image 60). No pleural effusion or pneumothorax. Musculoskeletal: No chest wall mass or suspicious bone lesions identified. CT ABDOMEN PELVIS FINDINGS Hepatobiliary: Hepatomegaly and coarse contour of the liver. Slight interval enlargement of very bulky liver masses, dominant mass in the central right lobe of the liver measuring at least 16.5 x 13.4 cm, previously 15.8 x 12.6 cm when measured similarly (series 7, image 109). There are multiple new and enlarged much smaller lesions throughout the liver, for example in the liver dome a lesion measuring 1.1 cm, previously no greater than 0.7 cm (series 7, image 78). No gallstones, gallbladder wall thickening, or biliary dilatation. Pancreas: Unremarkable. No pancreatic ductal dilatation or surrounding inflammatory changes. Spleen: Mild splenomegaly, maximum coronal span 14.2 cm. Numerous splenic parenchymal calcifications in keeping with prior granulomatous infection. Adrenals/Urinary Tract: Adrenal glands are unremarkable. Kidneys are normal, without renal calculi, solid lesion, or hydronephrosis. Bladder is unremarkable. Stomach/Bowel: Stomach is within normal limits. Appendix is not clearly visualized.  No evidence of bowel wall thickening, distention, or inflammatory changes. Sigmoid diverticulosis. Vascular/Lymphatic: Aortic atherosclerosis. No enlarged abdominal or pelvic lymph nodes. Reproductive: No mass or other abnormality. Other: No abdominal wall hernia or abnormality. Trace ascites in the abdomen. Musculoskeletal: No acute or significant osseous findings. IMPRESSION: 1. Slight interval enlargement of very bulky liver masses. There are multiple new and enlarged much smaller lesions throughout the liver. Findings are consistent with worsened multifocal hepatocellular carcinoma. 2. There are innumerable new and enlarged pulmonary nodules. Findings are consistent with worsened pulmonary metastatic disease. 3. Stigmata of cirrhosis and portal hypertension. 4. Trace ascites in the abdomen. 5. Although this examination is not tailored for the evaluation of pulmonary embolism, there is some evidence of persistent embolus noted on CT angiogram dated 05/02/2019. No obvious new embolus appreciated. 6. Aortic Atherosclerosis (ICD10-I70.0). Electronically Signed   By: Eddie Candle M.D.   On: 06/28/2019 16:21   CT Abdomen Pelvis W Contrast  Result Date: 06/28/2019 CLINICAL DATA:  Restage multifocal HCC EXAM: CT CHEST, ABDOMEN, AND PELVIS WITH CONTRAST TECHNIQUE: Multidetector CT imaging of the chest, abdomen and pelvis was performed following the standard protocol during bolus administration of intravenous contrast. Multiphasic contrast  enhanced imaging was performed of the abdomen CONTRAST:  12mL OMNIPAQUE IOHEXOL 300 MG/ML SOLN, additional oral enteric contrast COMPARISON:  05/02/2019 FINDINGS: CT CHEST FINDINGS Cardiovascular: Aortic atherosclerosis. Normal heart size. No pericardial effusion. Although this examination is not tailored for the evaluation of pulmonary embolism, there is some evidence of persistent embolus, particularly in the segmental arteries of the right upper lobe (series 7, image 40) and left  lower lobe (series 7, image 59). Mediastinum/Nodes: Calcified right hilar and subcarinal lymph nodes. Thyroid gland, trachea, and esophagus demonstrate no significant findings. Lungs/Pleura: There are innumerable new and enlarged pulmonary nodules, an index nodule in the anterior right upper lobe measuring 1.1 cm, previously 0.7 cm (series 11, image 60). No pleural effusion or pneumothorax. Musculoskeletal: No chest wall mass or suspicious bone lesions identified. CT ABDOMEN PELVIS FINDINGS Hepatobiliary: Hepatomegaly and coarse contour of the liver. Slight interval enlargement of very bulky liver masses, dominant mass in the central right lobe of the liver measuring at least 16.5 x 13.4 cm, previously 15.8 x 12.6 cm when measured similarly (series 7, image 109). There are multiple new and enlarged much smaller lesions throughout the liver, for example in the liver dome a lesion measuring 1.1 cm, previously no greater than 0.7 cm (series 7, image 78). No gallstones, gallbladder wall thickening, or biliary dilatation. Pancreas: Unremarkable. No pancreatic ductal dilatation or surrounding inflammatory changes. Spleen: Mild splenomegaly, maximum coronal span 14.2 cm. Numerous splenic parenchymal calcifications in keeping with prior granulomatous infection. Adrenals/Urinary Tract: Adrenal glands are unremarkable. Kidneys are normal, without renal calculi, solid lesion, or hydronephrosis. Bladder is unremarkable. Stomach/Bowel: Stomach is within normal limits. Appendix is not clearly visualized. No evidence of bowel wall thickening, distention, or inflammatory changes. Sigmoid diverticulosis. Vascular/Lymphatic: Aortic atherosclerosis. No enlarged abdominal or pelvic lymph nodes. Reproductive: No mass or other abnormality. Other: No abdominal wall hernia or abnormality. Trace ascites in the abdomen. Musculoskeletal: No acute or significant osseous findings. IMPRESSION: 1. Slight interval enlargement of very bulky liver  masses. There are multiple new and enlarged much smaller lesions throughout the liver. Findings are consistent with worsened multifocal hepatocellular carcinoma. 2. There are innumerable new and enlarged pulmonary nodules. Findings are consistent with worsened pulmonary metastatic disease. 3. Stigmata of cirrhosis and portal hypertension. 4. Trace ascites in the abdomen. 5. Although this examination is not tailored for the evaluation of pulmonary embolism, there is some evidence of persistent embolus noted on CT angiogram dated 05/02/2019. No obvious new embolus appreciated. 6. Aortic Atherosclerosis (ICD10-I70.0). Electronically Signed   By: Eddie Candle M.D.   On: 06/28/2019 16:21   DG Chest Portable 1 View  Result Date: 07/27/2019 CLINICAL DATA:  Multifocal and metastatic hepatocellular carcinoma. Hemoptysis. EXAM: PORTABLE CHEST 1 VIEW COMPARISON:  05/02/2019 FINDINGS: Innumerable pulmonary nodules are scattered throughout the lungs. There is some hazy opacities in both lung basis on the right mid lung which could reflect superimposed multilobar pneumonia or pulmonary hemorrhage. Heart size within normal limits. Degenerative glenohumeral arthropathy on the left. IMPRESSION: 1. Innumerable pulmonary nodules compatible with metastatic disease. 2. Hazy opacities in the right mid lung, potentially from multilobar pneumonia or pulmonary hemorrhage. Electronically Signed   By: Van Clines M.D.   On: 07/27/2019 06:08    EKG  EKG Interpretation  Date/Time:  Wednesday July 27 2019 05:43:41 EDT Ventricular Rate:  79 PR Interval:    QRS Duration: 114 QT Interval:  400 QTC Calculation: 459 R Axis:   -73 Text Interpretation: Sinus rhythm Left anterior fascicular block Confirmed  by Veatrice Kells [phone]) on 07/27/2019 7:15:18 AM       Radiology DG Chest Portable 1 View  Result Date: 07/27/2019 CLINICAL DATA:  Multifocal and metastatic hepatocellular carcinoma. Hemoptysis. EXAM: PORTABLE CHEST 1  VIEW COMPARISON:  05/02/2019 FINDINGS: Innumerable pulmonary nodules are scattered throughout the lungs. There is some hazy opacities in both lung basis on the right mid lung which could reflect superimposed multilobar pneumonia or pulmonary hemorrhage. Heart size within normal limits. Degenerative glenohumeral arthropathy on the left. IMPRESSION: 1. Innumerable pulmonary nodules compatible with metastatic disease. 2. Hazy opacities in the right mid lung, potentially from multilobar pneumonia or pulmonary hemorrhage. Electronically Signed   By: Van Clines M.D.   On: 07/27/2019 06:08    Procedures Procedures (including critical care time)  Medications Ordered in ED Medications  vancomycin (VANCOCIN) IVPB 1000 mg/200 mL premix (has no administration in time range)  ceFEPIme (MAXIPIME) 2 g in sodium chloride 0.9 % 100 mL IVPB (has no administration in time range)  tranexamic acid (CYKLOKAPRON) 1000 MG/10ML topical solution 500 mg (500 mg Topical Given 07/27/19 1657)    ED Course  I have reviewed the triage vital signs and the nursing notes.  Pertinent labs & imaging results that were available during my care of the patient were reviewed by me and considered in my medical decision making (see chart for details).    645 case d/w Dr. Corinna Lines critical care to see patient in consult  Final Clinical Impression(s) / ED Diagnoses  Admit to medicine.  Signed out to Dr. Gilford Raid pending labs.     Jamarquis Crull, MD 07/27/19 9038

## 2019-07-27 NOTE — H&P (Signed)
History and Physical    ANTWION CARPENTER VQM:[id number] DOB: [birth year] DOA: 07/27/2019  PCP: Leamon Arnt, MD Patient coming from:  SNF  Chief Complaint: Hemoptysis  HPI: FINNIS COLEE is a 69 y.o. male with medical history significant of chronic hypoxic respiratory failure on 2-3 LNC at home, recent fall and SDU, PE off Xarelto, metastatic liver cancer with pulmonary metastases on lenvatinib, hepatitis C, HTN, ADHD anxiety, depression, bipolar disorder who presented with hemoptysis.  Patient was diagnosed with bilateral PE, and started on Xarelto in April 2021.  Patient was hospitalized between 6/28 and 07/26/2019 fall and SDU. His Xarelto was stopped. Patient was just discharged to SNF yesterday on 07/26/19.  Patient reports that he started to have sudden onset hemoptysis with bright red blood around 4:30 AM this morning.  He estimated that he had a total of 1/2 cup of bloody hemoptysis.  Associate symptoms included shortness of breath, cough and wheezing.  Denies fever or chills.  Denies chest pain, nausea, vomiting, diarrhea, abdominal pain, dysuria, urinary frequency urgency.  The EMS was called and patient was noted to have hypoxia 87% on 2 LNC.  He was placed on NRB in route to the ED today, in the emergency room, he was afebrile, pulse 79, RR 19, BP 136/81 and O2 sats 99% on 6LNC.  Labs showed sodium 132, BUN 35, creatinine 0.74, ALP 282, AST 227, ALT 203, WBC 14.8, stable hemoglobin 15.5, platelet 92, INR 1.3, glucose 81, negative troponins.  EKG showed NSR with LAFB with no ischemic changes. CTPA showed acute bilateral segmental pulmonary emboli.  Question of pulmonary meta stasis.  Posterior RUL consolidation.  Patient was started on vancomycin and cefepime.  Pulmonary was consulted.   Review of Systems: As per HPI otherwise 10 point review of systems negative.  Review of Systems Otherwise negative except as per HPI, including: General: Denies fever, chills, night sweats or  unintended weight loss. Resp: Positive for hemoptysis cough, wheezing, shortness of breath. Cardiac: Denies chest pain, palpitations, orthopnea, paroxysmal nocturnal dyspnea. GI: Denies abdominal pain, nausea, vomiting, diarrhea or constipation GU: Denies dysuria, frequency, hesitancy or incontinence MS: Denies muscle aches, joint pain or swelling Neuro: Denies headache, neurologic deficits (focal weakness, numbness, tingling), abnormal gait Psych: Denies anxiety, depression, SI/HI/AVH Skin: Denies new rashes or lesions ID: Denies sick contacts, exotic exposures, travel  Past Medical History:  Diagnosis Date  . ADHD, adult residual type    takes Ritalin daily  . Anxiety   . Arthritis   . Bipolar disorder ([place])    "a touch"  . Cutaneous abscess of left foot 04/09/2018  . Depression    takes Lexapro daily  . Hepatitis C    resolved with treatment 2019. released from hepatology  . Hypertension    takes Lisinopril,Metoprolol,and Amlodipine daily  . Insomnia    takes Melatonin nightly  . Left foot pain 04/09/2018  . Muscle spasm    takes Flexeril daily  . Neuropathy involving both lower extremities    takes Gabapentin daily  . Open wound of left foot 04/03/2018  . Pneumonia   . Septic arthritis of left foot ([place]) 04/03/2018    Past Surgical History:  Procedure Laterality Date  . AMPUTATION Left 10/21/2013   Procedure: LEFT FOOT SECOND RAY AMPUTATION ;  Surgeon: [name], MD;  Location: [facility];  Service: Orthopedics;  Laterality: Left;  . AMPUTATION Left 04/10/2018   Procedure: LEFT TRANSMETATARSAL AMPUTATION;  Surgeon: [name], MD;  Location: [facility];  Service: Orthopedics;  Laterality: Left;  . BACK SURGERY  2006   Disc  . COLONOSCOPY    . PARATHYROIDECTOMY N/A 12/21/2013   Procedure: PARATHYROIDECTOMY;  Surgeon: Ralene Ok, MD;  Location: [facility];  Service: General;  Laterality: N/A;  . SINUS EXPLORATION  2005/2007  . TEE WITHOUT CARDIOVERSION N/A 10/24/2013    Procedure: TRANSESOPHAGEAL ECHOCARDIOGRAM (TEE);  Surgeon: Fay Records, MD;  Location: [facility];  Service: Cardiovascular;  Laterality: N/A;    SOCIAL HISTORY:  reports that he quit smoking about 28 years ago. His smoking use included cigarettes and cigars. He quit after 21.00 years of use. He has never used smokeless tobacco. He reports that he does not drink alcohol and does not use drugs.  No Known Allergies  FAMILY HISTORY: Family History  Problem Relation Age of Onset  . Hypertension Mother   . Hypertension Father   . Diabetes type II Neg Hx      Prior to Admission medications   Medication Sig Start Date End Date Taking? Authorizing Provider  acetaminophen (TYLENOL) 500 MG tablet Take 500 mg by mouth every 6 (six) hours as needed for mild pain or headache.    [provider]  albuterol (PROVENTIL) (2.5 MG/3ML) 0.083% nebulizer solution Take 3 mLs (2.5 mg total) by nebulization every 6 (six) hours as needed for wheezing or shortness of breath. 07/26/19   Terrilee Croak, MD  amLODipine (NORVASC) 10 MG tablet TAKE 1 TABLET EVERY DAY Patient taking differently: Take 10 mg by mouth daily.  04/14/19   Leamon Arnt, MD  ARNICA EX Apply 1 application topically 3 (three) times daily as needed (arthritis pain).    [provider]  Ascorbic Acid (VITAMIN C) 1000 MG tablet Take 500 mg by mouth daily.     [provider]  benzonatate (TESSALON) 100 MG capsule Take 1 capsule (100 mg total) by mouth 2 (two) times daily as needed for cough. 07/26/19   Terrilee Croak, MD  carvedilol (COREG) 12.5 MG tablet Take 1 tablet (12.5 mg total) by mouth 2 (two) times daily. 09/30/18   Leamon Arnt, MD  cholecalciferol (VITAMIN D3) 25 MCG (1000 UT) tablet Take 1,000 Units by mouth daily.    [provider]  Cyanocobalamin (VITAMIN B-12 PO) Take 1 tablet by mouth every 3 (three) days.    [provider]  cyclobenzaprine (FLEXERIL) 10 MG tablet Take 10 mg by mouth  3 (three) times daily as needed for muscle spasms.    [provider]  feeding supplement, ENSURE ENLIVE, (ENSURE ENLIVE) LIQD Take 237 mLs by mouth 2 (two) times daily between meals. 07/26/19   Terrilee Croak, MD  gabapentin (NEURONTIN) 300 MG capsule Take 1 capsule (300 mg total) by mouth daily as needed for up to 7 days (pain). 07/26/19 08/02/19  Dahal, Marlowe Aschoff, MD  guaiFENesin-dextromethorphan (ROBITUSSIN DM) 100-10 MG/5ML syrup Take 5 mLs by mouth every 4 (four) hours as needed for cough. 07/26/19   Terrilee Croak, MD  Lenvatinib 12 mg daily dose (LENVIMA, 12 MG DAILY DOSE,) 3 x 4 MG capsule Take 12 mg by mouth daily. 07/08/19   Ladell Pier, MD  levalbuterol [facility] HFA) 45 MCG/ACT inhaler Inhale 1 puff into the lungs every 6 (six) hours as needed for wheezing. 05/05/19   Owens Shark, NP  lidocaine (LIDODERM) 5 % Place 1 patch onto the skin daily as needed (pain). Pain. Remove & Discard patch within 12 hours or as directed by MD  [provider]  Multiple Vitamins-Minerals (MULTIVITAMIN WITH MINERALS) tablet Take 1 tablet by mouth daily.    [provider]  Oxycodone HCl 10 MG TABS Take 1 tablet (10 mg total) by mouth every 4 (four) hours as needed for up to 3 days (pain). 07/26/19 07/29/19  Terrilee Croak, MD  prochlorperazine (COMPAZINE) 10 MG tablet Take 1 tablet (10 mg total) by mouth every 6 (six) hours as needed for nausea. 04/29/19   Ladell Pier, MD  sodium chloride (OCEAN) 0.65 % SOLN nasal spray Place 1 spray into both nostrils as needed for congestion.    [provider]  Tetrahydrozoline HCl (VISINE OP) Place 1 drop into both eyes 3 (three) times daily as needed (dry eyes).    [provider]  vitamin E (VITAMIN E) 180 MG (400 UNITS) capsule Take 400 Units by mouth daily.    [provider]    Physical Exam: Vitals:   07/27/19 0538 07/27/19 0802  BP: 136/81 (!) 141/81  Pulse: 79 82  Resp: 19 (!) 23  Temp: 98.1 F (36.7 C)     SpO2: 100% 99%      Constitutional: NAD, calm, comfortable.  Acute ill-appearing Eyes: PERRL, lids and conjunctivae normal ENMT: Mucous membranes are moist. Posterior pharynx clear of any exudate or lesions.Normal dentition.  Neck: normal, supple, no masses, no thyromegaly Respiratory: Bilateral sounds diminished no wheezing, no crackles. Normal respiratory effort. No accessory muscle use.  Cardiovascular: Regular rate and rhythm, no murmurs / rubs / gallops. B/L 1-2+ pitting extremity edema. 2+ pedal pulses. No carotid bruits.  Abdomen: no tenderness, no masses palpated. No hepatosplenomegaly. Bowel sounds positive.  Musculoskeletal: no clubbing / cyanosis. No joint deformity upper and lower extremities. Good ROM, no contractures. Normal muscle tone.  Skin: no rashes, lesions, ulcers. No induration Neurologic: CN 2-12 grossly intact. Sensation intact, DTR normal. Strength 5/5 in all 4.  Psychiatric: Normal judgment and insight. Alert and oriented x 3. Normal mood.     Labs on Admission: I have personally reviewed following labs and imaging studies  CBC: Recent Labs  Lab 07/21/19 0431 07/27/19 0533 07/27/19 0637  WBC 12.7* 14.8*  --   NEUTROABS  --  11.3*  --   HGB 13.8 15.5 17.7*  HCT 45.3 52.4* 52.0  MCV 79.5* 82.0  --   PLT 97* 92*  --    Basic Metabolic Panel: Recent Labs  Lab 07/21/19 0431 07/27/19 0533 07/27/19 0637  Neymar Dowe 131* 132* 134*  K 4.7 5.0 4.9  CL 97* 99 101  CO2 19* 22  --   GLUCOSE 92 81 84  BUN 39* 35* 35*  CREATININE 0.78 0.74 0.70  CALCIUM 8.1* 8.2*  --   MG 2.3  --   --   PHOS 3.1  --   --    GFR: Estimated Creatinine Clearance: 99.7 mL/min (by C-G formula based on SCr of 0.7 mg/dL). Liver Function Tests: Recent Labs  Lab 07/27/19 0533  AST 227*  ALT 203*  ALKPHOS 282*  BILITOT 2.5*  PROT 6.7  ALBUMIN 2.0*   No results for input(s): LIPASE, AMYLASE in the last 168 hours. No results for input(s): AMMONIA in the last 168  hours. Coagulation Profile: Recent Labs  Lab 07/27/19 0533  INR 1.3*   Cardiac Enzymes: No results for input(s): CKTOTAL, CKMB, CKMBINDEX, TROPONINI in the last 168 hours. BNP (last 3 results) No results for input(s): PROBNP in the last 8760 hours. HbA1C: No results for input(s): HGBA1C  in the last 72 hours. CBG: Recent Labs  Lab 07/24/19 2208  GLUCAP 116*   Lipid Profile: No results for input(s): CHOL, HDL, LDLCALC, TRIG, CHOLHDL, LDLDIRECT in the last 72 hours. Thyroid Function Tests: No results for input(s): TSH, T4TOTAL, FREET4, T3FREE, THYROIDAB in the last 72 hours. Anemia Panel: No results for input(s): VITAMINB12, FOLATE, FERRITIN, TIBC, IRON, RETICCTPCT in the last 72 hours. Urine analysis:    Component Value Date/Time   COLORURINE AMBER (A) 07/18/2019 0859   APPEARANCEUR CLEAR 07/18/2019 0859   LABSPEC 1.013 07/18/2019 0859   PHURINE 5.0 07/18/2019 0859   GLUCOSEU NEGATIVE 07/18/2019 0859   HGBUR SMALL (A) 07/18/2019 0859   BILIRUBINUR NEGATIVE 07/18/2019 0859   BILIRUBINUR Negative 06/04/2018 1435   KETONESUR NEGATIVE 07/18/2019 0859   PROTEINUR NEGATIVE 07/18/2019 0859   UROBILINOGEN 0.2 06/04/2018 1435   NITRITE NEGATIVE 07/18/2019 0859   LEUKOCYTESUR NEGATIVE 07/18/2019 0859   Sepsis Labs: !!!!!!!!!!!!!!!!!!!!!!!!!!!!!!!!!!!!!!!!!!!! @LABRCNTIP (procalcitonin:4,lacticidven:4) ) Recent Results (from the past 240 hour(s))  SARS Coronavirus 2 by RT PCR (hospital order, performed in [facility] hospital lab) Nasopharyngeal Nasopharyngeal Swab     Status: None   Collection Time: 07/18/19  4:14 AM   Specimen: Nasopharyngeal Swab  Result Value Ref Range Status   SARS Coronavirus 2 NEGATIVE NEGATIVE Final    Comment: (NOTE) SARS-CoV-2 target nucleic acids are NOT DETECTED.  The SARS-CoV-2 RNA is generally detectable in upper and lower respiratory specimens during the acute phase of infection. The lowest concentration of SARS-CoV-2 viral copies this assay  can detect is 250 copies / mL. A negative result does not preclude SARS-CoV-2 infection and should not be used as the sole basis for treatment or other patient management decisions.  A negative result may occur with improper specimen collection / handling, submission of specimen other than nasopharyngeal swab, presence of viral mutation(s) within the areas targeted by this assay, and inadequate number of viral copies (<250 copies / mL). A negative result must be combined with clinical observations, patient history, and epidemiological information.  Fact Sheet for Patients:   StrictlyIdeas.no  Fact Sheet for Healthcare Providers: BankingDealers.co.za  This test is not yet approved or  cleared by the [place] FDA and has been authorized for detection and/or diagnosis of SARS-CoV-2 by FDA under an Emergency Use Authorization (EUA).  This EUA will remain in effect (meaning this test can be used) for the duration of the COVID-19 declaration under Section 564(b)(1) of the Act, 21 U.S.C. section 360bbb-3(b)(1), unless the authorization is terminated or revoked sooner.  Performed at [facility], [place] [address], [place], [place] [address]   SARS Coronavirus 2 by RT PCR (hospital order, performed in [facility] hospital lab) Nasopharyngeal Nasopharyngeal Swab     Status: None   Collection Time: 07/27/19  6:45 AM   Specimen: Nasopharyngeal Swab  Result Value Ref Range Status   SARS Coronavirus 2 NEGATIVE NEGATIVE Final    Comment: (NOTE) SARS-CoV-2 target nucleic acids are NOT DETECTED.  The SARS-CoV-2 RNA is generally detectable in upper and lower respiratory specimens during the acute phase of infection. The lowest concentration of SARS-CoV-2 viral copies this assay can detect is 250 copies / mL. A negative result does not preclude SARS-CoV-2 infection and should not be used as the sole basis for treatment or  other patient management decisions.  A negative result may occur with improper specimen collection / handling, submission of specimen other than nasopharyngeal swab, presence of viral mutation(s) within the areas targeted by this  assay, and inadequate number of viral copies (<250 copies / mL). A negative result must be combined with clinical observations, patient history, and epidemiological information.  Fact Sheet for Patients:   StrictlyIdeas.no  Fact Sheet for Healthcare Providers: BankingDealers.co.za  This test is not yet approved or  cleared by the [place] FDA and has been authorized for detection and/or diagnosis of SARS-CoV-2 by FDA under an Emergency Use Authorization (EUA).  This EUA will remain in effect (meaning this test can be used) for the duration of the COVID-19 declaration under Section 564(b)(1) of the Act, 21 U.S.C. section 360bbb-3(b)(1), unless the authorization is terminated or revoked sooner.  Performed at [facility], [place] [address] [place], [place] [address]      Radiological Exams on Admission: CT Angio Chest PE W and/or Wo Contrast  Result Date: 07/27/2019 CLINICAL DATA:  Metastatic hepatocellular carcinoma, shortness of breath, hemoptysis, known pulmonary metastases EXAM: CT ANGIOGRAPHY CHEST WITH CONTRAST TECHNIQUE: Multidetector CT imaging of the chest was performed using the standard protocol during bolus administration of intravenous contrast. Multiplanar CT image reconstructions and MIPs were obtained to evaluate the vascular anatomy. CONTRAST:  148mL OMNIPAQUE IOHEXOL 350 MG/ML SOLN COMPARISON:  06/28/2019 FINDINGS: Cardiovascular: Branching hypodense filling defects noted in the pulmonary arteries extending into both upper lobes as well as the lingula and left lower lobe, compatible with acute bilateral pulmonary emboli. No significant central or saddle embolus. Pulmonary emboli  are predominately segmental in distribution. RV to LV ratio is 0.82.  Negative for CT evidence of heart strain. Intact thoracic aorta. Three vessel arch anatomy. No aneurysm or dissection. No mediastinal hemorrhage or hematoma. Normal heart size. Mitral valve calcifications present. No pericardial effusion. Mediastinum/Nodes: Thyroid, trachea and esophagus unremarkable. Trachea and central airways appear patent. Esophagus nondilated. No hiatal hernia. Calcified subcarinal and right infrahilar lymph nodes as before. Lungs/Pleura: Extensive innumerable bilateral pulmonary metastases throughout all lobes of both lungs. These metastases appear increased in both size and number. Index central right upper lobe lesion measures 1.8 cm, previously 1.6 cm, image 61 series 8. Posterior right upper lobe subpleural patchy airspace opacities as well as bibasilar dependent atelectasis. Basilar atelectasis has increased. Chronic calcified right lower lobe granuloma noted. No pleural effusion, additional pleural abnormality, or pneumothorax. Upper Abdomen: Ascites noted in the upper abdomen as well as extensive known bulky multifocal hepatic masses compatible with hepatocellular carcinoma. Liver is only partially imaged by chest CTA. Splenic punctate granulomata also noted. Musculoskeletal: No acute or significant osseous finding. No compression fracture. Sternum intact. Review of the MIP images confirms the above findings. IMPRESSION: Positive exam for bilateral segmental pulmonary emboli as above without evidence of heart strain. Progression of innumerable bilateral pulmonary metastases, increased in both size and number. Posterior right upper lobe subpleural patchy airspace disease/consolidation may represent pneumonia, aspiration or pulmonary hemorrhage. Increased dependent bibasilar atelectasis. Known bulky multifocal hepatic lesions compatible with hepatocellular carcinoma. Small amount of associated upper abdominal ascites.  Aortic Atherosclerosis (ICD10-I70.0). Electronically Signed   By: Jerilynn Mages.  Shick M.D.   On: 07/27/2019 08:24   DG Chest Portable 1 View  Result Date: 07/27/2019 CLINICAL DATA:  Multifocal and metastatic hepatocellular carcinoma. Hemoptysis. EXAM: PORTABLE CHEST 1 VIEW COMPARISON:  05/02/2019 FINDINGS: Innumerable pulmonary nodules are scattered throughout the lungs. There is some hazy opacities in both lung basis on the right mid lung which could reflect superimposed multilobar pneumonia or pulmonary hemorrhage. Heart size within normal limits. Degenerative glenohumeral arthropathy on the left. IMPRESSION: 1. Innumerable pulmonary nodules  compatible with metastatic disease. 2. Hazy opacities in the right mid lung, potentially from multilobar pneumonia or pulmonary hemorrhage. Electronically Signed   By: Van Clines M.D.   On: 07/27/2019 06:08     All images have been reviewed by me personally.  EKG: Independently reviewed.   Assessment/Plan Principal Problem:   Acute on chronic respiratory failure with hypoxia (HCC) Active Problems:   ADD (attention deficit disorder)   Bilateral edema of lower extremity   History of hepatitis C   Chronic low back pain   Essential hypertension   MDD (major depressive disorder)   OSA on CPAP   Hepatocellular carcinoma (HCC)   SDH (subdural hematoma) (HCC)   DNR (do not resuscitate)   Hemoptysis   Bilateral pulmonary embolism ([place])   Cancer with pulmonary metastases (HCC)   Bipolar 1 disorder (HCC)   Elevated LFTs   Assessment  plan  #Acute on chronic hypoxic respiratory failure #Acute hemoptysis #Chronic hypoxic respiratory failure on home O2 2-3 LNC  CTPA showed posterior RUL opacity  Patient presented with acute chronic hypoxic respiratory failure and acute hemoptysis.  CTPA showed acute bilateral pulmonary emboli, question of pulmonary meta stasis and posterior RUL opacity.  He currently on O2 5 LNC.  The increased O2 demand could be  multifactorial including acute hemoptysis/HCAP, progression of bilateral pulmonary metastasis, possible acute on chronic bilateral segmental PE  -Admit to telemetry -Follow-up on HH -O2 supplement on 5 LNC at ED now -Neb treatment as needed -Antitussin as needed -Infectious work-up pending -Empiric antibiotics for HCAP-vancomycin and cefepime -MRSA PCR pending -Pulmonary consult is appreciated.  #Possible acute on chronic bilateral pulmonary emboli  CTPA on 05/02/1219- Segmental pulmonary emboli within the right upper and left lower Lobes. CT chest on 06/28/2019-no change in PE  CTPA on 07/27/2019 Branching hypodense filling defects noted in the pulmonary arteries extending into both upper lobes as well as the lingula and left lower lobe, compatible with acute bilateral pulmonary emboli.  - CT PA showed B/L segmental PE.  I have discussed with radiologist over the phone.  It seemed that there is slightly worsening of PE on RUL with baseline unchanged PEs at other segmental lobes  - He is NOT a candidate for anticoagulant - will obtain LE doppler to eval DVT. If positive for DVT, may consider IVC filter.  - overall prognosis is poor given metastatic liver cancer with progression of pulmonary meta stasis.   #Metastatic liver cancer with bilateral pulmonary metastasis #Hepatitis C  -Follow-up with oncology as outpatient  #Recent fall and admission for SDU  -He was just discharged yesterday on 07/26/2019 after the hospitalization for SDU.  No new neuro deficit or new fall, will not repeat head CT for now   #HTN  -Chronic and at baseline  #Anxiety, depression, bipolar disorder -chronic and at baseline     There is no height or weight on file to calculate BMI.    DVT prophylaxis: SCD Code Status: DNR/DNI Family Communication: wife at bedside Consults called: pulmonary Admission status: inpatient tele  Status is: Inpatient  Remains inpatient appropriate because:Ongoing diagnostic  testing needed not appropriate for outpatient work up   Dispo: The patient is from: SNF              Anticipated d/c is to: SNF              Anticipated d/c date is: 2 days              Patient currently  is not medically stable to d/c.    Time Spent: 65 minutes.  >50% of the time was devoted to discussing the patients care, assessment, plan and disposition with other care givers along with counseling the patient about the risks and benefits of treatment.    Charlann Lange MD [facility]  If 7PM-7AM, please contact night-coverage   07/27/2019, 10:09 AM

## 2019-07-28 ENCOUNTER — Inpatient Hospital Stay: Payer: Medicare HMO

## 2019-07-28 ENCOUNTER — Inpatient Hospital Stay: Payer: Medicare HMO | Admitting: Nurse Practitioner

## 2019-07-28 DIAGNOSIS — I2699 Other pulmonary embolism without acute cor pulmonale: Secondary | ICD-10-CM

## 2019-07-28 DIAGNOSIS — R600 Localized edema: Secondary | ICD-10-CM

## 2019-07-28 LAB — COMPREHENSIVE METABOLIC PANEL
ALT: 180 U/L — ABNORMAL HIGH (ref 0–44)
AST: 175 U/L — ABNORMAL HIGH (ref 15–41)
Albumin: 1.9 g/dL — ABNORMAL LOW (ref 3.5–5.0)
Alkaline Phosphatase: 239 U/L — ABNORMAL HIGH (ref 38–126)
Anion gap: 8 (ref 5–15)
BUN: 28 mg/dL — ABNORMAL HIGH (ref 8–23)
CO2: 24 mmol/L (ref 22–32)
Calcium: 8.1 mg/dL — ABNORMAL LOW (ref 8.9–10.3)
Chloride: 102 mmol/L (ref 98–111)
Creatinine, Ser: 0.74 mg/dL (ref 0.61–1.24)
GFR calc Af Amer: >60 mL/min (ref >60)
GFR calc non Af Amer: >60 mL/min (ref >60)
Glucose, Bld: 96 mg/dL (ref 70–99)
Potassium: 5.2 mmol/L — ABNORMAL HIGH (ref 3.5–5.1)
Sodium: 134 mmol/L — ABNORMAL LOW (ref 135–145)
Total Bilirubin: 2 mg/dL — ABNORMAL HIGH (ref 0.3–1.2)
Total Protein: 6.4 g/dL — ABNORMAL LOW (ref 6.5–8.1)

## 2019-07-28 LAB — CBC
HCT: 48.7 % (ref 39.0–52.0)
Hemoglobin: 14.2 g/dL (ref 13.0–17.0)
MCH: 24 pg — ABNORMAL LOW (ref 26.0–34.0)
MCHC: 29.2 g/dL — ABNORMAL LOW (ref 30.0–36.0)
MCV: 82.4 fL (ref 80.0–100.0)
Platelets: 106 10*3/uL — ABNORMAL LOW (ref 150–400)
RBC: 5.91 MIL/uL — ABNORMAL HIGH (ref 4.22–5.81)
RDW: 23.5 % — ABNORMAL HIGH (ref 11.5–15.5)
WBC: 12.7 10*3/uL — ABNORMAL HIGH (ref 4.0–10.5)
nRBC: 0 % (ref 0.0–0.2)

## 2019-07-28 LAB — PROTIME-INR
INR: 1.3 — ABNORMAL HIGH (ref 0.8–1.2)
Prothrombin Time: 15.7 seconds — ABNORMAL HIGH (ref 11.4–15.2)

## 2019-07-28 MED ORDER — ACETAMINOPHEN 325 MG PO TABS: 650.0000 mg | ORAL_TABLET | ORAL | Status: DC | PRN

## 2019-07-28 MED ORDER — CHLORHEXIDINE GLUCONATE CLOTH 2 % EX PADS: 6.0000 | MEDICATED_PAD | Freq: Every day | CUTANEOUS | Status: DC

## 2019-07-28 MED ORDER — DEXAMETHASONE SODIUM PHOSPHATE 4 MG/ML IJ SOLN
8.0000 mg | Freq: Two times a day (BID) | INTRAMUSCULAR | Status: DC
  Administered 2019-07-28 – 2019-07-29 (×3): 8 mg via INTRAVENOUS
  Filled 2019-07-28 (×3): qty 2

## 2019-07-28 NOTE — Assessment & Plan Note (Signed)
-   see [place]

## 2019-07-28 NOTE — Assessment & Plan Note (Signed)
-   continue oxycodone; if needed will trial other pain relief

## 2019-07-28 NOTE — Assessment & Plan Note (Signed)
-   oxycodone for pain control and comfort; further pain control as needed

## 2019-07-28 NOTE — Assessment & Plan Note (Signed)
-   discharging to hospice

## 2019-07-28 NOTE — Assessment & Plan Note (Signed)
-   multifactorial due to malnutrition, steroid use, and progressive physical decline  - supportive care as now transitioning to hospice/comfort

## 2019-07-28 NOTE — Assessment & Plan Note (Signed)
-   responded some to TXA neb - due to underlying PE and likely to recur

## 2019-07-28 NOTE — Progress Notes (Signed)
Patient telemetry d/c per Dr. Sabino Gasser. Tele box and leads were heavy per patient and causing pain. Patient says that having them off makes him feel better. Call bell within reach. Pain is controlled at this time and assisted patient with positioning q2 hours.

## 2019-07-28 NOTE — Assessment & Plan Note (Signed)
-   anticoagulation remains on hold from previous hospitalization, plus transitioning to hospice

## 2019-07-28 NOTE — Progress Notes (Signed)
Patient care resumed by this nurse, patient is comfortable and repositioned at this time.

## 2019-07-28 NOTE — Assessment & Plan Note (Signed)
-   stage IV, now with progressively worsening disease process and patient likely approaching end of life - evaluated by oncology and pulmonology and in agreement with pursuing hospice/end of life/comfort care - continue pain control; patient prefers oxy over morphine/dilaudid at this time; will treat as needed - continue oxycodone - await placement to [facility] place and will discharge when bed available

## 2019-07-28 NOTE — Progress Notes (Signed)
PROGRESS NOTE    William Solis   LEX:[id number]  DOB: Nov 13, 1950  DOA: 07/27/2019     1  PCP: Leamon Arnt, MD  CC: coughing blood  Hospital Course: William Solis is a 69 yo CM with current history of stage IV HCC with mets to liver and now with bilateral Pes and hemoptysis. He has had overall progressive decline in his health and worsening quality of life. He was hospitalized 6/28-7/6 due to a fall that resulted in a SDH and was taken off anticoagulation. He was discharged home and presented back the following day on 07/27/19 with his hemoptysis. He has disease progression on repeat CT scans and has not responded much to recent trial of chemo with oncology.  He was evaluated by oncology and pulmonology on admission as well. After extensive bedside discussions with patient and his wife, they have elected to discharge to residential hospice at [facility] place for ongoing hospice care. He was offered morphine/dilaudid infusions and declined stating that oxycodone seems to help most.    Interval History:  Admitted yesterday after having just been discharged. He is now showing signs of further physical deterioration and warrants pursuing hospice at this time. Patient and wife are in agreement and he is planning on discharging to [facility].   Old records reviewed in assessment of this patient  ROS: Constitutional: positive for anorexia, fatigue and malaise, Respiratory: positive for cough, Cardiovascular: negative for chest pain and Gastrointestinal: negative for abdominal pain  Assessment & Plan: Bilateral edema of lower extremity - multifactorial due to malnutrition, steroid use, and progressive physical decline  - supportive care as now transitioning to hospice/comfort  Chronic low back pain - continue oxycodone; if needed will trial other pain relief   OSA on CPAP - continue qhs CPAP if patient wishes  Hepatocellular carcinoma ([place]) - stage IV, now with progressively worsening  disease process and patient likely approaching end of life - evaluated by oncology and pulmonology and in agreement with pursuing hospice/end of life/comfort care - continue pain control; patient prefers oxy over morphine/dilaudid at this time; will treat as needed - continue oxycodone - await placement to [facility] place and will discharge when bed available   SDH (subdural hematoma) (HCC) - anticoagulation remains on hold from previous hospitalization, plus transitioning to hospice   DNR (do not resuscitate) - discharging to hospice   Acute on chronic respiratory failure with hypoxia ([place]) - continue O2 for comfort  Hemoptysis - responded some to TXA neb - due to underlying PE and likely to recur  Bilateral pulmonary embolism (HCC) - oxycodone for pain control and comfort; further pain control as needed  Cancer with pulmonary metastases ([place]) - see HCC   Antimicrobials: discontinued  DVT prophylaxis: SCD Code Status: DNR Family Communication: wife Disposition Plan:  . Patient came from: home        . Barriers to d/c OR conditions which need to be met to effect a safe d/c: none . The current disposition plan is discharge to: [facility]   Objective: Vitals:   07/28/19 0400 07/28/19 0439 07/28/19 0500 07/28/19 1319  BP:  129/78  127/75  Pulse:  75  79  Resp: (!) '23 18 18 20  '$ Temp:  97.6 F (36.4 C)  97.8 F (36.6 C)  TempSrc:  Oral  Oral  SpO2:  96%  91%  Weight:      Height:        Intake/Output Summary (Last 24 hours) at 07/28/2019  [place] filed at 07/28/2019 1355 [place] per 24 hour  Intake 662.63 ml  Output 725 ml  Net -62.37 ml   Filed Weights   07/27/19 1513  Weight: 86 kg    Examination: General appearance: Chronically ill appearing man lying in bed very lethargic Head: Normocephalic, without obvious abnormality Eyes: EOMI Lungs: Scattered coarse breath sounds bilaterally Heart: regular rate and rhythm and S1, S2 normal Abdomen:  Tender to palpation throughout with mild guarding.  Bowel sounds present Extremities: 3+ bilateral lower extremity pitting edema up to mid thighs Skin: mobility and turgor normal Neurologic: Grossly normal   Consultants:   Oncology  Pulmonology  Procedures:   None  Data Reviewed: I have personally reviewed following labs and imaging studies Results for orders placed or performed during the hospital encounter of 07/27/19 (from the past 24 hour(s))  Comprehensive metabolic panel     Status: Abnormal   Collection Time: 07/28/19  5:59 AM  Result Value Ref Range   Sodium 134 (L) 135 - 145 mmol/L   Potassium 5.2 (H) 3.5 - 5.1 mmol/L   Chloride 102 98 - 111 mmol/L   CO2 24 22 - 32 mmol/L   Glucose, Bld 96 70 - 99 mg/dL   BUN 28 (H) 8 - 23 mg/dL   Creatinine, Ser 0.74 0.61 - 1.24 mg/dL   Calcium 8.1 (L) 8.9 - 10.3 mg/dL   Total Protein 6.4 (L) 6.5 - 8.1 g/dL   Albumin 1.9 (L) 3.5 - 5.0 g/dL   AST 175 (H) 15 - 41 U/L   ALT 180 (H) 0 - 44 U/L   Alkaline Phosphatase 239 (H) 38 - 126 U/L   Total Bilirubin 2.0 (H) 0.3 - 1.2 mg/dL   GFR calc non Af Amer >60 >60 mL/min   GFR calc Af Amer >60 >60 mL/min   Anion gap 8 5 - 15  CBC     Status: Abnormal   Collection Time: 07/28/19  5:59 AM  Result Value Ref Range   WBC 12.7 (H) 4.0 - 10.5 K/uL   RBC 5.91 (H) 4.22 - 5.81 MIL/uL   Hemoglobin 14.2 13.0 - 17.0 g/dL   HCT 48.7 39 - 52 %   MCV 82.4 80.0 - 100.0 fL   MCH 24.0 (L) 26.0 - 34.0 pg   MCHC 29.2 (L) 30.0 - 36.0 g/dL   RDW 23.5 (H) 11.5 - 15.5 %   Platelets 106 (L) 150 - 400 K/uL   nRBC 0.0 0.0 - 0.2 %  Protime-INR     Status: Abnormal   Collection Time: 07/28/19  5:59 AM  Result Value Ref Range   Prothrombin Time 15.7 (H) 11.4 - 15.2 seconds   INR 1.3 (H) 0.8 - 1.2    Recent Results (from the past 240 hour(s))  SARS Coronavirus 2 by RT PCR (hospital order, performed in [place] hospital lab) Nasopharyngeal Nasopharyngeal Swab     Status: None   Collection Time: 07/27/19   6:45 AM   Specimen: Nasopharyngeal Swab  Result Value Ref Range Status   SARS Coronavirus 2 NEGATIVE NEGATIVE Final    Comment: (NOTE) SARS-CoV-2 target nucleic acids are NOT DETECTED.  The SARS-CoV-2 RNA is generally detectable in upper and lower respiratory specimens during the acute phase of infection. The lowest concentration of SARS-CoV-2 viral copies this assay can detect is 250 copies / mL. A negative result does not preclude SARS-CoV-2 infection and should not be used as the sole basis for treatment or other patient  management decisions.  A negative result may occur with improper specimen collection / handling, submission of specimen other than nasopharyngeal swab, presence of viral mutation(s) within the areas targeted by this assay, and inadequate number of viral copies (<250 copies / mL). A negative result must be combined with clinical observations, patient history, and epidemiological information.  Fact Sheet for Patients:   StrictlyIdeas.no  Fact Sheet for Healthcare Providers: BankingDealers.co.za  This test is not yet approved or  cleared by the [place] FDA and has been authorized for detection and/or diagnosis of SARS-CoV-2 by FDA under an Emergency Use Authorization (EUA).  This EUA will remain in effect (meaning this test can be used) for the duration of the COVID-19 declaration under Section 564(b)(1) of the Act, 21 U.S.C. section 360bbb-3(b)(1), unless the authorization is terminated or revoked sooner.  Performed at [facility], [place] [address] [place], [place] [address]   Culture, blood (Routine X 2) w Reflex to ID Panel     Status: None (Preliminary result)   Collection Time: 07/27/19 11:24 AM   Specimen: BLOOD RIGHT HAND  Result Value Ref Range Status   Specimen Description   Final    BLOOD RIGHT HAND Performed at [facility] [address] [place],  [place] [address]    Special Requests   Final    BOTTLES DRAWN AEROBIC ONLY Blood Culture adequate volume Performed at [facility] [address] [place], [place] [address]    Culture   Final    NO GROWTH < 24 HOURS Performed at [facility] [address] [place], [place] [address]    Report Status PENDING  Incomplete  Culture, blood (Routine X 2) w Reflex to ID Panel     Status: None (Preliminary result)   Collection Time: 07/27/19 11:29 AM   Specimen: BLOOD LEFT HAND  Result Value Ref Range Status   Specimen Description   Final    BLOOD LEFT HAND Performed at [facility] [address] [place], [place] [address]    Special Requests   Final    BOTTLES DRAWN AEROBIC AND ANAEROBIC Blood Culture adequate volume Performed at [facility] [address] [place], [place] [address]    Culture   Final    NO GROWTH < 24 HOURS Performed at [facility] [address] [place], [place] [address]    Report Status PENDING  Incomplete  MRSA PCR Screening     Status: Abnormal   Collection Time: 07/27/19 11:58 AM   Specimen: Nasal Mucosa; Nasopharyngeal  Result Value Ref Range Status   MRSA by PCR POSITIVE (A) NEGATIVE Final    Comment:        The GeneXpert MRSA Assay (FDA approved for NASAL specimens only), is one component of a comprehensive MRSA colonization surveillance program. It is not intended to diagnose MRSA infection nor to guide or monitor treatment for MRSA infections. RESULT CALLED TO, READ BACK BY AND VERIFIED WITH: J TAYLOR AT 1628 ON 07/27/2019 BY MOSLEY,J Performed at [facility], [place] [address], [place], [place] [address]      Radiology Studies: CT Angio Chest PE W and/or Wo Contrast  Result Date: 07/27/2019 CLINICAL DATA:  Metastatic hepatocellular carcinoma, shortness of breath, hemoptysis, known pulmonary metastases EXAM: CT ANGIOGRAPHY CHEST WITH CONTRAST TECHNIQUE:  Multidetector CT imaging of the chest was performed using the standard protocol during bolus administration of intravenous contrast. Multiplanar CT image  reconstructions and MIPs were obtained to evaluate the vascular anatomy. CONTRAST:  144m OMNIPAQUE IOHEXOL 350 MG/ML SOLN COMPARISON:  06/28/2019 FINDINGS: Cardiovascular: Branching hypodense filling defects noted in the pulmonary arteries extending into both upper lobes as well as the lingula and left lower lobe, compatible with acute bilateral pulmonary emboli. No significant central or saddle embolus. Pulmonary emboli are predominately segmental in distribution. RV to LV ratio is 0.82.  Negative for CT evidence of heart strain. Intact thoracic aorta. Three vessel arch anatomy. No aneurysm or dissection. No mediastinal hemorrhage or hematoma. Normal heart size. Mitral valve calcifications present. No pericardial effusion. Mediastinum/Nodes: Thyroid, trachea and esophagus unremarkable. Trachea and central airways appear patent. Esophagus nondilated. No hiatal hernia. Calcified subcarinal and right infrahilar lymph nodes as before. Lungs/Pleura: Extensive innumerable bilateral pulmonary metastases throughout all lobes of both lungs. These metastases appear increased in both size and number. Index central right upper lobe lesion measures 1.8 cm, previously 1.6 cm, image 61 series 8. Posterior right upper lobe subpleural patchy airspace opacities as well as bibasilar dependent atelectasis. Basilar atelectasis has increased. Chronic calcified right lower lobe granuloma noted. No pleural effusion, additional pleural abnormality, or pneumothorax. Upper Abdomen: Ascites noted in the upper abdomen as well as extensive known bulky multifocal hepatic masses compatible with hepatocellular carcinoma. Liver is only partially imaged by chest CTA. Splenic punctate granulomata also noted. Musculoskeletal: No acute or significant osseous finding. No compression fracture. Sternum  intact. Review of the MIP images confirms the above findings. IMPRESSION: Positive exam for bilateral segmental pulmonary emboli as above without evidence of heart strain. Progression of innumerable bilateral pulmonary metastases, increased in both size and number. Posterior right upper lobe subpleural patchy airspace disease/consolidation may represent pneumonia, aspiration or pulmonary hemorrhage. Increased dependent bibasilar atelectasis. Known bulky multifocal hepatic lesions compatible with hepatocellular carcinoma. Small amount of associated upper abdominal ascites. Aortic Atherosclerosis (ICD10-I70.0). Electronically Signed   By: MJerilynn Mages  Shick M.D.   On: 07/27/2019 08:24   DG Chest Portable 1 View  Result Date: 07/27/2019 CLINICAL DATA:  Multifocal and metastatic hepatocellular carcinoma. Hemoptysis. EXAM: PORTABLE CHEST 1 VIEW COMPARISON:  05/02/2019 FINDINGS: Innumerable pulmonary nodules are scattered throughout the lungs. There is some hazy opacities in both lung basis on the right mid lung which could reflect superimposed multilobar pneumonia or pulmonary hemorrhage. Heart size within normal limits. Degenerative glenohumeral arthropathy on the left. IMPRESSION: 1. Innumerable pulmonary nodules compatible with metastatic disease. 2. Hazy opacities in the right mid lung, potentially from multilobar pneumonia or pulmonary hemorrhage. Electronically Signed   By: WVan ClinesM.D.   On: 07/27/2019 06:08   VAS [place]OWER EXTREMITY VENOUS (DVT)  Result Date: 07/27/2019  Lower Venous DVTStudy Indications: Pulmonary embolism.  Comparison Study: No prior study Performing Technologist: MMaudry MayhewMHA, RDMS, RVT, RDCS  Examination Guidelines: A complete evaluation includes B-mode imaging, spectral Doppler, color Doppler, and power Doppler as needed of all accessible portions of each vessel. Bilateral testing is considered an integral part of a complete examination. Limited examinations for reoccurring  indications may be performed as noted. The reflux portion of the exam is performed with the patient in reverse Trendelenburg.  +---------+---------------+---------+-----------+----------+--------------+ RIGHT    CompressibilityPhasicitySpontaneityPropertiesThrombus Aging +---------+---------------+---------+-----------+----------+--------------+ CFV      Full           Yes      Yes                                 +---------+---------------+---------+-----------+----------+--------------+  SFJ      Full                                                        +---------+---------------+---------+-----------+----------+--------------+ FV Prox  Full                                                        +---------+---------------+---------+-----------+----------+--------------+ FV Mid   Full                                                        +---------+---------------+---------+-----------+----------+--------------+ FV DistalFull                                                        +---------+---------------+---------+-----------+----------+--------------+ PFV      Full                                                        +---------+---------------+---------+-----------+----------+--------------+ POP      Full           Yes      Yes                                 +---------+---------------+---------+-----------+----------+--------------+ PTV      Full                                                        +---------+---------------+---------+-----------+----------+--------------+   Right Technical Findings: Not visualized segments include peroneal veins.  +---------+---------------+---------+-----------+----------+--------------+ LEFT     CompressibilityPhasicitySpontaneityPropertiesThrombus Aging +---------+---------------+---------+-----------+----------+--------------+ CFV      Full           Yes      Yes                                  +---------+---------------+---------+-----------+----------+--------------+ SFJ      Full                                                        +---------+---------------+---------+-----------+----------+--------------+ FV Prox  Full                                                        +---------+---------------+---------+-----------+----------+--------------+  FV Mid   Full                                                        +---------+---------------+---------+-----------+----------+--------------+ FV DistalFull                                                        +---------+---------------+---------+-----------+----------+--------------+ PFV      Full                                                        +---------+---------------+---------+-----------+----------+--------------+ POP      Full           Yes      Yes                                 +---------+---------------+---------+-----------+----------+--------------+ PTV      Full                                                        +---------+---------------+---------+-----------+----------+--------------+   Left Technical Findings: Not visualized segments include peroneal veins.   Summary: RIGHT: - There is no evidence of deep vein thrombosis in the lower extremity. However, portions of this examination were limited- see technologist comments above.  - No cystic structure found in the popliteal fossa.  LEFT: - There is no evidence of deep vein thrombosis in the lower extremity. However, portions of this examination were limited- see technologist comments above.  - No cystic structure found in the popliteal fossa.  *See table(s) above for measurements and observations. Electronically signed by Harold Barban MD on 07/27/2019 at 5:10:15 PM.    Final    VAS [place] LOWER EXTREMITY VENOUS (DVT)  Final Result    CT Angio Chest PE W and/or Wo Contrast  Final Result    DG Chest Portable 1 View  Final  Result       Scheduled Meds: . amLODipine  10 mg Oral Daily  . carvedilol  12.5 mg Oral BID  . Chlorhexidine Gluconate Cloth  6 each Topical Q0600  . dexamethasone  8 mg Intravenous Q12H  . feeding supplement (ENSURE ENLIVE)  237 mL Oral BID BM   PRN Meds: acetaminophen, albuterol, benzonatate, cyclobenzaprine, gabapentin, guaiFENesin-dextromethorphan, ondansetron **OR** ondansetron (ZOFRAN) IV, oxyCODONE, sodium chloride Continuous Infusions:    LOS: 1 day  Time spent: Greater than 50% of the 35 minute visit was spent in counseling/coordination of care for the patient as laid out in the A&P.   Dwyane Dee, MD [facility] 07/28/2019, 4:18 PM   Contact via secure chat.  To contact the attending provider between 7A-7P or the covering provider during after hours 7P-7A, please log into the web site [email or web address] and access using universal [place] password for  that web site. If you do not have the password, please call the hospital operator.

## 2019-07-28 NOTE — Assessment & Plan Note (Signed)
-   continue O2 for comfort

## 2019-07-28 NOTE — Hospital Course (Signed)
William Solis is a 69 yo CM with current history of stage IV HCC with mets to liver and now with bilateral Pes and hemoptysis. He has had overall progressive decline in his health and worsening quality of life. He was hospitalized 6/28-7/6 due to a fall that resulted in a SDH and was taken off anticoagulation. He was discharged home and presented back the following day on 07/27/19 with his hemoptysis. He has disease progression on repeat CT scans and has not responded much to recent trial of chemo with oncology.  He was evaluated by oncology and pulmonology on admission as well. After extensive bedside discussions with patient and his wife, they have elected to discharge to residential hospice at [facility] place for ongoing hospice care. He was offered morphine/dilaudid infusions and declined stating that oxycodone seems to help most.

## 2019-07-28 NOTE — Progress Notes (Addendum)
HEMATOLOGY-ONCOLOGY PROGRESS NOTE  SUBJECTIVE: The patient was sent to the emergency room from SNF due to hemoptysis.  CTA chest showed bilateral segmental pulmonary emboli and significant progression of his pulmonary metastases.  He was seen by pulmonology who recommended to continue to hold anticoagulation, nebulized tranexamic acid, and discussion of goals of care.  The patient reports pain is overall controlled with oral medications. Denies any further hemoptysis.   Oncology History  Hepatocellular carcinoma ([place])  04/25/2019 Initial Diagnosis   Hepatocellular carcinoma ([place])   04/28/2019 - 06/09/2019 Chemotherapy   The patient had atezolizumab (TECENTRIQ) 1,200 mg in sodium chloride 0.9 % 250 mL chemo infusion, 1,200 mg, Intravenous, Once, 3 of 6 cycles Administration: 1,200 mg (04/28/2019), 1,200 mg (05/19/2019), 1,200 mg (06/09/2019) bevacizumab-bvzr (ZIRABEV) 1,400 mg in sodium chloride 0.9 % 100 mL chemo infusion, 15 mg/kg = 1,400 mg, Intravenous,  Once, 3 of 6 cycles Administration: 1,400 mg (04/28/2019), 1,400 mg (05/19/2019), 1,400 mg (06/09/2019)  for chemotherapy treatment.      PHYSICAL EXAMINATION:  Vitals:   07/28/19 0439 07/28/19 0500  BP: 129/78   Pulse: 75   Resp: 18 18  Temp: 97.6 F (36.4 C)   SpO2: 96%    Filed Weights   07/27/19 1513  Weight: 86 kg    Intake/Output from previous day: 07/07 0701 - 07/08 0700 In: 982.6 [P.O.:240; IV Piggyback:742.6] Out: 725 [Urine:725]  GENERAL:chronically ill appearing, no distress LUNGS: diminished breath sounds HEART: regular rate & rhythm and no murmurs, 1+ BLE edema ABDOMEN: mild distention, no tenderness NEURO: alert & oriented x 3 with fluent speech, no focal motor/sensory deficits  LABORATORY DATA:  I have reviewed the data as listed CMP Latest Ref Rng & Units 07/28/2019 07/27/2019 07/27/2019  Glucose 70 - 99 mg/dL 96 84 81  BUN 8 - 23 mg/dL 28(H) 35(H) 35(H)  Creatinine 0.61 - 1.24 mg/dL 0.74 0.70 0.74  Sodium 135 -  145 mmol/L 134(L) 134(L) 132(L)  Potassium 3.5 - 5.1 mmol/L 5.2(H) 4.9 5.0  Chloride 98 - 111 mmol/L 102 101 99  CO2 22 - 32 mmol/L 24 - 22  Calcium 8.9 - 10.3 mg/dL 8.1(L) - 8.2(L)  Total Protein 6.5 - 8.1 g/dL 6.4(L) - 6.7  Total Bilirubin 0.3 - 1.2 mg/dL 2.0(H) - 2.5(H)  Alkaline Phos 38 - 126 U/L 239(H) - 282(H)  AST 15 - 41 U/L 175(H) - 227(H)  ALT 0 - 44 U/L 180(H) - 203(H)    Lab Results  Component Value Date   WBC 12.7 (H) 07/28/2019   HGB 14.2 07/28/2019   HCT 48.7 07/28/2019   MCV 82.4 07/28/2019   PLT 106 (L) 07/28/2019   NEUTROABS 11.3 (H) 07/27/2019    CT HEAD WO CONTRAST  Result Date: 07/18/2019 CLINICAL DATA:  Subdural hematoma, follow-up EXAM: CT HEAD WITHOUT CONTRAST TECHNIQUE: Contiguous axial images were obtained from the base of the skull through the vertex without intravenous contrast. COMPARISON:  07/18/2019 FINDINGS: Brain: Small subdural hematoma along the posterior falx measuring 4 mm compared to 6 mm previously. Blood extends along the right side of the tentorium. No mass effect or midline shift. No new areas of hemorrhage. Vascular: No hyperdense vessel or unexpected calcification. Skull: No acute calvarial abnormality. Sinuses/Orbits: Visualized paranasal sinuses and mastoids clear. Orbital soft tissues unremarkable. Other: None IMPRESSION: Small subdural hematoma along the posterior falx and right tentorium, slightly decreased in size since prior study. Electronically Signed   By: Rolm Baptise M.D.   On: 07/18/2019 15:11   CT  HEAD WO CONTRAST  Result Date: 07/18/2019 CLINICAL DATA:  [place], hit head, anticoagulated, hepatocellular carcinoma EXAM: CT HEAD WITHOUT CONTRAST TECHNIQUE: Contiguous axial images were obtained from the base of the skull through the vertex without intravenous contrast. COMPARISON:  None. FINDINGS: Brain: There is a posterior falcine subdural hematoma measuring up to 6 mm. The subdural extends along the right side tentorium. No mass  effect. No acute infarct. Lateral ventricles and remaining midline structures are unremarkable. Vascular: No hyperdense vessel or unexpected calcification. Skull: Normal. Negative for fracture or focal lesion. Sinuses/Orbits: Minimal fluid within the left maxillary sinus. Remaining paranasal sinuses are clear. Other: None. IMPRESSION: 1. Small posterior falcine subdural hematoma extending along the right side tentorium. Maximal thickness measures 6 mm. No mass effect. These results were called by telephone at the time of interpretation on 07/18/2019 at 2:20 am to provider PA Quincy Carnes, who verbally acknowledged these results. Electronically Signed   By: Randa Ngo M.D.   On: 07/18/2019 02:21   CT Angio Chest PE W and/or Wo Contrast  Result Date: 07/27/2019 CLINICAL DATA:  Metastatic hepatocellular carcinoma, shortness of breath, hemoptysis, known pulmonary metastases EXAM: CT ANGIOGRAPHY CHEST WITH CONTRAST TECHNIQUE: Multidetector CT imaging of the chest was performed using the standard protocol during bolus administration of intravenous contrast. Multiplanar CT image reconstructions and MIPs were obtained to evaluate the vascular anatomy. CONTRAST:  163mL OMNIPAQUE IOHEXOL 350 MG/ML SOLN COMPARISON:  06/28/2019 FINDINGS: Cardiovascular: Branching hypodense filling defects noted in the pulmonary arteries extending into both upper lobes as well as the lingula and left lower lobe, compatible with acute bilateral pulmonary emboli. No significant central or saddle embolus. Pulmonary emboli are predominately segmental in distribution. RV to LV ratio is 0.82.  Negative for CT evidence of heart strain. Intact thoracic aorta. Three vessel arch anatomy. No aneurysm or dissection. No mediastinal hemorrhage or hematoma. Normal heart size. Mitral valve calcifications present. No pericardial effusion. Mediastinum/Nodes: Thyroid, trachea and esophagus unremarkable. Trachea and central airways appear patent. Esophagus  nondilated. No hiatal hernia. Calcified subcarinal and right infrahilar lymph nodes as before. Lungs/Pleura: Extensive innumerable bilateral pulmonary metastases throughout all lobes of both lungs. These metastases appear increased in both size and number. Index central right upper lobe lesion measures 1.8 cm, previously 1.6 cm, image 61 series 8. Posterior right upper lobe subpleural patchy airspace opacities as well as bibasilar dependent atelectasis. Basilar atelectasis has increased. Chronic calcified right lower lobe granuloma noted. No pleural effusion, additional pleural abnormality, or pneumothorax. Upper Abdomen: Ascites noted in the upper abdomen as well as extensive known bulky multifocal hepatic masses compatible with hepatocellular carcinoma. Liver is only partially imaged by chest CTA. Splenic punctate granulomata also noted. Musculoskeletal: No acute or significant osseous finding. No compression fracture. Sternum intact. Review of the MIP images confirms the above findings. IMPRESSION: Positive exam for bilateral segmental pulmonary emboli as above without evidence of heart strain. Progression of innumerable bilateral pulmonary metastases, increased in both size and number. Posterior right upper lobe subpleural patchy airspace disease/consolidation may represent pneumonia, aspiration or pulmonary hemorrhage. Increased dependent bibasilar atelectasis. Known bulky multifocal hepatic lesions compatible with hepatocellular carcinoma. Small amount of associated upper abdominal ascites. Aortic Atherosclerosis (ICD10-I70.0). Electronically Signed   By: Jerilynn Mages.  Shick M.D.   On: 07/27/2019 08:24   DG Chest Portable 1 View  Result Date: 07/27/2019 CLINICAL DATA:  Multifocal and metastatic hepatocellular carcinoma. Hemoptysis. EXAM: PORTABLE CHEST 1 VIEW COMPARISON:  05/02/2019 FINDINGS: Innumerable pulmonary nodules are scattered throughout the lungs. There is  some hazy opacities in both lung basis on the right  mid lung which could reflect superimposed multilobar pneumonia or pulmonary hemorrhage. Heart size within normal limits. Degenerative glenohumeral arthropathy on the left. IMPRESSION: 1. Innumerable pulmonary nodules compatible with metastatic disease. 2. Hazy opacities in the right mid lung, potentially from multilobar pneumonia or pulmonary hemorrhage. Electronically Signed   By: Van Clines M.D.   On: 07/27/2019 06:08   VAS [place] LOWER EXTREMITY VENOUS (DVT)  Result Date: 07/27/2019  Lower Venous DVTStudy Indications: Pulmonary embolism.  Comparison Study: No prior study Performing Technologist: Maudry Mayhew MHA, RDMS, RVT, RDCS  Examination Guidelines: A complete evaluation includes B-mode imaging, spectral Doppler, color Doppler, and power Doppler as needed of all accessible portions of each vessel. Bilateral testing is considered an integral part of a complete examination. Limited examinations for reoccurring indications may be performed as noted. The reflux portion of the exam is performed with the patient in reverse Trendelenburg.  +---------+---------------+---------+-----------+----------+--------------+ RIGHT    CompressibilityPhasicitySpontaneityPropertiesThrombus Aging +---------+---------------+---------+-----------+----------+--------------+ CFV      Full           Yes      Yes                                 +---------+---------------+---------+-----------+----------+--------------+ SFJ      Full                                                        +---------+---------------+---------+-----------+----------+--------------+ FV Prox  Full                                                        +---------+---------------+---------+-----------+----------+--------------+ FV Mid   Full                                                        +---------+---------------+---------+-----------+----------+--------------+ FV DistalFull                                                         +---------+---------------+---------+-----------+----------+--------------+ PFV      Full                                                        +---------+---------------+---------+-----------+----------+--------------+ POP      Full           Yes      Yes                                 +---------+---------------+---------+-----------+----------+--------------+ PTV      Full                                                        +---------+---------------+---------+-----------+----------+--------------+  Right Technical Findings: Not visualized segments include peroneal veins.  +---------+---------------+---------+-----------+----------+--------------+ LEFT     CompressibilityPhasicitySpontaneityPropertiesThrombus Aging +---------+---------------+---------+-----------+----------+--------------+ CFV      Full           Yes      Yes                                 +---------+---------------+---------+-----------+----------+--------------+ SFJ      Full                                                        +---------+---------------+---------+-----------+----------+--------------+ FV Prox  Full                                                        +---------+---------------+---------+-----------+----------+--------------+ FV Mid   Full                                                        +---------+---------------+---------+-----------+----------+--------------+ FV DistalFull                                                        +---------+---------------+---------+-----------+----------+--------------+ PFV      Full                                                        +---------+---------------+---------+-----------+----------+--------------+ POP      Full           Yes      Yes                                 +---------+---------------+---------+-----------+----------+--------------+ PTV      Full                                                         +---------+---------------+---------+-----------+----------+--------------+   Left Technical Findings: Not visualized segments include peroneal veins.   Summary: RIGHT: - There is no evidence of deep vein thrombosis in the lower extremity. However, portions of this examination were limited- see technologist comments above.  - No cystic structure found in the popliteal fossa.  LEFT: - There is no evidence of deep vein thrombosis in the lower extremity. However, portions of this examination were limited- see technologist comments above.  - No cystic structure found in the popliteal fossa.  *See table(s) above for measurements and observations. Electronically signed by Harold Barban  MD on 07/27/2019 at 5:10:15 PM.    Final     ASSESSMENT AND PLAN: 1. Hepatocellular carcinoma  Ultrasound abdomen 04/11/1999-2 solid hepatic masses, largest measuring 15 cm  MRI liver 04/12/2019-cirrhosis, dominant central hepatic mass, dominant segment 6 mass, several small enhancing lesions in the bilateral liver, imaging features consistent with LR-5 criteria  Cycle 1 atezolizumab/Avastin 04/28/2019  CT chest 05/02/2019-segmental pulmonary emboli within the right upper and left lower lobes. Innumerable bilateral pulmonary nodules.  CT abdomen/pelvis 05/02/2019-stable multifocal hepatocellular carcinoma, stable portacaval adenopathy  Cycle 2 atezolizumab/Avastin 05/19/2019  Cycle 3 atezolizumab/Avastin 06/09/2019  CTs 06/28/2019-interval enlargement of bulky liver masses. Multiple new and enlarged much smaller lesions throughout the liver. Innumerable new and enlarged pulmonary nodules.  Lenvatinib started 07/14/2019 2. History of hepatitis C, treatedin 2019 3. Depression 4. ADHD 5. Lower extremity neuropathy 6. Hypertension 7. Status post partial left foot amputation 8. Chest CT 05/02/2019-segmental pulmonary emboli within the right upper and left lower lobes,  minimal clot burden no evidence of right heart strain. Started on Xarelto, discontinued on hospital admission 07/18/2019 9. Hospital admission 07/18/2019 for subdural hematoma following a fall 10. Hospital admission 07/27/2019-hemoptysis  Mr. Simonich is now readmitted secondary to hemoptysis.  CT scan shows significant disease progression.  Discussed with the patient and his wife.  Recommend discontinuation of lenvatinib and referral for residential hospice.  The patient and his wife are agreeable to this.  Discussed starting him on a morphine drip or Dilaudid drip, but the patient prefers to stay on oxycodone.  Recommendations: 1.  TOC consult for referral to [facility]. 2.  Continue oxycodone for pain.  Consider transitioning to OxyFast if difficulty swallowing. 3.  Consider discontinuing unnecessary medications and shift focus to comfort.   LOS: 1 day   Mikey Bussing, DNP, AGPCNP-BC, AOCNP 07/28/19 Mr. Shinault was interviewed and examined.  His wife was at the bedside.  I reviewed chest CT images with him.  Mr. Brideau has metastatic hepatocellular carcinoma.  He was admitted with hemoptysis.  Hemoptysis is likely secondary to progression of hepatocellular carcinoma in the lungs.  His performance status has declined and he has progressive metastatic disease since starting lenvatinib.  I recommend discontinuing lenvatinib.  He appears to be a candidate for hospice care and [facility].  Mr. Swigart and his wife are in agreement with hospice care.  I will add palliative Decadron for the cough and dyspnea.  He would like to continue oxycodone for pain.  He reports morphine and Dilaudid have not helped in the past.

## 2019-07-28 NOTE — Assessment & Plan Note (Signed)
-   continue qhs CPAP if patient wishes

## 2019-07-28 NOTE — Progress Notes (Signed)
Pt refused CPAP qhs.  Pt states that he is unable to tolerate wearing CPAP at night.  Pt stated that it felt like he was breathing acid.  Worked through several possible causes for the feeling and solutions for them but Pt still refused CPAP.  Pt encouraged to contact RT should he change his mind.

## 2019-07-28 NOTE — Progress Notes (Signed)
[facility] P[facility]  S: no hemoptysis, still has some cough O:  Vitals:   07/28/19 0300 07/28/19 0400 07/28/19 0439 07/28/19 0500  BP:   129/78   Pulse:   75   Resp: 16 (!) 23 18 18   Temp:   97.6 F (36.4 C)   TempSrc:   Oral   SpO2:   96%   Weight:      Height:       4-5Lpm [place]  General:  Chronically ill appearing HENT: NCAT OP clear PULM: Crackles bases B, normal effort CV: RRR, no mgr GI: mildly distended, mild tenderness all ovr MSK: normal bulk and tone Neuro: awake, alert, no distress, MAEW  CBC    Component Value Date/Time   WBC 12.7 (H) 07/28/2019 0559   RBC 5.91 (H) 07/28/2019 0559   HGB 14.2 07/28/2019 0559   HGB 16.0 06/28/2019 0905   HCT 48.7 07/28/2019 0559   PLT 106 (L) 07/28/2019 0559   PLT 121 (L) 06/28/2019 0905   MCV 82.4 07/28/2019 0559   MCH 24.0 (L) 07/28/2019 0559   MCHC 29.2 (L) 07/28/2019 0559   RDW 23.5 (H) 07/28/2019 0559   LYMPHSABS 1.0 07/27/2019 0533   MONOABS 2.1 (H) 07/27/2019 0533   EOSABS 0.3 07/27/2019 0533   BASOSABS 0.1 07/27/2019 0533   Impression/Plan:  Hemoptysis due to multiple lung HCC metastatic lesions: improved significantly after TXA; overall prognosis poor; agree completely with inpatient hospice, systemic steroids  Roselie Awkward, MD [place] P[facility]M Pager: [phone] Cell: [phone] If no response, call [phone]

## 2019-07-28 NOTE — Plan of Care (Signed)
Discussed with patient plan of care for the evening, pain management and night time medications with some teach back displayed.  Tried to explain importance of wearing CPAP with patient fearing he would choke.

## 2019-07-29 ENCOUNTER — Other Ambulatory Visit: Payer: Self-pay

## 2019-07-29 MED ORDER — DEXAMETHASONE 4 MG PO TABS
8.0000 mg | ORAL_TABLET | Freq: Two times a day (BID) | ORAL | 0 refills | Status: AC
Start: 2019-07-29 — End: 2020-07-28

## 2019-07-29 NOTE — Care Management Important Message (Signed)
Important Message  Patient Details IM Letter presented to the Patient Name: William Solis MRN: [id number] Date of Birth: 06/30/1950   Medicare Important Message Given:  Yes     Kerin Salen 07/29/2019, 11:01 AM

## 2019-07-29 NOTE — Discharge Summary (Signed)
Physician Discharge Summary  William Solis HBZ:[id number] DOB: [birth year] DOA: 07/27/2019  PCP: Leamon Arnt, MD  Admit date: 07/27/2019 Discharge date: 07/29/2019  Admitted From: home Disposition:  [place] Discharging physician: Dwyane Dee, MD  Recommendations for Outpatient Follow-up:  1. Continue adjusting pain meds for comfort   Patient discharged to [facility] in Discharge Condition: poor CODE STATUS: DNR Diet recommendation:  Diet Orders (From admission, onward)    Start     Ordered   07/29/19 0000  Diet general        07/29/19 1153   07/27/19 1152  Diet regular Room service appropriate? Yes; Fluid consistency: Thin  Diet effective now       Question Answer Comment  Room service appropriate? Yes   Fluid consistency: Thin      07/27/19 1151          Hospital Course: Mr. Collister is a 69 yo CM with current history of stage IV HCC with mets to liver and now with bilateral Pes and hemoptysis. He has had overall progressive decline in his health and worsening quality of life. He was hospitalized 6/28-7/6 due to a fall that resulted in a SDH and was taken off anticoagulation. He was discharged home and presented back the following day on 07/27/19 with his hemoptysis. He has disease progression on repeat CT scans and has not responded much to recent trial of chemo with oncology.  He was evaluated by oncology and pulmonology on admission as well. After extensive bedside discussions with patient and his wife, they have elected to discharge to residential hospice at [facility] place for ongoing hospice care. He was offered morphine/dilaudid infusions and declined stating that oxycodone seems to help most.    Bilateral edema of lower extremity - multifactorial due to malnutrition, steroid use, and progressive physical decline  - supportive care as now transitioning to hospice/comfort  Chronic low back pain - continue oxycodone; if needed will trial other pain relief    OSA on CPAP - continue qhs CPAP if patient wishes  Hepatocellular carcinoma ([place]) - stage IV, now with progressively worsening disease process and patient likely approaching end of life - evaluated by oncology and pulmonology and in agreement with pursuing hospice/end of life/comfort care - continue pain control; patient prefers oxy over morphine/dilaudid at this time; will treat as needed - continue oxycodone - await placement to [facility] place and will discharge when bed available   SDH (subdural hematoma) (HCC) - anticoagulation remains on hold from previous hospitalization, plus transitioning to hospice   DNR (do not resuscitate) - discharging to hospice   Acute on chronic respiratory failure with hypoxia ([place]) - continue O2 for comfort  Hemoptysis - responded some to TXA neb - due to underlying PE and likely to recur  Bilateral pulmonary embolism (HCC) - oxycodone for pain control and comfort; further pain control as needed  Cancer with pulmonary metastases ([place]) - see [place]    The patient's chronic medical conditions were treated accordingly per the patient's home medication regimen except as noted.  On day of discharge, patient was felt deemed stable for discharge. Patient/family member advised to call PCP or come back to ER if needed.   Discharge Diagnoses:   Principal Diagnosis: Acute on chronic respiratory failure with hypoxia [facility])  Active Hospital Problems   Diagnosis Date Noted  . Acute on chronic respiratory failure with hypoxia ([place]) 07/27/2019  . Hepatocellular carcinoma ([place]) 04/25/2019    Priority: High  . Hemoptysis 07/27/2019  .  Bilateral pulmonary embolism ([place]) 07/27/2019  . Cancer with pulmonary metastases ([place]) 07/27/2019  . DNR (do not resuscitate)   . OSA on CPAP 11/07/2015  . Bilateral edema of lower extremity 10/12/2015  . Chronic low back pain 05/12/2012    Resolved Hospital Problems   Diagnosis Date Noted Date Resolved  . SDH (subdural  hematoma) ([place]) 07/18/2019 07/29/2019    Discharge Instructions    Diet general   Complete by: As directed    Increase activity slowly   Complete by: As directed      Allergies as of 07/29/2019   No Known Allergies     Medication List    STOP taking these medications   ARNICA EX   cholecalciferol 25 MCG (1000 UNIT) tablet Commonly known as: VITAMIN D3   Lenvima (12 MG Daily Dose) 3 x 4 MG capsule Generic drug: Lenvatinib 12 mg daily dose   multivitamin with minerals tablet   VITAMIN B-12 PO   vitamin C 1000 MG tablet   vitamin E 180 MG (400 UNITS) capsule Generic drug: vitamin E     TAKE these medications   acetaminophen 500 MG tablet Commonly known as: TYLENOL Take 500 mg by mouth every 6 (six) hours as needed for mild pain or headache.   albuterol (2.5 MG/3ML) 0.083% nebulizer solution Commonly known as: PROVENTIL Take 3 mLs (2.5 mg total) by nebulization every 6 (six) hours as needed for wheezing or shortness of breath.   amLODipine 10 MG tablet Commonly known as: NORVASC TAKE 1 TABLET EVERY DAY   benzonatate 100 MG capsule Commonly known as: TESSALON Take 1 capsule (100 mg total) by mouth 2 (two) times daily as needed for cough.   carvedilol 12.5 MG tablet Commonly known as: COREG Take 1 tablet (12.5 mg total) by mouth 2 (two) times daily.   cyclobenzaprine 10 MG tablet Commonly known as: FLEXERIL Take 10 mg by mouth 3 (three) times daily as needed for muscle spasms.   dexamethasone 4 MG tablet Commonly known as: Decadron Take 2 tablets (8 mg total) by mouth 2 (two) times daily.   feeding supplement (ENSURE ENLIVE) Liqd Take 237 mLs by mouth 2 (two) times daily between meals.   gabapentin 300 MG capsule Commonly known as: NEURONTIN Take 1 capsule (300 mg total) by mouth daily as needed for up to 7 days (pain).   guaiFENesin-dextromethorphan 100-10 MG/5ML syrup Commonly known as: ROBITUSSIN DM Take 5 mLs by mouth every 4 (four) hours as needed  for cough.   levalbuterol 45 MCG/ACT inhaler Commonly known as: XOPENEX HFA Inhale 1 puff into the lungs every 6 (six) hours as needed for wheezing.   lidocaine 5 % Commonly known as: LIDODERM Place 1 patch onto the skin daily as needed (pain). Pain. Remove & Discard patch within 12 hours or as directed by MD   Oxycodone HCl 10 MG Tabs Take 1 tablet (10 mg total) by mouth every 4 (four) hours as needed for up to 3 days (pain).   prochlorperazine 10 MG tablet Commonly known as: COMPAZINE Take 1 tablet (10 mg total) by mouth every 6 (six) hours as needed for nausea.   sodium chloride 0.65 % Soln nasal spray Commonly known as: OCEAN Place 1 spray into both nostrils as needed for congestion.   VISINE OP Place 1 drop into both eyes 3 (three) times daily as needed (dry eyes).       No Known Allergies  Consultations: Pulmonology Oncology   Procedures/Studies: CT HEAD WO CONTRAST  Result Date: 07/18/2019 CLINICAL DATA:  Subdural hematoma, follow-up EXAM: CT HEAD WITHOUT CONTRAST TECHNIQUE: Contiguous axial images were obtained from the base of the skull through the vertex without intravenous contrast. COMPARISON:  07/18/2019 FINDINGS: Brain: Small subdural hematoma along the posterior falx measuring 4 mm compared to 6 mm previously. Blood extends along the right side of the tentorium. No mass effect or midline shift. No new areas of hemorrhage. Vascular: No hyperdense vessel or unexpected calcification. Skull: No acute calvarial abnormality. Sinuses/Orbits: Visualized paranasal sinuses and mastoids clear. Orbital soft tissues unremarkable. Other: None IMPRESSION: Small subdural hematoma along the posterior falx and right tentorium, slightly decreased in size since prior study. Electronically Signed   By: Rolm Baptise M.D.   On: 07/18/2019 15:11   CT HEAD WO CONTRAST  Result Date: 07/18/2019 CLINICAL DATA:  [place], hit head, anticoagulated, hepatocellular carcinoma EXAM: CT HEAD WITHOUT  CONTRAST TECHNIQUE: Contiguous axial images were obtained from the base of the skull through the vertex without intravenous contrast. COMPARISON:  None. FINDINGS: Brain: There is a posterior falcine subdural hematoma measuring up to 6 mm. The subdural extends along the right side tentorium. No mass effect. No acute infarct. Lateral ventricles and remaining midline structures are unremarkable. Vascular: No hyperdense vessel or unexpected calcification. Skull: Normal. Negative for fracture or focal lesion. Sinuses/Orbits: Minimal fluid within the left maxillary sinus. Remaining paranasal sinuses are clear. Other: None. IMPRESSION: 1. Small posterior falcine subdural hematoma extending along the right side tentorium. Maximal thickness measures 6 mm. No mass effect. These results were called by telephone at the time of interpretation on 07/18/2019 at 2:20 am to provider PA Quincy Carnes, who verbally acknowledged these results. Electronically Signed   By: Randa Ngo M.D.   On: 07/18/2019 02:21   CT Angio Chest PE W and/or Wo Contrast  Result Date: 07/27/2019 CLINICAL DATA:  Metastatic hepatocellular carcinoma, shortness of breath, hemoptysis, known pulmonary metastases EXAM: CT ANGIOGRAPHY CHEST WITH CONTRAST TECHNIQUE: Multidetector CT imaging of the chest was performed using the standard protocol during bolus administration of intravenous contrast. Multiplanar CT image reconstructions and MIPs were obtained to evaluate the vascular anatomy. CONTRAST:  185mL OMNIPAQUE IOHEXOL 350 MG/ML SOLN COMPARISON:  06/28/2019 FINDINGS: Cardiovascular: Branching hypodense filling defects noted in the pulmonary arteries extending into both upper lobes as well as the lingula and left lower lobe, compatible with acute bilateral pulmonary emboli. No significant central or saddle embolus. Pulmonary emboli are predominately segmental in distribution. RV to LV ratio is 0.82.  Negative for CT evidence of heart strain. Intact thoracic  aorta. Three vessel arch anatomy. No aneurysm or dissection. No mediastinal hemorrhage or hematoma. Normal heart size. Mitral valve calcifications present. No pericardial effusion. Mediastinum/Nodes: Thyroid, trachea and esophagus unremarkable. Trachea and central airways appear patent. Esophagus nondilated. No hiatal hernia. Calcified subcarinal and right infrahilar lymph nodes as before. Lungs/Pleura: Extensive innumerable bilateral pulmonary metastases throughout all lobes of both lungs. These metastases appear increased in both size and number. Index central right upper lobe lesion measures 1.8 cm, previously 1.6 cm, image 61 series 8. Posterior right upper lobe subpleural patchy airspace opacities as well as bibasilar dependent atelectasis. Basilar atelectasis has increased. Chronic calcified right lower lobe granuloma noted. No pleural effusion, additional pleural abnormality, or pneumothorax. Upper Abdomen: Ascites noted in the upper abdomen as well as extensive known bulky multifocal hepatic masses compatible with hepatocellular carcinoma. Liver is only partially imaged by chest CTA. Splenic punctate granulomata also noted. Musculoskeletal: No acute or significant osseous finding.  No compression fracture. Sternum intact. Review of the MIP images confirms the above findings. IMPRESSION: Positive exam for bilateral segmental pulmonary emboli as above without evidence of heart strain. Progression of innumerable bilateral pulmonary metastases, increased in both size and number. Posterior right upper lobe subpleural patchy airspace disease/consolidation may represent pneumonia, aspiration or pulmonary hemorrhage. Increased dependent bibasilar atelectasis. Known bulky multifocal hepatic lesions compatible with hepatocellular carcinoma. Small amount of associated upper abdominal ascites. Aortic Atherosclerosis (ICD10-I70.0). Electronically Signed   By: Jerilynn Mages.  Shick M.D.   On: 07/27/2019 08:24   DG Chest Portable 1  View  Result Date: 07/27/2019 CLINICAL DATA:  Multifocal and metastatic hepatocellular carcinoma. Hemoptysis. EXAM: PORTABLE CHEST 1 VIEW COMPARISON:  05/02/2019 FINDINGS: Innumerable pulmonary nodules are scattered throughout the lungs. There is some hazy opacities in both lung basis on the right mid lung which could reflect superimposed multilobar pneumonia or pulmonary hemorrhage. Heart size within normal limits. Degenerative glenohumeral arthropathy on the left. IMPRESSION: 1. Innumerable pulmonary nodules compatible with metastatic disease. 2. Hazy opacities in the right mid lung, potentially from multilobar pneumonia or pulmonary hemorrhage. Electronically Signed   By: Van Clines M.D.   On: 07/27/2019 06:08   VAS [place] LOWER EXTREMITY VENOUS (DVT)  Result Date: 07/27/2019  Lower Venous DVTStudy Indications: Pulmonary embolism.  Comparison Study: No prior study Performing Technologist: Maudry Mayhew MHA, RDMS, RVT, RDCS  Examination Guidelines: A complete evaluation includes B-mode imaging, spectral Doppler, color Doppler, and power Doppler as needed of all accessible portions of each vessel. Bilateral testing is considered an integral part of a complete examination. Limited examinations for reoccurring indications may be performed as noted. The reflux portion of the exam is performed with the patient in reverse Trendelenburg.  +---------+---------------+---------+-----------+----------+--------------+ RIGHT    CompressibilityPhasicitySpontaneityPropertiesThrombus Aging +---------+---------------+---------+-----------+----------+--------------+ CFV      Full           Yes      Yes                                 +---------+---------------+---------+-----------+----------+--------------+ SFJ      Full                                                        +---------+---------------+---------+-----------+----------+--------------+ FV Prox  Full                                                         +---------+---------------+---------+-----------+----------+--------------+ FV Mid   Full                                                        +---------+---------------+---------+-----------+----------+--------------+ FV DistalFull                                                        +---------+---------------+---------+-----------+----------+--------------+  PFV      Full                                                        +---------+---------------+---------+-----------+----------+--------------+ POP      Full           Yes      Yes                                 +---------+---------------+---------+-----------+----------+--------------+ PTV      Full                                                        +---------+---------------+---------+-----------+----------+--------------+   Right Technical Findings: Not visualized segments include peroneal veins.  +---------+---------------+---------+-----------+----------+--------------+ LEFT     CompressibilityPhasicitySpontaneityPropertiesThrombus Aging +---------+---------------+---------+-----------+----------+--------------+ CFV      Full           Yes      Yes                                 +---------+---------------+---------+-----------+----------+--------------+ SFJ      Full                                                        +---------+---------------+---------+-----------+----------+--------------+ FV Prox  Full                                                        +---------+---------------+---------+-----------+----------+--------------+ FV Mid   Full                                                        +---------+---------------+---------+-----------+----------+--------------+ FV DistalFull                                                        +---------+---------------+---------+-----------+----------+--------------+ PFV      Full                                                         +---------+---------------+---------+-----------+----------+--------------+ POP      Full           Yes      Yes                                 +---------+---------------+---------+-----------+----------+--------------+  PTV      Full                                                        +---------+---------------+---------+-----------+----------+--------------+   Left Technical Findings: Not visualized segments include peroneal veins.   Summary: RIGHT: - There is no evidence of deep vein thrombosis in the lower extremity. However, portions of this examination were limited- see technologist comments above.  - No cystic structure found in the popliteal fossa.  LEFT: - There is no evidence of deep vein thrombosis in the lower extremity. However, portions of this examination were limited- see technologist comments above.  - No cystic structure found in the popliteal fossa.  *See table(s) above for measurements and observations. Electronically signed by Harold Barban MD on 07/27/2019 at 5:10:15 PM.    Final     Discharge Exam: BP 135/75   Pulse 87   Temp 98 F (36.7 C)   Resp 20   Ht 6' (1.829 m)   Wt 86 kg   SpO2 99%   BMI 25.71 kg/m  General appearance: Chronically ill appearing man lying in bed very lethargic; comfortable today but still lethargic Head: Normocephalic, without obvious abnormality Eyes: EOMI Lungs: Scattered coarse breath sounds bilaterally Heart: regular rate and rhythm and S1, S2 normal Abdomen: Tender to palpation still.  Bowel sounds present Extremities: 3+ bilateral lower extremity pitting edema up to mid thighs Skin: mobility and turgor normal Neurologic: Grossly normal    The results of significant diagnostics from this hospitalization (including imaging, microbiology, ancillary and laboratory) are listed below for reference.     Microbiology: Recent Results (from the past 240 hour(s))  SARS  Coronavirus 2 by RT PCR (hospital order, performed in [facility] hospital lab) Nasopharyngeal Nasopharyngeal Swab     Status: None   Collection Time: 07/27/19  6:45 AM   Specimen: Nasopharyngeal Swab  Result Value Ref Range Status   SARS Coronavirus 2 NEGATIVE NEGATIVE Final    Comment: (NOTE) SARS-CoV-2 target nucleic acids are NOT DETECTED.  The SARS-CoV-2 RNA is generally detectable in upper and lower respiratory specimens during the acute phase of infection. The lowest concentration of SARS-CoV-2 viral copies this assay can detect is 250 copies / mL. A negative result does not preclude SARS-CoV-2 infection and should not be used as the sole basis for treatment or other patient management decisions.  A negative result may occur with improper specimen collection / handling, submission of specimen other than nasopharyngeal swab, presence of viral mutation(s) within the areas targeted by this assay, and inadequate number of viral copies (<250 copies / mL). A negative result must be combined with clinical observations, patient history, and epidemiological information.  Fact Sheet for Patients:   StrictlyIdeas.no  Fact Sheet for Healthcare Providers: BankingDealers.co.za  This test is not yet approved or  cleared by the [place] FDA and has been authorized for detection and/or diagnosis of SARS-CoV-2 by FDA under an Emergency Use Authorization (EUA).  This EUA will remain in effect (meaning this test can be used) for the duration of the COVID-19 declaration under Section 564(b)(1) of the Act, 21 U.S.C. section 360bbb-3(b)(1), unless the authorization is terminated or revoked sooner.  Performed at [facility], [place] [address], [place], [place] [address]   Culture, blood (Routine X 2) w  Reflex to ID Panel     Status: None (Preliminary result)   Collection Time: 07/27/19 11:24 AM   Specimen: BLOOD RIGHT HAND   Result Value Ref Range Status   Specimen Description   Final    BLOOD RIGHT HAND Performed at [place] [address] [place], [place] [address]    Special Requests   Final    BOTTLES DRAWN AEROBIC ONLY Blood Culture adequate volume Performed at [place] [address] [place], [place] [address]    Culture   Final    NO GROWTH < 24 HOURS Performed at [place] [address], [place], [place] [address]    Report Status PENDING  Incomplete  Culture, blood (Routine X 2) w Reflex to ID Panel     Status: None (Preliminary result)   Collection Time: 07/27/19 11:29 AM   Specimen: BLOOD LEFT HAND  Result Value Ref Range Status   Specimen Description   Final    BLOOD LEFT HAND Performed at [place] [address] [place], [place] [address]    Special Requests   Final    BOTTLES DRAWN AEROBIC AND ANAEROBIC Blood Culture adequate volume Performed at [facility] [address], [place], [place] [address]    Culture   Final    NO GROWTH < 24 HOURS Performed at [place] [address] [place], [place] [address]    Report Status PENDING  Incomplete  MRSA PCR Screening     Status: Abnormal   Collection Time: 07/27/19 11:58 AM   Specimen: Nasal Mucosa; Nasopharyngeal  Result Value Ref Range Status   MRSA by PCR POSITIVE (A) NEGATIVE Final    Comment:        The GeneXpert MRSA Assay (FDA approved for NASAL specimens only), is one component of a comprehensive MRSA colonization surveillance program. It is not intended to diagnose MRSA infection nor to guide or monitor treatment for MRSA infections. RESULT CALLED TO, READ BACK BY AND VERIFIED WITH: J TAYLOR AT 1628 ON 07/27/2019 BY MOSLEY,J Performed at [facility], [place] [address], [place], [place] [address]      Labs: BNP (last 3 results) Recent Labs    05/02/19 1217  BNP 419.6*   Basic  Metabolic Panel: Recent Labs  Lab 07/27/19 0533 07/27/19 0637 07/28/19 0559  NA 132* 134* 134*  K 5.0 4.9 5.2*  CL 99 101 102  CO2 22  --  24  GLUCOSE 81 84 96  BUN 35* 35* 28*  CREATININE 0.74 0.70 0.74  CALCIUM 8.2*  --  8.1*   Liver Function Tests: Recent Labs  Lab 07/27/19 0533 07/28/19 0559  AST 227* 175*  ALT 203* 180*  ALKPHOS 282* 239*  BILITOT 2.5* 2.0*  PROT 6.7 6.4*  ALBUMIN 2.0* 1.9*   No results for input(s): LIPASE, AMYLASE in the last 168 hours. No results for input(s): AMMONIA in the last 168 hours. CBC: Recent Labs  Lab 07/27/19 0533 07/27/19 0637 07/28/19 0559  WBC 14.8*  --  12.7*  NEUTROABS 11.3*  --   --   HGB 15.5 17.7* 14.2  HCT 52.4* 52.0 48.7  MCV 82.0  --  82.4  PLT 92*  --  106*   Cardiac Enzymes: No results for input(s): CKTOTAL, CKMB, CKMBINDEX, TROPONINI in the last 168 hours. BNP: Invalid input(s): POCBNP CBG: Recent Labs  Lab 07/24/19 2208  GLUCAP 116*   D-Dimer  No results for input(s): DDIMER in the last 72 hours. Hgb A1c No results for input(s): HGBA1C in the last 72 hours. Lipid Profile No results for input(s): CHOL, HDL, LDLCALC, TRIG, CHOLHDL, LDLDIRECT in the last 72 hours. Thyroid function studies No results for input(s): TSH, T4TOTAL, T3FREE, THYROIDAB in the last 72 hours.  Invalid input(s): FREET3 Anemia work up No results for input(s): VITAMINB12, FOLATE, FERRITIN, TIBC, IRON, RETICCTPCT in the last 72 hours. Urinalysis    Component Value Date/Time   COLORURINE AMBER (A) 07/18/2019 0859   APPEARANCEUR CLEAR 07/18/2019 0859   LABSPEC 1.013 07/18/2019 0859   PHURINE 5.0 07/18/2019 0859   GLUCOSEU NEGATIVE 07/18/2019 0859   HGBUR SMALL (A) 07/18/2019 0859   BILIRUBINUR NEGATIVE 07/18/2019 0859   BILIRUBINUR Negative 06/04/2018 1435   KETONESUR NEGATIVE 07/18/2019 0859   PROTEINUR NEGATIVE 07/18/2019 0859   UROBILINOGEN 0.2 06/04/2018 1435   NITRITE NEGATIVE 07/18/2019 0859   LEUKOCYTESUR NEGATIVE  07/18/2019 0859   Sepsis Labs Invalid input(s): PROCALCITONIN,  WBC,  LACTICIDVEN Microbiology Recent Results (from the past 240 hour(s))  SARS Coronavirus 2 by RT PCR (hospital order, performed in [place] hospital lab) Nasopharyngeal Nasopharyngeal Swab     Status: None   Collection Time: 07/27/19  6:45 AM   Specimen: Nasopharyngeal Swab  Result Value Ref Range Status   SARS Coronavirus 2 NEGATIVE NEGATIVE Final    Comment: (NOTE) SARS-CoV-2 target nucleic acids are NOT DETECTED.  The SARS-CoV-2 RNA is generally detectable in upper and lower respiratory specimens during the acute phase of infection. The lowest concentration of SARS-CoV-2 viral copies this assay can detect is 250 copies / mL. A negative result does not preclude SARS-CoV-2 infection and should not be used as the sole basis for treatment or other patient management decisions.  A negative result may occur with improper specimen collection / handling, submission of specimen other than nasopharyngeal swab, presence of viral mutation(s) within the areas targeted by this assay, and inadequate number of viral copies (<250 copies / mL). A negative result must be combined with clinical observations, patient history, and epidemiological information.  Fact Sheet for Patients:   StrictlyIdeas.no  Fact Sheet for Healthcare Providers: BankingDealers.co.za  This test is not yet approved or  cleared by the [place] FDA and has been authorized for detection and/or diagnosis of SARS-CoV-2 by FDA under an Emergency Use Authorization (EUA).  This EUA will remain in effect (meaning this test can be used) for the duration of the COVID-19 declaration under Section 564(b)(1) of the Act, 21 U.S.C. section 360bbb-3(b)(1), unless the authorization is terminated or revoked sooner.  Performed at [facility], [place] [address] [place], [place] [address]   Culture,  blood (Routine X 2) w Reflex to ID Panel     Status: None (Preliminary result)   Collection Time: 07/27/19 11:24 AM   Specimen: BLOOD RIGHT HAND  Result Value Ref Range Status   Specimen Description   Final    BLOOD RIGHT HAND Performed at [facility] [address], [place], [place] [address]    Special Requests   Final    BOTTLES DRAWN AEROBIC ONLY Blood Culture adequate volume Performed at [facility] [address], [place], [place] [address]    Culture   Final    NO GROWTH < 24 HOURS Performed at [facility] [address], [place], [place] [address]    Report Status PENDING  Incomplete  Culture, blood (Routine X 2)  w Reflex to ID Panel     Status: None (Preliminary result)   Collection Time: 07/27/19 11:29 AM   Specimen: BLOOD LEFT HAND  Result Value Ref Range Status   Specimen Description   Final    BLOOD LEFT HAND Performed at [facility] [address], [place], [place] [address]    Special Requests   Final    BOTTLES DRAWN AEROBIC AND ANAEROBIC Blood Culture adequate volume Performed at [facility] [address], [place], [place] [address]    Culture   Final    NO GROWTH < 24 HOURS Performed at [facility] [address] [place], [place] [address]    Report Status PENDING  Incomplete  MRSA PCR Screening     Status: Abnormal   Collection Time: 07/27/19 11:58 AM   Specimen: Nasal Mucosa; Nasopharyngeal  Result Value Ref Range Status   MRSA by PCR POSITIVE (A) NEGATIVE Final    Comment:        The GeneXpert MRSA Assay (FDA approved for NASAL specimens only), is one component of a comprehensive MRSA colonization surveillance program. It is not intended to diagnose MRSA infection nor to guide or monitor treatment for MRSA infections. RESULT CALLED TO, READ BACK BY AND VERIFIED WITH: J TAYLOR AT 1628 ON 07/27/2019 BY MOSLEY,J Performed at [facility], [place] [address] [place], [place] [address]      Time coordinating discharge: Over 17 minutes    Dwyane Dee, MD  [facility] 07/29/2019, 11:54 AM Pager: Secure chat  If 7PM-7AM, please contact night-coverage [email or web address] Password [email or web address]

## 2019-07-29 NOTE — TOC Initial Note (Signed)
Transition of Care [facility]) - Initial/Assessment Note    Patient Details  Name: William Solis MRN: [id number] Date of Birth: [birth year]  [facility] CM/SW Contact:    Shade Flood, LCSW Phone Number: 07/29/2019, 9:22 AM  Clinical Narrative:                  Pt was recently hospitalized and discharged to SNF a couple days ago. Pt readmitted and at this time, Oncologist indicating that pt is in agreement and is appropriate for transition to hospice care at [facility].   Referral called to [place] at [facility] and she will follow up with pt and family.   Will follow up for dc planning needs when plan established by hospice.  Expected Discharge Plan: [place] Barriers to Discharge: Continued Medical Work up   Patient Goals and CMS Choice        Expected Discharge Plan and Services Expected Discharge Plan: [place] In-house Referral: Clinical Social Work   Post Acute Care Choice: Hospice Living arrangements for the past 2 months: [place] Expected Discharge Date:  (unknown)                                    Prior Living Arrangements/Services Living arrangements for the past 2 months: Single Family Home Lives with:: Spouse Patient language and need for interpreter reviewed:: Yes          Care giver support system in place?: Yes (comment)   Criminal Activity/Legal Involvement Pertinent to Current Situation/Hospitalization: No - Comment as needed  Activities of Daily Living Home Assistive Devices/Equipment: Cane (specify quad or straight), Walker (specify type), Built-in shower seat, Bedside commode/3-in-1, Hand-held shower hose, Wheelchair, CPAP (front wheeled walker) ADL Screening (condition at time of admission) Patient's cognitive ability adequate to safely complete daily activities?: Yes Is the patient deaf or have difficulty hearing?: No Does the patient have difficulty seeing, even when  wearing glasses/contacts?: No Does the patient have difficulty concentrating, remembering, or making decisions?: No Patient able to express need for assistance with ADLs?: Yes Does the patient have difficulty dressing or bathing?: Yes Independently performs ADLs?: No Communication: Independent Dressing (OT): Needs assistance Is this a change from baseline?: Pre-admission baseline Grooming: Needs assistance Is this a change from baseline?: Pre-admission baseline Feeding: Needs assistance Is this a change from baseline?: Pre-admission baseline Bathing: Needs assistance Is this a change from baseline?: Pre-admission baseline Toileting: Needs assistance Is this a change from baseline?: Pre-admission baseline In/Out Bed: Needs assistance Is this a change from baseline?: Pre-admission baseline Walks in Home: Needs assistance Is this a change from baseline?: Pre-admission baseline Does the patient have difficulty walking or climbing stairs?: Yes Weakness of Legs: Both Weakness of Arms/Hands: None  Permission Sought/Granted                  Emotional Assessment       Orientation: : Oriented to Self, Oriented to Place, Oriented to  Time, Oriented to Situation Alcohol / Substance Use: Not Applicable Psych Involvement: No (comment)  Admission diagnosis:  Pulmonary hemorrhage [R04.89] Bilateral pulmonary embolism (HCC) [I26.99] Hemoptysis [R04.2] Acute on chronic respiratory failure with hypoxia (HCC) [J96.21] Malignant neoplasm metastatic to lung, unspecified laterality ([place]) [C78.00] Patient Active Problem List   Diagnosis Date Noted  . Acute on chronic respiratory failure with hypoxia ([place]) 07/27/2019  . Hemoptysis 07/27/2019  . Bilateral pulmonary  embolism ([place]) 07/27/2019  . Cancer with pulmonary metastases ([place]) 07/27/2019  . Bipolar 1 disorder ([place]) 07/27/2019  . Elevated LFTs 07/27/2019  . Weakness generalized   . Malnutrition of moderate degree 07/20/2019  .  Palliative care by specialist   . DNR (do not resuscitate)   . Failure to thrive in adult 07/19/2019  . SDH (subdural hematoma) ([place]) 07/18/2019  . Hepatocellular carcinoma ([place]) 04/25/2019  . Goals of care, counseling/discussion 04/25/2019  . Tremor 09/30/2018  . S/P transmetatarsal amputation of foot, left ([place]) 04/10/2018  . Osteomyelitis of left foot ([place]) 04/03/2018  . Postlaminectomy syndrome 04/01/2017  . Lumbar stenosis 03/09/2017  . Fibromyalgia 09/15/2016  . Risk for falls 06/25/2016  . OSA on CPAP 11/07/2015  . Bilateral edema of lower extremity 10/12/2015  . S/P removal of parathyroid gland ([place]) 12/21/2013  . History of hyperparathyroidism 11/22/2013  . History of osteomyelitis 10/25/2013  . Chronic low back pain 05/12/2012  . Narcotic dependence ([place]) 05/12/2012  . History of hepatitis C 04/19/2010  . Essential hypertension 04/19/2010  . Dry eye syndrome 11/20/2008  . MDD (major depressive disorder) 11/20/2008  . ADD (attention deficit disorder) 12/01/2006   PCP:  Leamon Arnt, MD Pharmacy:   [id number] [place], [place] [address] Phone: [phone] Fax: [phone]  [facility] - [address], [place] [address] Phone: [phone] Fax: [phone]  [place], [place] [address] Phone: [phone] Fax: [phone]     Social Determinants of Health ([place]) Interventions    Readmission Risk Interventions Readmission Risk Prevention Plan 07/29/2019  Transportation Screening Complete  Medication Review (RN Care Manager) [place] Not Applicable  Some recent data might be hidden

## 2019-07-29 NOTE — Progress Notes (Addendum)
HEMATOLOGY-ONCOLOGY PROGRESS NOTE  SUBJECTIVE: No further hemoptysis reported.  Pain overall controlled with current pain medications and dexamethasone.  Awaiting bed offer from [facility].  Oncology History  Hepatocellular carcinoma ([place])  04/25/2019 Initial Diagnosis   Hepatocellular carcinoma ([place])   04/28/2019 - 06/09/2019 Chemotherapy   The patient had atezolizumab (TECENTRIQ) 1,200 mg in sodium chloride 0.9 % 250 mL chemo infusion, 1,200 mg, Intravenous, Once, 3 of 6 cycles Administration: 1,200 mg (04/28/2019), 1,200 mg (05/19/2019), 1,200 mg (06/09/2019) bevacizumab-bvzr (ZIRABEV) 1,400 mg in sodium chloride 0.9 % 100 mL chemo infusion, 15 mg/kg = 1,400 mg, Intravenous,  Once, 3 of 6 cycles Administration: 1,400 mg (04/28/2019), 1,400 mg (05/19/2019), 1,400 mg (06/09/2019)  for chemotherapy treatment.      PHYSICAL EXAMINATION:  Vitals:   07/29/19 0420 07/29/19 0922  BP: 131/87 135/75  Pulse: 80 87  Resp: 20   Temp: 98 F (36.7 C)   SpO2: 99%    Filed Weights   07/27/19 1513  Weight: 86 kg    Intake/Output from previous day: 07/08 0701 - 07/09 0700 In: 0  Out: 300 [Urine:300]  GENERAL:chronically ill appearing, no distress LUNGS: diminished breath sounds HEART: regular rate & rhythm and no murmurs, 1+ BLE edema ABDOMEN: mild distention, no tenderness NEURO: alert & oriented x 3 with fluent speech, no focal motor/sensory deficits  LABORATORY DATA:  I have reviewed the data as listed CMP Latest Ref Rng & Units 07/28/2019 07/27/2019 07/27/2019  Glucose 70 - 99 mg/dL 96 84 81  BUN 8 - 23 mg/dL 28(H) 35(H) 35(H)  Creatinine 0.61 - 1.24 mg/dL 0.74 0.70 0.74  Sodium 135 - 145 mmol/L 134(L) 134(L) 132(L)  Potassium 3.5 - 5.1 mmol/L 5.2(H) 4.9 5.0  Chloride 98 - 111 mmol/L 102 101 99  CO2 22 - 32 mmol/L 24 - 22  Calcium 8.9 - 10.3 mg/dL 8.1(L) - 8.2(L)  Total Protein 6.5 - 8.1 g/dL 6.4(L) - 6.7  Total Bilirubin 0.3 - 1.2 mg/dL 2.0(H) - 2.5(H)  Alkaline Phos 38 - 126 U/L 239(H)  - 282(H)  AST 15 - 41 U/L 175(H) - 227(H)  ALT 0 - 44 U/L 180(H) - 203(H)    Lab Results  Component Value Date   WBC 12.7 (H) 07/28/2019   HGB 14.2 07/28/2019   HCT 48.7 07/28/2019   MCV 82.4 07/28/2019   PLT 106 (L) 07/28/2019   NEUTROABS 11.3 (H) 07/27/2019    CT HEAD WO CONTRAST  Result Date: 07/18/2019 CLINICAL DATA:  Subdural hematoma, follow-up EXAM: CT HEAD WITHOUT CONTRAST TECHNIQUE: Contiguous axial images were obtained from the base of the skull through the vertex without intravenous contrast. COMPARISON:  07/18/2019 FINDINGS: Brain: Small subdural hematoma along the posterior falx measuring 4 mm compared to 6 mm previously. Blood extends along the right side of the tentorium. No mass effect or midline shift. No new areas of hemorrhage. Vascular: No hyperdense vessel or unexpected calcification. Skull: No acute calvarial abnormality. Sinuses/Orbits: Visualized paranasal sinuses and mastoids clear. Orbital soft tissues unremarkable. Other: None IMPRESSION: Small subdural hematoma along the posterior falx and right tentorium, slightly decreased in size since prior study. Electronically Signed   By: Rolm Baptise M.D.   On: 07/18/2019 15:11   CT HEAD WO CONTRAST  Result Date: 07/18/2019 CLINICAL DATA:  [place], hit head, anticoagulated, hepatocellular carcinoma EXAM: CT HEAD WITHOUT CONTRAST TECHNIQUE: Contiguous axial images were obtained from the base of the skull through the vertex without intravenous contrast. COMPARISON:  None. FINDINGS: Brain: There is a  posterior falcine subdural hematoma measuring up to 6 mm. The subdural extends along the right side tentorium. No mass effect. No acute infarct. Lateral ventricles and remaining midline structures are unremarkable. Vascular: No hyperdense vessel or unexpected calcification. Skull: Normal. Negative for fracture or focal lesion. Sinuses/Orbits: Minimal fluid within the left maxillary sinus. Remaining paranasal sinuses are clear. Other:  None. IMPRESSION: 1. Small posterior falcine subdural hematoma extending along the right side tentorium. Maximal thickness measures 6 mm. No mass effect. These results were called by telephone at the time of interpretation on 07/18/2019 at 2:20 am to provider PA Quincy Carnes, who verbally acknowledged these results. Electronically Signed   By: Randa Ngo M.D.   On: 07/18/2019 02:21   CT Angio Chest PE W and/or Wo Contrast  Result Date: 07/27/2019 CLINICAL DATA:  Metastatic hepatocellular carcinoma, shortness of breath, hemoptysis, known pulmonary metastases EXAM: CT ANGIOGRAPHY CHEST WITH CONTRAST TECHNIQUE: Multidetector CT imaging of the chest was performed using the standard protocol during bolus administration of intravenous contrast. Multiplanar CT image reconstructions and MIPs were obtained to evaluate the vascular anatomy. CONTRAST:  113mL OMNIPAQUE IOHEXOL 350 MG/ML SOLN COMPARISON:  06/28/2019 FINDINGS: Cardiovascular: Branching hypodense filling defects noted in the pulmonary arteries extending into both upper lobes as well as the lingula and left lower lobe, compatible with acute bilateral pulmonary emboli. No significant central or saddle embolus. Pulmonary emboli are predominately segmental in distribution. RV to LV ratio is 0.82.  Negative for CT evidence of heart strain. Intact thoracic aorta. Three vessel arch anatomy. No aneurysm or dissection. No mediastinal hemorrhage or hematoma. Normal heart size. Mitral valve calcifications present. No pericardial effusion. Mediastinum/Nodes: Thyroid, trachea and esophagus unremarkable. Trachea and central airways appear patent. Esophagus nondilated. No hiatal hernia. Calcified subcarinal and right infrahilar lymph nodes as before. Lungs/Pleura: Extensive innumerable bilateral pulmonary metastases throughout all lobes of both lungs. These metastases appear increased in both size and number. Index central right upper lobe lesion measures 1.8 cm,  previously 1.6 cm, image 61 series 8. Posterior right upper lobe subpleural patchy airspace opacities as well as bibasilar dependent atelectasis. Basilar atelectasis has increased. Chronic calcified right lower lobe granuloma noted. No pleural effusion, additional pleural abnormality, or pneumothorax. Upper Abdomen: Ascites noted in the upper abdomen as well as extensive known bulky multifocal hepatic masses compatible with hepatocellular carcinoma. Liver is only partially imaged by chest CTA. Splenic punctate granulomata also noted. Musculoskeletal: No acute or significant osseous finding. No compression fracture. Sternum intact. Review of the MIP images confirms the above findings. IMPRESSION: Positive exam for bilateral segmental pulmonary emboli as above without evidence of heart strain. Progression of innumerable bilateral pulmonary metastases, increased in both size and number. Posterior right upper lobe subpleural patchy airspace disease/consolidation may represent pneumonia, aspiration or pulmonary hemorrhage. Increased dependent bibasilar atelectasis. Known bulky multifocal hepatic lesions compatible with hepatocellular carcinoma. Small amount of associated upper abdominal ascites. Aortic Atherosclerosis (ICD10-I70.0). Electronically Signed   By: Jerilynn Mages.  Shick M.D.   On: 07/27/2019 08:24   DG Chest Portable 1 View  Result Date: 07/27/2019 CLINICAL DATA:  Multifocal and metastatic hepatocellular carcinoma. Hemoptysis. EXAM: PORTABLE CHEST 1 VIEW COMPARISON:  05/02/2019 FINDINGS: Innumerable pulmonary nodules are scattered throughout the lungs. There is some hazy opacities in both lung basis on the right mid lung which could reflect superimposed multilobar pneumonia or pulmonary hemorrhage. Heart size within normal limits. Degenerative glenohumeral arthropathy on the left. IMPRESSION: 1. Innumerable pulmonary nodules compatible with metastatic disease. 2. Hazy opacities in the right  mid lung, potentially from  multilobar pneumonia or pulmonary hemorrhage. Electronically Signed   By: Van Clines M.D.   On: 07/27/2019 06:08   VAS [place] LOWER EXTREMITY VENOUS (DVT)  Result Date: 07/27/2019  Lower Venous DVTStudy Indications: Pulmonary embolism.  Comparison Study: No prior study Performing Technologist: Maudry Mayhew MHA, RDMS, RVT, RDCS  Examination Guidelines: A complete evaluation includes B-mode imaging, spectral Doppler, color Doppler, and power Doppler as needed of all accessible portions of each vessel. Bilateral testing is considered an integral part of a complete examination. Limited examinations for reoccurring indications may be performed as noted. The reflux portion of the exam is performed with the patient in reverse Trendelenburg.  +---------+---------------+---------+-----------+----------+--------------+ RIGHT    CompressibilityPhasicitySpontaneityPropertiesThrombus Aging +---------+---------------+---------+-----------+----------+--------------+ CFV      Full           Yes      Yes                                 +---------+---------------+---------+-----------+----------+--------------+ SFJ      Full                                                        +---------+---------------+---------+-----------+----------+--------------+ FV Prox  Full                                                        +---------+---------------+---------+-----------+----------+--------------+ FV Mid   Full                                                        +---------+---------------+---------+-----------+----------+--------------+ FV DistalFull                                                        +---------+---------------+---------+-----------+----------+--------------+ PFV      Full                                                        +---------+---------------+---------+-----------+----------+--------------+ POP      Full           Yes      Yes                                  +---------+---------------+---------+-----------+----------+--------------+ PTV      Full                                                        +---------+---------------+---------+-----------+----------+--------------+  Right Technical Findings: Not visualized segments include peroneal veins.  +---------+---------------+---------+-----------+----------+--------------+ LEFT     CompressibilityPhasicitySpontaneityPropertiesThrombus Aging +---------+---------------+---------+-----------+----------+--------------+ CFV      Full           Yes      Yes                                 +---------+---------------+---------+-----------+----------+--------------+ SFJ      Full                                                        +---------+---------------+---------+-----------+----------+--------------+ FV Prox  Full                                                        +---------+---------------+---------+-----------+----------+--------------+ FV Mid   Full                                                        +---------+---------------+---------+-----------+----------+--------------+ FV DistalFull                                                        +---------+---------------+---------+-----------+----------+--------------+ PFV      Full                                                        +---------+---------------+---------+-----------+----------+--------------+ POP      Full           Yes      Yes                                 +---------+---------------+---------+-----------+----------+--------------+ PTV      Full                                                        +---------+---------------+---------+-----------+----------+--------------+   Left Technical Findings: Not visualized segments include peroneal veins.   Summary: RIGHT: - There is no evidence of deep vein thrombosis in the lower extremity. However,  portions of this examination were limited- see technologist comments above.  - No cystic structure found in the popliteal fossa.  LEFT: - There is no evidence of deep vein thrombosis in the lower extremity. However, portions of this examination were limited- see technologist comments above.  - No cystic structure found in the popliteal fossa.  *See table(s) above for measurements and observations. Electronically signed by Harold Barban  MD on 07/27/2019 at 5:10:15 PM.    Final     ASSESSMENT AND PLAN: 1. Hepatocellular carcinoma  Ultrasound abdomen 04/11/1999-2 solid hepatic masses, largest measuring 15 cm  MRI liver 04/12/2019-cirrhosis, dominant central hepatic mass, dominant segment 6 mass, several small enhancing lesions in the bilateral liver, imaging features consistent with LR-5 criteria  Cycle 1 atezolizumab/Avastin 04/28/2019  CT chest 05/02/2019-segmental pulmonary emboli within the right upper and left lower lobes. Innumerable bilateral pulmonary nodules.  CT abdomen/pelvis 05/02/2019-stable multifocal hepatocellular carcinoma, stable portacaval adenopathy  Cycle 2 atezolizumab/Avastin 05/19/2019  Cycle 3 atezolizumab/Avastin 06/09/2019  CTs 06/28/2019-interval enlargement of bulky liver masses. Multiple new and enlarged much smaller lesions throughout the liver. Innumerable new and enlarged pulmonary nodules.  Lenvatinib started 07/14/2019 2. History of hepatitis C, treatedin 2019 3. Depression 4. ADHD 5. Lower extremity neuropathy 6. Hypertension 7. Status post partial left foot amputation 8. Chest CT 05/02/2019-segmental pulmonary emboli within the right upper and left lower lobes, minimal clot burden no evidence of right heart strain. Started on Xarelto, discontinued on hospital admission 07/18/2019 9. Hospital admission 07/18/2019 for subdural hematoma following a fall 10. Hospital admission 07/27/2019-hemoptysis  William Solis has had disease progression and is now planning to  discharge to residential hospice.  Pain overall well controlled on current pain medications and dexamethasone.  Recommendations: 1.  Await bed offer from [facility] 2.  Continue oxycodone for pain.  Consider transitioning to OxyFast if difficulty swallowing. 3.  Recommend decreasing dexamethasone to 4 mg twice daily upon discharge to [facility].   LOS: 2 days   Mikey Bussing, DNP, AGPCNP-BC, AOCNP 07/29/19 William Solis was interviewed and examined.  He appeared more comfortable when I saw him last night and again this morning.  He is waiting on transfer to [facility].  I would continue Decadron for palliation of cough and dyspnea.  I will plan on seeing him back [facility].  Please call oncology as needed.

## 2019-07-29 NOTE — Patient Outreach (Signed)
[facility]) Care Management  07/29/2019  YUJI WALTH [birth year] [id number]     Transition of Care Referral  Referral Date: 07/29/2019 Referral Source: [facility] Discharge Report Date of Discharge: 07/27/2019 Facility: [place]: [facility] Medicare   Referral received upon chart review noted that patient currently inpatient.    Plan: RN CM will close case at this time.    Enzo Montgomery, RN,BSN,CCM [place] Management Telephonic Care Management Coordinator Direct Phone: [phone] Toll Free: [phone] Fax: [phone]

## 2019-07-29 NOTE — Progress Notes (Signed)
Manufacturing engineer [facility]) Hospital Liaison note.   Received request from [place] for family interest in [facility]. Chart being reviewed at this time; eligibility pending. Spoke with spouse Santiago Glad to confirm interest and explain services. Family agreeable to transfer today pending approval. TOC aware.    [facility] will notify TOC when registration paperwork has been completed to arrange transport.   RN please call report to [phone].  Thank you for the opportunity to participate in this patient's care.  Domenic Moras, BSN, RN [place] (listed on [place] [facility])    [phone]

## 2019-07-29 NOTE — TOC Transition Note (Signed)
Transition of Care [facility]) - CM/SW Discharge Note   Patient Details  Name: William Solis MRN: [id number] Date of Birth: [birth year]  [facility] CM/SW Contact:  Shade Flood, LCSW Phone Number: 07/29/2019, 10:53 AM   Clinical Narrative:     Pt approved for bed at [facility] and they can accept him today. Updated MD who will work on DC orders.   DC envelope will be placed at the RN station. RN to call report to [phone].  Will arrange for PTAR when RN ready.  There are no other TOC needs for dc.  Final next level of care: [place] Barriers to Discharge: Barriers Resolved   Patient Goals and CMS Choice        Discharge Placement                       Discharge Plan and Services In-house Referral: Clinical Social Work   Post Acute Care Choice: Hospice                               Social Determinants of Health (SDOH) Interventions     Readmission Risk Interventions Readmission Risk Prevention Plan 07/29/2019  Transportation Screening Complete  Medication Review Press photographer) [place] Not Applicable  Some recent data might be hidden

## 2019-07-29 NOTE — Progress Notes (Signed)
Patient discharged to [facility] via ambulance, patient alert and in no distress, pain medication given prior to transportation.

## 2019-08-01 LAB — CULTURE, BLOOD (ROUTINE X 2)
Culture: NO GROWTH
Culture: NO GROWTH
Special Requests: ADEQUATE
Special Requests: ADEQUATE

## 2019-08-21 ENCOUNTER — Other Ambulatory Visit: Payer: Self-pay | Admitting: Nurse Practitioner

## 2019-08-21 DIAGNOSIS — C220 Liver cell carcinoma: Secondary | ICD-10-CM

## 2019-08-21 DEATH — deceased

## 2019-08-25 ENCOUNTER — Telehealth: Payer: Self-pay | Admitting: Family Medicine

## 2019-08-25 NOTE — Telephone Encounter (Signed)
Patient's wife called in to notify Dr.Andy about Karma's passing last month.

## 2019-08-25 NOTE — Telephone Encounter (Signed)
Sympathy card placed on Dr. Tamela Oddi desk for her to sign.

## 2019-10-04 ENCOUNTER — Ambulatory Visit: Payer: Medicare HMO | Admitting: Family Medicine

## 2021-09-23 IMAGING — CT CT HEAD W/O CM
3 series · 15 of 47 positions shown, 18 images · non-contrast
Comparison: None.

CLINICAL DATA: Fell, hit head, anticoagulated, hepatocellular
carcinoma

EXAM:
CT HEAD WITHOUT CONTRAST
TECHNIQUE: Contiguous axial images were obtained from the base of the skull
through the vertex without intravenous contrast.

[Series 2: head wo · axial · 0.43mm/px · z∈[-121,+4]mm · 9 of 31 slices shown, 12 images]
[im 3/31  brain]
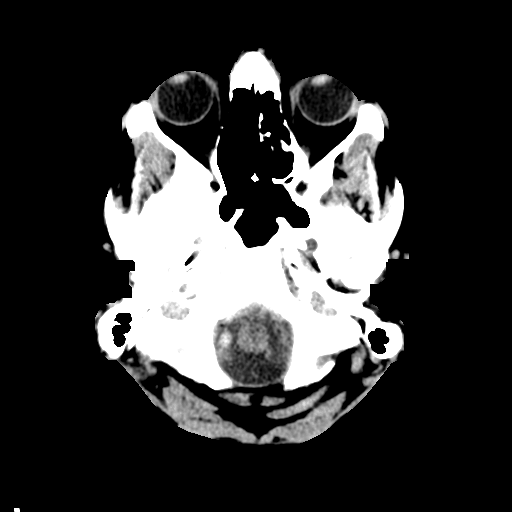
[im 3/31  bone]
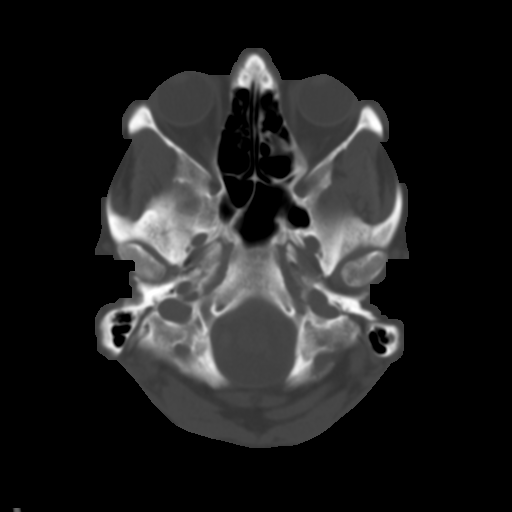
[im 6/31  brain]
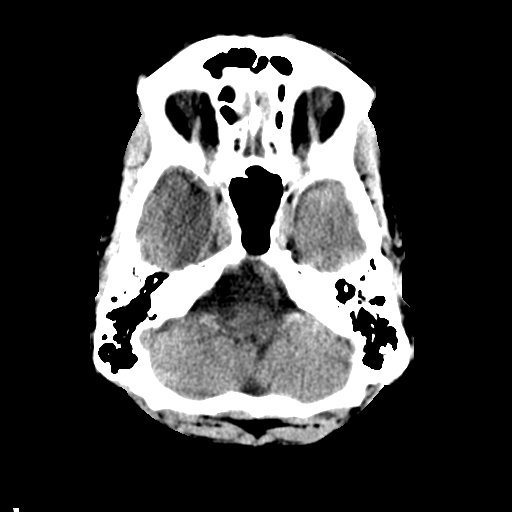
[im 9/31  brain]
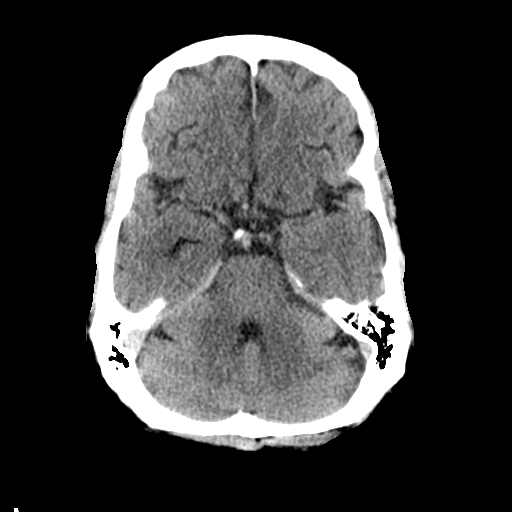
[im 12/31  brain]
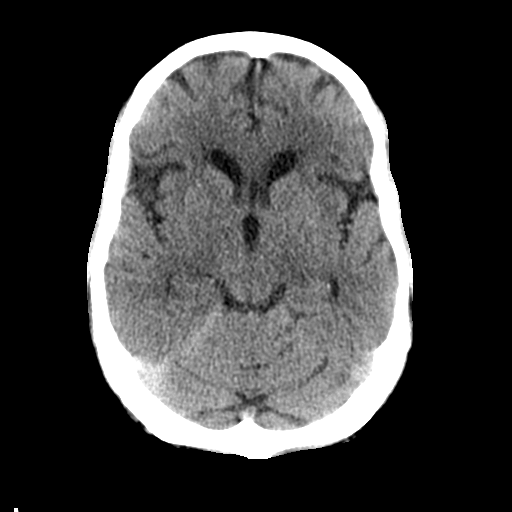
[im 16/31  brain]
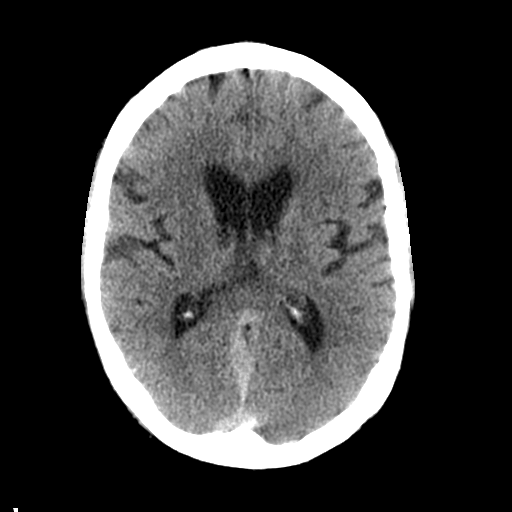
[im 16/31  bone]
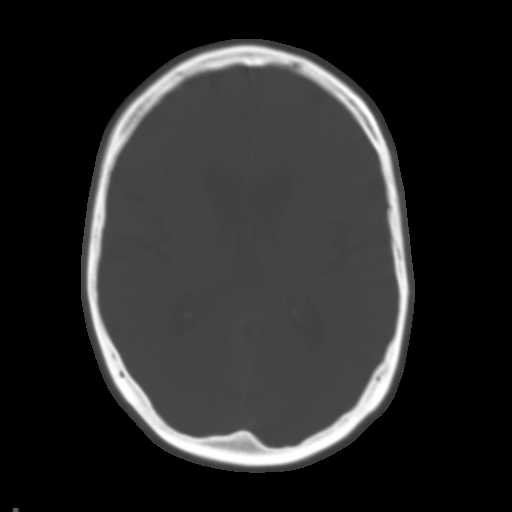
[im 19/31  brain]
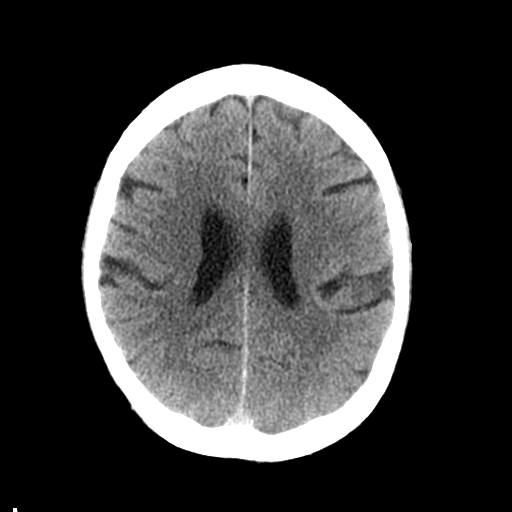
[im 22/31  brain]
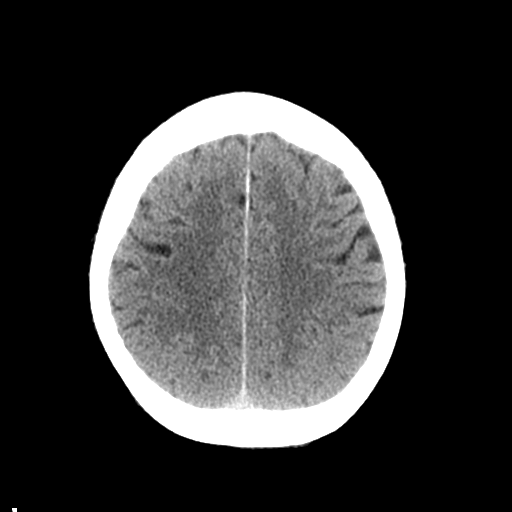
[im 25/31  brain]
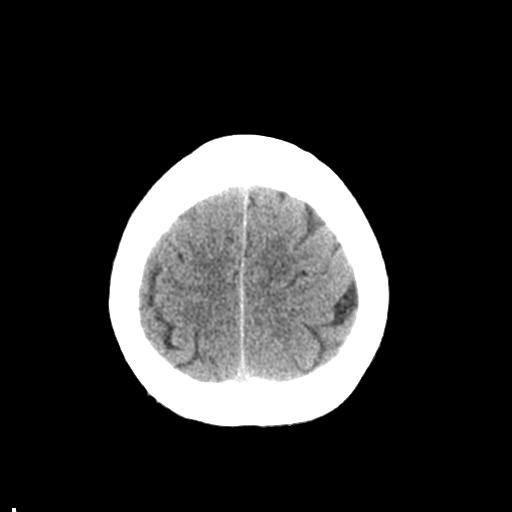
[im 28/31  brain]
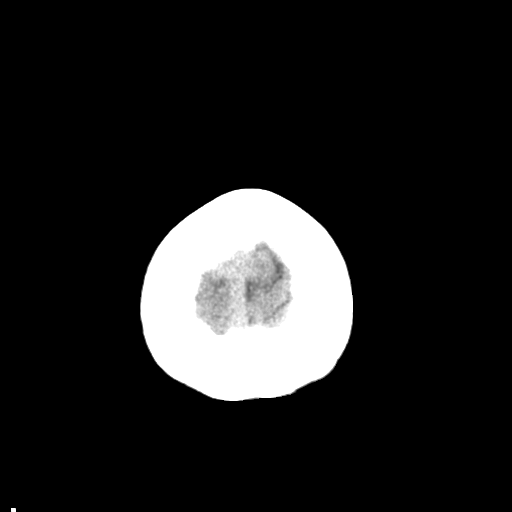
[im 28/31  bone]
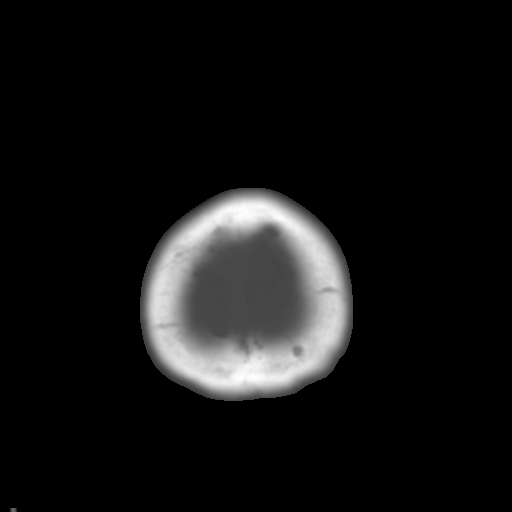

[Series 5: coronal soft tissue · coronal · 0.33mm/px · 3 of 71 slices shown]
[im 24/71  brain]
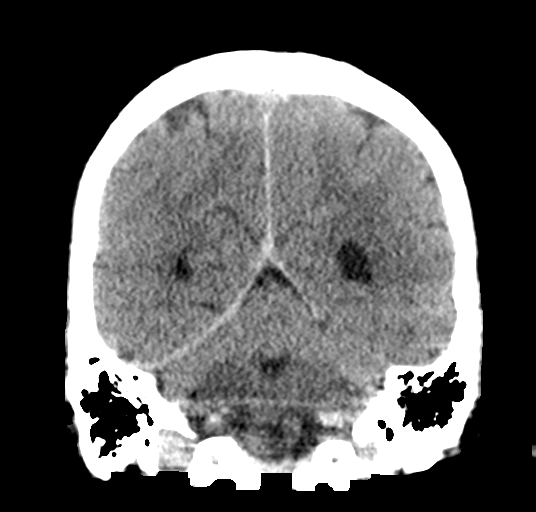
[im 32/71  brain]
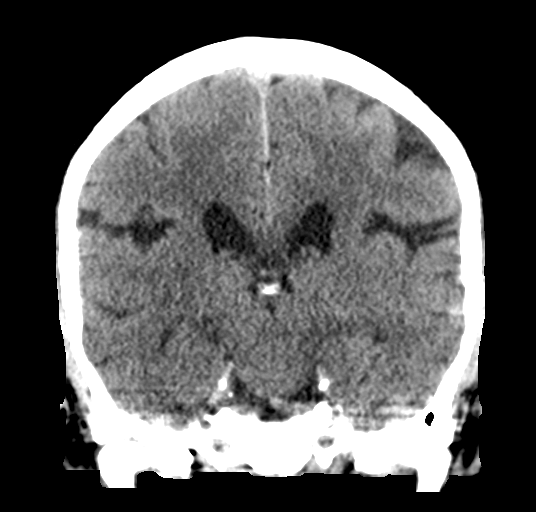
[im 39/71  brain]
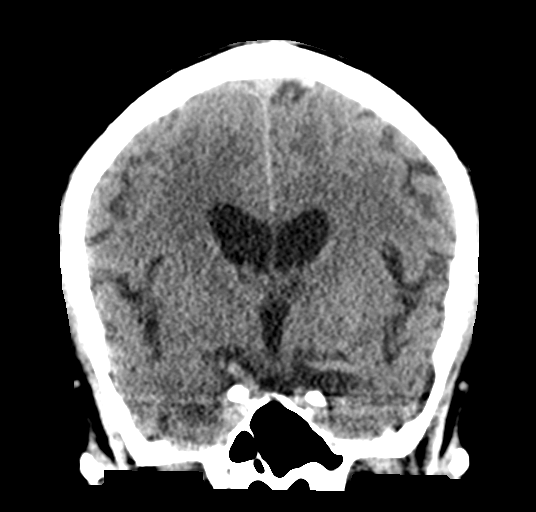

[Series 6: sagittal soft tissue · sagittal · 0.32mm/px · 3 of 54 slices shown]
[im 18/54  brain]
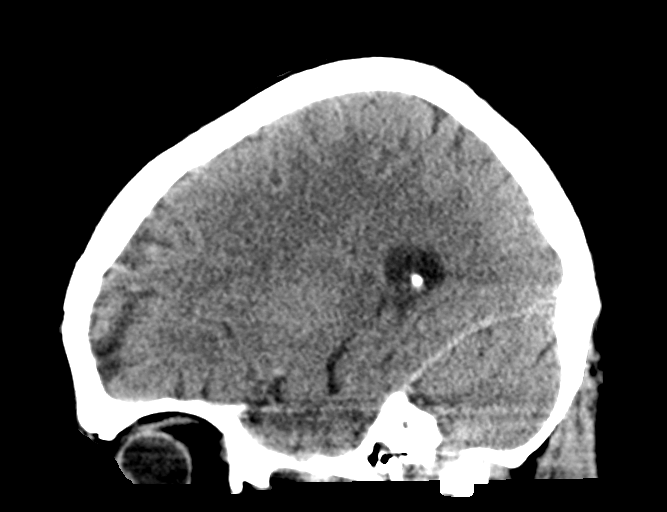
[im 27/54  brain]
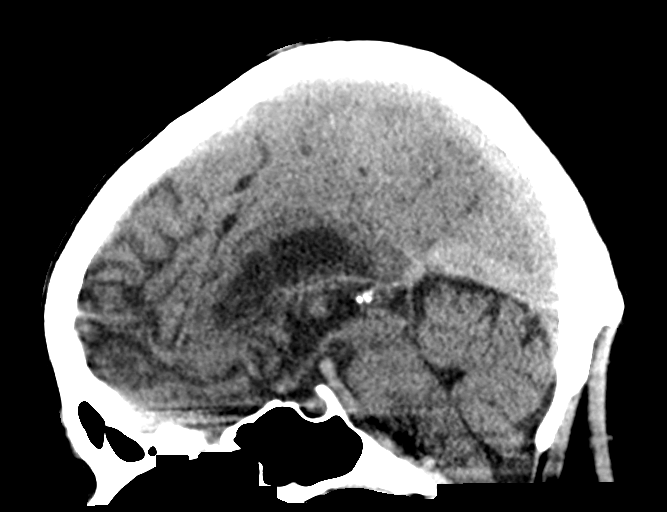
[im 36/54  brain]
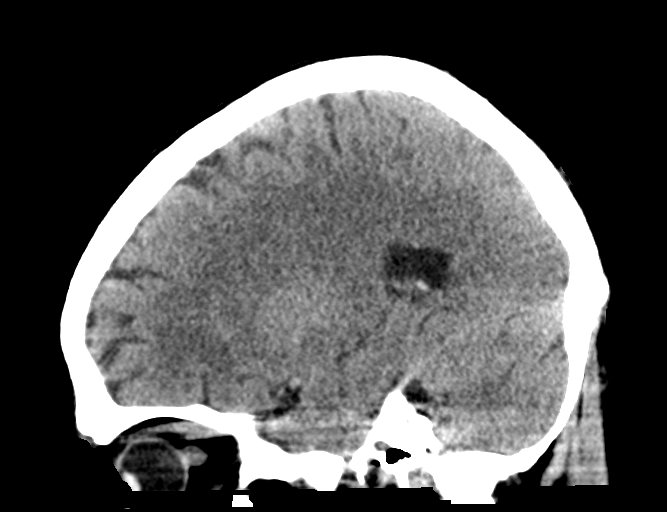

[15 of 47 positions shown; findings below may reference images not displayed]

FINDINGS: Brain: There is a posterior falcine subdural hematoma measuring up
to 6 mm. The subdural extends along the right side tentorium. No
mass effect.

No acute infarct. Lateral ventricles and remaining midline
structures are unremarkable.

Vascular: No hyperdense vessel or unexpected calcification.

Skull: Normal. Negative for fracture or focal lesion.

Sinuses/Orbits: Minimal fluid within the left maxillary sinus.
Remaining paranasal sinuses are clear.

Other: None.
IMPRESSION: 1. Small posterior falcine subdural hematoma extending along the
right side tentorium. Maximal thickness measures 6 mm. No mass
effect.

These results were called by telephone at the time of interpretation
on 07/18/2019 at [DATE] to provider PA Bejinari Perdreau, who verbally
acknowledged these results.

## 2022-11-04 NOTE — Telephone Encounter (Signed)
TC
